# Patient Record
Sex: Female | Born: 1955 | Race: Black or African American | Hispanic: No | Marital: Single | Smoking: Former smoker
Health system: Southern US, Community
[De-identification: ages and names within clinical notes are randomized; demographics above are authoritative.]

## PROBLEM LIST (undated history)

## (undated) DIAGNOSIS — D649 Anemia, unspecified: Secondary | ICD-10-CM

## (undated) DIAGNOSIS — I739 Peripheral vascular disease, unspecified: Secondary | ICD-10-CM

## (undated) DIAGNOSIS — J45909 Unspecified asthma, uncomplicated: Secondary | ICD-10-CM

## (undated) DIAGNOSIS — G4730 Sleep apnea, unspecified: Secondary | ICD-10-CM

## (undated) DIAGNOSIS — K219 Gastro-esophageal reflux disease without esophagitis: Secondary | ICD-10-CM

## (undated) DIAGNOSIS — L309 Dermatitis, unspecified: Secondary | ICD-10-CM

## (undated) DIAGNOSIS — E1169 Type 2 diabetes mellitus with other specified complication: Secondary | ICD-10-CM

## (undated) DIAGNOSIS — E785 Hyperlipidemia, unspecified: Secondary | ICD-10-CM

## (undated) DIAGNOSIS — Z8719 Personal history of other diseases of the digestive system: Secondary | ICD-10-CM

## (undated) DIAGNOSIS — M1990 Unspecified osteoarthritis, unspecified site: Secondary | ICD-10-CM

## (undated) DIAGNOSIS — N183 Chronic kidney disease, stage 3 unspecified: Secondary | ICD-10-CM

## (undated) DIAGNOSIS — E1122 Type 2 diabetes mellitus with diabetic chronic kidney disease: Secondary | ICD-10-CM

## (undated) DIAGNOSIS — I10 Essential (primary) hypertension: Secondary | ICD-10-CM

## (undated) DIAGNOSIS — Z9641 Presence of insulin pump (external) (internal): Secondary | ICD-10-CM

## (undated) HISTORY — DX: Unspecified asthma, uncomplicated: J45.909

## (undated) HISTORY — DX: Type 2 diabetes mellitus with diabetic chronic kidney disease: E11.22

## (undated) HISTORY — DX: Chronic kidney disease, stage 3 unspecified: N18.30

## (undated) HISTORY — PX: CARDIAC CATHETERIZATION: SHX172

## (undated) HISTORY — DX: Peripheral vascular disease, unspecified: I73.9

## (undated) HISTORY — DX: Type 2 diabetes mellitus with other specified complication: E78.5

## (undated) HISTORY — PX: REPLACEMENT TOTAL KNEE: SUR1224

## (undated) HISTORY — DX: Type 2 diabetes mellitus with other specified complication: E11.69

## (undated) HISTORY — DX: Essential (primary) hypertension: I10

## (undated) HISTORY — PX: TONSILLECTOMY: SUR1361

## (undated) HISTORY — DX: Dermatitis, unspecified: L30.9

---
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
## 1987-02-25 HISTORY — PX: TOTAL ABDOMINAL HYSTERECTOMY: SHX209

## 1995-02-25 HISTORY — PX: CHOLECYSTECTOMY: SHX55

## 1998-03-23 ENCOUNTER — Emergency Department (HOSPITAL_COMMUNITY): Admission: EM | Admit: 1998-03-23 | Discharge: 1998-03-23 | Payer: Self-pay | Admitting: Emergency Medicine

## 1998-07-30 ENCOUNTER — Emergency Department (HOSPITAL_COMMUNITY): Admission: EM | Admit: 1998-07-30 | Discharge: 1998-07-30 | Payer: Self-pay | Admitting: Emergency Medicine

## 1998-09-28 ENCOUNTER — Encounter: Payer: Self-pay | Admitting: General Surgery

## 1998-09-28 ENCOUNTER — Observation Stay (HOSPITAL_COMMUNITY): Admission: RE | Admit: 1998-09-28 | Discharge: 1998-09-29 | Payer: Self-pay | Admitting: General Surgery

## 1999-04-24 ENCOUNTER — Encounter: Admission: RE | Admit: 1999-04-24 | Discharge: 1999-04-24 | Payer: Self-pay | Admitting: Family Medicine

## 1999-04-24 ENCOUNTER — Encounter: Payer: Self-pay | Admitting: Family Medicine

## 1999-04-29 ENCOUNTER — Other Ambulatory Visit: Admission: RE | Admit: 1999-04-29 | Discharge: 1999-04-29 | Payer: Self-pay | Admitting: Family Medicine

## 1999-10-17 ENCOUNTER — Encounter: Payer: Self-pay | Admitting: Family Medicine

## 1999-10-17 ENCOUNTER — Encounter: Admission: RE | Admit: 1999-10-17 | Discharge: 1999-10-17 | Payer: Self-pay | Admitting: Family Medicine

## 2004-10-25 ENCOUNTER — Emergency Department (HOSPITAL_COMMUNITY): Admission: EM | Admit: 2004-10-25 | Discharge: 2004-10-26 | Payer: Self-pay | Admitting: Emergency Medicine

## 2006-02-24 DIAGNOSIS — I519 Heart disease, unspecified: Secondary | ICD-10-CM

## 2006-02-24 DIAGNOSIS — I2510 Atherosclerotic heart disease of native coronary artery without angina pectoris: Secondary | ICD-10-CM

## 2006-02-24 DIAGNOSIS — I219 Acute myocardial infarction, unspecified: Secondary | ICD-10-CM

## 2006-02-24 HISTORY — DX: Heart disease, unspecified: I51.9

## 2006-02-24 HISTORY — PX: CORONARY STENT INTERVENTION: CATH118234

## 2006-02-24 HISTORY — DX: Atherosclerotic heart disease of native coronary artery without angina pectoris: I25.10

## 2006-02-24 HISTORY — DX: Acute myocardial infarction, unspecified: I21.9

## 2008-02-25 DIAGNOSIS — E119 Type 2 diabetes mellitus without complications: Secondary | ICD-10-CM

## 2008-02-25 HISTORY — DX: Type 2 diabetes mellitus without complications: E11.9

## 2008-02-25 HISTORY — PX: LUMBAR SPINE SURGERY: SHX701

## 2009-06-26 DIAGNOSIS — M519 Unspecified thoracic, thoracolumbar and lumbosacral intervertebral disc disorder: Secondary | ICD-10-CM | POA: Insufficient documentation

## 2011-01-20 DIAGNOSIS — I10 Essential (primary) hypertension: Secondary | ICD-10-CM | POA: Insufficient documentation

## 2011-01-20 HISTORY — DX: Essential (primary) hypertension: I10

## 2011-02-25 HISTORY — PX: CORONARY BALLOON ANGIOPLASTY: CATH118233

## 2011-03-31 DIAGNOSIS — F411 Generalized anxiety disorder: Secondary | ICD-10-CM | POA: Insufficient documentation

## 2011-09-10 DIAGNOSIS — E119 Type 2 diabetes mellitus without complications: Secondary | ICD-10-CM | POA: Insufficient documentation

## 2011-09-10 DIAGNOSIS — N1832 Chronic kidney disease, stage 3b: Secondary | ICD-10-CM | POA: Insufficient documentation

## 2012-09-09 DIAGNOSIS — E1149 Type 2 diabetes mellitus with other diabetic neurological complication: Secondary | ICD-10-CM | POA: Insufficient documentation

## 2013-02-24 DIAGNOSIS — I219 Acute myocardial infarction, unspecified: Secondary | ICD-10-CM

## 2013-02-24 HISTORY — DX: Acute myocardial infarction, unspecified: I21.9

## 2014-10-23 DIAGNOSIS — R059 Cough, unspecified: Secondary | ICD-10-CM | POA: Insufficient documentation

## 2015-02-01 DIAGNOSIS — I739 Peripheral vascular disease, unspecified: Secondary | ICD-10-CM | POA: Insufficient documentation

## 2015-02-25 HISTORY — PX: CARPAL TUNNEL RELEASE: SHX101

## 2015-03-12 DIAGNOSIS — G4733 Obstructive sleep apnea (adult) (pediatric): Secondary | ICD-10-CM | POA: Insufficient documentation

## 2015-07-06 DIAGNOSIS — R0609 Other forms of dyspnea: Secondary | ICD-10-CM | POA: Insufficient documentation

## 2015-07-06 DIAGNOSIS — R0602 Shortness of breath: Secondary | ICD-10-CM | POA: Insufficient documentation

## 2015-08-21 DIAGNOSIS — J4520 Mild intermittent asthma, uncomplicated: Secondary | ICD-10-CM | POA: Insufficient documentation

## 2015-10-12 DIAGNOSIS — K219 Gastro-esophageal reflux disease without esophagitis: Secondary | ICD-10-CM | POA: Insufficient documentation

## 2015-10-12 DIAGNOSIS — K449 Diaphragmatic hernia without obstruction or gangrene: Secondary | ICD-10-CM | POA: Insufficient documentation

## 2015-10-14 DIAGNOSIS — D3502 Benign neoplasm of left adrenal gland: Secondary | ICD-10-CM | POA: Insufficient documentation

## 2016-03-20 DIAGNOSIS — K30 Functional dyspepsia: Secondary | ICD-10-CM | POA: Insufficient documentation

## 2016-05-23 DIAGNOSIS — N183 Chronic kidney disease, stage 3 unspecified: Secondary | ICD-10-CM | POA: Insufficient documentation

## 2016-05-23 DIAGNOSIS — N179 Acute kidney failure, unspecified: Secondary | ICD-10-CM | POA: Insufficient documentation

## 2016-08-06 DIAGNOSIS — M5416 Radiculopathy, lumbar region: Secondary | ICD-10-CM | POA: Insufficient documentation

## 2016-10-08 DIAGNOSIS — R079 Chest pain, unspecified: Secondary | ICD-10-CM | POA: Insufficient documentation

## 2016-10-08 DIAGNOSIS — R072 Precordial pain: Secondary | ICD-10-CM | POA: Insufficient documentation

## 2016-12-29 DIAGNOSIS — M7542 Impingement syndrome of left shoulder: Secondary | ICD-10-CM | POA: Insufficient documentation

## 2017-01-01 DIAGNOSIS — I5032 Chronic diastolic (congestive) heart failure: Secondary | ICD-10-CM | POA: Insufficient documentation

## 2017-01-07 DIAGNOSIS — M159 Polyosteoarthritis, unspecified: Secondary | ICD-10-CM | POA: Insufficient documentation

## 2017-01-07 DIAGNOSIS — R5382 Chronic fatigue, unspecified: Secondary | ICD-10-CM | POA: Insufficient documentation

## 2017-02-24 DIAGNOSIS — I509 Heart failure, unspecified: Secondary | ICD-10-CM

## 2017-02-24 DIAGNOSIS — I5032 Chronic diastolic (congestive) heart failure: Secondary | ICD-10-CM

## 2017-02-24 HISTORY — DX: Chronic diastolic (congestive) heart failure: I50.32

## 2017-02-24 HISTORY — DX: Heart failure, unspecified: I50.9

## 2017-09-02 DIAGNOSIS — K909 Intestinal malabsorption, unspecified: Secondary | ICD-10-CM | POA: Insufficient documentation

## 2017-09-02 DIAGNOSIS — R197 Diarrhea, unspecified: Secondary | ICD-10-CM | POA: Insufficient documentation

## 2017-10-11 DIAGNOSIS — G5601 Carpal tunnel syndrome, right upper limb: Secondary | ICD-10-CM | POA: Insufficient documentation

## 2017-12-22 HISTORY — PX: LEFT HEART CATH AND CORONARY ANGIOGRAPHY: CATH118249

## 2017-12-22 HISTORY — PX: LOWER EXTREMITY ANGIOGRAM: SHX5955

## 2017-12-22 HISTORY — PX: LOWER EXTREMITY INTERVENTION: CATH118252

## 2018-04-25 DIAGNOSIS — Z96642 Presence of left artificial hip joint: Secondary | ICD-10-CM

## 2018-04-25 HISTORY — PX: TOTAL HIP ARTHROPLASTY: SHX124

## 2018-04-25 HISTORY — DX: Presence of left artificial hip joint: Z96.642

## 2018-05-05 DIAGNOSIS — M1612 Unilateral primary osteoarthritis, left hip: Secondary | ICD-10-CM | POA: Insufficient documentation

## 2018-05-06 DIAGNOSIS — Z96642 Presence of left artificial hip joint: Secondary | ICD-10-CM | POA: Insufficient documentation

## 2018-05-11 DIAGNOSIS — I25118 Atherosclerotic heart disease of native coronary artery with other forms of angina pectoris: Secondary | ICD-10-CM | POA: Insufficient documentation

## 2018-05-11 DIAGNOSIS — E785 Hyperlipidemia, unspecified: Secondary | ICD-10-CM | POA: Insufficient documentation

## 2018-05-11 DIAGNOSIS — I2510 Atherosclerotic heart disease of native coronary artery without angina pectoris: Secondary | ICD-10-CM | POA: Insufficient documentation

## 2018-05-11 DIAGNOSIS — N182 Chronic kidney disease, stage 2 (mild): Secondary | ICD-10-CM | POA: Insufficient documentation

## 2018-05-11 DIAGNOSIS — E1169 Type 2 diabetes mellitus with other specified complication: Secondary | ICD-10-CM | POA: Insufficient documentation

## 2018-05-11 DIAGNOSIS — J45909 Unspecified asthma, uncomplicated: Secondary | ICD-10-CM | POA: Insufficient documentation

## 2018-05-11 DIAGNOSIS — J189 Pneumonia, unspecified organism: Secondary | ICD-10-CM | POA: Insufficient documentation

## 2018-07-01 DIAGNOSIS — R110 Nausea: Secondary | ICD-10-CM | POA: Insufficient documentation

## 2018-12-29 DIAGNOSIS — D519 Vitamin B12 deficiency anemia, unspecified: Secondary | ICD-10-CM | POA: Insufficient documentation

## 2018-12-29 DIAGNOSIS — D509 Iron deficiency anemia, unspecified: Secondary | ICD-10-CM | POA: Insufficient documentation

## 2019-02-25 DIAGNOSIS — J449 Chronic obstructive pulmonary disease, unspecified: Secondary | ICD-10-CM

## 2019-02-25 HISTORY — DX: Chronic obstructive pulmonary disease, unspecified: J44.9

## 2019-03-28 DIAGNOSIS — R490 Dysphonia: Secondary | ICD-10-CM | POA: Insufficient documentation

## 2019-07-01 DIAGNOSIS — R911 Solitary pulmonary nodule: Secondary | ICD-10-CM | POA: Insufficient documentation

## 2019-07-04 HISTORY — PX: TRANSTHORACIC ECHOCARDIOGRAM: SHX275

## 2019-07-07 HISTORY — PX: NM MYOVIEW LTD: HXRAD82

## 2019-09-22 DIAGNOSIS — N183 Chronic kidney disease, stage 3 unspecified: Secondary | ICD-10-CM | POA: Insufficient documentation

## 2019-09-22 DIAGNOSIS — E1122 Type 2 diabetes mellitus with diabetic chronic kidney disease: Secondary | ICD-10-CM | POA: Insufficient documentation

## 2019-09-27 DIAGNOSIS — M19011 Primary osteoarthritis, right shoulder: Secondary | ICD-10-CM | POA: Insufficient documentation

## 2019-10-26 DIAGNOSIS — E278 Other specified disorders of adrenal gland: Secondary | ICD-10-CM | POA: Insufficient documentation

## 2019-10-26 DIAGNOSIS — E1136 Type 2 diabetes mellitus with diabetic cataract: Secondary | ICD-10-CM | POA: Insufficient documentation

## 2019-11-25 HISTORY — PX: TOTAL SHOULDER ARTHROPLASTY: SHX126

## 2019-12-23 DIAGNOSIS — Z96611 Presence of right artificial shoulder joint: Secondary | ICD-10-CM | POA: Insufficient documentation

## 2020-02-29 ENCOUNTER — Encounter: Payer: Self-pay | Admitting: Allergy and Immunology

## 2020-02-29 ENCOUNTER — Other Ambulatory Visit: Payer: Self-pay

## 2020-02-29 ENCOUNTER — Ambulatory Visit (INDEPENDENT_AMBULATORY_CARE_PROVIDER_SITE_OTHER): Payer: Medicare HMO | Admitting: Allergy and Immunology

## 2020-02-29 VITALS — BP 132/64 | HR 80 | Temp 96.5°F | Resp 16 | Ht 65.12 in | Wt 254.4 lb

## 2020-02-29 DIAGNOSIS — J3089 Other allergic rhinitis: Secondary | ICD-10-CM | POA: Insufficient documentation

## 2020-02-29 DIAGNOSIS — J449 Chronic obstructive pulmonary disease, unspecified: Secondary | ICD-10-CM | POA: Insufficient documentation

## 2020-02-29 DIAGNOSIS — J302 Other seasonal allergic rhinitis: Secondary | ICD-10-CM | POA: Insufficient documentation

## 2020-02-29 DIAGNOSIS — H1013 Acute atopic conjunctivitis, bilateral: Secondary | ICD-10-CM

## 2020-02-29 DIAGNOSIS — H1010 Acute atopic conjunctivitis, unspecified eye: Secondary | ICD-10-CM | POA: Insufficient documentation

## 2020-02-29 DIAGNOSIS — J4489 Other specified chronic obstructive pulmonary disease: Secondary | ICD-10-CM | POA: Insufficient documentation

## 2020-02-29 MED ORDER — AZELASTINE HCL 0.1 % NA SOLN: NASAL | 5 refills | Status: DC

## 2020-02-29 MED ORDER — LEVOCETIRIZINE DIHYDROCHLORIDE 5 MG PO TABS: 5.0000 mg | ORAL_TABLET | Freq: Every day | ORAL | 5 refills | Status: DC | PRN

## 2020-02-29 MED ORDER — EPINEPHRINE 0.3 MG/0.3ML IJ SOAJ: INTRAMUSCULAR | 3 refills | Status: DC

## 2020-02-29 MED ORDER — SPIRIVA RESPIMAT 1.25 MCG/ACT IN AERS: 2.0000 | INHALATION_SPRAY | Freq: Every day | RESPIRATORY_TRACT | 5 refills | Status: DC

## 2020-02-29 NOTE — Progress Notes (Signed)
New Patient Note  RE: Katrina Vega MRN: [id number] DOB: 11-Jan-1956 Date of Office Visit: 02/29/2020  Referring provider: No ref. provider found Primary care provider: Patient, No Pcp Per  Chief Complaint: Allergic Rhinitis    History of present illness: Katrina Vega is a 65 y.o. female presenting today for evaluation of rhinitis and asthma. She reports that she has experienced nasal congestion, rhinorrhea, sneezing, postnasal drainage, nasal pruritus, ocular pruritus, and occasional sinus pressure. The symptoms occur year-round but are more frequent and severe during the springtime and in the summer.  She attempts to control the symptoms with cetirizine 10 mg twice daily.  She has taken the cetirizine over the past 6 months.  In addition, she uses Nasacort AQ, however despite the cetirizine and Nasacort she continues to experience unacceptable symptoms.  She was started on aeroallergen immunotherapy injections this past summer in [place]. She is undergoing buildup injections and is interested in continuing this therapy. She brought her vials with her from [place]. Katrina Vega has a history of asthma/COPD.  She quit smoking approximately 23 years ago.  She currently takes Symbicort 160-4.5 micro grams, 2 inhalations twice daily.  She does not use a spacer device with HFA inhalers.  She reports that she has recently been exposed to secondhand cigarette smoke and as such has been requiring albuterol rescue once or twice daily.  She has been experiencing limitations in normal daily activities due to getting "winded really quick."  She denies nocturnal awakenings due to lower respiratory symptoms.  Assessment and plan: Other allergic rhinitis  The patient will resume aeroallergen immunotherapy under our care using the allergen vaccines and protocol provided by her previous allergist. She has agreed to have access to epinephrine autoinjector.  For now, continue appropriate allergen avoidance  measures.  A prescription has been provided for levocetirizine(Xyzal), 5 mg daily as needed.  To avoid diminishing benefit with daily use (tachyphylaxis) of second generation antihistamine, consider alternating every few months between fexofenadine (Allegra) and levocetirizine (Xyzal).  A prescription has been provided for azelastine nasal spray, 1-2 sprays per nostril 2 times daily as needed. Proper nasal spray technique has been discussed and demonstrated.   If needed, may add Nasacort AQ.  Nasal saline spray (i.e., Simply Saline) or nasal saline lavage (i.e., NeilMed) is recommended as needed and prior to medicated nasal sprays.  Allergic conjunctivitis  Treatment plan as outlined above for allergic rhinitis.  A prescription has been provided for generic Pataday, one drop per eye daily as needed.  If insurance does not cover this medication, medicated allergy eyedrops may be purchased over-the-counter as Restaurant manager, fast food.  I have also recommended eye lubricant drops (i.e., Natural Tears) as needed.  Asthma-COPD overlap syndrome ([place]) Currently with suboptimal control.  Continue Symbicort 160-4.5 micro grams, 2 inhalations twice daily.  To maximize pulmonary deposition, a spacer has been provided along with instructions for its proper administration with an HFA inhaler.  A prescription has been provided for Spiriva Respimat 1.25 g, 2 inhalations daily.  Subjective and objective measures of pulmonary function will be followed and the treatment plan will be adjusted accordingly.   Meds ordered this encounter  Medications  . EPINEPHrine (EPIPEN 2-PAK) 0.3 mg/0.3 mL IJ SOAJ injection    Sig: Use as directed for severe allergic reactions    Dispense:  2 each    Refill:  3  . levocetirizine (XYZAL) 5 MG tablet    Sig: Take 1 tablet (5 mg total) by  mouth daily as needed for allergies.    Dispense:  30 tablet    Refill:  5  . azelastine (ASTELIN) 0.1 % nasal  spray    Sig: 1-2 sprays per nostril 2 times daily as needed    Dispense:  30 mL    Refill:  5  . Tiotropium Bromide Monohydrate (SPIRIVA RESPIMAT) 1.25 MCG/ACT AERS    Sig: Inhale 2 puffs into the lungs daily.    Dispense:  1 each    Refill:  5    Diagnostics: Spirometry: FVC was 2.09 L and FEV1 was 1.77 L (87% predicted) with an FEV1 ratio of 109%.  This study was performed while the patient was asymptomatic.  Please see scanned spirometry results for details.   Physical examination: Blood pressure 132/64, pulse 80, temperature (!) 96.5 F (35.8 C), temperature source Tympanic, resp. rate 16, height 5' 5.12" (1.654 m), weight 254 lb 6.6 oz (115.4 kg), SpO2 97 %.  General: Alert, interactive, obese, in no acute distress. HEENT: TMs pearly gray, turbinates moderately edematous without discharge, post-pharynx moderately erythematous. Neck: Supple without lymphadenopathy. Lungs: Clear to auscultation without wheezing, rhonchi or rales. CV: Normal S1, S2 without murmurs. Abdomen: Nondistended, nontender. Skin: Warm and dry, without lesions or rashes. Extremities:  No clubbing, cyanosis or edema. Neuro:   Grossly intact.  Review of systems:  Review of systems negative except as noted in HPI / PMHx or noted below: Review of Systems  Constitutional: Negative.   HENT: Negative.   Eyes: Negative.   Respiratory: Negative.   Cardiovascular: Negative.   Gastrointestinal: Negative.   Genitourinary: Negative.   Musculoskeletal: Negative.   Skin: Negative.   Neurological: Negative.   Endo/Heme/Allergies: Negative.   Psychiatric/Behavioral: Negative.     Past medical history:  Past Medical History:  Diagnosis Date  . Asthma   . Congestive heart failure (CHF) ([place]) 2019  . COPD (chronic obstructive pulmonary disease) ([place]) 2021  . Diabetes ([place]) 2010  . Eczema   . Heart attack ([place]) 2015  . Heart disease 2008  . 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 APPENDECTOMY  1974  . CARPAL TUNNEL RELEASE  2017  . [place]  2010  . REPLACEMENT TOTAL KNEE  2017 and 2018  . TONSILLECTOMY    . TOTAL ABDOMINAL HYSTERECTOMY  1989  . TOTAL HIP ARTHROPLASTY  04/2018  . TOTAL SHOULDER ARTHROPLASTY  11/2019    Family history: Family History  Problem Relation Age of Onset  . Eczema Grandson   . Allergic rhinitis Neg Hx   . Angioedema Neg Hx   . Asthma Neg Hx   . Atopy Neg Hx   . Immunodeficiency Neg Hx   . Urticaria Neg Hx     Social history: Social History   Socioeconomic History  . Marital status: Single    Spouse name: Not on file  . Number of children: Not on file  . Years of education: Not on file  . Highest education level: Not on file  Occupational History  . Not on file  Tobacco Use  . Smoking status: Former Research scientist (life sciences)  . Smokeless tobacco: Never Used  Vaping Use  . Vaping Use: Never used  Substance and Sexual Activity  . Alcohol use: Not on file  . Drug use: Not on file  . Sexual activity: Not on file  Other Topics Concern  . Not on file  Social  History Narrative  . Not on file   Social Determinants of Health   Financial Resource Strain: Not on file  Food Insecurity: Not on file  Transportation Needs: Not on file  Physical Activity: Not on file  Stress: Not on file  Social Connections: Not on file  Intimate Partner Violence: Not on file    Environmental History: The patient lives in an apartment with carpeting in the bedroom and central air/heat.  There is no known mold/water damage in the home.  There are no pets in the home.  She is a former smoker having quit 23 years ago.   Current Outpatient Medications  Medication Sig Dispense Refill  . albuterol (VENTOLIN HFA) 108 (90 Base) MCG/ACT inhaler Inhale 2 puffs into the lungs every 6 (six) hours as needed.    Marland Kitchen aspirin 81 MG EC tablet Take 81 mg by mouth daily.    Marland Kitchen atorvastatin (LIPITOR)  80 MG tablet Take 80 mg by mouth daily.    Marland Kitchen azelastine (ASTELIN) 0.1 % nasal spray 1-2 sprays per nostril 2 times daily as needed 30 mL 5  . budesonide-formoterol (SYMBICORT) 160-4.5 MCG/ACT inhaler Inhale 2 puffs into the lungs in the morning and at bedtime.    . carisoprodol (SOMA) 350 MG tablet Take by mouth as needed.    . cetirizine (ZYRTEC) 10 MG tablet Take 10 mg by mouth in the morning and at bedtime.    . clobetasol (TEMOVATE) 0.05 % external solution Apply topically as needed.    . clopidogrel (PLAVIX) 75 MG tablet Take 75 mg by mouth.    . Continuous Blood Gluc Sensor (DEXCOM G6 SENSOR) MISC USE 1 SENSOR AS NEEDED CHANGE EVERY 10 DAYS    . cyclobenzaprine (FLEXERIL) 5 MG tablet Take by mouth as needed.    . diclofenac Sodium (VOLTAREN) 1 % GEL Apply topically daily.    Marland Kitchen EPINEPHrine (EPIPEN 2-PAK) 0.3 mg/0.3 mL IJ SOAJ injection Use as directed for severe allergic reactions 2 each 3  . ferrous sulfate 325 (65 FE) MG tablet Take 325 mg by mouth in the morning, at noon, and at bedtime.    . furosemide (LASIX) 40 MG tablet Take 40 mg by mouth daily.    Marland Kitchen gabapentin (NEURONTIN) 300 MG capsule Take 300 mg by mouth in the morning, at noon, and at bedtime.    Marland Kitchen glucagon 1 MG injection Inject into the muscle as needed.    Marland Kitchen glucose 4 GM chewable tablet Chew by mouth as needed.    Marland Kitchen HYDROcodone-acetaminophen (NORCO/VICODIN) 5-325 MG tablet as needed.    . insulin regular human CONCENTRATED (HUMULIN R) 500 UNIT/ML kwikpen Inject into the skin daily.    . isosorbide mononitrate (IMDUR) 60 MG 24 hr tablet Take 60 mg by mouth daily.    Marland Kitchen levocetirizine (XYZAL) 5 MG tablet Take 1 tablet (5 mg total) by mouth daily as needed for allergies. 30 tablet 5  . lisinopril (ZESTRIL) 20 MG tablet Take 20 mg by mouth daily.    . magnesium oxide (MAG-OX) 400 MG tablet Take 400 mg by mouth daily.    . metFORMIN (GLUCOPHAGE) 500 MG tablet Take 500 mg by mouth in the morning and at bedtime.    . metoprolol  tartrate (LOPRESSOR) 50 MG tablet Take 50 mg by mouth every 12 (twelve) hours.    . nitroGLYCERIN (NITROSTAT) 0.4 MG SL tablet Place under the tongue as needed.    . ondansetron (ZOFRAN) 4 MG tablet Take by mouth as needed.    Marland Kitchen  pantoprazole (PROTONIX) 40 MG tablet Take 40 mg by mouth in the morning and at bedtime.    . polyethylene glycol powder (GLYCOLAX/MIRALAX) 17 GM/SCOOP powder Take by mouth as needed.    . rosuvastatin (CRESTOR) 40 MG tablet Take 40 mg by mouth at bedtime.    . Semaglutide, 1 MG/DOSE, (OZEMPIC, 1 MG/DOSE,) 4 MG/3ML SOPN Inject into the skin once a week.    . senna-docusate (SENOKOT-S) 8.6-50 MG tablet Take 2 tablets by mouth as needed.    . Tiotropium Bromide Monohydrate (SPIRIVA RESPIMAT) 1.25 MCG/ACT AERS Inhale 2 puffs into the lungs daily. 1 each 5  . triamcinolone (NASACORT) 55 MCG/ACT AERO nasal inhaler as needed.     No current facility-administered medications for this visit.    Known medication allergies: Allergies  Allergen Reactions  . Contrast Media [Iodinated Diagnostic Agents] Other (See Comments) and Shortness Of Breath    sob, chest tight  . Morphine Itching    I appreciate the opportunity to take part in Kiffany's care. Please do not hesitate to contact me with questions.  Sincerely,   R. Jorene Guest, MD

## 2020-02-29 NOTE — Assessment & Plan Note (Signed)
Currently with suboptimal control.  Continue Symbicort 160-4.5 micro grams, 2 inhalations twice daily.  To maximize pulmonary deposition, a spacer has been provided along with instructions for its proper administration with an HFA inhaler.  A prescription has been provided for Spiriva Respimat 1.25 g, 2 inhalations daily.  Subjective and objective measures of pulmonary function will be followed and the treatment plan will be adjusted accordingly.

## 2020-02-29 NOTE — Assessment & Plan Note (Signed)
   Treatment plan as outlined above for allergic rhinitis.  A prescription has been provided for generic Pataday, one drop per eye daily as needed.  If insurance does not cover this medication, medicated allergy eyedrops may be purchased over-the-counter as Pataday Extra Strength or Zaditor.  I have also recommended eye lubricant drops (i.e., Natural Tears) as needed. 

## 2020-02-29 NOTE — Patient Instructions (Addendum)
Other allergic rhinitis  The patient will resume aeroallergen immunotherapy under our care using the allergen vaccines and protocol provided by her previous allergist. She has agreed to have access to epinephrine autoinjector.  For now, continue appropriate allergen avoidance measures.  A prescription has been provided for levocetirizine(Xyzal), 5 mg daily as needed.  To avoid diminishing benefit with daily use (tachyphylaxis) of second generation antihistamine, consider alternating every few months between fexofenadine (Allegra) and levocetirizine (Xyzal).  A prescription has been provided for azelastine nasal spray, 1-2 sprays per nostril 2 times daily as needed. Proper nasal spray technique has been discussed and demonstrated.   If needed, may add Nasacort AQ.  Nasal saline spray (i.e., Simply Saline) or nasal saline lavage (i.e., NeilMed) is recommended as needed and prior to medicated nasal sprays.  Allergic conjunctivitis  Treatment plan as outlined above for allergic rhinitis.  A prescription has been provided for generic Pataday, one drop per eye daily as needed.  If insurance does not cover this medication, medicated allergy eyedrops may be purchased over-the-counter as Art therapist.  I have also recommended eye lubricant drops (i.e., Natural Tears) as needed.  Asthma-COPD overlap syndrome ([facility]) Currently with suboptimal control.  Continue Symbicort 160-4.5 micro grams, 2 inhalations twice daily.  To maximize pulmonary deposition, a spacer has been provided along with instructions for its proper administration with an HFA inhaler.  A prescription has been provided for Spiriva Respimat 1.25 g, 2 inhalations daily.  Subjective and objective measures of pulmonary function will be followed and the treatment plan will be adjusted accordingly.   Return in about 2 months (around 04/28/2020), or if symptoms worsen or fail to improve.

## 2020-02-29 NOTE — Assessment & Plan Note (Signed)
   The patient will resume aeroallergen immunotherapy under our care using the allergen vaccines and protocol provided by her previous allergist. She has agreed to have access to epinephrine autoinjector.  For now, continue appropriate allergen avoidance measures.  A prescription has been provided for levocetirizine(Xyzal), 5 mg daily as needed.  To avoid diminishing benefit with daily use (tachyphylaxis) of second generation antihistamine, consider alternating every few months between fexofenadine (Allegra) and levocetirizine (Xyzal).  A prescription has been provided for azelastine nasal spray, 1-2 sprays per nostril 2 times daily as needed. Proper nasal spray technique has been discussed and demonstrated.   If needed, may add Nasacort AQ.  Nasal saline spray (i.e., Simply Saline) or nasal saline lavage (i.e., NeilMed) is recommended as needed and prior to medicated nasal sprays.

## 2020-03-05 ENCOUNTER — Ambulatory Visit (INDEPENDENT_AMBULATORY_CARE_PROVIDER_SITE_OTHER): Payer: Medicare HMO | Admitting: *Deleted

## 2020-03-05 ENCOUNTER — Other Ambulatory Visit: Payer: Self-pay

## 2020-03-05 DIAGNOSIS — J309 Allergic rhinitis, unspecified: Secondary | ICD-10-CM

## 2020-03-05 NOTE — Progress Notes (Signed)
Immunotherapy   Patient Details  Name: Katrina Vega MRN: [id number] Date of Birth: [birth year]  [birth year]  [name]  Patient brought her allergy vials from Dr. Mingo Amber in [place] to continue her injections in our office. She received .15mL of Roach, Mite, Mold in the [place] and 0.31mL of PL & Sorrel, Weed, Cat, and Dog in the [place] today. Patient was behind in her injections so I called and spoke with Kevan Rosebush at the physician's office and she informed to back the patient down to .41mL and she will build up on Schedule B every 2 weeks until she is back to .58mL then she will continue .40mL every 2 weeks until it is time to order new vials.  Consent signed and patient instructions given.   Katrina Vega 03/05/2020, 12:16 PM

## 2020-03-14 ENCOUNTER — Ambulatory Visit (INDEPENDENT_AMBULATORY_CARE_PROVIDER_SITE_OTHER): Payer: Medicare HMO

## 2020-03-14 DIAGNOSIS — J309 Allergic rhinitis, unspecified: Secondary | ICD-10-CM | POA: Diagnosis not present

## 2020-03-14 DIAGNOSIS — I5032 Chronic diastolic (congestive) heart failure: Secondary | ICD-10-CM | POA: Diagnosis not present

## 2020-03-14 DIAGNOSIS — K219 Gastro-esophageal reflux disease without esophagitis: Secondary | ICD-10-CM | POA: Diagnosis not present

## 2020-03-14 DIAGNOSIS — E782 Mixed hyperlipidemia: Secondary | ICD-10-CM | POA: Diagnosis not present

## 2020-03-14 DIAGNOSIS — E119 Type 2 diabetes mellitus without complications: Secondary | ICD-10-CM | POA: Diagnosis not present

## 2020-03-14 DIAGNOSIS — J410 Simple chronic bronchitis: Secondary | ICD-10-CM | POA: Diagnosis not present

## 2020-03-14 DIAGNOSIS — M19011 Primary osteoarthritis, right shoulder: Secondary | ICD-10-CM | POA: Diagnosis not present

## 2020-03-14 DIAGNOSIS — I10 Essential (primary) hypertension: Secondary | ICD-10-CM | POA: Diagnosis not present

## 2020-03-16 ENCOUNTER — Other Ambulatory Visit: Payer: Self-pay | Admitting: Orthopaedic Surgery

## 2020-03-16 DIAGNOSIS — M25511 Pain in right shoulder: Secondary | ICD-10-CM | POA: Diagnosis not present

## 2020-03-21 ENCOUNTER — Other Ambulatory Visit: Payer: Self-pay | Admitting: Physician Assistant

## 2020-03-21 DIAGNOSIS — Z1231 Encounter for screening mammogram for malignant neoplasm of breast: Secondary | ICD-10-CM

## 2020-03-23 ENCOUNTER — Other Ambulatory Visit: Payer: Self-pay

## 2020-03-23 ENCOUNTER — Encounter: Payer: Self-pay | Admitting: Cardiology

## 2020-03-23 ENCOUNTER — Ambulatory Visit: Payer: Medicare HMO | Admitting: Cardiology

## 2020-03-23 VITALS — BP 150/73 | HR 78 | Temp 97.9°F | Ht 65.0 in | Wt 260.0 lb

## 2020-03-23 DIAGNOSIS — I739 Peripheral vascular disease, unspecified: Secondary | ICD-10-CM

## 2020-03-23 DIAGNOSIS — I5043 Acute on chronic combined systolic (congestive) and diastolic (congestive) heart failure: Secondary | ICD-10-CM | POA: Diagnosis not present

## 2020-03-23 DIAGNOSIS — E7800 Pure hypercholesterolemia, unspecified: Secondary | ICD-10-CM | POA: Diagnosis not present

## 2020-03-23 DIAGNOSIS — Z794 Long term (current) use of insulin: Secondary | ICD-10-CM

## 2020-03-23 DIAGNOSIS — E1122 Type 2 diabetes mellitus with diabetic chronic kidney disease: Secondary | ICD-10-CM | POA: Diagnosis not present

## 2020-03-23 DIAGNOSIS — I10 Essential (primary) hypertension: Secondary | ICD-10-CM | POA: Diagnosis not present

## 2020-03-23 DIAGNOSIS — I2510 Atherosclerotic heart disease of native coronary artery without angina pectoris: Secondary | ICD-10-CM | POA: Diagnosis not present

## 2020-03-23 DIAGNOSIS — Z86718 Personal history of other venous thrombosis and embolism: Secondary | ICD-10-CM | POA: Insufficient documentation

## 2020-03-23 DIAGNOSIS — N1831 Chronic kidney disease, stage 3a: Secondary | ICD-10-CM | POA: Diagnosis not present

## 2020-03-23 NOTE — Progress Notes (Signed)
Primary Physician/Referring:  Patient, No Pcp Per  Patient ID: Katrina Vega, female    DOB: 1955-07-09, 65 y.o.   MRN: [id number]  Chief Complaint  Patient presents with  . Congestive Heart Failure  . Coronary Artery Disease  . Peripheral artery disease    Establish care   HPI:    Katrina Vega  is a 65 y.o. Female patient with coronary artery disease and history of PCI to the LAD and ramus in 2008 with implantation of Taxus stents and NSTEMI SP RCA PCI in 2035, chronic diastolic heart failure, hyperlipidemia, hypertension, diabetes mellitus with stage III chronic kidney disease, morbid obesity, OSA unable to tolerate CPAP, chronic renal insufficiency, peripheral arterial disease with right posterior tibial DES stent placed in 2008 and bilateral SFA stenting sometime in 2016 and atherectomy for ISR in 2019 is now referred to me to establish cardiac care.   Patient underwent right shoulder arthroplasty on 12/23/2019 in [place].  She moved to [facility] to be closer to her daughter.  She has chronic shortness of breath and dyspnea on exertion.  After recent move she has noticed slight worsening dyspnea but overall states that she is doing well.  She has not had any chest pain or palpitations, symptoms of claudication especially right calf is very mild and stable.  Past Medical History:  Diagnosis Date  . Asthma   . Congestive heart failure (CHF) ([place]) 2019  . COPD (chronic obstructive pulmonary disease) ([place]) 2021  . Diabetes ([place]) 2010  . Eczema   . Heart attack ([place]) 2015  . Heart disease 2008  . History of left hip replacement 04/2018   Past Surgical History:  Procedure Laterality Date  . APPENDECTOMY  1974  . CARPAL TUNNEL RELEASE  2017  . [place]  2010  . REPLACEMENT TOTAL KNEE  2017 and 2018  . TONSILLECTOMY    . TOTAL ABDOMINAL HYSTERECTOMY  1989  . TOTAL HIP ARTHROPLASTY  04/2018  . TOTAL SHOULDER ARTHROPLASTY  11/2019   Family History  Problem  Relation Age of Onset  . Eczema Grandson   . Allergic rhinitis Neg Hx   . Angioedema Neg Hx   . Asthma Neg Hx   . Atopy Neg Hx   . Immunodeficiency Neg Hx   . Urticaria Neg Hx     Social History   Tobacco Use  . Smoking status: Former Research scientist (life sciences)  . Smokeless tobacco: Never Used  Substance Use Topics  . Alcohol use: Not on file   Marital Status: Single  ROS  Review of Systems  Cardiovascular: Positive for claudication and dyspnea on exertion. Negative for chest pain and leg swelling.  Musculoskeletal: Positive for arthritis.  Gastrointestinal: Negative for melena.   Objective  Blood pressure (!) 150/73, pulse 78, temperature 97.9 F (36.6 C), height 5' 5"  (1.651 m), weight 260 lb (117.9 kg), SpO2 98 %.  Vitals with BMI 03/23/2020 02/29/2020  Height 5' 5"  5' 5.118"  Weight 260 lbs 254 lbs 7 oz  BMI 59.74 16.38  Systolic 453 646  Diastolic 73 64  Pulse 78 80     Physical Exam Constitutional:      Comments: Morbidly obese in no acute distress.  Cardiovascular:     Rate and Rhythm: Normal rate and regular rhythm.     Pulses:          Carotid pulses are 2+ on the right side and 2+ on the left side.      Radial pulses are  2+ on the right side and 2+ on the left side.       Dorsalis pedis pulses are 2+ on the right side and 1+ on the left side.       Posterior tibial pulses are 0 on the right side and 0 on the left side.     Heart sounds: Normal heart sounds. No murmur heard. No gallop.      Comments: Femoral and popliteal pulse difficult to feel due to patient's body habitus.  No leg edema. JVD difficult to see due to short neck. Pulmonary:     Effort: Pulmonary effort is normal.     Breath sounds: Normal breath sounds.  Abdominal:     General: Bowel sounds are normal.     Palpations: Abdomen is soft.     Comments: Obese. Pannus present    Laboratory examination:   External labs:   Labs 03/15/2020:  Total cholesterol 112, triglycerides 79, HDL 34, LDL 52.  Serum  glucose 98 mg, BUN 16, creatinine 1.10, EGFR 52/61 mL.  Sodium 142, potassium 3.9, CMP otherwise normal.  Hb 12.6/HCT 40.0, WBC 6.5, platelets 407, mildly elevated.  Indicis are normal.  A1c 6.4%.  TSH normal at 1.80.  K 3.7 11/29/2019  CL 106 11/29/2019  CO2 25 11/29/2019  BUN 13 11/29/2019  CREATININE 1.29 (H) 11/29/2019  CALCIUM 9.6 11/29/2019   Lab Results  Component Value Date  HCT 32.4 (L) 11/29/2019  MCV 78.8 (L) 11/29/2019  PLT 445 (H) 11/29/2019    Labs 10/20/2019:  TSH normal, free T4 mildly elevated at 1.80 (0.60-1.60).  A1c 7.1%.  BUN 14, creatinine 1.40, EGFR 45 mill, potassium 3.8, sodium 140.  Total cholesterol 89, triglycerides 136, HDL 27, LDL 35.  Medications and allergies   Allergies  Allergen Reactions  . Contrast Media [Iodinated Diagnostic Agents] Shortness Of Breath and Other (See Comments)    sob, chest tight  . Morphine Itching  . Statins Diarrhea and Other (See Comments)    nausea, diarrhea, myalgias     Outpatient Medications Prior to Visit  Medication Sig Dispense Refill  . albuterol (VENTOLIN HFA) 108 (90 Base) MCG/ACT inhaler Inhale 2 puffs into the lungs every 6 (six) hours as needed.    Marland Kitchen aspirin 81 MG EC tablet Take 81 mg by mouth daily.    Marland Kitchen azelastine (ASTELIN) 0.1 % nasal spray 1-2 sprays per nostril 2 times daily as needed 30 mL 5  . budesonide-formoterol (SYMBICORT) 160-4.5 MCG/ACT inhaler Inhale 2 puffs into the lungs in the morning and at bedtime.    . cetirizine (ZYRTEC) 10 MG tablet Take 10 mg by mouth in the morning and at bedtime.    . clobetasol (TEMOVATE) 0.05 % external solution Apply topically as needed.    . clopidogrel (PLAVIX) 75 MG tablet Take 75 mg by mouth.    . Continuous Blood Gluc Sensor (DEXCOM G6 SENSOR) MISC USE 1 SENSOR AS NEEDED CHANGE EVERY 10 DAYS    . EPINEPHrine (EPIPEN 2-PAK) 0.3 mg/0.3 mL IJ SOAJ injection Use as directed for severe allergic reactions 2 each 3  . ferrous sulfate 325 (65 FE) MG tablet  Take 325 mg by mouth in the morning, at noon, and at bedtime.    . gabapentin (NEURONTIN) 300 MG capsule Take 300 mg by mouth in the morning, at noon, and at bedtime.    Marland Kitchen glucagon 1 MG injection Inject into the muscle as needed.    Marland Kitchen glucose 4 GM chewable tablet Chew by mouth  as needed.    . insulin regular human CONCENTRATED (HUMULIN R) 500 UNIT/ML kwikpen Inject 80 Units into the skin daily.    . isosorbide mononitrate (IMDUR) 60 MG 24 hr tablet Take 60 mg by mouth daily.    Marland Kitchen ketotifen (ZADITOR) 0.025 % ophthalmic solution 1 drop 2 (two) times daily.    Marland Kitchen levocetirizine (XYZAL) 5 MG tablet Take 1 tablet (5 mg total) by mouth daily as needed for allergies. 30 tablet 5  . lisinopril (ZESTRIL) 20 MG tablet Take 20 mg by mouth daily.    . magnesium oxide (MAG-OX) 400 MG tablet Take 400 mg by mouth daily.    . meloxicam (MOBIC) 15 MG tablet Take 15 mg by mouth daily.    . metFORMIN (GLUCOPHAGE) 500 MG tablet Take 500 mg by mouth in the morning and at bedtime.    . metoprolol tartrate (LOPRESSOR) 50 MG tablet Take 50 mg by mouth every 12 (twelve) hours.    . nitroGLYCERIN (NITROSTAT) 0.4 MG SL tablet Place under the tongue as needed.    . pantoprazole (PROTONIX) 40 MG tablet Take 40 mg by mouth in the morning and at bedtime.    . polyethylene glycol powder (GLYCOLAX/MIRALAX) 17 GM/SCOOP powder Take by mouth as needed.    Marland Kitchen Respiratory Therapy Supplies (CARETOUCH 2 CPAP HOSE HANGER) MISC by Does not apply route.    . rosuvastatin (CRESTOR) 40 MG tablet Take 40 mg by mouth at bedtime.    . Semaglutide, 1 MG/DOSE, (OZEMPIC, 1 MG/DOSE,) 4 MG/3ML SOPN Inject into the skin once a week.    . senna-docusate (SENOKOT-S) 8.6-50 MG tablet Take 2 tablets by mouth as needed.    . Tiotropium Bromide Monohydrate (SPIRIVA RESPIMAT) 1.25 MCG/ACT AERS Inhale 2 puffs into the lungs daily. 1 each 5  . tiZANidine (ZANAFLEX) 4 MG tablet Take 4 mg by mouth every 6 (six) hours as needed for muscle spasms.    Marland Kitchen torsemide  (DEMADEX) 20 MG tablet Take 20 mg by mouth daily.    Marland Kitchen triamcinolone (NASACORT) 55 MCG/ACT AERO nasal inhaler as needed.    . vitamin B-12 (CYANOCOBALAMIN) 500 MCG tablet Take 500 mcg by mouth daily.    Marland Kitchen HYDROcodone-acetaminophen (NORCO/VICODIN) 5-325 MG tablet as needed.    . carisoprodol (SOMA) 350 MG tablet Take by mouth as needed. (Patient not taking: Reported on 03/23/2020)    . atorvastatin (LIPITOR) 80 MG tablet Take 80 mg by mouth daily.    . cyclobenzaprine (FLEXERIL) 5 MG tablet Take by mouth as needed. (Patient not taking: Reported on 03/23/2020)    . diclofenac Sodium (VOLTAREN) 1 % GEL Apply topically daily.    . furosemide (LASIX) 40 MG tablet Take 40 mg by mouth daily.    . ondansetron (ZOFRAN) 4 MG tablet Take by mouth as needed.     No facility-administered medications prior to visit.    Radiology:   No results found.  Cardiac Studies:   Coronary angioplasty: -RCA PCI (Dr. Tonye Becket) 10/2011 for NSTEMI; LAD/Ramus DES 2008 Taxus;  Coronary angiogram 12/22/2017: A- Left Main: Mild plaquing, no critical lesions seen.  B- LAD: The LAD contains mild plaquingproximally and gives rise to a large high lying 1st diagonal branch in which a 40% ostial stenosis is present. Mid LAD contains a well deployed stent which is patent, no evidence for significant restenoses. The distal LAD is very large and free of significant disease.  C- Left Circumflex: Limited views of the left circumflex were obtained due to dye  issues. There appears to be a proximal left circumflex and 1st obtuse marginal stented segments which are patent and show no it evidence for significant restenoses.  D- RCA: The RCA is small and contains a patent proximal stent with 50 to 60% mid stenosis and mild plaquing noted distally.  Peripheral arteriogram 12/22/2017:  There are bilateral SFA stents present with evidence for severe right and mild left in stent restenoses present. The bilateral popliteal arteries and proximal  trifurcation vessels are patent. However, there may be moderate to severe lesion involving the mid right anterior tibial artery in particular. In addition, there was poor flow noted in the left anterior tibial artery with 2 vessel runoff probably present bilaterally. Laser atherectomy of right SFA followed by DCB with 5.0 x 120 mm impact.  Echocardiogram 07/04/2019: Normal LV size, mild LVH, hyperdynamic LVEF at >65% with grade 1 diastolic dysfunction.  Otherwise no other significant abnormality.  Poor quality due to patient body habitus.  Lexiscan nuclear stress test 07/07/2019: Nondiagnostic EKG.  Dyspnea during stress test. No evidence of ischemia.  Soft tissue attenuation noted.  LVEF 72%.  Normal wall motion.  Low risk study.  No significant change from 12/20/2017.  Lower extremity venous duplex 07/02/2019: No evidence of DVT in bilateral lower extremity.  Lower extremity arterial duplex 12/16/2019: 1. 20-49% stenosis of the right distal common femoral artery. 2. 20-49% stenosis of the right distal femoral artery in stent; rest of  distal femoral artery stent is patent without stenosis. 3. High grade stenosis in the right mid anterior tibial artery. 4. The right peroneal artery is occluded. 5. Right ABI is .93 and the left, .86. 6. Bilateral ankle, metatarsal, and toe pressure waveforms demonstrate mild attenuation. Right Lower Arterial The right distal superficial femoral artery has a stent present.   EKG:     EKG 03/23/2020: Normal sinus rhythm at rate of 74 bpm, normal axis, poor R wave progression, cannot exclude anteroseptal infarct old.  No evidence of ischemia.     Assessment     ICD-10-CM   1. Coronary artery disease involving native coronary artery of native heart without angina pectoris  I25.10 EKG 12-Lead  2. PAD (peripheral artery disease) (HCC)  I73.9   3. Type 2 diabetes mellitus with stage 3a chronic kidney disease, with long-term current use of insulin (HCC)   E11.22    N18.31    Z79.4   4. Primary hypertension  I10   5. Hypercholesteremia  E78.00   6. Acute on chronic combined systolic and diastolic CHF (congestive heart failure) (HCC)  I50.43 EKG 12-Lead    Medications Discontinued During This Encounter  Medication Reason  . HYDROcodone-acetaminophen (NORCO/VICODIN) 5-325 MG tablet Error  . atorvastatin (LIPITOR) 80 MG tablet Change in therapy  . ondansetron (ZOFRAN) 4 MG tablet   . cyclobenzaprine (FLEXERIL) 5 MG tablet Error  . diclofenac Sodium (VOLTAREN) 1 % GEL Error  . furosemide (LASIX) 40 MG tablet Error    No orders of the defined types were placed in this encounter.  Orders Placed This Encounter  Procedures  . EKG 12-Lead   Recommendations:   KEMIA WENDEL is a 65 y.o. Female patient with coronary artery disease and history of PCI to the LAD and ramus in 2008 with implantation of Taxus stents and NSTEMI SP RCA PCI in 8099, chronic diastolic heart failure, hyperlipidemia, hypertension, diabetes mellitus with stage III chronic kidney disease, morbid obesity, OSA unable to tolerate CPAP, chronic renal insufficiency, peripheral arterial disease with right  posterior tibial DES stent placed in 2008 and bilateral SFA stenting sometime in 2016 and atherectomy for ISR in 2019 is now referred to me to establish cardiac care.   Patient underwent right shoulder arthroplasty on 12/23/2019 in [place].  Fortunately she did not have any periprocedural complications.  She is presently doing well and there is no clinical evidence of acute decompensated heart failure.  Her blood pressure is elevated today but I did not make any changes to her medications as previously with elevated blood pressure and increasing lisinopril to 40 mg she had noticed hypotensive episodes.  Also she has stage II-III chronic kidney disease related to diabetes mellitus.  With weight loss I truly suspect her blood pressure will improve.  I would like to see her back in  2 months to know her better as she has multiple cardiovascular risks and underlying coronary and peripheral arterial disease.  She is presently on dual antiplatelet therapy with aspirin and Plavix, will discontinue aspirin and continue Plavix alone.   Lipids are well controlled, continue statin therapy.  Weight loss again discussed with the patient.  This was a 90-minute office visit with evaluation of previous medical records and updating them, evaluation of external labs and discussions regarding multiple complex medical issues.   Katrina Prows, MD, [facility] 03/23/2020, 11:28 AM Office: [phone]

## 2020-03-29 ENCOUNTER — Ambulatory Visit: Payer: Medicare HMO

## 2020-03-29 ENCOUNTER — Telehealth: Payer: Self-pay

## 2020-03-29 ENCOUNTER — Ambulatory Visit (INDEPENDENT_AMBULATORY_CARE_PROVIDER_SITE_OTHER): Payer: Medicare HMO

## 2020-03-29 DIAGNOSIS — J309 Allergic rhinitis, unspecified: Secondary | ICD-10-CM

## 2020-03-29 NOTE — Telephone Encounter (Signed)
I have tagged the crew. Not sure who will follow this lady moving forward.  Saw [facility] initial visit a month ago.   I looked at the note.  I do not see any information regarding her current allergen immunotherapy.   Thus will need to get the records from her previous allergists including the [facility].    Pending on what we receive she may just need to do our own testing.     If she is due for injections in 2 weeks and will be out of her vials then she either will need to get new vials made from her previous allergist to continue until we can make vials here or she just complete her current vials and we start over with our vials once made after testing.    Any other ideas?

## 2020-03-29 NOTE — Telephone Encounter (Signed)
I spoke with [facility] and she is going to reach out for me to get the recipe for her vials.

## 2020-03-29 NOTE — Telephone Encounter (Signed)
Patient is currently on allergy injections made from [place]. She would like our practice to take over making them. She is aware that we would have to start the build process again. She will be due again in 2 weeks, but has about one more dose in her current vial set.

## 2020-03-30 ENCOUNTER — Other Ambulatory Visit: Payer: Self-pay

## 2020-03-30 ENCOUNTER — Ambulatory Visit
Admission: RE | Admit: 2020-03-30 | Discharge: 2020-03-30 | Disposition: A | Discharge status: Home / Self Care | Payer: Medicare HMO | Source: Ambulatory Visit | Attending: Orthopaedic Surgery | Admitting: Orthopaedic Surgery

## 2020-03-30 DIAGNOSIS — Z96611 Presence of right artificial shoulder joint: Secondary | ICD-10-CM | POA: Diagnosis not present

## 2020-03-30 DIAGNOSIS — Z471 Aftercare following joint replacement surgery: Secondary | ICD-10-CM | POA: Diagnosis not present

## 2020-03-30 DIAGNOSIS — M25511 Pain in right shoulder: Secondary | ICD-10-CM

## 2020-04-04 NOTE — Telephone Encounter (Signed)
Lonn [place], I was going to follow up on the records from her previous allergist in [place].  Was it Dee at our practice or her previous allergist that you spoke with?  Thanks.

## 2020-04-04 NOTE — Telephone Encounter (Signed)
Yes it was Needles at [employer]. I was actually going to reach out to [place] today, but she was not available.

## 2020-04-05 NOTE — Telephone Encounter (Signed)
I left a message requesting a call back about need for medical record release. I also let her know that we would need her to come in to the office to sign the form for [place] to get the information we need from the old allergist.

## 2020-04-06 NOTE — Telephone Encounter (Signed)
Medical release was signed and faxed to Dr. Bonne Dolores office at [phone].

## 2020-04-10 DIAGNOSIS — M25511 Pain in right shoulder: Secondary | ICD-10-CM | POA: Diagnosis not present

## 2020-04-13 DIAGNOSIS — I5032 Chronic diastolic (congestive) heart failure: Secondary | ICD-10-CM | POA: Diagnosis not present

## 2020-04-13 DIAGNOSIS — K219 Gastro-esophageal reflux disease without esophagitis: Secondary | ICD-10-CM | POA: Diagnosis not present

## 2020-04-13 DIAGNOSIS — N289 Disorder of kidney and ureter, unspecified: Secondary | ICD-10-CM | POA: Diagnosis not present

## 2020-04-13 DIAGNOSIS — I10 Essential (primary) hypertension: Secondary | ICD-10-CM | POA: Diagnosis not present

## 2020-04-13 DIAGNOSIS — E782 Mixed hyperlipidemia: Secondary | ICD-10-CM | POA: Diagnosis not present

## 2020-04-13 DIAGNOSIS — J410 Simple chronic bronchitis: Secondary | ICD-10-CM | POA: Diagnosis not present

## 2020-04-13 DIAGNOSIS — E119 Type 2 diabetes mellitus without complications: Secondary | ICD-10-CM | POA: Diagnosis not present

## 2020-04-13 DIAGNOSIS — M19011 Primary osteoarthritis, right shoulder: Secondary | ICD-10-CM | POA: Diagnosis not present

## 2020-04-17 DIAGNOSIS — M19011 Primary osteoarthritis, right shoulder: Secondary | ICD-10-CM | POA: Diagnosis not present

## 2020-04-17 DIAGNOSIS — M25511 Pain in right shoulder: Secondary | ICD-10-CM | POA: Diagnosis not present

## 2020-04-17 DIAGNOSIS — M25611 Stiffness of right shoulder, not elsewhere classified: Secondary | ICD-10-CM | POA: Diagnosis not present

## 2020-04-17 DIAGNOSIS — M6281 Muscle weakness (generalized): Secondary | ICD-10-CM | POA: Diagnosis not present

## 2020-04-19 ENCOUNTER — Telehealth: Payer: Self-pay | Admitting: Allergy & Immunology

## 2020-04-19 ENCOUNTER — Ambulatory Visit (INDEPENDENT_AMBULATORY_CARE_PROVIDER_SITE_OTHER): Payer: Medicare HMO

## 2020-04-19 DIAGNOSIS — M6281 Muscle weakness (generalized): Secondary | ICD-10-CM | POA: Diagnosis not present

## 2020-04-19 DIAGNOSIS — M19011 Primary osteoarthritis, right shoulder: Secondary | ICD-10-CM | POA: Diagnosis not present

## 2020-04-19 DIAGNOSIS — J302 Other seasonal allergic rhinitis: Secondary | ICD-10-CM

## 2020-04-19 DIAGNOSIS — J309 Allergic rhinitis, unspecified: Secondary | ICD-10-CM | POA: Diagnosis not present

## 2020-04-19 DIAGNOSIS — M25511 Pain in right shoulder: Secondary | ICD-10-CM | POA: Diagnosis not present

## 2020-04-19 DIAGNOSIS — J3081 Allergic rhinitis due to animal (cat) (dog) hair and dander: Secondary | ICD-10-CM | POA: Diagnosis not present

## 2020-04-19 DIAGNOSIS — M25611 Stiffness of right shoulder, not elsewhere classified: Secondary | ICD-10-CM | POA: Diagnosis not present

## 2020-04-19 NOTE — Progress Notes (Signed)
VIALS EXP 04-19-21

## 2020-04-19 NOTE — Telephone Encounter (Signed)
We received outside records from her allergist regarding her immunotherapy script.  Unfortunately, the use [place] extracts as well.    Vial 1 contains 1.25 mL English plantain, 1.25 mL sheep sorrell, 1.25 mL of weed mix, 1.25 mL of cat, and 1.25 mL of dog.  We can mix this vial with decrease in allergen volumes (to allow some diluent AND because 1.25 mL x 5 actually is well over the 5 mL of our vials, unsure of the disconnect between the prescription). We will restart at the [facility] on schedule A and then switch to schedule see once she reaches 0.5 mL of the Red Vial.   Vial 2 contains 1.66 mL Roche, 1.66 mL dust mites, and 1.66 mL of 15 mold mix.  We do not have the 15 mold mix, however we did figure out which ones are included and not and we have Alternaria, Aspergillus mix, Aureobasidium, Bipolaris, Botrytis, Candida, Cladosporium,Epicoccum, Mucor, Phoma, and Rhizopus.  There are 3 animals that we do not have.  We will order a remix of vial #2, but start at Green Vial 0.05 mL and advance on schedule A to 0.5 mL of the Red Carlisle Cater, MD [facility] [place] [facility] [place]

## 2020-04-19 NOTE — Progress Notes (Signed)
Aeroallergen Immunotherapy    Patient Details  Name: Katrina Vega  MRN: [id number]  Date of Birth: November 30, 1955   Order 2 of 2   Vial Label: Molds/DM/CR   0.2 ml (Volume) 1:20 Concentration -- Alternaria alternata  0.2 ml (Volume) 1:20 Concentration -- Cladosporium herbarum  0.2 ml (Volume) 1:10 Concentration -- Aspergillus mix  0.2 ml (Volume) 1:20 Concentration -- Bipolaris sorokiniana  0.2 ml (Volume) 1:10 Concentration -- Mucor plumbeus  0.2 ml (Volume) 1:40 Concentration -- Aureobasidium pullulans  0.2 ml (Volume) 1:10 Concentration -- Rhizopus oryzae  0.2 ml (Volume) 1:40 Concentration -- Botrytis cinerea  0.2 ml (Volume) 1:40 Concentration -- Epicoccum nigrum  0.2 ml (Volume) 1:40 Concentration -- Phoma betae  0.5 ml (Volume) 1:10 Concentration -- Candida Albicans  0.3 ml (Volume) 1:20 Concentration -- Cockroach, German  0.7 ml (Volume)  AU Concentration -- Mite Mix (DF 5,000 & DP 5,000)    3.5 ml Extract Subtotal  1.5 ml Diluent  5.0 ml Maintenance Total    Final Concentration above is stated in weight/volume (wt/vol). Allergen units (AU/ml) biological units (BAU/ml). The total volume is 5 ml.    Schedule: A   Special Instructions: Start at [employer] 0.05 mL and increase on Schedule A since we had to use some different mold mixes. Once she reaches Red Vial 0.5 mL, we can change back to Schedule C.

## 2020-04-19 NOTE — Progress Notes (Signed)
Aeroallergen Immunotherapy    Patient Details  Name: Katrina Vega  MRN: [id number]  Date of Birth: 1955-09-29   Order 1 of 2   Vial Label: W/C/D   0.5 ml (Volume) 1:10 Concentration -- Plantain English  0.5 ml (Volume) 1:10 Concentration -- Sheep Sorrell*  0.5 ml (Volume) 1:20 Concentration -- Weed Mix*  0.7 ml (Volume) 1:10 Concentration -- Cat Hair  0.7 ml (Volume) 1:10 Concentration -- Dog Epithelia    2.9 ml Extract Subtotal  2.1 ml Diluent  5.0 ml Maintenance Total    Final Concentration above is stated in weight/volume (wt/vol). Allergen units (AU/ml) biological units (BAU/ml). The total volume is 5 ml.    Schedule: C   Special Instructions: Start at Red Vial 0.1 mL and advance on Schedule C.

## 2020-04-20 DIAGNOSIS — J3089 Other allergic rhinitis: Secondary | ICD-10-CM | POA: Diagnosis not present

## 2020-04-24 DIAGNOSIS — M25511 Pain in right shoulder: Secondary | ICD-10-CM | POA: Diagnosis not present

## 2020-04-24 DIAGNOSIS — M6281 Muscle weakness (generalized): Secondary | ICD-10-CM | POA: Diagnosis not present

## 2020-04-24 DIAGNOSIS — M19011 Primary osteoarthritis, right shoulder: Secondary | ICD-10-CM | POA: Diagnosis not present

## 2020-04-24 DIAGNOSIS — M25611 Stiffness of right shoulder, not elsewhere classified: Secondary | ICD-10-CM | POA: Diagnosis not present

## 2020-04-26 DIAGNOSIS — M25611 Stiffness of right shoulder, not elsewhere classified: Secondary | ICD-10-CM | POA: Diagnosis not present

## 2020-04-26 DIAGNOSIS — M25511 Pain in right shoulder: Secondary | ICD-10-CM | POA: Diagnosis not present

## 2020-04-26 DIAGNOSIS — M19011 Primary osteoarthritis, right shoulder: Secondary | ICD-10-CM | POA: Diagnosis not present

## 2020-04-26 DIAGNOSIS — M6281 Muscle weakness (generalized): Secondary | ICD-10-CM | POA: Diagnosis not present

## 2020-04-27 ENCOUNTER — Other Ambulatory Visit: Payer: Self-pay | Admitting: Orthopaedic Surgery

## 2020-04-27 DIAGNOSIS — M542 Cervicalgia: Secondary | ICD-10-CM | POA: Diagnosis not present

## 2020-04-27 DIAGNOSIS — M25511 Pain in right shoulder: Secondary | ICD-10-CM | POA: Diagnosis not present

## 2020-05-01 DIAGNOSIS — M19011 Primary osteoarthritis, right shoulder: Secondary | ICD-10-CM | POA: Diagnosis not present

## 2020-05-01 DIAGNOSIS — M25611 Stiffness of right shoulder, not elsewhere classified: Secondary | ICD-10-CM | POA: Diagnosis not present

## 2020-05-01 DIAGNOSIS — M6281 Muscle weakness (generalized): Secondary | ICD-10-CM | POA: Diagnosis not present

## 2020-05-01 DIAGNOSIS — M25511 Pain in right shoulder: Secondary | ICD-10-CM | POA: Diagnosis not present

## 2020-05-03 ENCOUNTER — Ambulatory Visit: Payer: Medicare HMO

## 2020-05-04 ENCOUNTER — Ambulatory Visit (INDEPENDENT_AMBULATORY_CARE_PROVIDER_SITE_OTHER): Payer: Medicare HMO | Admitting: *Deleted

## 2020-05-04 DIAGNOSIS — M25611 Stiffness of right shoulder, not elsewhere classified: Secondary | ICD-10-CM | POA: Diagnosis not present

## 2020-05-04 DIAGNOSIS — M6281 Muscle weakness (generalized): Secondary | ICD-10-CM | POA: Diagnosis not present

## 2020-05-04 DIAGNOSIS — M25511 Pain in right shoulder: Secondary | ICD-10-CM | POA: Diagnosis not present

## 2020-05-04 DIAGNOSIS — J309 Allergic rhinitis, unspecified: Secondary | ICD-10-CM

## 2020-05-04 DIAGNOSIS — M19011 Primary osteoarthritis, right shoulder: Secondary | ICD-10-CM | POA: Diagnosis not present

## 2020-05-07 ENCOUNTER — Ambulatory Visit: Payer: Medicare HMO | Admitting: Allergy

## 2020-05-08 DIAGNOSIS — E119 Type 2 diabetes mellitus without complications: Secondary | ICD-10-CM | POA: Diagnosis not present

## 2020-05-09 ENCOUNTER — Encounter: Payer: Self-pay | Admitting: Allergy

## 2020-05-09 ENCOUNTER — Ambulatory Visit: Payer: Medicare HMO | Admitting: Allergy

## 2020-05-09 ENCOUNTER — Other Ambulatory Visit: Payer: Self-pay

## 2020-05-09 VITALS — BP 126/76 | HR 79 | Temp 98.1°F | Resp 18 | Ht 65.0 in | Wt 256.6 lb

## 2020-05-09 DIAGNOSIS — J309 Allergic rhinitis, unspecified: Secondary | ICD-10-CM | POA: Diagnosis not present

## 2020-05-09 DIAGNOSIS — J3089 Other allergic rhinitis: Secondary | ICD-10-CM | POA: Diagnosis not present

## 2020-05-09 DIAGNOSIS — J449 Chronic obstructive pulmonary disease, unspecified: Secondary | ICD-10-CM | POA: Diagnosis not present

## 2020-05-09 DIAGNOSIS — H1010 Acute atopic conjunctivitis, unspecified eye: Secondary | ICD-10-CM

## 2020-05-09 DIAGNOSIS — H1013 Acute atopic conjunctivitis, bilateral: Secondary | ICD-10-CM | POA: Diagnosis not present

## 2020-05-09 DIAGNOSIS — J302 Other seasonal allergic rhinitis: Secondary | ICD-10-CM

## 2020-05-09 MED ORDER — SPIRIVA RESPIMAT 1.25 MCG/ACT IN AERS: 2.0000 | INHALATION_SPRAY | Freq: Every day | RESPIRATORY_TRACT | 2 refills | Status: DC

## 2020-05-09 MED ORDER — AZELASTINE HCL 0.1 % NA SOLN: NASAL | 2 refills | Status: DC

## 2020-05-09 MED ORDER — LEVOCETIRIZINE DIHYDROCHLORIDE 5 MG PO TABS: 5.0000 mg | ORAL_TABLET | Freq: Every evening | ORAL | 2 refills | Status: DC

## 2020-05-09 MED ORDER — BUDESONIDE-FORMOTEROL FUMARATE 160-4.5 MCG/ACT IN AERO: 2.0000 | INHALATION_SPRAY | Freq: Two times a day (BID) | RESPIRATORY_TRACT | 2 refills | Status: DC

## 2020-05-09 NOTE — Assessment & Plan Note (Signed)
Patient was started on allergy injections in [place] about 5-6 months ago with good benefit (ROACH, MITE, MOLD; PL,SORREL, WEED, CAT, DOG). No reactions. Symptoms stable.  Continue environmental control measures.  Continue allergy injections - given today.   May use over the counter antihistamines such as Zyrtec (cetirizine), Claritin (loratadine), Allegra (fexofenadine), or Xyzal (levocetirizine) daily as needed.  May use azelastine nasal spray 1-2 sprays per nostril twice a day as needed for runny nose/drainage.  May use Nasacort nasal spray 1 spray per nostril twice a day as needed for nasal congestion.   Nasal saline spray (i.e., Simply Saline) or nasal saline lavage (i.e., NeilMed) is recommended as needed and prior to medicated nasal sprays.  May use Zaditor eye drops 1 drop in each eye twice a day as needed for itchy/watery eyes.

## 2020-05-09 NOTE — Assessment & Plan Note (Signed)
Patient was using the spacer with the Spiriva and not Symbicort. Using albuterol about twice per week with good benefit.  Today's spirometry showed some restriction.  . Daily controller medication(s): continue Symbicort 123mcg 2 puffs twice a day with spacer and rinse mouth afterwards. . Continue Spiriva 1.30mcg 2 puffs once a day.  . May use albuterol rescue inhaler 2 puffs every 4 to 6 hours as needed for shortness of breath, chest tightness, coughing, and wheezing. May use albuterol rescue inhaler 2 puffs 5 to 15 minutes prior to strenuous physical activities. Monitor frequency of use.  . Repeat spirometry at next visit.

## 2020-05-09 NOTE — Patient Instructions (Addendum)
Allergic rhino conjunctivitis  Continue environmental control measures.  Continue allergy injections - given today.   May use over the counter antihistamines such as Zyrtec (cetirizine), Claritin (loratadine), Allegra (fexofenadine), or Xyzal (levocetirizine) daily as needed.  May use azelastine nasal spray 1-2 sprays per nostril twice a day as needed for runny nose/drainage.  May use Nasacort nasal spray 1 spray per nostril twice a day as needed for nasal congestion.   Nasal saline spray (i.e., Simply Saline) or nasal saline lavage (i.e., NeilMed) is recommended as needed and prior to medicated nasal sprays.  May use Zaditor eye drops 1 drop in each eye twice a day as needed for itchy/watery eyes.  Asthma/COPD . Daily controller medication(s): continue Symbicort 146mcg 2 puffs twice a day with spacer and rinse mouth afterwards. . Continue Spiriva 1.93mcg 2 puffs once a day.  . May use albuterol rescue inhaler 2 puffs every 4 to 6 hours as needed for shortness of breath, chest tightness, coughing, and wheezing. May use albuterol rescue inhaler 2 puffs 5 to 15 minutes prior to strenuous physical activities. Monitor frequency of use.  . Asthma control goals:  o Full participation in all desired activities (may need albuterol before activity) o Albuterol use two times or less a week on average (not counting use with activity) o Cough interfering with sleep two times or less a month o Oral steroids no more than once a year o No hospitalizations  Follow up in 3 months or sooner if needed.  Sent 3 months supply to your mail order pharmacy - xyzal, azelastine nasal spray, symbicort and spiriva.   Cockroach Allergen Avoidance Cockroaches are often found in the homes of densely populated urban areas, schools or commercial buildings, but these creatures can lurk almost anywhere. This does not mean that you have a dirty house or living area. . Block all areas where roaches can enter the home.  This includes crevices, wall cracks and windows.  . Cockroaches need water to survive, so fix and seal all leaky faucets and pipes. Have an exterminator go through the house when your family and pets are gone to eliminate any remaining roaches. Marland Kitchen Keep food in lidded containers and put pet food dishes away after your pets are done eating. Vacuum and sweep the floor after meals, and take out garbage and recyclables. Use lidded garbage containers in the kitchen. Wash dishes immediately after use and clean under stoves, refrigerators or toasters where crumbs can accumulate. Wipe off the stove and other kitchen surfaces and cupboards regularly. Control of House Dust Mite Allergen . Dust mite allergens are a common trigger of allergy and asthma symptoms. While they can be found throughout the house, these microscopic creatures thrive in warm, humid environments such as bedding, upholstered furniture and carpeting. . Because so much time is spent in the bedroom, it is essential to reduce mite levels there.  . Encase pillows, mattresses, and box springs in special allergen-proof fabric covers or airtight, zippered plastic covers.  . Bedding should be washed weekly in hot water (130 F) and dried in a hot dryer. Allergen-proof covers are available for comforters and pillows that can't be regularly washed.  Wendee Copp the allergy-proof covers every few months. Minimize clutter in the bedroom. Keep pets out of the bedroom.  Marland Kitchen Keep humidity less than 50% by using a dehumidifier or air conditioning. You can buy a humidity measuring device called a hygrometer to monitor this.  . If possible, replace carpets with hardwood, linoleum, or  washable area rugs. If that's not possible, vacuum frequently with a vacuum that has a HEPA filter. . Remove all upholstered furniture and non-washable window drapes from the bedroom. . Remove all non-washable stuffed toys from the bedroom.  Wash stuffed toys weekly. Mold Control . Mold  and fungi can grow on a variety of surfaces provided certain temperature and moisture conditions exist.  . Outdoor molds grow on plants, decaying vegetation and soil. The major outdoor mold, Alternaria and Cladosporium, are found in very high numbers during hot and dry conditions. Generally, a late summer - fall peak is seen for common outdoor fungal spores. Rain will temporarily lower outdoor mold spore count, but counts rise rapidly when the rainy period ends. . The most important indoor molds are Aspergillus and Penicillium. Dark, humid and poorly ventilated basements are ideal sites for mold growth. The next most common sites of mold growth are the bathroom and the kitchen. Outdoor (Seasonal) Mold Control . Use air conditioning and keep windows closed. . Avoid exposure to decaying vegetation. Marland Kitchen Avoid leaf raking. . Avoid grain handling. . Consider wearing a face mask if working in moldy areas.  Indoor (Perennial) Mold Control  . Maintain humidity below 50%. . Get rid of mold growth on hard surfaces with water, detergent and, if necessary, 5% bleach (do not mix with other cleaners). Then dry the area completely. If mold covers an area more than 10 square feet, consider hiring an indoor environmental professional. . For clothing, washing with soap and water is best. If moldy items cannot be cleaned and dried, throw them away. . Remove sources e.g. contaminated carpets. . Repair and seal leaking roofs or pipes. Using dehumidifiers in damp basements may be helpful, but empty the water and clean units regularly to prevent mildew from forming. All rooms, especially basements, bathrooms and kitchens, require ventilation and cleaning to deter mold and mildew growth. Avoid carpeting on concrete or damp floors, and storing items in damp areas. Pet Allergen Avoidance: . Contrary to popular opinion, there are no "hypoallergenic" breeds of dogs or cats. That is because people are not allergic to an animal's  hair, but to an allergen found in the animal's saliva, dander (dead skin flakes) or urine. Pet allergy symptoms typically occur within minutes. For some people, symptoms can build up and become most severe 8 to 12 hours after contact with the animal. People with severe allergies can experience reactions in public places if dander has been transported on the pet owners' clothing. Marland Kitchen Keeping an animal outdoors is only a partial solution, since homes with pets in the yard still have higher concentrations of animal allergens. . Before getting a pet, ask your allergist to determine if you are allergic to animals. If your pet is already considered part of your family, try to minimize contact and keep the pet out of the bedroom and other rooms where you spend a great deal of time. . As with dust mites, vacuum carpets often or replace carpet with a hardwood floor, tile or linoleum. . High-efficiency particulate air (HEPA) cleaners can reduce allergen levels over time. . While dander and saliva are the source of cat and dog allergens, urine is the source of allergens from rabbits, hamsters, mice and [place] pigs; so ask a non-allergic family member to clean the animal's cage. . If you have a pet allergy, talk to your allergist about the potential for allergy immunotherapy (allergy shots). This strategy can often provide long-term relief. Reducing Pollen Exposure . Pollen  seasons: trees (spring), grass (summer) and ragweed/weeds (fall). Marland Kitchen Keep windows closed in your home and car to lower pollen exposure.  Susa Simmonds air conditioning in the bedroom and throughout the house if possible.  . Avoid going out in dry windy days - especially early morning. . Pollen counts are highest between 5 - 10 AM and on dry, hot and windy days.  . Save outside activities for late afternoon or after a heavy rain, when pollen levels are lower.  . Avoid mowing of grass if you have grass pollen allergy. Marland Kitchen Be aware that pollen can also be  transported indoors on people and pets.  . Dry your clothes in an automatic dryer rather than hanging them outside where they might collect pollen.  . Rinse hair and eyes before bedtime.

## 2020-05-09 NOTE — Progress Notes (Signed)
Follow Up Note  RE: Katrina Vega MRN: [id number] DOB: [birth year] Date of Office Visit: 05/09/2020  Referring provider: No ref. provider found Primary care provider: Trey Sailors, [place]  Chief Complaint: Allergic Rhinitis   History of Present Illness: I had the pleasure of seeing Katrina Vega for a follow up visit at the [facility] on 05/09/2020. She is a 65 y.o. female, who is being followed for allergic rhinoconjunctivitis on AIT, asthma/COPD overlap syndrome. Her previous allergy office visit was on 02/29/2020 with Dr. Verlin Fester. Today is a regular follow up visit.  Allergic rhino conjunctivitis Patient has been on allergy injections for about 5-6 months with no issues starting in [place].  She did not start our new mix yet.  Currently taking Xyzal 5mg  daily, azelastine 1 spray per nostril at night, Nasacort 1 spray daily. No nosebleeds. Takes Zaditor as needed with good benefit.   Asthma-COPD overlap syndrome (HCC) Currently on Symbicort 131mcg 2 puffs twice a day, Spiriva 2 puffs daily and using albuterol about twice per week with good benefit. Denies any ER/urgent care visits or prednisone use since the last visit.  Assessment and Plan: [name] is a 65 y.o. female with: Asthma-COPD overlap syndrome ([place]) Patient was using the spacer with the Spiriva and not Symbicort. Using albuterol about twice per week with good benefit.  Today's spirometry showed some restriction.  . Daily controller medication(s): continue Symbicort 198mcg 2 puffs twice a day with spacer and rinse mouth afterwards. . Continue Spiriva 1.28mcg 2 puffs once a day.  . May use albuterol rescue inhaler 2 puffs every 4 to 6 hours as needed for shortness of breath, chest tightness, coughing, and wheezing. May use albuterol rescue inhaler 2 puffs 5 to 15 minutes prior to strenuous physical activities. Monitor frequency of use.  . Repeat spirometry at next visit.  Seasonal and perennial allergic  rhinoconjunctivitis Patient was started on allergy injections in [place] about 5-6 months ago with good benefit (ROACH, MITE, MOLD; PL,SORREL, WEED, CAT, DOG). No reactions. Symptoms stable.  Continue environmental control measures.  Continue allergy injections - given today.   May use over the counter antihistamines such as Zyrtec (cetirizine), Claritin (loratadine), Allegra (fexofenadine), or Xyzal (levocetirizine) daily as needed.  May use azelastine nasal spray 1-2 sprays per nostril twice a day as needed for runny nose/drainage.  May use Nasacort nasal spray 1 spray per nostril twice a day as needed for nasal congestion.   Nasal saline spray (i.e., Simply Saline) or nasal saline lavage (i.e., NeilMed) is recommended as needed and prior to medicated nasal sprays.  May use Zaditor eye drops 1 drop in each eye twice a day as needed for itchy/watery eyes.  Return in about 3 months (around 08/09/2020).  Meds ordered this encounter  Medications  . levocetirizine (XYZAL) 5 MG tablet    Sig: Take 1 tablet (5 mg total) by mouth every evening.    Dispense:  90 tablet    Refill:  2  . azelastine (ASTELIN) 0.1 % nasal spray    Sig: 1-2 sprays per nostril 2 times daily as needed for runny nose.    Dispense:  90 mL    Refill:  2  . budesonide-formoterol (SYMBICORT) 160-4.5 MCG/ACT inhaler    Sig: Inhale 2 puffs into the lungs in the morning and at bedtime. with spacer and rinse mouth afterwards.    Dispense:  3 each    Refill:  2  . Tiotropium Bromide Monohydrate (SPIRIVA RESPIMAT)  1.25 MCG/ACT AERS    Sig: Inhale 2 puffs into the lungs daily.    Dispense:  3 each    Refill:  2   Lab Orders  No laboratory test(s) ordered today    Diagnostics: Spirometry:  Tracings reviewed. Her effort: It was hard to get consistent efforts and there is a question as to whether this reflects a maximal maneuver. FVC: 2.00L FEV1: 1.74L, 81% predicted FEV1/FVC ratio: 87% Interpretation: Spirometry  consistent with possible restrictive disease.  Please see scanned spirometry results for details.  Medication List:  Current Outpatient Medications  Medication Sig Dispense Refill  . albuterol (VENTOLIN HFA) 108 (90 Base) MCG/ACT inhaler Inhale 2 puffs into the lungs every 6 (six) hours as needed.    Marland Kitchen aspirin 81 MG EC tablet Take 81 mg by mouth daily.    . carisoprodol (SOMA) 350 MG tablet Take by mouth as needed.    . clobetasol (TEMOVATE) 0.05 % external solution Apply topically as needed.    . clopidogrel (PLAVIX) 75 MG tablet Take 75 mg by mouth.    . Continuous Blood Gluc Sensor (DEXCOM G6 SENSOR) MISC USE 1 SENSOR AS NEEDED CHANGE EVERY 10 DAYS    . EPINEPHrine (EPIPEN 2-PAK) 0.3 mg/0.3 mL IJ SOAJ injection Use as directed for severe allergic reactions 2 each 3  . ferrous sulfate 325 (65 FE) MG tablet Take 325 mg by mouth in the morning, at noon, and at bedtime.    . gabapentin (NEURONTIN) 300 MG capsule Take 300 mg by mouth in the morning, at noon, and at bedtime.    Marland Kitchen glucagon 1 MG injection Inject into the muscle as needed.    Marland Kitchen glucose 4 GM chewable tablet Chew by mouth as needed.    . insulin regular human CONCENTRATED (HUMULIN R) 500 UNIT/ML kwikpen Inject 80 Units into the skin daily.    . isosorbide mononitrate (IMDUR) 60 MG 24 hr tablet Take 60 mg by mouth daily.    Marland Kitchen ketotifen (ZADITOR) 0.025 % ophthalmic solution 1 drop 2 (two) times daily.    Marland Kitchen levocetirizine (XYZAL) 5 MG tablet Take 1 tablet (5 mg total) by mouth every evening. 90 tablet 2  . lisinopril (ZESTRIL) 20 MG tablet Take 20 mg by mouth daily.    . magnesium oxide (MAG-OX) 400 MG tablet Take 400 mg by mouth daily.    . meloxicam (MOBIC) 15 MG tablet Take 15 mg by mouth daily.    . metFORMIN (GLUCOPHAGE) 500 MG tablet Take 500 mg by mouth in the morning and at bedtime.    . metoprolol tartrate (LOPRESSOR) 50 MG tablet Take 50 mg by mouth every 12 (twelve) hours.    . nitroGLYCERIN (NITROSTAT) 0.4 MG SL tablet  Place under the tongue as needed.    . pantoprazole (PROTONIX) 40 MG tablet Take 40 mg by mouth in the morning and at bedtime.    . polyethylene glycol powder (GLYCOLAX/MIRALAX) 17 GM/SCOOP powder Take by mouth as needed.    Marland Kitchen Respiratory Therapy Supplies (CARETOUCH 2 CPAP HOSE HANGER) MISC by Does not apply route.    . rosuvastatin (CRESTOR) 40 MG tablet Take 40 mg by mouth at bedtime.    . Semaglutide, 1 MG/DOSE, (OZEMPIC, 1 MG/DOSE,) 4 MG/3ML SOPN Inject into the skin once a week.    . senna-docusate (SENOKOT-S) 8.6-50 MG tablet Take 2 tablets by mouth as needed.    Marland Kitchen tiZANidine (ZANAFLEX) 4 MG tablet Take 4 mg by mouth every 6 (six) hours as  needed for muscle spasms.    Marland Kitchen torsemide (DEMADEX) 20 MG tablet Take 20 mg by mouth daily.    Marland Kitchen triamcinolone (NASACORT) 55 MCG/ACT AERO nasal inhaler as needed.    . vitamin B-12 (CYANOCOBALAMIN) 500 MCG tablet Take 500 mcg by mouth daily.    Marland Kitchen azelastine (ASTELIN) 0.1 % nasal spray 1-2 sprays per nostril 2 times daily as needed for runny nose. 90 mL 2  . budesonide-formoterol (SYMBICORT) 160-4.5 MCG/ACT inhaler Inhale 2 puffs into the lungs in the morning and at bedtime. with spacer and rinse mouth afterwards. 3 each 2  . Tiotropium Bromide Monohydrate (SPIRIVA RESPIMAT) 1.25 MCG/ACT AERS Inhale 2 puffs into the lungs daily. 3 each 2   No current facility-administered medications for this visit.   Allergies: Allergies  Allergen Reactions  . Contrast Media [Iodinated Diagnostic Agents] Shortness Of Breath and Other (See Comments)    sob, chest tight  . Morphine Itching  . Statins Diarrhea and Other (See Comments)    nausea, diarrhea, myalgias   I reviewed her past medical history, social history, family history, and environmental history and no significant changes have been reported from her previous visit.  Review of Systems  Constitutional: Negative for appetite change, chills, fever and unexpected weight change.  HENT: Negative for  congestion and rhinorrhea.   Eyes: Negative for itching.  Respiratory: Negative for cough, chest tightness, shortness of breath and wheezing.   Gastrointestinal: Negative for abdominal pain.  Skin: Negative for rash.  Allergic/Immunologic: Positive for environmental allergies.  Neurological: Negative for headaches.   Objective: BP 126/76   Pulse 79   Temp 98.1 F (36.7 C)   Resp 18   Ht 5\' 5"  (1.651 m)   Wt 256 lb 9.6 oz (116.4 kg)   SpO2 96%   BMI 42.70 kg/m  Body mass index is 42.7 kg/m. Physical Exam Vitals and nursing note reviewed.  Constitutional:      Appearance: Normal appearance. She is well-developed.  HENT:     Head: Normocephalic and atraumatic.     Right Ear: External ear normal.     Left Ear: External ear normal.     Nose: Nose normal.     Mouth/Throat:     Mouth: Mucous membranes are moist.     Pharynx: Oropharynx is clear.  Eyes:     Conjunctiva/sclera: Conjunctivae normal.  Cardiovascular:     Rate and Rhythm: Normal rate and regular rhythm.     Heart sounds: Normal heart sounds. No murmur heard.   Pulmonary:     Effort: Pulmonary effort is normal.     Breath sounds: Normal breath sounds. No wheezing, rhonchi or rales.  Musculoskeletal:     Cervical back: Neck supple.  Skin:    General: Skin is warm.     Findings: No rash.  Neurological:     Mental Status: She is alert and oriented to person, place, and time.  Psychiatric:        Behavior: Behavior normal.    Previous notes and tests were reviewed. The plan was reviewed with the patient/family, and all questions/concerned were addressed.  It was my pleasure to see Marveline today and participate in her care. Please feel free to contact me with any questions or concerns.  Sincerely,  Rexene Alberts, DO Allergy & Immunology  [facility] office: [place] office: [phone]

## 2020-05-10 ENCOUNTER — Ambulatory Visit
Admission: RE | Admit: 2020-05-10 | Discharge: 2020-05-10 | Disposition: A | Discharge status: Home / Self Care | Payer: Medicare HMO | Source: Ambulatory Visit | Attending: Orthopaedic Surgery | Admitting: Orthopaedic Surgery

## 2020-05-10 DIAGNOSIS — M542 Cervicalgia: Secondary | ICD-10-CM

## 2020-05-10 DIAGNOSIS — M4802 Spinal stenosis, cervical region: Secondary | ICD-10-CM | POA: Diagnosis not present

## 2020-05-11 DIAGNOSIS — G4733 Obstructive sleep apnea (adult) (pediatric): Secondary | ICD-10-CM | POA: Diagnosis not present

## 2020-05-17 ENCOUNTER — Ambulatory Visit (INDEPENDENT_AMBULATORY_CARE_PROVIDER_SITE_OTHER): Payer: Medicare HMO | Admitting: *Deleted

## 2020-05-17 DIAGNOSIS — J309 Allergic rhinitis, unspecified: Secondary | ICD-10-CM | POA: Diagnosis not present

## 2020-05-18 ENCOUNTER — Telehealth: Payer: Self-pay | Admitting: *Deleted

## 2020-05-18 MED ORDER — OLOPATADINE HCL 0.2 % OP SOLN: 1.0000 [drp] | Freq: Every day | OPHTHALMIC | 5 refills | Status: DC | PRN

## 2020-05-18 NOTE — Telephone Encounter (Signed)
May use olopatadine eye drops 0.2% once a day as needed for itchy/watery eyes.  Sent in Rx.

## 2020-05-18 NOTE — Telephone Encounter (Signed)
Pt is currently using Zaditor OTC and it is not helping with her watery/burning eyes. She would like advice on an alternative. Please advise.

## 2020-05-19 ENCOUNTER — Other Ambulatory Visit: Payer: Medicare HMO

## 2020-05-21 ENCOUNTER — Ambulatory Visit: Payer: Medicare HMO | Admitting: Cardiology

## 2020-05-22 DIAGNOSIS — M19011 Primary osteoarthritis, right shoulder: Secondary | ICD-10-CM | POA: Diagnosis not present

## 2020-05-23 DIAGNOSIS — H25012 Cortical age-related cataract, left eye: Secondary | ICD-10-CM | POA: Diagnosis not present

## 2020-05-23 DIAGNOSIS — H524 Presbyopia: Secondary | ICD-10-CM | POA: Diagnosis not present

## 2020-05-23 DIAGNOSIS — H2512 Age-related nuclear cataract, left eye: Secondary | ICD-10-CM | POA: Diagnosis not present

## 2020-05-23 DIAGNOSIS — E113291 Type 2 diabetes mellitus with mild nonproliferative diabetic retinopathy without macular edema, right eye: Secondary | ICD-10-CM | POA: Diagnosis not present

## 2020-05-24 ENCOUNTER — Ambulatory Visit (INDEPENDENT_AMBULATORY_CARE_PROVIDER_SITE_OTHER): Payer: Medicare HMO | Admitting: *Deleted

## 2020-05-24 DIAGNOSIS — J309 Allergic rhinitis, unspecified: Secondary | ICD-10-CM | POA: Diagnosis not present

## 2020-05-28 DIAGNOSIS — M5090 Cervical disc disorder, unspecified, unspecified cervical region: Secondary | ICD-10-CM | POA: Diagnosis not present

## 2020-05-28 DIAGNOSIS — I10 Essential (primary) hypertension: Secondary | ICD-10-CM | POA: Diagnosis not present

## 2020-05-28 DIAGNOSIS — M542 Cervicalgia: Secondary | ICD-10-CM | POA: Diagnosis not present

## 2020-05-31 ENCOUNTER — Ambulatory Visit (INDEPENDENT_AMBULATORY_CARE_PROVIDER_SITE_OTHER): Payer: Medicare Other | Admitting: *Deleted

## 2020-05-31 DIAGNOSIS — J309 Allergic rhinitis, unspecified: Secondary | ICD-10-CM | POA: Diagnosis not present

## 2020-06-01 DIAGNOSIS — I5032 Chronic diastolic (congestive) heart failure: Secondary | ICD-10-CM | POA: Diagnosis not present

## 2020-06-01 DIAGNOSIS — M19011 Primary osteoarthritis, right shoulder: Secondary | ICD-10-CM | POA: Diagnosis not present

## 2020-06-01 DIAGNOSIS — N289 Disorder of kidney and ureter, unspecified: Secondary | ICD-10-CM | POA: Diagnosis not present

## 2020-06-01 DIAGNOSIS — E782 Mixed hyperlipidemia: Secondary | ICD-10-CM | POA: Diagnosis not present

## 2020-06-01 DIAGNOSIS — I10 Essential (primary) hypertension: Secondary | ICD-10-CM | POA: Diagnosis not present

## 2020-06-01 DIAGNOSIS — J410 Simple chronic bronchitis: Secondary | ICD-10-CM | POA: Diagnosis not present

## 2020-06-01 DIAGNOSIS — K219 Gastro-esophageal reflux disease without esophagitis: Secondary | ICD-10-CM | POA: Diagnosis not present

## 2020-06-01 DIAGNOSIS — E119 Type 2 diabetes mellitus without complications: Secondary | ICD-10-CM | POA: Diagnosis not present

## 2020-06-01 DIAGNOSIS — Z0001 Encounter for general adult medical examination with abnormal findings: Secondary | ICD-10-CM | POA: Diagnosis not present

## 2020-06-07 DIAGNOSIS — E119 Type 2 diabetes mellitus without complications: Secondary | ICD-10-CM | POA: Diagnosis not present

## 2020-06-12 DIAGNOSIS — M5412 Radiculopathy, cervical region: Secondary | ICD-10-CM | POA: Diagnosis not present

## 2020-06-12 DIAGNOSIS — M5022 Other cervical disc displacement, mid-cervical region, unspecified level: Secondary | ICD-10-CM | POA: Diagnosis not present

## 2020-06-14 ENCOUNTER — Ambulatory Visit (INDEPENDENT_AMBULATORY_CARE_PROVIDER_SITE_OTHER): Payer: Medicare Other | Admitting: *Deleted

## 2020-06-14 DIAGNOSIS — J309 Allergic rhinitis, unspecified: Secondary | ICD-10-CM | POA: Diagnosis not present

## 2020-06-19 ENCOUNTER — Encounter: Payer: Self-pay | Admitting: Cardiology

## 2020-06-22 ENCOUNTER — Ambulatory Visit: Payer: Medicare HMO

## 2020-06-22 ENCOUNTER — Ambulatory Visit (INDEPENDENT_AMBULATORY_CARE_PROVIDER_SITE_OTHER): Payer: Medicare Other

## 2020-06-22 DIAGNOSIS — J309 Allergic rhinitis, unspecified: Secondary | ICD-10-CM | POA: Diagnosis not present

## 2020-06-28 ENCOUNTER — Ambulatory Visit (INDEPENDENT_AMBULATORY_CARE_PROVIDER_SITE_OTHER): Payer: Medicare Other | Admitting: *Deleted

## 2020-06-28 DIAGNOSIS — J309 Allergic rhinitis, unspecified: Secondary | ICD-10-CM | POA: Diagnosis not present

## 2020-07-03 DIAGNOSIS — K219 Gastro-esophageal reflux disease without esophagitis: Secondary | ICD-10-CM | POA: Diagnosis not present

## 2020-07-03 DIAGNOSIS — M25551 Pain in right hip: Secondary | ICD-10-CM | POA: Diagnosis not present

## 2020-07-03 DIAGNOSIS — Z136 Encounter for screening for cardiovascular disorders: Secondary | ICD-10-CM | POA: Diagnosis not present

## 2020-07-03 DIAGNOSIS — I10 Essential (primary) hypertension: Secondary | ICD-10-CM | POA: Diagnosis not present

## 2020-07-03 DIAGNOSIS — Z794 Long term (current) use of insulin: Secondary | ICD-10-CM | POA: Diagnosis not present

## 2020-07-03 DIAGNOSIS — J410 Simple chronic bronchitis: Secondary | ICD-10-CM | POA: Diagnosis not present

## 2020-07-03 DIAGNOSIS — Z131 Encounter for screening for diabetes mellitus: Secondary | ICD-10-CM | POA: Diagnosis not present

## 2020-07-03 DIAGNOSIS — I5032 Chronic diastolic (congestive) heart failure: Secondary | ICD-10-CM | POA: Diagnosis not present

## 2020-07-03 DIAGNOSIS — Z0001 Encounter for general adult medical examination with abnormal findings: Secondary | ICD-10-CM | POA: Diagnosis not present

## 2020-07-03 DIAGNOSIS — E1165 Type 2 diabetes mellitus with hyperglycemia: Secondary | ICD-10-CM | POA: Diagnosis not present

## 2020-07-03 DIAGNOSIS — Z1329 Encounter for screening for other suspected endocrine disorder: Secondary | ICD-10-CM | POA: Diagnosis not present

## 2020-07-03 DIAGNOSIS — E782 Mixed hyperlipidemia: Secondary | ICD-10-CM | POA: Diagnosis not present

## 2020-07-04 DIAGNOSIS — M5412 Radiculopathy, cervical region: Secondary | ICD-10-CM | POA: Diagnosis not present

## 2020-07-04 DIAGNOSIS — M5022 Other cervical disc displacement, mid-cervical region, unspecified level: Secondary | ICD-10-CM | POA: Diagnosis not present

## 2020-07-05 ENCOUNTER — Ambulatory Visit: Payer: Medicare HMO | Admitting: Cardiology

## 2020-07-12 ENCOUNTER — Ambulatory Visit (INDEPENDENT_AMBULATORY_CARE_PROVIDER_SITE_OTHER): Payer: Medicare Other

## 2020-07-12 DIAGNOSIS — J309 Allergic rhinitis, unspecified: Secondary | ICD-10-CM | POA: Diagnosis not present

## 2020-07-13 DIAGNOSIS — E1165 Type 2 diabetes mellitus with hyperglycemia: Secondary | ICD-10-CM | POA: Diagnosis not present

## 2020-07-13 DIAGNOSIS — I10 Essential (primary) hypertension: Secondary | ICD-10-CM | POA: Diagnosis not present

## 2020-07-13 DIAGNOSIS — E782 Mixed hyperlipidemia: Secondary | ICD-10-CM | POA: Diagnosis not present

## 2020-07-13 DIAGNOSIS — M722 Plantar fascial fibromatosis: Secondary | ICD-10-CM | POA: Diagnosis not present

## 2020-07-13 DIAGNOSIS — Z794 Long term (current) use of insulin: Secondary | ICD-10-CM | POA: Diagnosis not present

## 2020-07-13 DIAGNOSIS — I5032 Chronic diastolic (congestive) heart failure: Secondary | ICD-10-CM | POA: Diagnosis not present

## 2020-07-13 DIAGNOSIS — K219 Gastro-esophageal reflux disease without esophagitis: Secondary | ICD-10-CM | POA: Diagnosis not present

## 2020-07-13 DIAGNOSIS — J410 Simple chronic bronchitis: Secondary | ICD-10-CM | POA: Diagnosis not present

## 2020-07-18 DIAGNOSIS — Z794 Long term (current) use of insulin: Secondary | ICD-10-CM | POA: Diagnosis not present

## 2020-07-18 DIAGNOSIS — E1122 Type 2 diabetes mellitus with diabetic chronic kidney disease: Secondary | ICD-10-CM | POA: Diagnosis not present

## 2020-07-18 DIAGNOSIS — N182 Chronic kidney disease, stage 2 (mild): Secondary | ICD-10-CM | POA: Diagnosis not present

## 2020-07-18 DIAGNOSIS — E1159 Type 2 diabetes mellitus with other circulatory complications: Secondary | ICD-10-CM | POA: Diagnosis not present

## 2020-07-18 DIAGNOSIS — E1140 Type 2 diabetes mellitus with diabetic neuropathy, unspecified: Secondary | ICD-10-CM | POA: Diagnosis not present

## 2020-07-19 ENCOUNTER — Ambulatory Visit (INDEPENDENT_AMBULATORY_CARE_PROVIDER_SITE_OTHER): Payer: Medicare Other | Admitting: *Deleted

## 2020-07-19 DIAGNOSIS — J309 Allergic rhinitis, unspecified: Secondary | ICD-10-CM

## 2020-07-19 DIAGNOSIS — M545 Low back pain, unspecified: Secondary | ICD-10-CM | POA: Diagnosis not present

## 2020-07-19 DIAGNOSIS — M25551 Pain in right hip: Secondary | ICD-10-CM | POA: Diagnosis not present

## 2020-07-26 ENCOUNTER — Ambulatory Visit (INDEPENDENT_AMBULATORY_CARE_PROVIDER_SITE_OTHER): Payer: Medicare Other | Admitting: *Deleted

## 2020-07-26 DIAGNOSIS — J309 Allergic rhinitis, unspecified: Secondary | ICD-10-CM | POA: Diagnosis not present

## 2020-07-30 ENCOUNTER — Ambulatory Visit (INDEPENDENT_AMBULATORY_CARE_PROVIDER_SITE_OTHER): Payer: Medicare Other | Admitting: Podiatry

## 2020-07-30 ENCOUNTER — Ambulatory Visit (INDEPENDENT_AMBULATORY_CARE_PROVIDER_SITE_OTHER): Payer: Medicare Other

## 2020-07-30 ENCOUNTER — Encounter: Payer: Self-pay | Admitting: Podiatry

## 2020-07-30 ENCOUNTER — Other Ambulatory Visit: Payer: Self-pay

## 2020-07-30 DIAGNOSIS — M722 Plantar fascial fibromatosis: Secondary | ICD-10-CM

## 2020-07-30 DIAGNOSIS — L84 Corns and callosities: Secondary | ICD-10-CM

## 2020-07-30 DIAGNOSIS — M79671 Pain in right foot: Secondary | ICD-10-CM

## 2020-07-30 DIAGNOSIS — D689 Coagulation defect, unspecified: Secondary | ICD-10-CM

## 2020-07-30 MED ORDER — TRIAMCINOLONE ACETONIDE 10 MG/ML IJ SUSP
10.0000 mg | Freq: Once | INTRAMUSCULAR | Status: AC
  Administered 2020-07-30: 10 mg

## 2020-07-30 NOTE — Progress Notes (Signed)
Subjective:   Patient ID: Katrina Vega, female   DOB: 65 y.o.   MRN: [id number]   HPI Patient states she developed a lot of pain in the bottom of her right heel she has nails that are sometimes hard for her to cut and lesion right that is been very painful.  She is tried prednisone which helped temporarily soaks elevation and patient does not smoke likes to be active moderately obese   Review of Systems  All other systems reviewed and are negative.       Objective:  Physical Exam Vitals and nursing note reviewed.  Constitutional:      Appearance: She is well-developed.  Pulmonary:     Effort: Pulmonary effort is normal.  Musculoskeletal:        General: Normal range of motion.  Skin:    General: Skin is warm.  Neurological:     Mental Status: She is alert.     Neurovascular status intact muscle strength adequate range of motion appear to be within normal limits with exquisite discomfort plantar aspect right heel insertional point tendon calcaneus inflammation fluid around the medial band with mild pain in the posterior heel and also severe keratotic lesion subthird metatarsal right painful when pressed with thickness     Assessment:  Acute Planter fasciitis right plantarflexed metatarsal with lesion formation right and generalized diabetes with obesity     Plan:  H&P diabetic education rendered to the patient I went ahead did sterile prep injected the plantar fascial right 3 mg Kenalog 5 mg Xylocaine and sterile debridement of lesion right accomplished no iatrogenic bleeding reappoint to recheck  X-rays indicate spur formation no indication stress fracture arthritis

## 2020-07-31 ENCOUNTER — Other Ambulatory Visit: Payer: Self-pay | Admitting: Podiatry

## 2020-07-31 DIAGNOSIS — M722 Plantar fascial fibromatosis: Secondary | ICD-10-CM

## 2020-08-02 DIAGNOSIS — R809 Proteinuria, unspecified: Secondary | ICD-10-CM | POA: Insufficient documentation

## 2020-08-02 DIAGNOSIS — R3129 Other microscopic hematuria: Secondary | ICD-10-CM | POA: Insufficient documentation

## 2020-08-09 ENCOUNTER — Ambulatory Visit (INDEPENDENT_AMBULATORY_CARE_PROVIDER_SITE_OTHER): Payer: Medicare Other | Admitting: *Deleted

## 2020-08-09 DIAGNOSIS — J309 Allergic rhinitis, unspecified: Secondary | ICD-10-CM | POA: Diagnosis not present

## 2020-08-13 ENCOUNTER — Ambulatory Visit: Payer: Medicare Other

## 2020-08-15 ENCOUNTER — Ambulatory Visit: Payer: Medicare HMO | Admitting: Allergy

## 2020-08-17 ENCOUNTER — Ambulatory Visit: Payer: Medicare Other | Admitting: Cardiology

## 2020-08-23 ENCOUNTER — Other Ambulatory Visit: Payer: Self-pay

## 2020-08-23 ENCOUNTER — Ambulatory Visit
Admission: RE | Admit: 2020-08-23 | Discharge: 2020-08-23 | Disposition: A | Discharge status: Home / Self Care | Payer: Medicare Other | Source: Ambulatory Visit | Attending: Physician Assistant | Admitting: Physician Assistant

## 2020-08-23 DIAGNOSIS — Z1231 Encounter for screening mammogram for malignant neoplasm of breast: Secondary | ICD-10-CM

## 2020-08-28 ENCOUNTER — Ambulatory Visit (INDEPENDENT_AMBULATORY_CARE_PROVIDER_SITE_OTHER): Payer: Medicare Other | Admitting: *Deleted

## 2020-08-28 DIAGNOSIS — E1159 Type 2 diabetes mellitus with other circulatory complications: Secondary | ICD-10-CM | POA: Diagnosis not present

## 2020-08-28 DIAGNOSIS — J309 Allergic rhinitis, unspecified: Secondary | ICD-10-CM

## 2020-08-28 DIAGNOSIS — I129 Hypertensive chronic kidney disease with stage 1 through stage 4 chronic kidney disease, or unspecified chronic kidney disease: Secondary | ICD-10-CM | POA: Diagnosis not present

## 2020-08-28 DIAGNOSIS — M545 Low back pain, unspecified: Secondary | ICD-10-CM | POA: Diagnosis not present

## 2020-08-28 DIAGNOSIS — N182 Chronic kidney disease, stage 2 (mild): Secondary | ICD-10-CM | POA: Diagnosis not present

## 2020-08-28 DIAGNOSIS — Z794 Long term (current) use of insulin: Secondary | ICD-10-CM | POA: Diagnosis not present

## 2020-08-28 DIAGNOSIS — E1122 Type 2 diabetes mellitus with diabetic chronic kidney disease: Secondary | ICD-10-CM | POA: Diagnosis not present

## 2020-08-28 DIAGNOSIS — N179 Acute kidney failure, unspecified: Secondary | ICD-10-CM | POA: Diagnosis not present

## 2020-08-28 DIAGNOSIS — M5416 Radiculopathy, lumbar region: Secondary | ICD-10-CM | POA: Diagnosis not present

## 2020-08-29 ENCOUNTER — Other Ambulatory Visit: Payer: Self-pay | Admitting: Physical Medicine and Rehabilitation

## 2020-08-29 DIAGNOSIS — M545 Low back pain, unspecified: Secondary | ICD-10-CM

## 2020-08-29 DIAGNOSIS — R29898 Other symptoms and signs involving the musculoskeletal system: Secondary | ICD-10-CM

## 2020-09-04 ENCOUNTER — Ambulatory Visit: Payer: Medicare Other | Admitting: Cardiology

## 2020-09-06 ENCOUNTER — Ambulatory Visit (INDEPENDENT_AMBULATORY_CARE_PROVIDER_SITE_OTHER): Payer: Medicare Other | Admitting: *Deleted

## 2020-09-06 DIAGNOSIS — J309 Allergic rhinitis, unspecified: Secondary | ICD-10-CM

## 2020-09-10 ENCOUNTER — Ambulatory Visit
Admission: RE | Admit: 2020-09-10 | Discharge: 2020-09-10 | Disposition: A | Discharge status: Home / Self Care | Payer: Medicare Other | Source: Ambulatory Visit | Attending: Physical Medicine and Rehabilitation | Admitting: Physical Medicine and Rehabilitation

## 2020-09-10 ENCOUNTER — Other Ambulatory Visit: Payer: Self-pay

## 2020-09-10 DIAGNOSIS — M545 Low back pain, unspecified: Secondary | ICD-10-CM

## 2020-09-10 DIAGNOSIS — R29898 Other symptoms and signs involving the musculoskeletal system: Secondary | ICD-10-CM

## 2020-09-12 ENCOUNTER — Ambulatory Visit (INDEPENDENT_AMBULATORY_CARE_PROVIDER_SITE_OTHER): Payer: Medicare Other

## 2020-09-12 ENCOUNTER — Other Ambulatory Visit: Payer: Self-pay

## 2020-09-12 ENCOUNTER — Encounter: Payer: Self-pay | Admitting: Cardiology

## 2020-09-12 ENCOUNTER — Ambulatory Visit: Payer: Medicare Other | Admitting: Cardiology

## 2020-09-12 VITALS — BP 150/76 | HR 87 | Temp 97.7°F | Resp 17 | Ht 65.0 in | Wt 245.8 lb

## 2020-09-12 DIAGNOSIS — J309 Allergic rhinitis, unspecified: Secondary | ICD-10-CM

## 2020-09-12 DIAGNOSIS — E7800 Pure hypercholesterolemia, unspecified: Secondary | ICD-10-CM

## 2020-09-12 DIAGNOSIS — I739 Peripheral vascular disease, unspecified: Secondary | ICD-10-CM

## 2020-09-12 DIAGNOSIS — N1832 Chronic kidney disease, stage 3b: Secondary | ICD-10-CM | POA: Diagnosis not present

## 2020-09-12 DIAGNOSIS — Z794 Long term (current) use of insulin: Secondary | ICD-10-CM | POA: Diagnosis not present

## 2020-09-12 DIAGNOSIS — E1122 Type 2 diabetes mellitus with diabetic chronic kidney disease: Secondary | ICD-10-CM | POA: Diagnosis not present

## 2020-09-12 DIAGNOSIS — R0609 Other forms of dyspnea: Secondary | ICD-10-CM | POA: Diagnosis not present

## 2020-09-12 DIAGNOSIS — I5031 Acute diastolic (congestive) heart failure: Secondary | ICD-10-CM | POA: Diagnosis not present

## 2020-09-12 DIAGNOSIS — I2510 Atherosclerotic heart disease of native coronary artery without angina pectoris: Secondary | ICD-10-CM | POA: Diagnosis not present

## 2020-09-12 DIAGNOSIS — I10 Essential (primary) hypertension: Secondary | ICD-10-CM

## 2020-09-12 DIAGNOSIS — R0600 Dyspnea, unspecified: Secondary | ICD-10-CM

## 2020-09-12 MED ORDER — TORSEMIDE 20 MG PO TABS: 20.0000 mg | ORAL_TABLET | Freq: Every day | ORAL | Status: DC

## 2020-09-12 NOTE — Progress Notes (Signed)
Primary Physician/Referring:  Trey Sailors, PA  Patient ID: Katrina Vega, female    DOB: 05-20-1955, 65 y.o.   MRN: [id number]  No chief complaint on file.  HPI:    Katrina Vega  is a 65 y.o. Female patient with coronary artery disease and history of PCI to the LAD and ramus in 2008 with implantation of Taxus stents and NSTEMI SP RCA PCI in 4098, chronic diastolic heart failure, hyperlipidemia, hypertension, diabetes mellitus with stage III chronic kidney disease, morbid obesity, OSA unable to tolerate CPAP, chronic renal insufficiency, peripheral arterial disease with right posterior tibial DES stent placed in 2008 and bilateral SFA stenting sometime in 2016 and atherectomy for ISR in 2019 while she lived in [place], [place].   I last seen her about 6 months ago.  Recently she has had acute renal failure with worsening renal function and was evaluated by endocrinology and also nephrology, since then has discontinued Jardiance, metformin and also torsemide.  She is developing worsening shortness of breath and feels her abdomen is now bloated.  Since last office visit, she is also made lifestyle changes and has been losing weight.  She was supposed to have seen me 2 months after the initial visit.  She is also complaining of severe pain in her right calf and right thigh and hip, states that it reminds her of her PAD.  It is worsening, has been evaluated by orthopedics and no etiology has been found.  She has chronic shortness of breath and dyspnea on exertion.  After recent move she has noticed slight worsening dyspnea but overall states that she is doing well.  She has not had any chest pain or palpitations, symptoms of claudication especially right calf is very mild and stable.  Past Medical History:  Diagnosis Date   Asthma    Congestive heart failure (CHF) ([place]) 2019   COPD (chronic obstructive pulmonary disease) ([place]) 2021   Diabetes ([place]) 2010   Eczema    Heart attack ([place])  2015   Heart disease 2008   History of left hip replacement 04/2018   Past Surgical History:  Procedure Laterality Date   APPENDECTOMY  1974   CARPAL TUNNEL RELEASE  2017   LUMBAR SPINE SURGERY  2010   REPLACEMENT TOTAL KNEE  2017 and 2018   TONSILLECTOMY     TOTAL ABDOMINAL HYSTERECTOMY  1989   TOTAL HIP ARTHROPLASTY  04/2018   TOTAL SHOULDER ARTHROPLASTY  11/2019   Family History  Problem Relation Age of Onset   Heart disease Mother    Breast cancer Mother    Heart attack Father    Lung cancer Father    Eczema Grandson    Allergic rhinitis Neg Hx    Angioedema Neg Hx    Asthma Neg Hx    Atopy Neg Hx    Immunodeficiency Neg Hx    Urticaria Neg Hx     Social History   Tobacco Use   Smoking status: Former    Types: Cigarettes   Smokeless tobacco: Never  Substance Use Topics   Alcohol use: Not Currently   Marital Status: Single  ROS  Review of Systems  Cardiovascular:  Positive for claudication and dyspnea on exertion. Negative for chest pain and leg swelling.  Musculoskeletal:  Positive for arthritis.  Gastrointestinal:  Negative for melena.  Objective  Blood pressure (!) 150/76, pulse 87, temperature 97.7 F (36.5 C), temperature source Temporal, resp. rate 17, height 5' 5"  (1.651 m), weight 245  lb 12.8 oz (111.5 kg), SpO2 95 %.  Vitals with BMI 09/12/2020 05/09/2020 03/23/2020  Height 5' 5"  5' 5"  5' 5"   Weight 245 lbs 13 oz 256 lbs 10 oz 260 lbs  BMI 40.9 29.5 28.41  Systolic 324 401 027  Diastolic 76 76 73  Pulse 87 79 78     Physical Exam Constitutional:      Comments: Morbidly obese in no acute distress.  Cardiovascular:     Rate and Rhythm: Normal rate and regular rhythm.     Pulses:          Carotid pulses are 2+ on the right side and 2+ on the left side.      Radial pulses are 2+ on the right side and 2+ on the left side.       Dorsalis pedis pulses are 2+ on the right side and 1+ on the left side.       Posterior tibial pulses are 0 on the right  side and 0 on the left side.     Heart sounds: Normal heart sounds. No murmur heard.   No gallop.     Comments: Femoral and popliteal pulse difficult to feel due to patient's body habitus.  No leg edema. JVD difficult to see due to short neck. Pulmonary:     Effort: Pulmonary effort is normal.     Breath sounds: Normal breath sounds.  Abdominal:     General: Bowel sounds are normal.     Palpations: Abdomen is soft.     Comments: Obese. Pannus present   Laboratory examination:   External labs:   Labs 08/28/2020:   Sodium 141, potassium 4.0, BUN 13, creatinine 1.93, EGFR 28 mL.  A1c 7.41%.  Urine analysis reveals markedly elevated urinary protein/creatinine ratio.  Labs 03/15/2020:  Total cholesterol 112, triglycerides 79, HDL 34, LDL 52.  Serum glucose 98 mg, BUN 16, creatinine 1.10, EGFR 52/61 mL.  Sodium 142, potassium 3.9, CMP otherwise normal.  Hb 12.6/HCT 40.0, WBC 6.5, platelets 407, mildly elevated.  Indicis are normal.  A1c 6.4%.  TSH normal at 1.80.  K 3.7 11/29/2019  CL 106 11/29/2019  CO2 25 11/29/2019  BUN 13 11/29/2019  CREATININE 1.29 (H) 11/29/2019  CALCIUM 9.6 11/29/2019   Lab Results  Component Value Date  HCT 32.4 (L) 11/29/2019  MCV 78.8 (L) 11/29/2019  PLT 445 (H) 11/29/2019    Labs 10/20/2019:  TSH normal, free T4 mildly elevated at 1.80 (0.60-1.60).  A1c 7.1%.  BUN 14, creatinine 1.40, EGFR 45 mill, potassium 3.8, sodium 140.  Total cholesterol 89, triglycerides 136, HDL 27, LDL 35.  Medications and allergies   Allergies  Allergen Reactions   Contrast Media [Iodinated Diagnostic Agents] Shortness Of Breath and Other (See Comments)    sob, chest tight   Other Shortness Of Breath    Other reaction(s): Other (See Comments) sob, chest tight   Morphine Itching   Statins Diarrhea and Other (See Comments)    nausea, diarrhea, myalgias     Outpatient Medications Prior to Visit  Medication Sig Dispense Refill   albuterol (VENTOLIN HFA)  108 (90 Base) MCG/ACT inhaler Inhale 2 puffs into the lungs every 6 (six) hours as needed.     azelastine (ASTELIN) 0.1 % nasal spray 1-2 sprays per nostril 2 times daily as needed for runny nose. 90 mL 2   budesonide-formoterol (SYMBICORT) 160-4.5 MCG/ACT inhaler Inhale 2 puffs into the lungs in the morning and at bedtime. with spacer and rinse mouth  afterwards. 3 each 2   celecoxib (CELEBREX) 200 MG capsule      cetirizine (ZYRTEC) 10 MG tablet Take by mouth.     clopidogrel (PLAVIX) 75 MG tablet Take 75 mg by mouth.     Continuous Blood Gluc Sensor (DEXCOM G6 SENSOR) MISC USE 1 SENSOR AS NEEDED CHANGE EVERY 10 DAYS     EPINEPHrine (EPIPEN 2-PAK) 0.3 mg/0.3 mL IJ SOAJ injection Use as directed for severe allergic reactions 2 each 3   ferrous sulfate 325 (65 FE) MG tablet Take 325 mg by mouth in the morning, at noon, and at bedtime.     gabapentin (NEURONTIN) 300 MG capsule Take 300 mg by mouth in the morning, at noon, and at bedtime.     glucagon 1 MG injection Inject into the muscle as needed.     glucose 4 GM chewable tablet Chew by mouth as needed.     HUMULIN N 100 UNIT/ML injection Inject into the skin.     insulin regular human CONCENTRATED (HUMULIN R) 500 UNIT/ML kwikpen Inject 80 Units into the skin daily.     isosorbide mononitrate (IMDUR) 60 MG 24 hr tablet Take 60 mg by mouth daily.     ketotifen (ZADITOR) 0.025 % ophthalmic solution 1 drop 2 (two) times daily.     levocetirizine (XYZAL) 5 MG tablet Take 1 tablet (5 mg total) by mouth every evening. 90 tablet 2   lisinopril (ZESTRIL) 20 MG tablet Take 20 mg by mouth daily.     loratadine (CLARITIN) 10 MG tablet Take 1 tablet by mouth daily.     metoprolol tartrate (LOPRESSOR) 50 MG tablet Take 50 mg by mouth every 12 (twelve) hours.     nitroGLYCERIN (NITROSTAT) 0.4 MG SL tablet Place under the tongue as needed.     Olopatadine HCl 0.2 % SOLN Apply 1 drop to eye daily as needed (itchy/watery eyes). 2.5 mL 5   pantoprazole  (PROTONIX) 40 MG tablet Take 40 mg by mouth in the morning and at bedtime.     psyllium (METAMUCIL) 58.6 % packet Take by mouth.     Respiratory Therapy Supplies (CARETOUCH 2 CPAP HOSE HANGER) MISC by Does not apply route.     rosuvastatin (CRESTOR) 40 MG tablet Take 40 mg by mouth at bedtime.     Semaglutide, 1 MG/DOSE, (OZEMPIC, 1 MG/DOSE,) 4 MG/3ML SOPN Inject into the skin once a week.     Tiotropium Bromide Monohydrate (SPIRIVA RESPIMAT) 1.25 MCG/ACT AERS Inhale 2 puffs into the lungs daily. 3 each 2   triamcinolone (NASACORT) 55 MCG/ACT AERO nasal inhaler as needed.     vitamin B-12 (CYANOCOBALAMIN) 500 MCG tablet Take 500 mcg by mouth daily.     empagliflozin (JARDIANCE) 10 MG TABS tablet Take 1 tablet by mouth daily. (Patient not taking: Reported on 09/12/2020)     metFORMIN (GLUCOPHAGE) 500 MG tablet Take 500 mg by mouth in the morning and at bedtime. Currently on HOLD (Patient not taking: Reported on 09/12/2020)     VALIUM 5 MG tablet SMARTSIG:1 Tablet(s) By Mouth     Vitamin D, Ergocalciferol, (DRISDOL) 1.25 MG (50000 UNIT) CAPS capsule Take 50,000 Units by mouth once a week.     aspirin 81 MG EC tablet Take 81 mg by mouth daily.     carisoprodol (SOMA) 350 MG tablet Take by mouth as needed.     clobetasol (TEMOVATE) 0.05 % external solution Apply topically as needed.     clotrimazole (LOTRIMIN) 1 % cream  diclofenac Sodium (VOLTAREN) 1 % GEL Apply topically.     fluconazole (DIFLUCAN) 150 MG tablet      furosemide (LASIX) 40 MG tablet Take 1 tablet by mouth daily.     magnesium oxide (MAG-OX) 400 MG tablet Take 400 mg by mouth daily.     MEDROL 4 MG TBPK tablet take 6 tablets 1st day, 5 tablets 2nd day, 4 tablets 3rd day, 3 tablets 4th day, 2 tablets 5th day, 1 tablet the 6th day     meloxicam (MOBIC) 15 MG tablet Take 15 mg by mouth daily.     polyethylene glycol powder (GLYCOLAX/MIRALAX) 17 GM/SCOOP powder Take by mouth as needed.     predniSONE (DELTASONE) 10 MG tablet TAKE 1  TABLET BY MOUTH EVERY DAY FOR 3 DAYS     senna-docusate (SENOKOT-S) 8.6-50 MG tablet Take 2 tablets by mouth as needed.     tiZANidine (ZANAFLEX) 4 MG tablet Take 4 mg by mouth every 6 (six) hours as needed for muscle spasms.     torsemide (DEMADEX) 20 MG tablet Take 20 mg by mouth daily. (Patient not taking: Reported on 09/12/2020)     No facility-administered medications prior to visit.    Radiology:   MR LUMBAR SPINE WO CONTRAST  Result Date: 09/11/2020 CLINICAL DATA:  Low back pain.  No known injury. EXAM: MRI LUMBAR SPINE WITHOUT CONTRAST TECHNIQUE: Multiplanar, multisequence MR imaging of the lumbar spine was performed. No intravenous contrast was administered. COMPARISON:  None. FINDINGS: Segmentation:  Standard. Alignment: Trace anterolisthesis L4 on L5 is due to facet degenerative disease. Alignment is otherwise normal. Vertebrae:  No fracture, evidence of discitis, or bone lesion. Conus medullaris and cauda equina: Conus extends to the L1-2 level. Conus and cauda equina appear normal. Paraspinal and other soft tissues: Negative. Disc levels: T10-11, T11-12 and T12-L1 are imaged in the sagittal plane only. The central canal and foramina appear open at each level. Shallow disc bulge T12-L1 noted. L1-2: Mild facet degenerative change.  Otherwise negative. L2-3: Mild-to-moderate facet degenerative disease. There is a shallow disc bulge. The central canal is open. Mild bilateral foraminal narrowing noted. L3-4: Moderate bilateral facet arthropathy. There is a shallow disc bulge and some ligamentum flavum thickening. Mild central canal and bilateral foraminal narrowing. L4-5: Moderate to moderately severe bilateral facet degenerative change. Slight disc uncovering and a minimal bulge are seen. The central canal and foramina remain open. L5-S1: Mild-to-moderate bilateral facet degenerative disease and a shallow disc bulge. No stenosis. IMPRESSION: Mild central canal and bilateral foraminal narrowing at  L3-4 where there is moderate bilateral facet arthropathy. Moderate to moderately severe facet degenerative disease at L4-5 results in trace anterolisthesis. No stenosis at L4-5. Electronically Signed   By: Inge Rise M.D.   On: 09/11/2020 14:27    Cardiac Studies:   Coronary angioplasty: -RCA PCI (Dr. Tonye Becket) 10/2011 for NSTEMI; LAD/[place]amus DES 2008 Taxus;  Coronary angiogram 12/22/2017: A- Left Main: Mild plaquing, no critical lesions seen.  B- LAD: The LAD contains mild plaquing proximally and gives rise to a large high lying 1st diagonal branch in which a 40% ostial stenosis is present. Mid LAD contains a well deployed stent which is patent, no evidence for significant restenoses. The distal LAD is very large and free of significant disease.  C- Left Circumflex: Limited views of the left circumflex were obtained due to dye issues. There appears to be a proximal left circumflex and 1st obtuse marginal stented segments which are patent and show no it evidence  for significant restenoses.  D- RCA: The RCA is small and contains a patent proximal stent with 50 to 60% mid stenosis and mild plaquing noted distally.  Peripheral arteriogram 12/22/2017:  There are bilateral SFA stents present with evidence for severe right and mild left in stent restenoses present. The bilateral popliteal arteries and proximal trifurcation vessels are patent. However, there may be moderate to severe lesion involving the mid right anterior tibial artery in particular. In addition, there was poor flow noted in the left anterior tibial artery with 2 vessel runoff probably present bilaterally. Laser atherectomy of right SFA followed by DCB with 5.0 x 120 mm impact.  Echocardiogram 07/04/2019: Normal LV size, mild LVH, hyperdynamic LVEF at >65% with grade 1 diastolic dysfunction.  Otherwise no other significant abnormality.  Poor quality due to patient body habitus.  Lexiscan nuclear stress test 07/07/2019: Nondiagnostic  EKG.  Dyspnea during stress test. No evidence of ischemia.  Soft tissue attenuation noted.  LVEF 72%.  Normal wall motion.  Low risk study.  No significant change from 12/20/2017.  Lower extremity venous duplex 07/02/2019: No evidence of DVT in bilateral lower extremity.  Lower extremity arterial duplex 12/16/2019: 1.  20-49% stenosis of the right distal common femoral artery. 2.  20-49% stenosis of the right distal femoral artery in stent; rest of  distal femoral artery stent is patent without stenosis. 3.  High grade stenosis in the right mid anterior tibial artery. 4.  The right peroneal artery is occluded. 5.  Right ABI is .93 and the left, .86. 6.  Bilateral ankle, metatarsal, and toe pressure waveforms demonstrate mild attenuation. Right Lower Arterial The right distal superficial femoral artery has a stent present.   EKG:     EKG 03/23/2020: Normal sinus rhythm at rate of 74 bpm, normal axis, poor R wave progression, cannot exclude anteroseptal infarct old.  No evidence of ischemia.     Assessment     ICD-10-CM   1. Coronary artery disease involving native coronary artery of native heart without angina pectoris  I25.10     2. Acute diastolic heart failure (HCC)  I50.31 torsemide (DEMADEX) 20 MG tablet    3. Dyspnea on exertion  R06.00 Brain natriuretic peptide    4. Primary hypertension  O17 Basic metabolic panel    torsemide (DEMADEX) 20 MG tablet    5. Type 2 diabetes mellitus with stage 3b chronic kidney disease, with long-term current use of insulin (HCC)  E11.22    N18.32    Z79.4     6. Hypercholesteremia  E78.00     7. PAD (peripheral artery disease) (HCC)  I73.9 PCV AORTA DUPLEX    PCV LOWER ARTERIAL (BILATERAL)      Meds ordered this encounter  Medications   torsemide (DEMADEX) 20 MG tablet    Sig: Take 1 tablet (20 mg total) by mouth daily.    Orders Placed This Encounter  Procedures   Basic metabolic panel   Brain natriuretic peptide    Recommendations:   LUCIE FRIEDLANDER is a 65 y.o. Female patient with coronary artery disease and history of PCI to the LAD and ramus in 2008 with implantation of Taxus stents and NSTEMI SP RCA PCI in 5102, chronic diastolic heart failure, hyperlipidemia, hypertension, diabetes mellitus with stage III chronic kidney disease, morbid obesity, OSA unable to tolerate CPAP, chronic renal insufficiency, peripheral arterial disease with right posterior tibial DES stent placed in 2008 and bilateral SFA stenting sometime in 2016 and atherectomy for ISR  in 2019  I last seen her about 6 months ago.  Recently she has had acute renal failure with worsening renal function and was evaluated by endocrinology and also nephrology, since then has discontinued Jardiance, metformin and also torsemide.  She is developing worsening shortness of breath and feels her abdomen is now bloated.  She is in acute decompensated heart failure, diastolic we will obtain a BNP.  Advised her to restart torsemide daily.  Do not know the reasons for her acute renal failure.  The latest medication that was started was probably Jardiance and diuresis may have led to acute renal failure.  I will obtain a BMP also today.  Hopefully with torsemide, blood pressure will also improve.  She is now having severe symptoms of leg cramps and has been evaluated by orthopedics and not found to have any significant etiology. The right hip claudication appears to be related to PAD, will obtain abdominal aortic duplex and also lower extremity arterial Dopplers in view of prior history of angioplasty.  I would like to see him back in 6 to 8 weeks for follow-up.  I reviewed her external labs and updated them.  This was a 40-minute office visit encounter.    Adrian Prows, MD, [facility] 09/12/2020, 5:25 PM Office: [phone]

## 2020-09-13 DIAGNOSIS — H2512 Age-related nuclear cataract, left eye: Secondary | ICD-10-CM | POA: Diagnosis not present

## 2020-09-13 DIAGNOSIS — H25812 Combined forms of age-related cataract, left eye: Secondary | ICD-10-CM | POA: Diagnosis not present

## 2020-09-13 DIAGNOSIS — H25012 Cortical age-related cataract, left eye: Secondary | ICD-10-CM | POA: Diagnosis not present

## 2020-09-14 DIAGNOSIS — E119 Type 2 diabetes mellitus without complications: Secondary | ICD-10-CM | POA: Diagnosis not present

## 2020-09-20 ENCOUNTER — Ambulatory Visit (INDEPENDENT_AMBULATORY_CARE_PROVIDER_SITE_OTHER): Payer: Medicare Other | Admitting: *Deleted

## 2020-09-20 DIAGNOSIS — J309 Allergic rhinitis, unspecified: Secondary | ICD-10-CM

## 2020-09-22 NOTE — Patient Instructions (Addendum)
Asthma/COPD overlap Daily controller medication(s):  START Breztri 2 puffs twice a day with spacer and rinse mouth afterwards. Sample given and coupon given. If not covered then go back to using  Symbicort 144mg 2 puffs twice a day with spacer and rinse mouth afterwards AND  Spiriva 1.240m 2 puffs once a day.  May use albuterol rescue inhaler 2 puffs every 4 to 6 hours as needed for shortness of breath, chest tightness, coughing, and wheezing. May use albuterol rescue inhaler 2 puffs 5 to 15 minutes prior to strenuous physical activities. Monitor frequency of use.  Asthma control goals:  Full participation in all desired activities (may need albuterol before activity) Albuterol use two times or less a week on average (not counting use with activity) Cough interfering with sleep two times or less a month Oral steroids no more than once a year No hospitalizations  Seasonal and perennial allergic rhinoconjunctivitis Continue environmental control measures directed towards dust mite, cockroach, weeds, cat, and dog. Continue allergy injections - given today. Use over the counter antihistamines such as Xyzal (levocetirizine) daily as needed. May take twice a day during allergy flares. May switch antihistamines every few months. Use azelastine nasal spray 1-2 sprays per nostril twice a day as needed for runny nose/drainage. Use Nasacort (triamcinolone) nasal spray 1 spray per nostril twice a day as needed for nasal congestion.  Nasal saline spray (i.e., Simply Saline) or nasal saline lavage (i.e., NeilMed) is recommended as needed and prior to medicated nasal sprays. Use cromolyn 4% 1 drop in each eye up to four times a day as needed for itchy/watery eyes.  May use refresh eye drops as needed.  Follow up in 4 months or sooner if needed.   Control of House Dust Mite Allergen Dust mite allergens are a common trigger of allergy and asthma symptoms. While they can be found throughout the house,  these microscopic creatures thrive in warm, humid environments such as bedding, upholstered furniture and carpeting. Because so much time is spent in the bedroom, it is essential to reduce mite levels there.  Encase pillows, mattresses, and box springs in special allergen-proof fabric covers or airtight, zippered plastic covers.  Bedding should be washed weekly in hot water (130 F) and dried in a hot dryer. Allergen-proof covers are available for comforters and pillows that can't be regularly washed.  Wash the allergy-proof covers every few months. Minimize clutter in the bedroom. Keep pets out of the bedroom.  Keep humidity less than 50% by using a dehumidifier or air conditioning. You can buy a humidity measuring device called a hygrometer to monitor this.  If possible, replace carpets with hardwood, linoleum, or washable area rugs. If that's not possible, vacuum frequently with a vacuum that has a HEPA filter. Remove all upholstered furniture and non-washable window drapes from the bedroom. Remove all non-washable stuffed toys from the bedroom.  Wash stuffed toys weekly. Reducing Pollen Exposure Pollen seasons: trees (spring), grass (summer) and ragweed/weeds (fall). Keep windows closed in your home and car to lower pollen exposure.  Install air conditioning in the bedroom and throughout the house if possible.  Avoid going out in dry windy days - especially early morning. Pollen counts are highest between 5 - 10 AM and on dry, hot and windy days.  Save outside activities for late afternoon or after a heavy rain, when pollen levels are lower.  Avoid mowing of grass if you have grass pollen allergy. Be aware that pollen can also be transported indoors on  people and pets.  Dry your clothes in an automatic dryer rather than hanging them outside where they might collect pollen.  Rinse hair and eyes before bedtime. Pet Allergen Avoidance: Contrary to popular opinion, there are no "hypoallergenic"  breeds of dogs or cats. That is because people are not allergic to an animal's hair, but to an allergen found in the animal's saliva, dander (dead skin flakes) or urine. Pet allergy symptoms typically occur within minutes. For some people, symptoms can build up and become most severe 8 to 12 hours after contact with the animal. People with severe allergies can experience reactions in public places if dander has been transported on the pet owners' clothing. Keeping an animal outdoors is only a partial solution, since homes with pets in the yard still have higher concentrations of animal allergens. Before getting a pet, ask your allergist to determine if you are allergic to animals. If your pet is already considered part of your family, try to minimize contact and keep the pet out of the bedroom and other rooms where you spend a great deal of time. As with dust mites, vacuum carpets often or replace carpet with a hardwood floor, tile or linoleum. High-efficiency particulate air (HEPA) cleaners can reduce allergen levels over time. While dander and saliva are the source of cat and dog allergens, urine is the source of allergens from rabbits, hamsters, mice and [place] pigs; so ask a non-allergic family member to clean the animal's cage. If you have a pet allergy, talk to your allergist about the potential for allergy immunotherapy (allergy shots). This strategy can often provide long-term relief. Mold Control Mold and fungi can grow on a variety of surfaces provided certain temperature and moisture conditions exist.  Outdoor molds grow on plants, decaying vegetation and soil. The major outdoor mold, Alternaria and Cladosporium, are found in very high numbers during hot and dry conditions. Generally, a late summer - fall peak is seen for common outdoor fungal spores. Rain will temporarily lower outdoor mold spore count, but counts rise rapidly when the rainy period ends. The most important indoor molds are  Aspergillus and Penicillium. Dark, humid and poorly ventilated basements are ideal sites for mold growth. The next most common sites of mold growth are the bathroom and the kitchen. Outdoor (Seasonal) Mold Control Use air conditioning and keep windows closed. Avoid exposure to decaying vegetation. Avoid leaf raking. Avoid grain handling. Consider wearing a face mask if working in moldy areas.  Indoor (Perennial) Mold Control  Maintain humidity below 50%. Get rid of mold growth on hard surfaces with water, detergent and, if necessary, 5% bleach (do not mix with other cleaners). Then dry the area completely. If mold covers an area more than 10 square feet, consider hiring an indoor environmental professional. For clothing, washing with soap and water is best. If moldy items cannot be cleaned and dried, throw them away. Remove sources e.g. contaminated carpets. Repair and seal leaking roofs or pipes. Using dehumidifiers in damp basements may be helpful, but empty the water and clean units regularly to prevent mildew from forming. All rooms, especially basements, bathrooms and kitchens, require ventilation and cleaning to deter mold and mildew growth. Avoid carpeting on concrete or damp floors, and storing items in damp areas.

## 2020-09-24 ENCOUNTER — Encounter: Payer: Self-pay | Admitting: Allergy

## 2020-09-24 ENCOUNTER — Other Ambulatory Visit: Payer: Self-pay

## 2020-09-24 ENCOUNTER — Ambulatory Visit (INDEPENDENT_AMBULATORY_CARE_PROVIDER_SITE_OTHER): Payer: Medicare Other | Admitting: Allergy

## 2020-09-24 VITALS — BP 128/78 | HR 81 | Temp 97.5°F | Resp 16 | Ht 65.0 in | Wt 242.4 lb

## 2020-09-24 DIAGNOSIS — J309 Allergic rhinitis, unspecified: Secondary | ICD-10-CM | POA: Diagnosis not present

## 2020-09-24 DIAGNOSIS — J449 Chronic obstructive pulmonary disease, unspecified: Secondary | ICD-10-CM

## 2020-09-24 DIAGNOSIS — J3089 Other allergic rhinitis: Secondary | ICD-10-CM

## 2020-09-24 DIAGNOSIS — J302 Other seasonal allergic rhinitis: Secondary | ICD-10-CM | POA: Diagnosis not present

## 2020-09-24 DIAGNOSIS — H1013 Acute atopic conjunctivitis, bilateral: Secondary | ICD-10-CM

## 2020-09-24 DIAGNOSIS — R12 Heartburn: Secondary | ICD-10-CM | POA: Diagnosis not present

## 2020-09-24 DIAGNOSIS — H1010 Acute atopic conjunctivitis, unspecified eye: Secondary | ICD-10-CM

## 2020-09-24 MED ORDER — CROMOLYN SODIUM 4 % OP SOLN: 1.0000 [drp] | Freq: Four times a day (QID) | OPHTHALMIC | 3 refills | Status: DC | PRN

## 2020-09-24 MED ORDER — BREZTRI AEROSPHERE 160-9-4.8 MCG/ACT IN AERO: 2.0000 | INHALATION_SPRAY | Freq: Two times a day (BID) | RESPIRATORY_TRACT | 5 refills | Status: DC

## 2020-09-24 NOTE — Assessment & Plan Note (Signed)
   Continue lifestyle and dietary modifications.  Continue Protonix '40mg'$  twice a day as prescribed.

## 2020-09-24 NOTE — Progress Notes (Signed)
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 She is a 65 y.o. female, who is being followed for allergic rhinoconjunctivitis on AIT, asthma/COPD overlap syndrome. Her previous allergy office visit was on 05/09/2020 with Dr. Maudie Mercury. Today is a regular follow up visit.  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  Patient had Covid-19 in June and her breathing is back to baseline but she did notice some voice hoarseness at the end of the day.she is not sure why that is happening.   Currently on 175mg 2 puffs twice a day and Spiriva 1.250m 2 puffs once a day with good benefit. Sometimes has to use albuterol at work when clients come in with strong cigarette smell or fragrances with good benefit.  Denies any ER/urgent care visits or prednisone use since the last visit.   Seasonal and perennial allergic rhinoconjunctivitis Currently taking Xyzal at night, azelastine 2 sprays once a day, Zaditor eye drops a few times per day per week. Patient just had cataract surgery 2-3 weeks ago.   Patient does complain of increased PND.  Stopped Nasacort as she didn't realized it could be used with azelastine nasal spray.   Doing well with injections and no issues. Noticed some improvement in symptoms.   Takes medications for reflux.   Assessment and Plan: [name] a [age].o. female with: Asthma-COPD overlap syndrome ([place]. Using albuterol when exposed to smoke or strong scents with good  benefit.  Today's spirometry showed some restriction.  ACT score 21.  Daily controller medication(s):  START Breztri 2 puffs twice a day with spacer and rinse mouth afterwards. Sample given and coupon given. This should simplify her inhaler regimen.  If not covered then go back to using: Symbicort 16022m2 puffs twice a day with spacer and rinse mouth afterwards AND  Spiriva 1.38m15m puffs once a day.  May use albuterol rescue inhaler 2 puffs every 4 to 6 hours as needed for shortness of breath, chest tightness, coughing, and wheezing. May use albuterol rescue inhaler 2 puffs 5 to 15 minutes prior to strenuous physical activities. Monitor frequency of use.  Get spirometry at next visit.  Seasonal and perennial allergic rhinoconjunctivitis Past history - [facility] [place] [facility] [place] [facility] Interim history - increased PND, had cataract surgery. Continue environmental control measures directed towards dust mite, cockroach, weeds, cat, and dog. Continue allergy injections - given today. Use over the counter antihistamines such as Xyzal (levocetirizine) daily as needed. May take twice a day during allergy flares. May switch antihistamines every few months. Use azelastine nasal spray 1-2 sprays per nostril twice a day as needed for runny nose/drainage. Use Nasacort (triamcinolone) nasal spray 1 spray per nostril twice a day as needed for nasal congestion.  Nasal saline spray (i.e., Simply Saline) or nasal saline lavage (i.e., NeilMed) is recommended as needed and prior to medicated nasal sprays. Use cromolyn 4% 1 drop in each  eye up to four times a day as needed for itchy/watery eyes.  May use refresh eye drops as needed.  Heartburn Continue lifestyle and dietary modifications. Continue Protonix '40mg'$  twice a day as prescribed.   Return in about 4 months (around 01/24/2021).  Meds ordered this encounter  Medications   Budeson-Glycopyrrol-Formoterol (BREZTRI  AEROSPHERE) 160-9-4.8 MCG/ACT AERO    Sig: Inhale 2 puffs into the lungs in the morning and at bedtime.    Dispense:  10.7 g    Refill:  5   cromolyn (OPTICROM) 4 % ophthalmic solution    Sig: Place 1 drop into both eyes 4 (four) times daily as needed (itchy/watery eyes).    Dispense:  10 mL    Refill:  3    Lab Orders  No laboratory test(s) ordered today    Diagnostics: Spirometry:  Tracings reviewed. Her effort: Good reproducible efforts. FVC: 2.07L FEV1: 1.83L, 86% predicted FEV1/FVC ratio: 88% Interpretation: Spirometry consistent with mild restriction. Please see scanned spirometry results for details.  Medication List:  Current Outpatient Medications  Medication Sig Dispense Refill   albuterol (VENTOLIN HFA) 108 (90 Base) MCG/ACT inhaler Inhale 2 puffs into the lungs every 6 (six) hours as needed.     azelastine (ASTELIN) 0.1 % nasal spray 1-2 sprays per nostril 2 times daily as needed for runny nose. 90 mL 2   Budeson-Glycopyrrol-Formoterol (BREZTRI AEROSPHERE) 160-9-4.8 MCG/ACT AERO Inhale 2 puffs into the lungs in the morning and at bedtime. 10.7 g 5   budesonide-formoterol (SYMBICORT) 160-4.5 MCG/ACT inhaler Inhale 2 puffs into the lungs in the morning and at bedtime. with spacer and rinse mouth afterwards. 3 each 2   celecoxib (CELEBREX) 200 MG capsule      clopidogrel (PLAVIX) 75 MG tablet Take 75 mg by mouth.     Continuous Blood Gluc Sensor (DEXCOM G6 SENSOR) MISC USE 1 SENSOR AS NEEDED CHANGE EVERY 10 DAYS     cromolyn (OPTICROM) 4 % ophthalmic solution Place 1 drop into both eyes 4 (four) times daily as needed (itchy/watery eyes). 10 mL 3   empagliflozin (JARDIANCE) 10 MG TABS tablet Take 1 tablet by mouth daily.     EPINEPHrine (EPIPEN 2-PAK) 0.3 mg/0.3 mL IJ SOAJ injection Use as directed for severe allergic reactions 2 each 3   ferrous sulfate 325 (65 FE) MG tablet Take 325 mg by mouth in the morning, at noon, and at bedtime.     gabapentin (NEURONTIN) 300 MG  capsule Take 300 mg by mouth in the morning, at noon, and at bedtime.     glucagon 1 MG injection Inject into the muscle as needed.     glucose 4 GM chewable tablet Chew by mouth as needed.     HUMULIN N 100 UNIT/ML injection Inject into the skin.     insulin regular human CONCENTRATED (HUMULIN R) 500 UNIT/ML kwikpen Inject 80 Units into the skin daily.     isosorbide mononitrate (IMDUR) 60 MG 24 hr tablet Take 60 mg by mouth daily.     ketotifen (ZADITOR) 0.025 % ophthalmic solution 1 drop 2 (two) times daily.     levocetirizine (XYZAL) 5 MG tablet Take 1 tablet (5 mg total) by mouth every evening. 90 tablet 2   lisinopril (ZESTRIL) 20 MG tablet Take 20 mg by mouth daily.     metFORMIN (GLUCOPHAGE) 500 MG tablet Take 500 mg by mouth in the morning and at bedtime. Currently on HOLD     metoprolol tartrate (LOPRESSOR) 50 MG tablet Take  50 mg by mouth every 12 (twelve) hours.     nitroGLYCERIN (NITROSTAT) 0.4 MG SL tablet Place under the tongue as needed.     pantoprazole (PROTONIX) 40 MG tablet Take 40 mg by mouth in the morning and at bedtime.     psyllium (METAMUCIL) 58.6 % packet Take by mouth.     Respiratory Therapy Supplies (CARETOUCH 2 CPAP HOSE HANGER) MISC by Does not apply route.     rosuvastatin (CRESTOR) 40 MG tablet Take 40 mg by mouth at bedtime.     Semaglutide, 1 MG/DOSE, (OZEMPIC, 1 MG/DOSE,) 4 MG/3ML SOPN Inject into the skin once a week.     Tiotropium Bromide Monohydrate (SPIRIVA RESPIMAT) 1.25 MCG/ACT AERS Inhale 2 puffs into the lungs daily. 3 each 2   torsemide (DEMADEX) 20 MG tablet Take 1 tablet (20 mg total) by mouth daily.     vitamin B-12 (CYANOCOBALAMIN) 500 MCG tablet Take 500 mcg by mouth daily.     Vitamin D, Ergocalciferol, (DRISDOL) 1.25 MG (50000 UNIT) CAPS capsule Take 50,000 Units by mouth once a week.     Olopatadine HCl 0.2 % SOLN Apply 1 drop to eye daily as needed (itchy/watery eyes). (Patient not taking: Reported on 09/24/2020) 2.5 mL 5   triamcinolone  (NASACORT) 55 MCG/ACT AERO nasal inhaler as needed. (Patient not taking: Reported on 09/24/2020)     VALIUM 5 MG tablet SMARTSIG:1 Tablet(s) By Mouth (Patient not taking: Reported on 09/24/2020)     No current facility-administered medications for this visit.   Allergies: Allergies  Allergen Reactions   Contrast Media [Iodinated Diagnostic Agents] Shortness Of Breath and Other (See Comments)    sob, chest tight   Other Shortness Of Breath    Other reaction(s): Other (See Comments) sob, chest tight   Morphine Itching   Statins Diarrhea and Other (See Comments)    nausea, diarrhea, myalgias   I reviewed her past medical history, social history, family history, and environmental history and no significant changes have been reported from her previous visit.  Review of Systems  Constitutional:  Negative for appetite change, chills, fever and unexpected weight change.  HENT:  Positive for postnasal drip. Negative for congestion and rhinorrhea.   Eyes:  Negative for itching.  Respiratory:  Negative for cough, chest tightness, shortness of breath and wheezing.   Gastrointestinal:  Negative for abdominal pain.  Skin:  Negative for rash.  Allergic/Immunologic: Positive for environmental allergies.  Neurological:  Negative for headaches.   Objective: BP 128/78   Pulse 81   Temp (!) 97.5 F (36.4 C)   Resp 16   Ht '5\' 5"'$  (1.651 m)   Wt 242 lb 6.4 oz (110 kg)   SpO2 95%   BMI 40.34 kg/m  Body mass index is 40.34 kg/m. Physical Exam Vitals and nursing note reviewed.  Constitutional:      Appearance: Normal appearance. She is well-developed.  HENT:     Head: Normocephalic and atraumatic.     Right Ear: External ear normal.     Left Ear: External ear normal.     Nose: Nose normal.     Mouth/Throat:     Mouth: Mucous membranes are moist.     Pharynx: Oropharynx is clear.  Eyes:     Conjunctiva/sclera: Conjunctivae normal.  Cardiovascular:     Rate and Rhythm: Normal rate and  regular rhythm.     Heart sounds: Normal heart sounds. No murmur heard. Pulmonary:     Effort: Pulmonary effort is normal.  Breath sounds: Normal breath sounds. No wheezing, rhonchi or rales.  Musculoskeletal:     Cervical back: Neck supple.  Skin:    General: Skin is warm.     Findings: No rash.  Neurological:     Mental Status: She is alert and oriented to person, place, and time.  Psychiatric:        Behavior: Behavior normal.   Previous notes and tests were reviewed. The plan was reviewed with the patient/family, and all questions/concerned were addressed.  It was my pleasure to see Katrina Vega today and participate in her care. Please feel free to contact me with any questions or concerns.  Sincerely,  Rexene Alberts, DO Allergy & Immunology  [facility] office: [place] office: [phone]

## 2020-09-24 NOTE — Assessment & Plan Note (Addendum)
Had Covid-19 in June. Using albuterol when exposed to smoke or strong scents with good benefit.   Today's spirometry showed some restriction.   ACT score 21.  . Daily controller medication(s):  . START Breztri 2 puffs twice a day with spacer and rinse mouth afterwards. Sample given and coupon given. This should simplify her inhaler regimen.  o If not covered then go back to using: o Symbicort 124mg 2 puffs twice a day with spacer and rinse mouth afterwards AND  o Spiriva 1.217m 2 puffs once a day.  . May use albuterol rescue inhaler 2 puffs every 4 to 6 hours as needed for shortness of breath, chest tightness, coughing, and wheezing. May use albuterol rescue inhaler 2 puffs 5 to 15 minutes prior to strenuous physical activities. Monitor frequency of use.  . Get spirometry at next visit.

## 2020-09-24 NOTE — Assessment & Plan Note (Addendum)
Past history - started [place] in [place] in 2021 [facility]). Interim history - increased PND, had cataract surgery. . Continue environmental control measures directed towards dust mite, cockroach, weeds, cat, and dog. . Continue allergy injections - given today. . Use over the counter antihistamines such as Xyzal (levocetirizine) daily as needed. May take twice a day during allergy flares. May switch antihistamines every few months. . Use azelastine nasal spray 1-2 sprays per nostril twice a day as needed for runny nose/drainage. . Use Nasacort (triamcinolone) nasal spray 1 spray per nostril twice a day as needed for nasal congestion.  . Nasal saline spray (i.e., Simply Saline) or nasal saline lavage (i.e., NeilMed) is recommended as needed and prior to medicated nasal sprays. . Use cromolyn 4% 1 drop in each eye up to four times a day as needed for itchy/watery eyes.  . May use refresh eye drops as needed.

## 2020-09-26 ENCOUNTER — Other Ambulatory Visit: Payer: Medicare Other

## 2020-09-27 ENCOUNTER — Other Ambulatory Visit: Payer: Medicare Other

## 2020-09-27 DIAGNOSIS — M47816 Spondylosis without myelopathy or radiculopathy, lumbar region: Secondary | ICD-10-CM | POA: Diagnosis not present

## 2020-10-04 ENCOUNTER — Ambulatory Visit (INDEPENDENT_AMBULATORY_CARE_PROVIDER_SITE_OTHER): Payer: Medicare Other | Admitting: *Deleted

## 2020-10-04 DIAGNOSIS — J309 Allergic rhinitis, unspecified: Secondary | ICD-10-CM

## 2020-10-08 DIAGNOSIS — M47816 Spondylosis without myelopathy or radiculopathy, lumbar region: Secondary | ICD-10-CM | POA: Diagnosis not present

## 2020-10-09 ENCOUNTER — Ambulatory Visit: Payer: Medicare Other

## 2020-10-09 ENCOUNTER — Other Ambulatory Visit: Payer: Medicare Other

## 2020-10-09 ENCOUNTER — Other Ambulatory Visit: Payer: Self-pay

## 2020-10-09 DIAGNOSIS — I739 Peripheral vascular disease, unspecified: Secondary | ICD-10-CM

## 2020-10-09 DIAGNOSIS — R0989 Other specified symptoms and signs involving the circulatory and respiratory systems: Secondary | ICD-10-CM | POA: Diagnosis not present

## 2020-10-09 HISTORY — PX: OTHER SURGICAL HISTORY: SHX169

## 2020-10-10 DIAGNOSIS — N179 Acute kidney failure, unspecified: Secondary | ICD-10-CM | POA: Diagnosis not present

## 2020-10-10 DIAGNOSIS — N1832 Chronic kidney disease, stage 3b: Secondary | ICD-10-CM | POA: Diagnosis not present

## 2020-10-10 DIAGNOSIS — N2581 Secondary hyperparathyroidism of renal origin: Secondary | ICD-10-CM | POA: Insufficient documentation

## 2020-10-10 DIAGNOSIS — E1122 Type 2 diabetes mellitus with diabetic chronic kidney disease: Secondary | ICD-10-CM | POA: Diagnosis not present

## 2020-10-10 DIAGNOSIS — I5032 Chronic diastolic (congestive) heart failure: Secondary | ICD-10-CM | POA: Diagnosis not present

## 2020-10-10 DIAGNOSIS — E559 Vitamin D deficiency, unspecified: Secondary | ICD-10-CM | POA: Insufficient documentation

## 2020-10-10 DIAGNOSIS — I129 Hypertensive chronic kidney disease with stage 1 through stage 4 chronic kidney disease, or unspecified chronic kidney disease: Secondary | ICD-10-CM | POA: Diagnosis not present

## 2020-10-10 DIAGNOSIS — R809 Proteinuria, unspecified: Secondary | ICD-10-CM | POA: Diagnosis not present

## 2020-10-10 DIAGNOSIS — Z8616 Personal history of COVID-19: Secondary | ICD-10-CM | POA: Insufficient documentation

## 2020-10-10 DIAGNOSIS — R3129 Other microscopic hematuria: Secondary | ICD-10-CM | POA: Diagnosis not present

## 2020-10-10 DIAGNOSIS — N183 Chronic kidney disease, stage 3 unspecified: Secondary | ICD-10-CM | POA: Diagnosis not present

## 2020-10-12 ENCOUNTER — Ambulatory Visit: Payer: Medicare Other | Admitting: Cardiology

## 2020-10-12 ENCOUNTER — Other Ambulatory Visit: Payer: Self-pay

## 2020-10-12 ENCOUNTER — Encounter: Payer: Self-pay | Admitting: Cardiology

## 2020-10-12 ENCOUNTER — Ambulatory Visit (INDEPENDENT_AMBULATORY_CARE_PROVIDER_SITE_OTHER): Payer: Medicare Other

## 2020-10-12 VITALS — BP 132/75 | HR 90 | Temp 98.7°F | Resp 17 | Ht 65.0 in | Wt 248.4 lb

## 2020-10-12 DIAGNOSIS — E1122 Type 2 diabetes mellitus with diabetic chronic kidney disease: Secondary | ICD-10-CM | POA: Diagnosis not present

## 2020-10-12 DIAGNOSIS — I10 Essential (primary) hypertension: Secondary | ICD-10-CM | POA: Diagnosis not present

## 2020-10-12 DIAGNOSIS — N1831 Chronic kidney disease, stage 3a: Secondary | ICD-10-CM

## 2020-10-12 DIAGNOSIS — I739 Peripheral vascular disease, unspecified: Secondary | ICD-10-CM

## 2020-10-12 DIAGNOSIS — I5032 Chronic diastolic (congestive) heart failure: Secondary | ICD-10-CM | POA: Diagnosis not present

## 2020-10-12 DIAGNOSIS — J309 Allergic rhinitis, unspecified: Secondary | ICD-10-CM

## 2020-10-12 DIAGNOSIS — Z794 Long term (current) use of insulin: Secondary | ICD-10-CM

## 2020-10-12 MED ORDER — TORSEMIDE 20 MG PO TABS: 20.0000 mg | ORAL_TABLET | Freq: Every day | ORAL | Status: DC | PRN

## 2020-10-12 NOTE — Progress Notes (Signed)
Primary Physician/Referring:  Marrian Salvage, FNP  Patient ID: Katrina Vega, female    DOB: 30-Nov-1955, 65 y.o.   MRN: [id number]  Chief Complaint  Patient presents with   Follow-up    6 WEEKS    HPI:    Katrina Vega  is a 65 y.o. Female patient with coronary artery disease and history of PCI to the LAD and ramus in 2008 with implantation of Taxus stents and NSTEMI SP RCA PCI in 4132, chronic diastolic heart failure, hyperlipidemia, hypertension, diabetes mellitus with stage III chronic kidney disease, morbid obesity, OSA unable to tolerate CPAP, chronic renal insufficiency, peripheral arterial disease with right posterior tibial DES stent placed in 2008 and bilateral SFA stenting sometime in 2016 and atherectomy for ISR in 2019.  She presents for a 6-week office visit, states that she is now feeling better Recently she has had acute renal failure with worsening renal function and was evaluated by endocrinology and also nephrology, she was evaluated by nephrology and endocrinology, 5 L felt that the combination of continued diuretic use along with Jardiance led to dehydration and worsening renal function.  Presently she is taking Jardiance and torsemide every other day.  Symptoms of dyspnea has improved, leg edema has resolved.  She now presents for follow-up of claudication and heart failure.     Past Medical History:  Diagnosis Date   Asthma    Congestive heart failure (CHF) ([place]) 2019   COPD (chronic obstructive pulmonary disease) ([place]) 2021   Diabetes ([place]) 2010   Eczema    Heart attack ([place]) 2015   Heart disease 2008   History of left hip replacement 04/2018   Past Surgical History:  Procedure Laterality Date   APPENDECTOMY  1974   CARPAL TUNNEL RELEASE  2017   LUMBAR SPINE SURGERY  2010   REPLACEMENT TOTAL KNEE  2017 and 2018   TONSILLECTOMY     TOTAL ABDOMINAL HYSTERECTOMY  1989   TOTAL HIP ARTHROPLASTY  04/2018   TOTAL SHOULDER ARTHROPLASTY  11/2019    Family History  Problem Relation Age of Onset   Heart disease Mother    Breast cancer Mother    Heart attack Father    Lung cancer Father    Eczema Grandson    Allergic rhinitis Neg Hx    Angioedema Neg Hx    Asthma Neg Hx    Atopy Neg Hx    Immunodeficiency Neg Hx    Urticaria Neg Hx     Social History   Tobacco Use   Smoking status: Former    Packs/day: 0.50    Years: 15.00    Pack years: 7.50    Types: Cigarettes    Quit date: 10/14/1995    Years since quitting: 25.0   Smokeless tobacco: Never  Substance Use Topics   Alcohol use: Not Currently   Marital Status: Single  ROS  Review of Systems  Cardiovascular:  Positive for claudication and dyspnea on exertion. Negative for chest pain and leg swelling.  Musculoskeletal:  Positive for arthritis and back pain.  Gastrointestinal:  Negative for melena.  Objective  Blood pressure 132/75, pulse 90, temperature 98.7 F (37.1 C), temperature source Temporal, resp. rate 17, height 5' 5"  (1.651 m), weight 248 lb 6.4 oz (112.7 kg), SpO2 96 %.  Vitals with BMI 10/12/2020 09/24/2020 09/12/2020  Height 5' 5"  5' 5"  5' 5"   Weight 248 lbs 6 oz 242 lbs 6 oz 245 lbs 13 oz  BMI 41.34 40.34 40.9  Systolic 425 956 387  Diastolic 75 78 76  Pulse 90 81 87     Physical Exam Constitutional:      Comments: Morbidly obese in no acute distress.  Neck:     Vascular: No carotid bruit or JVD.  Cardiovascular:     Rate and Rhythm: Normal rate and regular rhythm.     Pulses:          Carotid pulses are 2+ on the right side and 2+ on the left side.      Radial pulses are 2+ on the right side and 2+ on the left side.       Femoral pulses are 1+ on the right side and 1+ on the left side.      Dorsalis pedis pulses are 1+ on the right side and 1+ on the left side.       Posterior tibial pulses are 0 on the right side and 0 on the left side.     Heart sounds: Normal heart sounds. No murmur heard.   No gallop.  Pulmonary:     Effort: Pulmonary  effort is normal.     Breath sounds: Normal breath sounds.  Abdominal:     General: Bowel sounds are normal.     Palpations: Abdomen is soft.     Comments: Obese. Pannus present  Musculoskeletal:     Right lower leg: No edema.     Left lower leg: No edema.   Laboratory examination:   Lipid Panel  No results found for: CHOL, TRIG, HDL, CHOLHDL, VLDL, LDLCALC, LDLDIRECT HEMOGLOBIN A1C No results found for: HGBA1C, MPG TSH No results for input(s): TSH in the last 8760 hours.   BNP No results found for: BNP  ProBNP No results found for: PROBNP  External labs:   Labs 10/10/2020:  Sodium 141, potassium 4.4, BUN 25, creatinine 1.33, EGFR 44 mL.   Labs 08/28/2020:   Sodium 141, potassium 4.0, BUN 13, creatinine 1.93, EGFR 28 mL.  A1c 7.41%.  Urine analysis reveals markedly elevated urinary protein/creatinine ratio.  Labs 03/15/2020:  Total cholesterol 112, triglycerides 79, HDL 34, LDL 52.  Serum glucose 98 mg, BUN 16, creatinine 1.10, EGFR 52/61 mL.  Sodium 142, potassium 3.9, CMP otherwise normal.  Hb 12.6/HCT 40.0, WBC 6.5, platelets 407, mildly elevated.  Indicis are normal.  A1c 6.4%.  TSH normal at 1.80.  K 3.7 11/29/2019  CL 106 11/29/2019  CO2 25 11/29/2019  BUN 13 11/29/2019  CREATININE 1.29 (H) 11/29/2019  CALCIUM 9.6 11/29/2019   Lab Results  Component Value Date  HCT 32.4 (L) 11/29/2019  MCV 78.8 (L) 11/29/2019  PLT 445 (H) 11/29/2019    Labs 10/20/2019:  TSH normal, free T4 mildly elevated at 1.80 (0.60-1.60).  A1c 7.1%.  BUN 14, creatinine 1.40, EGFR 45 mill, potassium 3.8, sodium 140.  Total cholesterol 89, triglycerides 136, HDL 27, LDL 35.  Medications and allergies   Allergies  Allergen Reactions   Contrast Media [Iodinated Diagnostic Agents] Shortness Of Breath and Other (See Comments)    sob, chest tight   Other Shortness Of Breath    Other reaction(s): Other (See Comments) sob, chest tight   Morphine Itching   Statins Diarrhea  and Other (See Comments)    nausea, diarrhea, myalgias     Outpatient Medications Prior to Visit  Medication Sig Dispense Refill   albuterol (VENTOLIN HFA) 108 (90 Base) MCG/ACT inhaler Inhale 2 puffs into the lungs every 6 (six) hours as needed.  azelastine (ASTELIN) 0.1 % nasal spray 1-2 sprays per nostril 2 times daily as needed for runny nose. 90 mL 2   Budeson-Glycopyrrol-Formoterol (BREZTRI AEROSPHERE) 160-9-4.8 MCG/ACT AERO Inhale 2 puffs into the lungs in the morning and at bedtime. 10.7 g 5   clopidogrel (PLAVIX) 75 MG tablet Take 75 mg by mouth.     Continuous Blood Gluc Sensor (DEXCOM G6 SENSOR) MISC USE 1 SENSOR AS NEEDED CHANGE EVERY 10 DAYS     cromolyn (OPTICROM) 4 % ophthalmic solution Place 1 drop into both eyes 4 (four) times daily as needed (itchy/watery eyes). 10 mL 3   empagliflozin (JARDIANCE) 10 MG TABS tablet Take 1 tablet by mouth daily.     EPINEPHrine (EPIPEN 2-PAK) 0.3 mg/0.3 mL IJ SOAJ injection Use as directed for severe allergic reactions 2 each 3   ferrous sulfate 325 (65 FE) MG tablet Take 325 mg by mouth in the morning, at noon, and at bedtime.     gabapentin (NEURONTIN) 300 MG capsule Take 300 mg by mouth in the morning, at noon, and at bedtime.     glucagon 1 MG injection Inject into the muscle as needed.     glucose 4 GM chewable tablet Chew by mouth as needed.     HUMULIN N 100 UNIT/ML injection Inject into the skin.     insulin regular human CONCENTRATED (HUMULIN R) 500 UNIT/ML kwikpen Inject 80 Units into the skin daily.     isosorbide mononitrate (IMDUR) 60 MG 24 hr tablet Take 60 mg by mouth daily.     ketotifen (ZADITOR) 0.025 % ophthalmic solution 1 drop 2 (two) times daily.     levocetirizine (XYZAL) 5 MG tablet Take 1 tablet (5 mg total) by mouth every evening. 90 tablet 2   lisinopril (ZESTRIL) 20 MG tablet Take 20 mg by mouth daily.     metoprolol tartrate (LOPRESSOR) 50 MG tablet Take 50 mg by mouth every 12 (twelve) hours.      nitroGLYCERIN (NITROSTAT) 0.4 MG SL tablet Place under the tongue as needed.     Olopatadine HCl 0.2 % SOLN Apply 1 drop to eye daily as needed (itchy/watery eyes). 2.5 mL 5   pantoprazole (PROTONIX) 40 MG tablet Take 40 mg by mouth in the morning and at bedtime.     psyllium (METAMUCIL) 58.6 % packet Take by mouth.     Respiratory Therapy Supplies (CARETOUCH 2 CPAP HOSE HANGER) MISC by Does not apply route.     rosuvastatin (CRESTOR) 40 MG tablet Take 40 mg by mouth at bedtime.     Semaglutide, 1 MG/DOSE, (OZEMPIC, 1 MG/DOSE,) 4 MG/3ML SOPN Inject into the skin once a week.     Tiotropium Bromide Monohydrate (SPIRIVA RESPIMAT) 1.25 MCG/ACT AERS Inhale 2 puffs into the lungs daily. 3 each 2   triamcinolone (NASACORT) 55 MCG/ACT AERO nasal inhaler as needed.     VALIUM 5 MG tablet Take 5 mg by mouth as needed.     vitamin B-12 (CYANOCOBALAMIN) 500 MCG tablet Take 500 mcg by mouth daily.     Vitamin D, Ergocalciferol, (DRISDOL) 1.25 MG (50000 UNIT) CAPS capsule Take 50,000 Units by mouth once a week.     clopidogrel (PLAVIX) 75 MG tablet Take 1 tablet by mouth daily.     torsemide (DEMADEX) 20 MG tablet Take 1 tablet (20 mg total) by mouth daily.     budesonide-formoterol (SYMBICORT) 160-4.5 MCG/ACT inhaler Inhale 2 puffs into the lungs in the morning and at bedtime. with spacer and  rinse mouth afterwards. 3 each 2   celecoxib (CELEBREX) 200 MG capsule      metFORMIN (GLUCOPHAGE) 500 MG tablet Take 500 mg by mouth in the morning and at bedtime. Currently on HOLD     tiZANidine (ZANAFLEX) 4 MG tablet Take 1 tablet by mouth daily.     No facility-administered medications prior to visit.    Radiology:   No results found.  Cardiac Studies:   Coronary angioplasty: -RCA PCI (Dr. Tonye Becket) 10/2011 for NSTEMI; LAD/Ramus DES 2008 Taxus;  Coronary angiogram 12/22/2017: A- Left Main: Mild plaquing, no critical lesions seen.  B- LAD: The LAD contains mild plaquing proximally and gives rise to a large  high lying 1st diagonal branch in which a 40% ostial stenosis is present. Mid LAD contains a well deployed stent which is patent, no evidence for significant restenoses. The distal LAD is very large and free of significant disease.  C- Left Circumflex: Limited views of the left circumflex were obtained due to dye issues. There appears to be a proximal left circumflex and 1st obtuse marginal stented segments which are patent and show no it evidence for significant restenoses.  D- RCA: The RCA is small and contains a patent proximal stent with 50 to 60% mid stenosis and mild plaquing noted distally.  Peripheral arteriogram 12/22/2017:  There are bilateral SFA stents present with evidence for severe right and mild left in stent restenoses present. The bilateral popliteal arteries and proximal trifurcation vessels are patent. However, there may be moderate to severe lesion involving the mid right anterior tibial artery in particular. In addition, there was poor flow noted in the left anterior tibial artery with 2 vessel runoff probably present bilaterally. Laser atherectomy of right SFA followed by DCB with 5.0 x 120 mm impact.  Echocardiogram 07/04/2019: Normal LV size, mild LVH, hyperdynamic LVEF at >65% with grade 1 diastolic dysfunction.  Otherwise no other significant abnormality.  Poor quality due to patient body habitus.  Lexiscan nuclear stress test 07/07/2019: Nondiagnostic EKG.  Dyspnea during stress test. No evidence of ischemia.  Soft tissue attenuation noted.  LVEF 72%.  Normal wall motion.  Low risk study.  No significant change from 12/20/2017.  Lower extremity venous duplex 07/02/2019: No evidence of DVT in bilateral lower extremity.  Lower Extremity Arterial Duplex 10/09/2020: Right mid and distal SFA occluded, dampened flow in the popliteal artery via collaterals. Moderate velocity increase at the right profunda femoral artery.  No hemodynamically significant stenoses are identified in  the left lower extremity arterial system. This exam reveals normal perfusion of the right lower extremity (ABI 0.97) with monophasic waveform in the PT This exam reveals mildly decreased perfusion of the left lower extremity, noted at the post tibial artery level (ABI 0.90) with monophasic waveform in the left AT.  Compared to 12/16/2019, right SFA stenosis is new.  No significant change in ABI.  Abdominal Aortic Duplex 10/09/2020: The maximum aorta (sac) diameter is 2.51 cm (prox) with mild ectasia in the proximal aorta. Diffuse plaque observed in the proximal, mid and distal aorta.  Bilateral CIA are not well visualized but appear to be severely stenosed with diffuse plaque, however, the flow velocity appears normal.  Clinical correlation recommended. Consider CTA or catheter directed angiography if clinically indicated.   EKG:   EKG 10/12/2020: Normal sinus rhythm at rate of 74 bpm, normal axis, poor R wave progression, probably normal variant.  No significant change from 03/23/2020.  Assessment     ICD-10-CM   1.  PAD (peripheral artery disease) (HCC)  I73.9     2. Chronic diastolic congestive heart failure (HCC)  I50.32 torsemide (DEMADEX) 20 MG tablet    3. Primary hypertension  I10 EKG 12-Lead    4. Type 2 diabetes mellitus with stage 3a chronic kidney disease, with long-term current use of insulin (HCC)  E11.22    N18.31    Z79.4       Meds ordered this encounter  Medications   torsemide (DEMADEX) 20 MG tablet    Sig: Take 1 tablet (20 mg total) by mouth daily as needed (Fluid build up).    Orders Placed This Encounter  Procedures   EKG 12-Lead   Recommendations:   Katrina Vega is a 65 y.o. Female patient with coronary artery disease and history of PCI to the LAD and ramus in 2008 with implantation of Taxus stents and NSTEMI SP RCA PCI in 3612, chronic diastolic heart failure, hyperlipidemia, hypertension, diabetes mellitus with stage III chronic kidney disease, morbid  obesity, OSA unable to tolerate CPAP, chronic renal insufficiency, peripheral arterial disease with right posterior tibial DES stent placed in 2008 and bilateral SFA stenting sometime in 2016 and atherectomy for ISR in 2019.  She presents for a 6-week office visit, states that she is now feeling better, dyspnea is improved, I reviewed her external labs, renal function is back to baseline with stage IIIa chronic kidney disease.  Upon review, patient taking Jardiance and also torsemide on a daily basis caused acute renal insufficiency.  I have advised her to go back to taking Jardiance once a day and to avoid taking torsemide and only take torsemide for worsening dyspnea or increased weight gain by 2 pounds for heart failure and at the day to hold Jardiance.  Blood pressure is well controlled.  With regard to peripheral arterial disease, pain in her hip when she lays down on the right side is probably musculoskeletal.  She does have mild cramping in the right calf, however her activity is limited by back pain and right-sided hip pain which appears to be musculoskeletal however, in view of obesity and poor visualization, I cannot exclude bilateral CIA stenosis. For now will continue medical therapy, in view of renal insufficiency, morbid obesity as a risk for procedural complications.  Weight loss was discussed with the patient.  Acute diastolic heart failure has resolved, she now has chronic diastolic heart failure which is stable, I will see her back in 6 months for follow-up.  CC: Biagio Quint, MD   Adrian Prows, MD, [facility] 10/12/2020, 9:49 AM Office: [phone]

## 2020-10-15 DIAGNOSIS — E119 Type 2 diabetes mellitus without complications: Secondary | ICD-10-CM | POA: Diagnosis not present

## 2020-10-18 ENCOUNTER — Ambulatory Visit (INDEPENDENT_AMBULATORY_CARE_PROVIDER_SITE_OTHER): Payer: Medicare Other | Admitting: *Deleted

## 2020-10-18 DIAGNOSIS — J309 Allergic rhinitis, unspecified: Secondary | ICD-10-CM

## 2020-10-24 ENCOUNTER — Other Ambulatory Visit: Payer: Medicare Other

## 2020-10-26 ENCOUNTER — Ambulatory Visit (INDEPENDENT_AMBULATORY_CARE_PROVIDER_SITE_OTHER): Payer: Medicare Other

## 2020-10-26 DIAGNOSIS — J309 Allergic rhinitis, unspecified: Secondary | ICD-10-CM | POA: Diagnosis not present

## 2020-11-01 ENCOUNTER — Ambulatory Visit (INDEPENDENT_AMBULATORY_CARE_PROVIDER_SITE_OTHER): Payer: Medicare Other

## 2020-11-01 ENCOUNTER — Telehealth: Payer: Self-pay

## 2020-11-01 DIAGNOSIS — J309 Allergic rhinitis, unspecified: Secondary | ICD-10-CM | POA: Diagnosis not present

## 2020-11-01 NOTE — Telephone Encounter (Signed)
Patient stopped by stating she was unable to get the [facility] inhaler from the pharmacy but she didn't know why. I called the patients pharmacy and they are currently out of the inhaler until Monday. Patient is almost out. I have placed a sample up front for the patient to pick up.   I called and left a voicemail for the patient to call back and go over this information.

## 2020-11-01 NOTE — Telephone Encounter (Signed)
Noted! Thank you

## 2020-11-05 DIAGNOSIS — M48062 Spinal stenosis, lumbar region with neurogenic claudication: Secondary | ICD-10-CM | POA: Diagnosis not present

## 2020-11-05 DIAGNOSIS — M47816 Spondylosis without myelopathy or radiculopathy, lumbar region: Secondary | ICD-10-CM | POA: Diagnosis not present

## 2020-11-06 ENCOUNTER — Ambulatory Visit: Payer: Medicare Other | Admitting: Family

## 2020-11-09 ENCOUNTER — Ambulatory Visit (INDEPENDENT_AMBULATORY_CARE_PROVIDER_SITE_OTHER): Payer: Medicare Other

## 2020-11-09 DIAGNOSIS — J309 Allergic rhinitis, unspecified: Secondary | ICD-10-CM | POA: Diagnosis not present

## 2020-11-13 DIAGNOSIS — J3081 Allergic rhinitis due to animal (cat) (dog) hair and dander: Secondary | ICD-10-CM | POA: Diagnosis not present

## 2020-11-13 NOTE — Progress Notes (Signed)
VIALS MADE. EXP 11-13-21

## 2020-11-14 ENCOUNTER — Telehealth: Payer: Self-pay

## 2020-11-14 DIAGNOSIS — J3089 Other allergic rhinitis: Secondary | ICD-10-CM | POA: Diagnosis not present

## 2020-11-14 NOTE — Telephone Encounter (Signed)
Patient called stating both arms were still red and swollen from getting her allergy injections on 11/09/20, I informed her to ice them and call back and let us know if they don't go down.  [phone] Katrina Vega

## 2020-11-15 DIAGNOSIS — E119 Type 2 diabetes mellitus without complications: Secondary | ICD-10-CM | POA: Diagnosis not present

## 2020-11-21 ENCOUNTER — Other Ambulatory Visit: Payer: Self-pay

## 2020-11-21 ENCOUNTER — Encounter: Payer: Self-pay | Admitting: Podiatry

## 2020-11-21 ENCOUNTER — Ambulatory Visit (INDEPENDENT_AMBULATORY_CARE_PROVIDER_SITE_OTHER): Payer: Medicare Other | Admitting: Podiatry

## 2020-11-21 ENCOUNTER — Ambulatory Visit (INDEPENDENT_AMBULATORY_CARE_PROVIDER_SITE_OTHER): Payer: Medicare Other | Admitting: *Deleted

## 2020-11-21 DIAGNOSIS — M216X1 Other acquired deformities of right foot: Secondary | ICD-10-CM

## 2020-11-21 DIAGNOSIS — I739 Peripheral vascular disease, unspecified: Secondary | ICD-10-CM

## 2020-11-21 DIAGNOSIS — M216X2 Other acquired deformities of left foot: Secondary | ICD-10-CM

## 2020-11-21 DIAGNOSIS — N183 Chronic kidney disease, stage 3 unspecified: Secondary | ICD-10-CM

## 2020-11-21 DIAGNOSIS — E1142 Type 2 diabetes mellitus with diabetic polyneuropathy: Secondary | ICD-10-CM | POA: Diagnosis not present

## 2020-11-21 DIAGNOSIS — L84 Corns and callosities: Secondary | ICD-10-CM

## 2020-11-21 DIAGNOSIS — J309 Allergic rhinitis, unspecified: Secondary | ICD-10-CM | POA: Diagnosis not present

## 2020-11-21 DIAGNOSIS — N179 Acute kidney failure, unspecified: Secondary | ICD-10-CM | POA: Diagnosis not present

## 2020-11-21 NOTE — Progress Notes (Signed)
This patient returns to my office for at risk foot care.  This patient requires this care by a professional since this patient will be at risk due to having PAD, DM,  and  CKD.  This patient is unable to cut callus  herself since the patient cannot reach her feet..These calluses are painful walking and wearing shoes.  This patient presents for at risk foot care today.  General Appearance  Alert, conversant and in no acute stress.  Vascular  Dorsalis pedis and posterior tibial  pulses are palpable  bilaterally.  Capillary return is within normal limits  bilaterally. Temperature is within normal limits  bilaterally.  Neurologic  Senn-Weinstein monofilament wire test within normal limits  bilaterally. Muscle power within normal limits bilaterally.  Nails Normal length nails from hallux to fifth toes bilaterally. No evidence of bacterial infection or drainage bilaterally.  Orthopedic  No limitations of motion  feet .  No crepitus or effusions noted.  No bony pathology or digital deformities noted.  Skin  normotropic skin with no porokeratosis noted bilaterally.  No signs of infections or ulcers noted.    Porokeratosis sub 3  B/L.  Consent was obtained for treatment procedures.  Debridement of callus sub 3  B/L. performed with a # 15 blade.  Filed with dremel without incident.    Return office visit   10 weeks                   Told patient to return for periodic foot care and evaluation due to potential at risk complications.   Gardiner Barefoot DPM

## 2020-11-26 DIAGNOSIS — E1142 Type 2 diabetes mellitus with diabetic polyneuropathy: Secondary | ICD-10-CM | POA: Diagnosis not present

## 2020-11-26 DIAGNOSIS — Z794 Long term (current) use of insulin: Secondary | ICD-10-CM | POA: Diagnosis not present

## 2020-11-26 DIAGNOSIS — E782 Mixed hyperlipidemia: Secondary | ICD-10-CM | POA: Diagnosis not present

## 2020-11-26 DIAGNOSIS — K219 Gastro-esophageal reflux disease without esophagitis: Secondary | ICD-10-CM | POA: Diagnosis not present

## 2020-11-26 DIAGNOSIS — Z23 Encounter for immunization: Secondary | ICD-10-CM | POA: Diagnosis not present

## 2020-11-26 DIAGNOSIS — J410 Simple chronic bronchitis: Secondary | ICD-10-CM | POA: Diagnosis not present

## 2020-11-26 DIAGNOSIS — E611 Iron deficiency: Secondary | ICD-10-CM | POA: Diagnosis not present

## 2020-11-26 DIAGNOSIS — I10 Essential (primary) hypertension: Secondary | ICD-10-CM | POA: Diagnosis not present

## 2020-11-26 DIAGNOSIS — I5032 Chronic diastolic (congestive) heart failure: Secondary | ICD-10-CM | POA: Diagnosis not present

## 2020-11-26 DIAGNOSIS — E1165 Type 2 diabetes mellitus with hyperglycemia: Secondary | ICD-10-CM | POA: Diagnosis not present

## 2020-12-03 ENCOUNTER — Ambulatory Visit (INDEPENDENT_AMBULATORY_CARE_PROVIDER_SITE_OTHER): Payer: Medicare Other

## 2020-12-03 DIAGNOSIS — J309 Allergic rhinitis, unspecified: Secondary | ICD-10-CM

## 2020-12-04 DIAGNOSIS — E1140 Type 2 diabetes mellitus with diabetic neuropathy, unspecified: Secondary | ICD-10-CM | POA: Diagnosis not present

## 2020-12-04 DIAGNOSIS — Z794 Long term (current) use of insulin: Secondary | ICD-10-CM | POA: Diagnosis not present

## 2020-12-04 DIAGNOSIS — E1122 Type 2 diabetes mellitus with diabetic chronic kidney disease: Secondary | ICD-10-CM | POA: Diagnosis not present

## 2020-12-04 DIAGNOSIS — Z7985 Long-term (current) use of injectable non-insulin antidiabetic drugs: Secondary | ICD-10-CM | POA: Diagnosis not present

## 2020-12-04 DIAGNOSIS — Z7984 Long term (current) use of oral hypoglycemic drugs: Secondary | ICD-10-CM | POA: Diagnosis not present

## 2020-12-04 DIAGNOSIS — N182 Chronic kidney disease, stage 2 (mild): Secondary | ICD-10-CM | POA: Diagnosis not present

## 2020-12-04 LAB — HEMOGLOBIN A1C: Hemoglobin A1C: 7.2

## 2020-12-05 DIAGNOSIS — Z794 Long term (current) use of insulin: Secondary | ICD-10-CM | POA: Diagnosis not present

## 2020-12-05 DIAGNOSIS — K219 Gastro-esophageal reflux disease without esophagitis: Secondary | ICD-10-CM | POA: Diagnosis not present

## 2020-12-05 DIAGNOSIS — I5020 Unspecified systolic (congestive) heart failure: Secondary | ICD-10-CM | POA: Diagnosis not present

## 2020-12-05 DIAGNOSIS — R0602 Shortness of breath: Secondary | ICD-10-CM | POA: Diagnosis not present

## 2020-12-05 DIAGNOSIS — E782 Mixed hyperlipidemia: Secondary | ICD-10-CM | POA: Diagnosis not present

## 2020-12-05 DIAGNOSIS — E1165 Type 2 diabetes mellitus with hyperglycemia: Secondary | ICD-10-CM | POA: Diagnosis not present

## 2020-12-05 DIAGNOSIS — Z86010 Personal history of colonic polyps: Secondary | ICD-10-CM | POA: Diagnosis not present

## 2020-12-05 DIAGNOSIS — J410 Simple chronic bronchitis: Secondary | ICD-10-CM | POA: Diagnosis not present

## 2020-12-05 DIAGNOSIS — E1142 Type 2 diabetes mellitus with diabetic polyneuropathy: Secondary | ICD-10-CM | POA: Diagnosis not present

## 2020-12-05 DIAGNOSIS — E611 Iron deficiency: Secondary | ICD-10-CM | POA: Diagnosis not present

## 2020-12-05 DIAGNOSIS — I10 Essential (primary) hypertension: Secondary | ICD-10-CM | POA: Diagnosis not present

## 2020-12-05 DIAGNOSIS — I5032 Chronic diastolic (congestive) heart failure: Secondary | ICD-10-CM | POA: Diagnosis not present

## 2020-12-05 DIAGNOSIS — R1031 Right lower quadrant pain: Secondary | ICD-10-CM | POA: Diagnosis not present

## 2020-12-11 ENCOUNTER — Encounter: Payer: Self-pay | Admitting: Cardiology

## 2020-12-13 DIAGNOSIS — G4733 Obstructive sleep apnea (adult) (pediatric): Secondary | ICD-10-CM | POA: Diagnosis not present

## 2020-12-14 ENCOUNTER — Other Ambulatory Visit: Payer: Self-pay

## 2020-12-14 ENCOUNTER — Telehealth: Payer: Self-pay | Admitting: Cardiology

## 2020-12-14 DIAGNOSIS — I5032 Chronic diastolic (congestive) heart failure: Secondary | ICD-10-CM

## 2020-12-14 MED ORDER — ISOSORBIDE MONONITRATE ER 60 MG PO TB24: 60.0000 mg | ORAL_TABLET | Freq: Every day | ORAL | 0 refills | Status: DC

## 2020-12-14 MED ORDER — TORSEMIDE 20 MG PO TABS: 20.0000 mg | ORAL_TABLET | Freq: Every day | ORAL | Status: DC | PRN

## 2020-12-14 MED ORDER — CLOPIDOGREL BISULFATE 75 MG PO TABS: 75.0000 mg | ORAL_TABLET | Freq: Every day | ORAL | 0 refills | Status: DC

## 2020-12-14 MED ORDER — METOPROLOL TARTRATE 50 MG PO TABS: 50.0000 mg | ORAL_TABLET | Freq: Two times a day (BID) | ORAL | 0 refills | Status: DC

## 2020-12-14 NOTE — Telephone Encounter (Signed)
From patient.

## 2020-12-14 NOTE — Telephone Encounter (Signed)
Refills sent

## 2020-12-14 NOTE — Telephone Encounter (Signed)
Patient was waiting on approval for 4 medications: metoprolol, torsemide, isosorbide,and plavix. Wanting to know the status of this.

## 2020-12-15 DIAGNOSIS — E119 Type 2 diabetes mellitus without complications: Secondary | ICD-10-CM | POA: Diagnosis not present

## 2020-12-16 ENCOUNTER — Encounter: Payer: Self-pay | Admitting: Cardiology

## 2020-12-19 DIAGNOSIS — M5441 Lumbago with sciatica, right side: Secondary | ICD-10-CM | POA: Diagnosis not present

## 2020-12-19 DIAGNOSIS — M545 Low back pain, unspecified: Secondary | ICD-10-CM | POA: Diagnosis not present

## 2020-12-19 DIAGNOSIS — M7061 Trochanteric bursitis, right hip: Secondary | ICD-10-CM | POA: Diagnosis not present

## 2020-12-19 DIAGNOSIS — M1611 Unilateral primary osteoarthritis, right hip: Secondary | ICD-10-CM | POA: Diagnosis not present

## 2020-12-21 ENCOUNTER — Ambulatory Visit (INDEPENDENT_AMBULATORY_CARE_PROVIDER_SITE_OTHER): Payer: Medicare Other

## 2020-12-21 DIAGNOSIS — J309 Allergic rhinitis, unspecified: Secondary | ICD-10-CM | POA: Diagnosis not present

## 2020-12-26 ENCOUNTER — Other Ambulatory Visit: Payer: Self-pay | Admitting: Cardiology

## 2020-12-26 ENCOUNTER — Other Ambulatory Visit: Payer: Self-pay | Admitting: Gastroenterology

## 2020-12-26 DIAGNOSIS — E611 Iron deficiency: Secondary | ICD-10-CM | POA: Diagnosis not present

## 2020-12-26 DIAGNOSIS — E1142 Type 2 diabetes mellitus with diabetic polyneuropathy: Secondary | ICD-10-CM | POA: Diagnosis not present

## 2020-12-26 DIAGNOSIS — E1165 Type 2 diabetes mellitus with hyperglycemia: Secondary | ICD-10-CM | POA: Diagnosis not present

## 2020-12-26 DIAGNOSIS — Z794 Long term (current) use of insulin: Secondary | ICD-10-CM | POA: Diagnosis not present

## 2020-12-26 DIAGNOSIS — I5032 Chronic diastolic (congestive) heart failure: Secondary | ICD-10-CM | POA: Diagnosis not present

## 2020-12-26 DIAGNOSIS — J410 Simple chronic bronchitis: Secondary | ICD-10-CM | POA: Diagnosis not present

## 2020-12-26 DIAGNOSIS — K219 Gastro-esophageal reflux disease without esophagitis: Secondary | ICD-10-CM | POA: Diagnosis not present

## 2020-12-26 DIAGNOSIS — E782 Mixed hyperlipidemia: Secondary | ICD-10-CM | POA: Diagnosis not present

## 2020-12-26 DIAGNOSIS — I10 Essential (primary) hypertension: Secondary | ICD-10-CM | POA: Diagnosis not present

## 2021-01-07 ENCOUNTER — Other Ambulatory Visit: Payer: Self-pay | Admitting: Cardiology

## 2021-01-07 DIAGNOSIS — I10 Essential (primary) hypertension: Secondary | ICD-10-CM | POA: Diagnosis not present

## 2021-01-07 DIAGNOSIS — E1142 Type 2 diabetes mellitus with diabetic polyneuropathy: Secondary | ICD-10-CM | POA: Diagnosis not present

## 2021-01-07 DIAGNOSIS — L989 Disorder of the skin and subcutaneous tissue, unspecified: Secondary | ICD-10-CM | POA: Diagnosis not present

## 2021-01-07 DIAGNOSIS — J410 Simple chronic bronchitis: Secondary | ICD-10-CM | POA: Diagnosis not present

## 2021-01-07 DIAGNOSIS — K219 Gastro-esophageal reflux disease without esophagitis: Secondary | ICD-10-CM | POA: Diagnosis not present

## 2021-01-07 DIAGNOSIS — E1165 Type 2 diabetes mellitus with hyperglycemia: Secondary | ICD-10-CM | POA: Diagnosis not present

## 2021-01-07 DIAGNOSIS — E782 Mixed hyperlipidemia: Secondary | ICD-10-CM | POA: Diagnosis not present

## 2021-01-07 DIAGNOSIS — I5032 Chronic diastolic (congestive) heart failure: Secondary | ICD-10-CM | POA: Diagnosis not present

## 2021-01-07 DIAGNOSIS — Z794 Long term (current) use of insulin: Secondary | ICD-10-CM | POA: Diagnosis not present

## 2021-01-07 DIAGNOSIS — E611 Iron deficiency: Secondary | ICD-10-CM | POA: Diagnosis not present

## 2021-01-14 DIAGNOSIS — E611 Iron deficiency: Secondary | ICD-10-CM | POA: Diagnosis not present

## 2021-01-14 DIAGNOSIS — J410 Simple chronic bronchitis: Secondary | ICD-10-CM | POA: Diagnosis not present

## 2021-01-14 DIAGNOSIS — K219 Gastro-esophageal reflux disease without esophagitis: Secondary | ICD-10-CM | POA: Diagnosis not present

## 2021-01-14 DIAGNOSIS — I10 Essential (primary) hypertension: Secondary | ICD-10-CM | POA: Diagnosis not present

## 2021-01-14 DIAGNOSIS — E1142 Type 2 diabetes mellitus with diabetic polyneuropathy: Secondary | ICD-10-CM | POA: Diagnosis not present

## 2021-01-14 DIAGNOSIS — I5032 Chronic diastolic (congestive) heart failure: Secondary | ICD-10-CM | POA: Diagnosis not present

## 2021-01-14 DIAGNOSIS — E782 Mixed hyperlipidemia: Secondary | ICD-10-CM | POA: Diagnosis not present

## 2021-01-14 DIAGNOSIS — E1165 Type 2 diabetes mellitus with hyperglycemia: Secondary | ICD-10-CM | POA: Diagnosis not present

## 2021-01-14 DIAGNOSIS — Z794 Long term (current) use of insulin: Secondary | ICD-10-CM | POA: Diagnosis not present

## 2021-01-15 DIAGNOSIS — E119 Type 2 diabetes mellitus without complications: Secondary | ICD-10-CM | POA: Diagnosis not present

## 2021-01-24 ENCOUNTER — Other Ambulatory Visit: Payer: Self-pay | Admitting: *Deleted

## 2021-01-24 MED ORDER — CROMOLYN SODIUM 4 % OP SOLN: 1.0000 [drp] | Freq: Four times a day (QID) | OPHTHALMIC | 0 refills | Status: DC | PRN

## 2021-01-28 ENCOUNTER — Ambulatory Visit: Payer: Medicare Other | Admitting: Allergy

## 2021-01-28 ENCOUNTER — Ambulatory Visit (INDEPENDENT_AMBULATORY_CARE_PROVIDER_SITE_OTHER): Payer: Medicare Other

## 2021-01-28 DIAGNOSIS — J309 Allergic rhinitis, unspecified: Secondary | ICD-10-CM | POA: Diagnosis not present

## 2021-01-28 NOTE — Progress Notes (Deleted)
Follow Up Note  RE: Katrina Vega MRN: [id number] DOB: 13-Apr-1955 Date of Office Visit: 01/28/2021  Referring provider: Trey Sailors, PA Primary care provider: Trey Sailors, PA  Chief Complaint: No chief complaint on file.  History of Present Illness: I had the pleasure of seeing Katrina Vega for a follow up visit at the [facility] on 01/28/2021. She is a 65 y.o. female, who is being followed for asthma/COPD, allergic rhinoconjunctivitis on AIT and heartburn. Her previous allergy office visit was on 09/24/2020 with Dr. Maudie Mercury. Today is a regular follow up visit.  Had Covid-19 in June. Using albuterol when exposed to smoke or strong scents with good benefit.  Today's spirometry showed some restriction.  ACT score 21.  Daily controller medication(s):  START Breztri 2 puffs twice a day with spacer and rinse mouth afterwards. Sample given and coupon given. This should simplify her inhaler regimen.  If not covered then go back to using: Symbicort 152mg 2 puffs twice a day with spacer and rinse mouth afterwards AND  Spiriva 1.235m 2 puffs once a day.  May use albuterol rescue inhaler 2 puffs every 4 to 6 hours as needed for shortness of breath, chest tightness, coughing, and wheezing. May use albuterol rescue inhaler 2 puffs 5 to 15 minutes prior to strenuous physical activities. Monitor frequency of use.  Get spirometry at next visit.   Seasonal and perennial allergic rhinoconjunctivitis Past history - [facility] [place] [facility] Interim history - increased PND, had cataract surgery. Continue environmental control measures directed towards dust mite, cockroach, weeds, cat, and dog. Continue allergy injections - given today. Use over the counter antihistamines such as Xyzal (levocetirizine) daily as needed. May take twice a day during allergy flares. May switch antihistamines every few months. Use azelastine nasal spray 1-2  sprays per nostril twice a day as needed for runny nose/drainage. Use Nasacort (triamcinolone) nasal spray 1 spray per nostril twice a day as needed for nasal congestion.  Nasal saline spray (i.e., Simply Saline) or nasal saline lavage (i.e., NeilMed) is recommended as needed and prior to medicated nasal sprays. Use cromolyn 4% 1 drop in each eye up to four times a day as needed for itchy/watery eyes.  May use refresh eye drops as needed.   Heartburn Continue lifestyle and dietary modifications. Continue Protonix 4079mwice a day as prescribed.    Return in about 4 months (around 01/24/2021).    Assessment and Plan: Katrina Vega a 65 3o. female with: No problem-specific Assessment & Plan notes found for this encounter.  No follow-ups on file.  No orders of the defined types were placed in this encounter.  Lab Orders  No laboratory test(s) ordered today    Diagnostics: Spirometry:  Tracings reviewed. Her effort: {Blank single:19197::"Good reproducible efforts.","It was hard to get consistent efforts and there is a question as to whether this reflects a maximal maneuver.","Poor effort, data can not be interpreted."} FVC: ***L FEV1: ***L, ***% predicted FEV1/FVC ratio: ***% Interpretation: {Blank single:19197::"Spirometry consistent with mild obstructive disease","Spirometry consistent with moderate obstructive disease","Spirometry consistent with severe obstructive disease","Spirometry consistent with possible restrictive disease","Spirometry consistent with mixed obstructive and restrictive disease","Spirometry uninterpretable due to technique","Spirometry consistent with normal pattern","No overt abnormalities noted given today's efforts"}.  Please see scanned spirometry results for details.  Skin Testing: {Blank single:19197::"Select foods","Environmental allergy panel","Environmental allergy panel and select foods","Food allergy panel","None","Deferred due to recent antihistamines  use"}. *** Results discussed with patient/family.  Medication List:  Current Outpatient Medications  Medication Sig Dispense Refill   albuterol (VENTOLIN HFA) 108 (90 Base) MCG/ACT inhaler Inhale 2 puffs into the lungs every 6 (six) hours as needed.     azelastine (ASTELIN) 0.1 % nasal spray 1-2 sprays per nostril 2 times daily as needed for runny nose. 90 mL 2   BD AUTOSHIELD DUO 30G X 5 MM MISC 1 KIT BY MISC ROUTE 3 TIMES DAILY BEFORE MEALS     Budeson-Glycopyrrol-Formoterol (BREZTRI AEROSPHERE) 160-9-4.8 MCG/ACT AERO Inhale 2 puffs into the lungs in the morning and at bedtime. 10.7 g 5   clopidogrel (PLAVIX) 75 MG tablet TAKE 1 TABLET BY MOUTH  DAILY 90 tablet 0   Continuous Blood Gluc Sensor (DEXCOM G6 SENSOR) MISC USE 1 SENSOR AS NEEDED CHANGE EVERY 10 DAYS     cromolyn (OPTICROM) 4 % ophthalmic solution Place 1 drop into both eyes 4 (four) times daily as needed (itchy/watery eyes). 30 mL 0   empagliflozin (JARDIANCE) 10 MG TABS tablet Take 1 tablet by mouth daily.     EPINEPHrine (EPIPEN 2-PAK) 0.3 mg/0.3 mL IJ SOAJ injection Use as directed for severe allergic reactions 2 each 3   ferrous sulfate 325 (65 FE) MG tablet Take 325 mg by mouth in the morning, at noon, and at bedtime.     gabapentin (NEURONTIN) 300 MG capsule Take 300 mg by mouth in the morning, at noon, and at bedtime.     glucagon 1 MG injection Inject into the muscle as needed.     glucose 4 GM chewable tablet Chew by mouth as needed.     HUMULIN N 100 UNIT/ML injection Inject into the skin.     insulin regular human CONCENTRATED (HUMULIN R) 500 UNIT/ML kwikpen Inject 80 Units into the skin daily.     isosorbide mononitrate (IMDUR) 60 MG 24 hr tablet Take 1 tablet (60 mg total) by mouth daily. 90 tablet 0   ketotifen (ZADITOR) 0.025 % ophthalmic solution 1 drop 2 (two) times daily.     levocetirizine (XYZAL) 5 MG tablet Take 1 tablet (5 mg total) by mouth every evening. 90 tablet 2   lisinopril (ZESTRIL) 20 MG tablet  Take 20 mg by mouth daily.     metoprolol tartrate (LOPRESSOR) 50 MG tablet TAKE 1 TABLET BY MOUTH  EVERY 12 HOURS 90 tablet 7   nitroGLYCERIN (NITROSTAT) 0.4 MG SL tablet Place under the tongue as needed.     Olopatadine HCl 0.2 % SOLN Apply 1 drop to eye daily as needed (itchy/watery eyes). 2.5 mL 5   pantoprazole (PROTONIX) 40 MG tablet Take 40 mg by mouth in the morning and at bedtime.     psyllium (METAMUCIL) 58.6 % packet Take by mouth.     Respiratory Therapy Supplies (CARETOUCH 2 CPAP HOSE HANGER) MISC by Does not apply route.     rosuvastatin (CRESTOR) 40 MG tablet Take 40 mg by mouth at bedtime.     Semaglutide, 1 MG/DOSE, (OZEMPIC, 1 MG/DOSE,) 4 MG/3ML SOPN Inject into the skin once a week.     Tiotropium Bromide Monohydrate (SPIRIVA RESPIMAT) 1.25 MCG/ACT AERS Inhale 2 puffs into the lungs daily. 3 each 2   torsemide (DEMADEX) 20 MG tablet Take 1 tablet (20 mg total) by mouth daily as needed (Fluid build up).     triamcinolone (NASACORT) 55 MCG/ACT AERO nasal inhaler as needed.     VALIUM 5 MG tablet Take 5 mg by mouth as needed.     vitamin  B-12 (CYANOCOBALAMIN) 500 MCG tablet Take 500 mcg by mouth daily.     Vitamin D, Ergocalciferol, (DRISDOL) 1.25 MG (50000 UNIT) CAPS capsule Take 50,000 Units by mouth once a week.     No current facility-administered medications for this visit.   Allergies: Allergies  Allergen Reactions   Contrast Media [Iodinated Diagnostic Agents] Shortness Of Breath and Other (See Comments)    sob, chest tight   Other Shortness Of Breath    Other reaction(s): Other (See Comments) sob, chest tight   Morphine Itching   Statins Diarrhea and Other (See Comments)    nausea, diarrhea, myalgias   I reviewed her past medical history, social history, family history, and environmental history and no significant changes have been reported from her previous visit.  Review of Systems  Constitutional:  Negative for appetite change, chills, fever and unexpected  weight change.  HENT:  Positive for postnasal drip. Negative for congestion and rhinorrhea.   Eyes:  Negative for itching.  Respiratory:  Negative for cough, chest tightness, shortness of breath and wheezing.   Gastrointestinal:  Negative for abdominal pain.  Skin:  Negative for rash.  Allergic/Immunologic: Positive for environmental allergies.  Neurological:  Negative for headaches.   Objective: There were no vitals taken for this visit. There is no height or weight on file to calculate BMI. Physical Exam Vitals and nursing note reviewed.  Constitutional:      Appearance: Normal appearance. She is well-developed.  HENT:     Head: Normocephalic and atraumatic.     Right Ear: External ear normal.     Left Ear: External ear normal.     Nose: Nose normal.     Mouth/Throat:     Mouth: Mucous membranes are moist.     Pharynx: Oropharynx is clear.  Eyes:     Conjunctiva/sclera: Conjunctivae normal.  Cardiovascular:     Rate and Rhythm: Normal rate and regular rhythm.     Heart sounds: Normal heart sounds. No murmur heard. Pulmonary:     Effort: Pulmonary effort is normal.     Breath sounds: Normal breath sounds. No wheezing, rhonchi or rales.  Musculoskeletal:     Cervical back: Neck supple.  Skin:    General: Skin is warm.     Findings: No rash.  Neurological:     Mental Status: She is alert and oriented to person, place, and time.  Psychiatric:        Behavior: Behavior normal.   Previous notes and tests were reviewed. The plan was reviewed with the patient/family, and all questions/concerned were addressed.  It was my pleasure to see Katrina Vega today and participate in her care. Please feel free to contact me with any questions or concerns.  Sincerely,  Rexene Alberts, DO Allergy & Immunology  [facility] office: [place] office: [phone]

## 2021-01-29 ENCOUNTER — Other Ambulatory Visit: Payer: Self-pay

## 2021-01-29 DIAGNOSIS — I5032 Chronic diastolic (congestive) heart failure: Secondary | ICD-10-CM

## 2021-01-29 MED ORDER — TORSEMIDE 20 MG PO TABS: 20.0000 mg | ORAL_TABLET | Freq: Every day | ORAL | 0 refills | Status: DC | PRN

## 2021-01-30 DIAGNOSIS — M7061 Trochanteric bursitis, right hip: Secondary | ICD-10-CM | POA: Diagnosis not present

## 2021-01-30 DIAGNOSIS — M25561 Pain in right knee: Secondary | ICD-10-CM | POA: Diagnosis not present

## 2021-01-31 ENCOUNTER — Ambulatory Visit (INDEPENDENT_AMBULATORY_CARE_PROVIDER_SITE_OTHER): Payer: Medicare Other | Admitting: Family

## 2021-01-31 VITALS — BP 110/70 | HR 86 | Temp 98.5°F | Resp 18 | Ht 65.0 in | Wt 246.2 lb

## 2021-01-31 DIAGNOSIS — M9901 Segmental and somatic dysfunction of cervical region: Secondary | ICD-10-CM | POA: Diagnosis not present

## 2021-01-31 DIAGNOSIS — J3089 Other allergic rhinitis: Secondary | ICD-10-CM | POA: Diagnosis not present

## 2021-01-31 DIAGNOSIS — M1612 Unilateral primary osteoarthritis, left hip: Secondary | ICD-10-CM | POA: Diagnosis not present

## 2021-01-31 DIAGNOSIS — I739 Peripheral vascular disease, unspecified: Secondary | ICD-10-CM

## 2021-01-31 DIAGNOSIS — M1611 Unilateral primary osteoarthritis, right hip: Secondary | ICD-10-CM | POA: Diagnosis not present

## 2021-01-31 DIAGNOSIS — I10 Essential (primary) hypertension: Secondary | ICD-10-CM

## 2021-01-31 DIAGNOSIS — E538 Deficiency of other specified B group vitamins: Secondary | ICD-10-CM

## 2021-01-31 DIAGNOSIS — N183 Chronic kidney disease, stage 3 unspecified: Secondary | ICD-10-CM

## 2021-01-31 DIAGNOSIS — M546 Pain in thoracic spine: Secondary | ICD-10-CM | POA: Diagnosis not present

## 2021-01-31 DIAGNOSIS — M62830 Muscle spasm of back: Secondary | ICD-10-CM | POA: Diagnosis not present

## 2021-01-31 DIAGNOSIS — Z794 Long term (current) use of insulin: Secondary | ICD-10-CM

## 2021-01-31 DIAGNOSIS — J449 Chronic obstructive pulmonary disease, unspecified: Secondary | ICD-10-CM | POA: Diagnosis not present

## 2021-01-31 DIAGNOSIS — N1832 Chronic kidney disease, stage 3b: Secondary | ICD-10-CM

## 2021-01-31 DIAGNOSIS — M791 Myalgia, unspecified site: Secondary | ICD-10-CM

## 2021-01-31 DIAGNOSIS — E1122 Type 2 diabetes mellitus with diabetic chronic kidney disease: Secondary | ICD-10-CM | POA: Diagnosis not present

## 2021-01-31 DIAGNOSIS — G629 Polyneuropathy, unspecified: Secondary | ICD-10-CM

## 2021-01-31 DIAGNOSIS — E785 Hyperlipidemia, unspecified: Secondary | ICD-10-CM | POA: Diagnosis not present

## 2021-01-31 DIAGNOSIS — M62838 Other muscle spasm: Secondary | ICD-10-CM | POA: Diagnosis not present

## 2021-01-31 DIAGNOSIS — I5032 Chronic diastolic (congestive) heart failure: Secondary | ICD-10-CM

## 2021-01-31 DIAGNOSIS — M25651 Stiffness of right hip, not elsewhere classified: Secondary | ICD-10-CM | POA: Diagnosis not present

## 2021-01-31 DIAGNOSIS — M9902 Segmental and somatic dysfunction of thoracic region: Secondary | ICD-10-CM | POA: Diagnosis not present

## 2021-01-31 DIAGNOSIS — D509 Iron deficiency anemia, unspecified: Secondary | ICD-10-CM

## 2021-01-31 DIAGNOSIS — M545 Low back pain, unspecified: Secondary | ICD-10-CM | POA: Diagnosis not present

## 2021-01-31 DIAGNOSIS — M5416 Radiculopathy, lumbar region: Secondary | ICD-10-CM | POA: Diagnosis not present

## 2021-01-31 DIAGNOSIS — Z1159 Encounter for screening for other viral diseases: Secondary | ICD-10-CM

## 2021-01-31 DIAGNOSIS — M6281 Muscle weakness (generalized): Secondary | ICD-10-CM | POA: Diagnosis not present

## 2021-01-31 DIAGNOSIS — M9903 Segmental and somatic dysfunction of lumbar region: Secondary | ICD-10-CM | POA: Diagnosis not present

## 2021-01-31 DIAGNOSIS — M542 Cervicalgia: Secondary | ICD-10-CM | POA: Diagnosis not present

## 2021-01-31 LAB — CBC WITH DIFFERENTIAL/PLATELET
Basophils Absolute: 0 10*3/uL (ref 0.0–0.1)
Basophils Relative: 0.9 % (ref 0.0–3.0)
Eosinophils Absolute: 0.2 10*3/uL (ref 0.0–0.7)
Eosinophils Relative: 3.1 % (ref 0.0–5.0)
HCT: 40.1 % (ref 36.0–46.0)
Hemoglobin: 12.7 g/dL (ref 12.0–15.0)
Lymphocytes Relative: 38.2 % (ref 12.0–46.0)
Lymphs Abs: 2 10*3/uL (ref 0.7–4.0)
MCHC: 31.7 g/dL (ref 30.0–36.0)
MCV: 82.8 fl (ref 78.0–100.0)
Monocytes Absolute: 0.4 10*3/uL (ref 0.1–1.0)
Monocytes Relative: 7.8 % (ref 3.0–12.0)
Neutro Abs: 2.6 10*3/uL (ref 1.4–7.7)
Neutrophils Relative %: 50 % (ref 43.0–77.0)
Platelets: 306 10*3/uL (ref 150.0–400.0)
RBC: 4.84 Mil/uL (ref 3.87–5.11)
RDW: 17.6 % — ABNORMAL HIGH (ref 11.5–15.5)
WBC: 5.2 10*3/uL (ref 4.0–10.5)

## 2021-01-31 LAB — VITAMIN B12: Vitamin B-12: 556 pg/mL (ref 211–911)

## 2021-01-31 LAB — LIPID PANEL
Cholesterol: 110 mg/dL (ref 0–200)
HDL: 36.7 mg/dL — ABNORMAL LOW (ref >39.00)
LDL Cholesterol: 60 mg/dL (ref 0–99)
NonHDL: 72.87
Total CHOL/HDL Ratio: 3
Triglycerides: 66 mg/dL (ref 0.0–149.0)
VLDL: 13.2 mg/dL (ref 0.0–40.0)

## 2021-01-31 LAB — CK: Total CK: 111 U/L (ref 7–177)

## 2021-01-31 LAB — SEDIMENTATION RATE: Sed Rate: 30 mm/hr (ref 0–30)

## 2021-01-31 MED ORDER — GABAPENTIN 300 MG PO CAPS: 300.0000 mg | ORAL_CAPSULE | Freq: Three times a day (TID) | ORAL | 1 refills | Status: DC

## 2021-01-31 MED ORDER — LISINOPRIL 20 MG PO TABS: 20.0000 mg | ORAL_TABLET | Freq: Every day | ORAL | 3 refills | Status: DC

## 2021-01-31 MED ORDER — ROSUVASTATIN CALCIUM 40 MG PO TABS: 40.0000 mg | ORAL_TABLET | Freq: Every day | ORAL | 3 refills | Status: DC

## 2021-01-31 MED ORDER — PANTOPRAZOLE SODIUM 40 MG PO TBEC: 40.0000 mg | DELAYED_RELEASE_TABLET | Freq: Two times a day (BID) | ORAL | 0 refills | Status: DC

## 2021-01-31 MED ORDER — PANTOPRAZOLE SODIUM 40 MG PO TBEC: 40.0000 mg | DELAYED_RELEASE_TABLET | Freq: Two times a day (BID) | ORAL | 3 refills | Status: DC

## 2021-01-31 NOTE — Progress Notes (Signed)
Katrina Vega is a 65 y.o. female with the following history as recorded in EpicCare:  Patient Active Problem List   Diagnosis Date Noted   Plantar flexed metatarsal bone of left foot 11/21/2020   Plantar flexed metatarsal bone of right foot 11/21/2020   AKI (acute kidney injury) ([place]) 10/10/2020   History of COVID-19 10/10/2020   Secondary hyperparathyroidism of renal origin ([place]) 10/10/2020   Vitamin D deficiency 10/10/2020   Heartburn 09/24/2020   Microscopic hematuria 08/02/2020   Proteinuria 08/02/2020   H/O deep venous thrombosis 03/23/2020   Other allergic rhinitis 02/29/2020   Seasonal and perennial allergic rhinoconjunctivitis 02/29/2020   Asthma-COPD overlap syndrome ([place]) 02/29/2020   Status post total shoulder arthroplasty, right 12/23/2019   Adrenal incidentaloma ([place]) 10/26/2019   DM cataract ([place]) 10/26/2019   Osteoarthritis of right glenohumeral joint 09/27/2019   CKD stage 3 due to type 2 diabetes mellitus ([place]) 09/22/2019   Lung nodule 07/01/2019   Hoarseness of voice 03/28/2019   Anemia due to vitamin B12 deficiency 12/29/2018   Nausea 07/01/2018   Atherosclerotic heart disease of native coronary artery without angina pectoris 05/11/2018   Hyperlipidemia 05/11/2018   Pneumonia 05/11/2018   Asthma 05/11/2018   S/P hip replacement, left 05/06/2018   Degenerative joint disease of left hip 05/05/2018   Carpal tunnel syndrome of right wrist 10/11/2017   Diarrhea due to malabsorption 09/02/2017   Chronic fatigue 01/07/2017   Osteoarthritis of multiple joints 01/07/2017   Chronic diastolic congestive heart failure ([place]) 01/01/2017   Impingement syndrome of left shoulder 12/29/2016   Precordial pain 10/08/2016   Morbid obesity ([place]) 10/08/2016   Lumbar radiculopathy 08/06/2016   Acute renal failure superimposed on stage 3 chronic kidney disease ([place]) 05/23/2016   Delayed gastric emptying 03/20/2016   Adrenal adenoma, left 10/14/2015   Gastroesophageal reflux  disease 10/12/2015   Mild intermittent asthma 08/21/2015   Shortness of breath 07/06/2015   Obstructive sleep apnea (adult) (pediatric) 03/12/2015   PAD (peripheral artery disease) ([place]) 02/01/2015   Cough 10/23/2014   Diabetes mellitus with neurological manifestations ([place]) 09/09/2012   Type 2 diabetes mellitus, with long-term current use of insulin ([place]) 09/10/2011   Anxiety state 03/31/2011   Essential hypertension 01/20/2011   Disorder of intervertebral disc 06/26/2009    Current Outpatient Medications  Medication Sig Dispense Refill   albuterol (VENTOLIN HFA) 108 (90 Base) MCG/ACT inhaler Inhale 2 puffs into the lungs every 6 (six) hours as needed.     azelastine (ASTELIN) 0.1 % nasal spray 1-2 sprays per nostril 2 times daily as needed for runny nose. 90 mL 2   BD AUTOSHIELD DUO 30G X 5 MM MISC 1 KIT BY MISC ROUTE 3 TIMES DAILY BEFORE MEALS     Budeson-Glycopyrrol-Formoterol (BREZTRI AEROSPHERE) 160-9-4.8 MCG/ACT AERO Inhale 2 puffs into the lungs in the morning and at bedtime. 10.7 g 5   clopidogrel (PLAVIX) 75 MG tablet TAKE 1 TABLET BY MOUTH  DAILY 90 tablet 0   Continuous Blood Gluc Sensor (DEXCOM G6 SENSOR) MISC USE 1 SENSOR AS NEEDED CHANGE EVERY 10 DAYS     cromolyn (OPTICROM) 4 % ophthalmic solution Place 1 drop into both eyes 4 (four) times daily as needed (itchy/watery eyes). 30 mL 0   empagliflozin (JARDIANCE) 10 MG TABS tablet Take 1 tablet by mouth daily.     EPINEPHrine (EPIPEN 2-PAK) 0.3 mg/0.3 mL IJ SOAJ injection Use as directed for severe allergic reactions 2 each 3   ferrous sulfate 325 (65 FE) MG  tablet Take 325 mg by mouth in the morning, at noon, and at bedtime.     glucagon 1 MG injection Inject into the muscle as needed.     glucose 4 GM chewable tablet Chew by mouth as needed.     HUMULIN N 100 UNIT/ML injection Inject into the skin.     isosorbide mononitrate (IMDUR) 60 MG 24 hr tablet Take 1 tablet (60 mg total) by mouth daily. 90 tablet 0   ketotifen  (ZADITOR) 0.025 % ophthalmic solution 1 drop 2 (two) times daily.     levocetirizine (XYZAL) 5 MG tablet Take 1 tablet (5 mg total) by mouth every evening. 90 tablet 2   metoprolol tartrate (LOPRESSOR) 50 MG tablet TAKE 1 TABLET BY MOUTH  EVERY 12 HOURS 90 tablet 7   nitroGLYCERIN (NITROSTAT) 0.4 MG SL tablet Place under the tongue as needed.     Olopatadine HCl 0.2 % SOLN Apply 1 drop to eye daily as needed (itchy/watery eyes). 2.5 mL 5   psyllium (METAMUCIL) 58.6 % packet Take by mouth.     Respiratory Therapy Supplies (CARETOUCH 2 CPAP HOSE HANGER) MISC by Does not apply route.     Semaglutide, 1 MG/DOSE, (OZEMPIC, 1 MG/DOSE,) 4 MG/3ML SOPN Inject into the skin once a week.     Tiotropium Bromide Monohydrate (SPIRIVA RESPIMAT) 1.25 MCG/ACT AERS Inhale 2 puffs into the lungs daily. 3 each 2   torsemide (DEMADEX) 20 MG tablet Take 1 tablet (20 mg total) by mouth daily as needed (Fluid build up). 90 tablet 0   triamcinolone (NASACORT) 55 MCG/ACT AERO nasal inhaler as needed.     VALIUM 5 MG tablet Take 5 mg by mouth as needed.     vitamin B-12 (CYANOCOBALAMIN) 500 MCG tablet Take 500 mcg by mouth daily.     Vitamin D, Ergocalciferol, (DRISDOL) 1.25 MG (50000 UNIT) CAPS capsule Take 50,000 Units by mouth once a week.     gabapentin (NEURONTIN) 300 MG capsule Take 1 capsule (300 mg total) by mouth 3 (three) times daily. 270 capsule 1   insulin regular human CONCENTRATED (HUMULIN R) 500 UNIT/ML kwikpen Inject 80 Units into the skin daily.     lisinopril (ZESTRIL) 20 MG tablet Take 1 tablet (20 mg total) by mouth daily. 90 tablet 3   pantoprazole (PROTONIX) 40 MG tablet Take 1 tablet (40 mg total) by mouth in the morning and at bedtime. 60 tablet 0   rosuvastatin (CRESTOR) 40 MG tablet Take 1 tablet (40 mg total) by mouth at bedtime. 30 tablet 3   No current facility-administered medications for this visit.    Allergies: Contrast media [iodinated diagnostic agents], Other, Morphine, and Statins   Past Medical History:  Diagnosis Date   Asthma    Congestive heart failure (CHF) ([place]) 2019   COPD (chronic obstructive pulmonary disease) ([place]) 2021   Diabetes ([place]) 2010   Eczema    Heart attack ([place]) 2015   Heart disease 2008   History of left hip replacement 04/2018    Past Surgical History:  Procedure Laterality Date   APPENDECTOMY  1974   CARPAL TUNNEL RELEASE  2017   LUMBAR SPINE SURGERY  2010   REPLACEMENT TOTAL KNEE  2017 and 2018   TONSILLECTOMY     TOTAL ABDOMINAL HYSTERECTOMY  1989   TOTAL HIP ARTHROPLASTY  04/2018   TOTAL SHOULDER ARTHROPLASTY  11/2019    Family History  Problem Relation Age of Onset   Heart disease Mother    Breast  cancer Mother    Heart attack Father    Lung cancer Father    Eczema Grandson    Allergic rhinitis Neg Hx    Angioedema Neg Hx    Asthma Neg Hx    Atopy Neg Hx    Immunodeficiency Neg Hx    Urticaria Neg Hx     Social History   Tobacco Use   Smoking status: Former    Packs/day: 0.50    Years: 15.00    Pack years: 7.50    Types: Cigarettes    Quit date: 10/14/1995    Years since quitting: 25.3   Smokeless tobacco: Never  Substance Use Topics   Alcohol use: Not Currently    Subjective:  Patient presents today as a new patient;  Is already under care of nephrology, cardiology, allergist and endocrinology; Dr. Mable Fill- [facility];   Patient is concerned that she has RA- notes she has been struggling with arthritis changes for "years." Would like to see rheumatology; saw orthopedist previously with concerns about chronic right hip/ groin pain;  Needs updated refills on her Protonix, Crestor, Gabapentin and Zestril;  Scheduled for colonoscopy at the end of this month;      Objective:  Vitals:   01/31/21 1017  BP: 110/70  Pulse: 86  Resp: 18  Temp: 98.5 F (36.9 C)  TempSrc: Oral  SpO2: 96%  Weight: 246 lb 3.2 oz (111.7 kg)  Height: 5' 5"  (1.651 m)    General: Well developed, well nourished, in no acute  distress  Skin : Warm and dry.  Head: Normocephalic and atraumatic  Lungs: Respirations unlabored; clear to auscultation bilaterally without wheeze, rales, rhonchi  CVS exam: normal rate and regular rhythm.  Musculoskeletal: No deformities; no active joint inflammation  Extremities: No edema, cyanosis, clubbing  Vessels: Symmetric bilaterally  Neurologic: Alert and oriented; speech intact; face symmetrical; moves all extremities well; CNII-XII intact without focal deficit   Assessment:  1. Myalgia   2. Iron deficiency anemia, unspecified iron deficiency anemia type   3. Low serum vitamin B12   4. Need for hepatitis C screening test   5. Hyperlipidemia, unspecified hyperlipidemia type   6. Asthma-COPD overlap syndrome ([place])   7. Osteoarthritis of left hip, unspecified osteoarthritis type   8. Type 2 diabetes mellitus with stage 3b chronic kidney disease, with long-term current use of insulin ([place])   9. CKD stage 3 due to type 2 diabetes mellitus ([place])   10. Other allergic rhinitis   11. PAD (peripheral artery disease) ([place])   12. Chronic diastolic congestive heart failure ([place])   13. Essential hypertension   14. Neuropathy     Plan:  Patient has not noticed any improvement in symptoms off her Crestor; will update labs and plan to refer to rheumatology per patient request; Has done iron infusions in the past with good response; currently taking OTC Fe tid and would be open to discussing other treatment options; Check B12; Check Hep C; Check lipid panel; refill updated on Crestor; Continue with Dr. Maudie Mercury, her asthma specialist; Continue with orthopedist; Last Hgba1c excellent at 7.2; continue with endocrinology; Stable; continue with nephrology; On immunotherapy; continue with Dr. Maudie Mercury as scheduled; Stable; under care of cardiology; Stable; under care of cardiology;  Stable; refill updated; Stable; refill updated;   This visit occurred during the SARS-CoV-2 public health  emergency.  Safety protocols were in place, including screening questions prior to the visit, additional usage of staff PPE, and extensive cleaning of exam room  while observing appropriate contact time as indicated for disinfecting solutions.    No follow-ups on file.  Orders Placed This Encounter  Procedures   Antinuclear Antib (ANA)   Sedimentation rate   Rheumatoid Factor   CK (Creatine Kinase)   CBC with Differential/Platelet   Iron, TIBC and Ferritin Panel   B12   Hepatitis C Antibody   Lipid panel   Ambulatory referral to Hematology / Oncology    Referral Priority:   Routine    Referral Type:   Consultation    Referral Reason:   Specialty Services Required    Requested Specialty:   Oncology    Number of Visits Requested:   1    Requested Prescriptions   Signed Prescriptions Disp Refills   gabapentin (NEURONTIN) 300 MG capsule 270 capsule 1    Sig: Take 1 capsule (300 mg total) by mouth 3 (three) times daily.   lisinopril (ZESTRIL) 20 MG tablet 90 tablet 3    Sig: Take 1 tablet (20 mg total) by mouth daily.   pantoprazole (PROTONIX) 40 MG tablet 60 tablet 0    Sig: Take 1 tablet (40 mg total) by mouth in the morning and at bedtime.   rosuvastatin (CRESTOR) 40 MG tablet 30 tablet 3    Sig: Take 1 tablet (40 mg total) by mouth at bedtime.

## 2021-01-31 NOTE — Patient Instructions (Addendum)
Schedule a follow up with your ophthalmologist;  We will refer you to rheumatology;  Please get your COVID booster as we discussed; please get your vaccine records as we discussed;

## 2021-02-01 ENCOUNTER — Encounter: Payer: Self-pay | Admitting: Family

## 2021-02-01 ENCOUNTER — Encounter (INDEPENDENT_AMBULATORY_CARE_PROVIDER_SITE_OTHER): Payer: Medicare Other | Admitting: Podiatry

## 2021-02-01 DIAGNOSIS — H43812 Vitreous degeneration, left eye: Secondary | ICD-10-CM | POA: Diagnosis not present

## 2021-02-01 NOTE — Progress Notes (Signed)
This encounter was created in error - please disregard.

## 2021-02-02 ENCOUNTER — Other Ambulatory Visit: Payer: Self-pay | Admitting: Family

## 2021-02-04 ENCOUNTER — Other Ambulatory Visit: Payer: Self-pay | Admitting: Family

## 2021-02-04 DIAGNOSIS — G8929 Other chronic pain: Secondary | ICD-10-CM

## 2021-02-04 DIAGNOSIS — M791 Myalgia, unspecified site: Secondary | ICD-10-CM

## 2021-02-04 DIAGNOSIS — M25551 Pain in right hip: Secondary | ICD-10-CM

## 2021-02-04 LAB — RHEUMATOID FACTOR: Rheumatoid fact SerPl-aCnc: <14 IU/mL (ref <14)

## 2021-02-04 LAB — HEPATITIS C ANTIBODY
Hepatitis C Ab: NONREACTIVE
SIGNAL TO CUT-OFF: 0.06 (ref <1.00)

## 2021-02-04 LAB — IRON,TIBC AND FERRITIN PANEL
%SAT: 20 % (calc) (ref 16–45)
Ferritin: 20 ng/mL (ref 16–288)
Iron: 74 ug/dL (ref 45–160)
TIBC: 368 mcg/dL (calc) (ref 250–450)

## 2021-02-04 LAB — ANA: Anti Nuclear Antibody (ANA): NEGATIVE

## 2021-02-05 NOTE — Telephone Encounter (Signed)
Called and left a message for patient to inquire if she was still in need of the Breztri samples as she has yet to pick them up. If patient doesn't respond by February 14, 2021 I will place samples back in sample cabinet as other patients are in need as well.

## 2021-02-07 ENCOUNTER — Other Ambulatory Visit: Payer: Self-pay | Admitting: Cardiology

## 2021-02-11 ENCOUNTER — Other Ambulatory Visit: Payer: Self-pay | Admitting: Family

## 2021-02-11 ENCOUNTER — Encounter (HOSPITAL_COMMUNITY): Payer: Self-pay | Admitting: Gastroenterology

## 2021-02-11 DIAGNOSIS — M9903 Segmental and somatic dysfunction of lumbar region: Secondary | ICD-10-CM | POA: Diagnosis not present

## 2021-02-11 DIAGNOSIS — D649 Anemia, unspecified: Secondary | ICD-10-CM

## 2021-02-11 DIAGNOSIS — M545 Low back pain, unspecified: Secondary | ICD-10-CM | POA: Diagnosis not present

## 2021-02-11 DIAGNOSIS — M546 Pain in thoracic spine: Secondary | ICD-10-CM | POA: Diagnosis not present

## 2021-02-11 DIAGNOSIS — M62830 Muscle spasm of back: Secondary | ICD-10-CM | POA: Diagnosis not present

## 2021-02-11 DIAGNOSIS — M9901 Segmental and somatic dysfunction of cervical region: Secondary | ICD-10-CM | POA: Diagnosis not present

## 2021-02-11 DIAGNOSIS — M9902 Segmental and somatic dysfunction of thoracic region: Secondary | ICD-10-CM | POA: Diagnosis not present

## 2021-02-11 DIAGNOSIS — M542 Cervicalgia: Secondary | ICD-10-CM | POA: Diagnosis not present

## 2021-02-11 DIAGNOSIS — M62838 Other muscle spasm: Secondary | ICD-10-CM | POA: Diagnosis not present

## 2021-02-12 ENCOUNTER — Inpatient Hospital Stay: Payer: Medicare Other | Attending: Hematology & Oncology

## 2021-02-12 ENCOUNTER — Other Ambulatory Visit: Payer: Self-pay

## 2021-02-12 ENCOUNTER — Ambulatory Visit (INDEPENDENT_AMBULATORY_CARE_PROVIDER_SITE_OTHER): Payer: Medicare Other | Admitting: *Deleted

## 2021-02-12 ENCOUNTER — Encounter: Payer: Self-pay | Admitting: Family

## 2021-02-12 ENCOUNTER — Inpatient Hospital Stay (HOSPITAL_BASED_OUTPATIENT_CLINIC_OR_DEPARTMENT_OTHER): Payer: Medicare Other | Admitting: Family

## 2021-02-12 VITALS — BP 153/75 | HR 77 | Temp 98.8°F | Resp 18 | Wt 247.1 lb

## 2021-02-12 DIAGNOSIS — D509 Iron deficiency anemia, unspecified: Secondary | ICD-10-CM

## 2021-02-12 DIAGNOSIS — Z803 Family history of malignant neoplasm of breast: Secondary | ICD-10-CM | POA: Diagnosis not present

## 2021-02-12 DIAGNOSIS — I129 Hypertensive chronic kidney disease with stage 1 through stage 4 chronic kidney disease, or unspecified chronic kidney disease: Secondary | ICD-10-CM | POA: Diagnosis not present

## 2021-02-12 DIAGNOSIS — Z801 Family history of malignant neoplasm of trachea, bronchus and lung: Secondary | ICD-10-CM | POA: Diagnosis not present

## 2021-02-12 DIAGNOSIS — I5032 Chronic diastolic (congestive) heart failure: Secondary | ICD-10-CM | POA: Diagnosis not present

## 2021-02-12 DIAGNOSIS — J309 Allergic rhinitis, unspecified: Secondary | ICD-10-CM | POA: Diagnosis not present

## 2021-02-12 DIAGNOSIS — R809 Proteinuria, unspecified: Secondary | ICD-10-CM | POA: Diagnosis not present

## 2021-02-12 DIAGNOSIS — Z87891 Personal history of nicotine dependence: Secondary | ICD-10-CM | POA: Diagnosis not present

## 2021-02-12 DIAGNOSIS — Z806 Family history of leukemia: Secondary | ICD-10-CM | POA: Diagnosis not present

## 2021-02-12 DIAGNOSIS — Z794 Long term (current) use of insulin: Secondary | ICD-10-CM

## 2021-02-12 DIAGNOSIS — N182 Chronic kidney disease, stage 2 (mild): Secondary | ICD-10-CM | POA: Diagnosis not present

## 2021-02-12 DIAGNOSIS — E1140 Type 2 diabetes mellitus with diabetic neuropathy, unspecified: Secondary | ICD-10-CM | POA: Diagnosis not present

## 2021-02-12 DIAGNOSIS — E1122 Type 2 diabetes mellitus with diabetic chronic kidney disease: Secondary | ICD-10-CM | POA: Diagnosis not present

## 2021-02-12 DIAGNOSIS — Z7984 Long term (current) use of oral hypoglycemic drugs: Secondary | ICD-10-CM | POA: Diagnosis not present

## 2021-02-12 DIAGNOSIS — N2581 Secondary hyperparathyroidism of renal origin: Secondary | ICD-10-CM | POA: Diagnosis not present

## 2021-02-12 DIAGNOSIS — D649 Anemia, unspecified: Secondary | ICD-10-CM

## 2021-02-12 DIAGNOSIS — Z79899 Other long term (current) drug therapy: Secondary | ICD-10-CM | POA: Diagnosis not present

## 2021-02-12 DIAGNOSIS — E559 Vitamin D deficiency, unspecified: Secondary | ICD-10-CM | POA: Diagnosis not present

## 2021-02-12 LAB — CBC WITH DIFFERENTIAL (CANCER CENTER ONLY)
Abs Immature Granulocytes: 0.02 10*3/uL (ref 0.00–0.07)
Basophils Absolute: 0 10*3/uL (ref 0.0–0.1)
Basophils Relative: 1 %
Eosinophils Absolute: 0.2 10*3/uL (ref 0.0–0.5)
Eosinophils Relative: 3 %
HCT: 41.5 % (ref 36.0–46.0)
Hemoglobin: 13 g/dL (ref 12.0–15.0)
Immature Granulocytes: 0 %
Lymphocytes Relative: 37 %
Lymphs Abs: 2 10*3/uL (ref 0.7–4.0)
MCH: 26.5 pg (ref 26.0–34.0)
MCHC: 31.3 g/dL (ref 30.0–36.0)
MCV: 84.5 fL (ref 80.0–100.0)
Monocytes Absolute: 0.5 10*3/uL (ref 0.1–1.0)
Monocytes Relative: 9 %
Neutro Abs: 2.6 10*3/uL (ref 1.7–7.7)
Neutrophils Relative %: 50 %
Platelet Count: 253 10*3/uL (ref 150–400)
RBC: 4.91 MIL/uL (ref 3.87–5.11)
RDW: 16.8 % — ABNORMAL HIGH (ref 11.5–15.5)
WBC Count: 5.2 10*3/uL (ref 4.0–10.5)
nRBC: 0 % (ref 0.0–0.2)

## 2021-02-12 LAB — IRON AND IRON BINDING CAPACITY (CC-WL,HP ONLY)
Iron: 91 ug/dL (ref 28–170)
Saturation Ratios: 21 % (ref 10.4–31.8)
TIBC: 438 ug/dL (ref 250–450)
UIBC: 347 ug/dL (ref 148–442)

## 2021-02-12 LAB — CMP (CANCER CENTER ONLY)
ALT: 13 U/L (ref 0–44)
AST: 13 U/L — ABNORMAL LOW (ref 15–41)
Albumin: 4.2 g/dL (ref 3.5–5.0)
Alkaline Phosphatase: 57 U/L (ref 38–126)
Anion gap: 10 (ref 5–15)
BUN: 15 mg/dL (ref 8–23)
CO2: 24 mmol/L (ref 22–32)
Calcium: 9.4 mg/dL (ref 8.9–10.3)
Chloride: 106 mmol/L (ref 98–111)
Creatinine: 0.99 mg/dL (ref 0.44–1.00)
GFR, Estimated: >60 mL/min (ref >60)
Glucose, Bld: 86 mg/dL (ref 70–99)
Potassium: 3.9 mmol/L (ref 3.5–5.1)
Sodium: 140 mmol/L (ref 135–145)
Total Bilirubin: 0.5 mg/dL (ref 0.3–1.2)
Total Protein: 7.6 g/dL (ref 6.5–8.1)

## 2021-02-12 LAB — LACTATE DEHYDROGENASE: LDH: 155 U/L (ref 98–192)

## 2021-02-12 LAB — RETICULOCYTES
Immature Retic Fract: 16.6 % — ABNORMAL HIGH (ref 2.3–15.9)
RBC.: 4.99 MIL/uL (ref 3.87–5.11)
Retic Count, Absolute: 103.8 10*3/uL (ref 19.0–186.0)
Retic Ct Pct: 2.1 % (ref 0.4–3.1)

## 2021-02-12 LAB — FERRITIN: Ferritin: 25 ng/mL (ref 11–307)

## 2021-02-12 LAB — SAVE SMEAR(SSMR), FOR PROVIDER SLIDE REVIEW

## 2021-02-12 NOTE — Progress Notes (Signed)
Hematology/Oncology Consultation   Name: Katrina Vega      MRN: [id number]    Location: Room/bed info not found  Date: 02/12/2021 Time:9:19 AM   REFERRING PHYSICIAN: Sherlene Shams, FNP  REASON FOR CONSULT: Iron deficiency anemia   DIAGNOSIS: Iron deficiency anemia   HISTORY OF PRESENT ILLNESS: Katrina Vega is a very pleasant 65 yo African American female with long history of iron deficiency anemia.  She is unable to tolerate oral iron due to GI upset and severe constipation.  She has received IV iron and blood transfusions in the past for her anemia.  Her last dose of IV iron was in 2021 while she was still living in [place].  She is symptomatic with fatigue, craving ice and SOB with exertion.  She is scheduled for EGD and colonoscopy on 02/22/2021 for further eval.  She has not noted any obvious blood loss. No abnormal bruising, no petechiae.  She states that she had issues with hemorrhaging with her cycle and had a hysterectomy over 30 years ago. 2 of her daughters have also had to have a hysterectomy for the same issue and her third daughter had a uterine ablation to stop her cycle.  She has had multiple surgeries in the past and states that she only required a blood transfusion after her knee replacement.  She has 5 children, no history of miscarriage.  No known sickle cell disease or trait.  No personal history of cancer. Her maternal cousin had leukemia and passed away at 70 yo. Her mother and maternal cousin had breast cancer and her father had lung cancer (smoker).  She had her mammogram in June and result was negative.  She states that she has history of CAD several years ago with PCI to the LAD and ramus in 2008 with implantation of Taxus stents. She had an MI in 2013 with stent to the RCA. She in on Plavix PO daily.  She has history of PAD with right posterior tibial DES stent placed and bilateral SFA stenting in 2016. She had an atherectomy for in stent restenosis in 2019.  She takes  nitro occasionally for any chest discomfort or tightness.  She has history of GERD.  She is diabetic and states that this is fairly well controlled on her current medication regimen.  No history of thyroid disease.  She has neuropathy in her feet and hands. She is on Neurontin.  She has generalized aches and pains due to arthritis.  No falls or syncope to report.  No swelling in her extremities at this time.  No fever, chills, n/v, cough, rash, chest pain, palpitations, abdominal pain or changes in bowel or bladder habits.  She quit smoking 1 ppd over 30 years ago. She has history of COPD.  No ETOH or recreational drug use.  She has a good appetite and is staying well hydrated throughout the day.  Her weight is described as stable at 247 lbs.  She enjoys stretching for exercise.  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   ROS: All other 10 point review of systems is negative.   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     azelastine (ASTELIN) 0.1 % nasal spray 1-2 sprays per nostril 2 times daily as needed for  runny nose. 90 mL 2   BD AUTOSHIELD DUO 30G X 5 MM MISC 1 KIT BY MISC ROUTE 3 TIMES DAILY BEFORE MEALS     Budeson-Glycopyrrol-Formoterol (BREZTRI AEROSPHERE) 160-9-4.8 MCG/ACT AERO Inhale 2 puffs into the lungs in the morning and at bedtime. 10.7 g 5   clopidogrel (PLAVIX) 75 MG tablet TAKE 1 TABLET BY MOUTH  DAILY 90 tablet 0   Continuous Blood Gluc Sensor (DEXCOM G6 SENSOR) MISC USE 1 SENSOR AS NEEDED CHANGE EVERY 10 DAYS     cromolyn (OPTICROM) 4 % ophthalmic solution Place 1 drop into both eyes 4 (four) times daily as needed (itchy/watery eyes). 30 mL 0   empagliflozin (JARDIANCE) 10 MG TABS tablet Take 1 tablet by mouth daily.     EPINEPHrine (EPIPEN 2-PAK) 0.3 mg/0.3 mL IJ SOAJ injection Use as directed for severe allergic reactions 2 each 3   ferrous sulfate 325 (65 FE) MG tablet Take 325 mg by mouth in the morning, at noon, and at bedtime.     gabapentin (NEURONTIN) 300 MG capsule Take 1 capsule (300 mg total) by mouth 3 (three) times daily. 270 capsule 1   glucagon 1 MG injection Inject into the muscle as needed.     glucose 4 GM chewable tablet Chew by mouth as needed.     HUMULIN N 100 UNIT/ML injection Inject into the skin.     isosorbide mononitrate (IMDUR) 60 MG 24 hr tablet TAKE 1 TABLET BY MOUTH  DAILY 90 tablet 3   ketotifen (ZADITOR) 0.025 % ophthalmic solution 1 drop 2 (two) times daily.     levocetirizine (XYZAL) 5 MG tablet Take 1 tablet (5 mg total) by mouth every evening. 90 tablet 2   lisinopril (ZESTRIL) 20 MG tablet Take 1 tablet (20 mg total) by mouth daily. 90 tablet 3   metoprolol tartrate (LOPRESSOR) 50 MG tablet TAKE 1 TABLET BY MOUTH  EVERY 12 HOURS 90 tablet 7   nitroGLYCERIN (NITROSTAT) 0.4 MG SL tablet Place under the tongue as needed.     Olopatadine HCl 0.2 % SOLN Apply 1 drop to eye daily as needed (itchy/watery eyes). 2.5 mL 5   pantoprazole (PROTONIX) 40 MG tablet TAKE 1 TABLET(40 MG) BY MOUTH IN THE MORNING AND AT BEDTIME 180 tablet 0   polyethylene  glycol (MIRALAX / GLYCOLAX) 17 g packet Take 17 g by mouth daily as needed.     psyllium (METAMUCIL) 58.6 % packet Take by mouth.     Respiratory Therapy Supplies (CARETOUCH 2 CPAP HOSE HANGER) MISC by Does not apply route.     rosuvastatin (CRESTOR) 40 MG tablet Take 1 tablet (40 mg total) by mouth at bedtime. 30 tablet 3   Semaglutide, 1 MG/DOSE, (OZEMPIC, 1 MG/DOSE,) 4  MG/3ML SOPN Inject into the skin once a week.     Tiotropium Bromide Monohydrate (SPIRIVA RESPIMAT) 1.25 MCG/ACT AERS Inhale 2 puffs into the lungs daily. 3 each 2   torsemide (DEMADEX) 20 MG tablet Take 1 tablet (20 mg total) by mouth daily as needed (Fluid build up). 90 tablet 0   triamcinolone (NASACORT) 55 MCG/ACT AERO nasal inhaler as needed.     VALIUM 5 MG tablet Take 5 mg by mouth as needed.     vitamin B-12 (CYANOCOBALAMIN) 500 MCG tablet Take 500 mcg by mouth daily.     Vitamin D, Ergocalciferol, (DRISDOL) 1.25 MG (50000 UNIT) CAPS capsule Take 50,000 Units by mouth once a week.     insulin regular human CONCENTRATED (HUMULIN R) 500 UNIT/ML kwikpen Inject 80 Units into the skin daily.     No current facility-administered medications on file prior to visit.     PAST SURGICAL HISTORY Past Surgical History:  Procedure Laterality Date   APPENDECTOMY  1974   CARPAL TUNNEL RELEASE  2017   LUMBAR SPINE SURGERY  2010   REPLACEMENT TOTAL KNEE  2017 and 2018   TONSILLECTOMY     TOTAL ABDOMINAL HYSTERECTOMY  1989   TOTAL HIP ARTHROPLASTY  04/2018   TOTAL SHOULDER ARTHROPLASTY  11/2019    FAMILY HISTORY: Family History  Problem Relation Age of Onset   Heart disease Mother    Breast cancer Mother    Heart attack Father    Lung cancer Father    Eczema Grandson    Allergic rhinitis Neg Hx    Angioedema Neg Hx    Asthma Neg Hx    Atopy Neg Hx    Immunodeficiency Neg Hx    Urticaria Neg Hx     SOCIAL HISTORY:  reports that she quit smoking about 25 years ago. Her smoking use included cigarettes. She has a 7.50  pack-year smoking history. She has never used smokeless tobacco. She reports that she does not currently use alcohol. She reports that she does not use drugs.  PERFORMANCE STATUS: The patient's performance status is 1 - Symptomatic but completely ambulatory  PHYSICAL EXAM: Most Recent Vital Signs: Blood pressure (!) 153/75, pulse 77, temperature 98.8 F (37.1 C), temperature source Oral, resp. rate 18, weight 247 lb 1.9 oz (112.1 kg), SpO2 99 %. BP (!) 153/75 (BP Location: Right Arm, Patient Position: Sitting)    Pulse 77    Temp 98.8 F (37.1 C) (Oral)    Resp 18    Wt 247 lb 1.9 oz (112.1 kg)    SpO2 99%    BMI 41.12 kg/m   General Appearance:    Alert, cooperative, no distress, appears stated age  Head:    Normocephalic, without obvious abnormality, atraumatic  Eyes:    PERRL, conjunctiva/corneas clear, EOM's intact, fundi    benign, both eyes        Throat:   Lips, mucosa, and tongue normal; teeth and gums normal  Neck:   Supple, symmetrical, trachea midline, no adenopathy;    thyroid:  no enlargement/tenderness/nodules; no carotid   bruit or JVD  Back:     Symmetric, no curvature, ROM normal, no CVA tenderness  Lungs:     Clear to auscultation bilaterally, respirations unlabored  Chest Wall:    No tenderness or deformity   Heart:    Regular rate and rhythm, S1 and S2 normal, no murmur, rub   or gallop     Abdomen:     Soft, non-tender,  bowel sounds active all four quadrants,    no masses, no organomegaly        Extremities:   Extremities normal, atraumatic, no cyanosis or edema  Pulses:   2+ and symmetric all extremities  Skin:   Skin color, texture, turgor normal, no rashes or lesions  Lymph nodes:   Cervical, supraclavicular, and axillary nodes normal  Neurologic:   CNII-XII intact, normal strength, sensation and reflexes    throughout    LABORATORY DATA:  Results for orders placed or performed in visit on 02/12/21 (from the past 48 hour(s))  CBC with Differential  (Cancer Center Only)     Status: Abnormal   Collection Time: 02/12/21  8:49 AM  Result Value Ref Range   WBC Count 5.2 4.0 - 10.5 K/uL   RBC 4.91 3.87 - 5.11 MIL/uL   Hemoglobin 13.0 12.0 - 15.0 g/dL   HCT 41.5 36.0 - 46.0 %   MCV 84.5 80.0 - 100.0 fL   MCH 26.5 26.0 - 34.0 pg   MCHC 31.3 30.0 - 36.0 g/dL   RDW 16.8 (H) 11.5 - 15.5 %   Platelet Count 253 150 - 400 K/uL   nRBC 0.0 0.0 - 0.2 %   Neutrophils Relative % 50 %   Neutro Abs 2.6 1.7 - 7.7 K/uL   Lymphocytes Relative 37 %   Lymphs Abs 2.0 0.7 - 4.0 K/uL   Monocytes Relative 9 %   Monocytes Absolute 0.5 0.1 - 1.0 K/uL   Eosinophils Relative 3 %   Eosinophils Absolute 0.2 0.0 - 0.5 K/uL   Basophils Relative 1 %   Basophils Absolute 0.0 0.0 - 0.1 K/uL   Immature Granulocytes 0 %   Abs Immature Granulocytes 0.02 0.00 - 0.07 K/uL    Comment: Performed at [facility] at [facility], [address], [place], [place] [address]  Save Smear [facility])     Status: None   Collection Time: 02/12/21  8:49 AM  Result Value Ref Range   Smear Review SMEAR STAINED AND AVAILABLE FOR REVIEW     Comment: Performed at [facility] at [facility], [address] [place], [place] [address]  Reticulocytes     Status: Abnormal   Collection Time: 02/12/21  8:50 AM  Result Value Ref Range   Retic Ct Pct 2.1 0.4 - 3.1 %   RBC. 4.99 3.87 - 5.11 MIL/uL   Retic Count, Absolute 103.8 19.0 - 186.0 K/uL   Immature Retic Fract 16.6 (H) 2.3 - 15.9 %    Comment: Performed at [facility] at [facility], [address] [place], [place] [address]      RADIOGRAPHY: No results found.     PATHOLOGY: None  ASSESSMENT/PLAN: Ms. Llewellyn is a very pleasant 65 yo African American female with long history of iron deficiency anemia.  Anemia work up is pending. We will get her set up for IV iron if needed.  She will stop the oral iron as it is causing her GI upset and severe  constipation.  Follow-up in 3 months.   All questions were answered. The patient knows to call the clinic with any problems, questions or concerns. We can certainly see the patient much sooner if necessary.   Lottie Dawson

## 2021-02-13 ENCOUNTER — Telehealth: Payer: Self-pay | Admitting: *Deleted

## 2021-02-13 LAB — ERYTHROPOIETIN: Erythropoietin: 27.8 m[IU]/mL — ABNORMAL HIGH (ref 2.6–18.5)

## 2021-02-13 NOTE — Telephone Encounter (Signed)
Per 02/12/21 los called and lvm of upcoming appointments - requested call back to confirm

## 2021-02-14 DIAGNOSIS — E119 Type 2 diabetes mellitus without complications: Secondary | ICD-10-CM | POA: Diagnosis not present

## 2021-02-15 ENCOUNTER — Other Ambulatory Visit: Payer: Self-pay | Admitting: Family

## 2021-02-15 DIAGNOSIS — H43812 Vitreous degeneration, left eye: Secondary | ICD-10-CM | POA: Diagnosis not present

## 2021-02-15 DIAGNOSIS — S0502XA Injury of conjunctiva and corneal abrasion without foreign body, left eye, initial encounter: Secondary | ICD-10-CM | POA: Diagnosis not present

## 2021-02-15 DIAGNOSIS — H04123 Dry eye syndrome of bilateral lacrimal glands: Secondary | ICD-10-CM | POA: Diagnosis not present

## 2021-02-15 DIAGNOSIS — D509 Iron deficiency anemia, unspecified: Secondary | ICD-10-CM

## 2021-02-22 ENCOUNTER — Ambulatory Visit (HOSPITAL_COMMUNITY)
Admission: RE | Admit: 2021-02-22 | Discharge: 2021-02-22 | Disposition: A | Discharge status: Home / Self Care | Payer: Medicare Other | Attending: Gastroenterology | Admitting: Gastroenterology

## 2021-02-22 ENCOUNTER — Other Ambulatory Visit: Payer: Self-pay

## 2021-02-22 ENCOUNTER — Ambulatory Visit (HOSPITAL_COMMUNITY): Payer: Medicare Other | Admitting: Certified Registered Nurse Anesthetist

## 2021-02-22 ENCOUNTER — Encounter (HOSPITAL_COMMUNITY)
Admission: RE | Disposition: A | Discharge status: Home / Self Care | Payer: Self-pay | Source: Home / Self Care | Attending: Gastroenterology

## 2021-02-22 DIAGNOSIS — J449 Chronic obstructive pulmonary disease, unspecified: Secondary | ICD-10-CM | POA: Diagnosis not present

## 2021-02-22 DIAGNOSIS — I252 Old myocardial infarction: Secondary | ICD-10-CM | POA: Insufficient documentation

## 2021-02-22 DIAGNOSIS — K222 Esophageal obstruction: Secondary | ICD-10-CM | POA: Diagnosis not present

## 2021-02-22 DIAGNOSIS — Z7984 Long term (current) use of oral hypoglycemic drugs: Secondary | ICD-10-CM | POA: Diagnosis not present

## 2021-02-22 DIAGNOSIS — E119 Type 2 diabetes mellitus without complications: Secondary | ICD-10-CM | POA: Insufficient documentation

## 2021-02-22 DIAGNOSIS — I509 Heart failure, unspecified: Secondary | ICD-10-CM | POA: Insufficient documentation

## 2021-02-22 DIAGNOSIS — Z86010 Personal history of colonic polyps: Secondary | ICD-10-CM | POA: Insufficient documentation

## 2021-02-22 DIAGNOSIS — K3189 Other diseases of stomach and duodenum: Secondary | ICD-10-CM | POA: Diagnosis not present

## 2021-02-22 DIAGNOSIS — D123 Benign neoplasm of transverse colon: Secondary | ICD-10-CM | POA: Diagnosis not present

## 2021-02-22 DIAGNOSIS — I110 Hypertensive heart disease with heart failure: Secondary | ICD-10-CM | POA: Insufficient documentation

## 2021-02-22 DIAGNOSIS — K635 Polyp of colon: Secondary | ICD-10-CM | POA: Diagnosis not present

## 2021-02-22 DIAGNOSIS — Z1211 Encounter for screening for malignant neoplasm of colon: Secondary | ICD-10-CM | POA: Insufficient documentation

## 2021-02-22 DIAGNOSIS — Z6841 Body Mass Index (BMI) 40.0 and over, adult: Secondary | ICD-10-CM | POA: Diagnosis not present

## 2021-02-22 DIAGNOSIS — I2510 Atherosclerotic heart disease of native coronary artery without angina pectoris: Secondary | ICD-10-CM | POA: Diagnosis not present

## 2021-02-22 DIAGNOSIS — Z87891 Personal history of nicotine dependence: Secondary | ICD-10-CM | POA: Diagnosis not present

## 2021-02-22 DIAGNOSIS — K219 Gastro-esophageal reflux disease without esophagitis: Secondary | ICD-10-CM | POA: Insufficient documentation

## 2021-02-22 DIAGNOSIS — K5730 Diverticulosis of large intestine without perforation or abscess without bleeding: Secondary | ICD-10-CM | POA: Diagnosis not present

## 2021-02-22 DIAGNOSIS — D124 Benign neoplasm of descending colon: Secondary | ICD-10-CM | POA: Insufficient documentation

## 2021-02-22 DIAGNOSIS — M1990 Unspecified osteoarthritis, unspecified site: Secondary | ICD-10-CM | POA: Insufficient documentation

## 2021-02-22 DIAGNOSIS — R12 Heartburn: Secondary | ICD-10-CM | POA: Diagnosis not present

## 2021-02-22 DIAGNOSIS — Z139 Encounter for screening, unspecified: Secondary | ICD-10-CM | POA: Diagnosis not present

## 2021-02-22 DIAGNOSIS — Z794 Long term (current) use of insulin: Secondary | ICD-10-CM | POA: Insufficient documentation

## 2021-02-22 HISTORY — PX: COLONOSCOPY WITH PROPOFOL: SHX5780

## 2021-02-22 HISTORY — PX: ESOPHAGOGASTRODUODENOSCOPY (EGD) WITH PROPOFOL: SHX5813

## 2021-02-22 HISTORY — PX: POLYPECTOMY: SHX5525

## 2021-02-22 HISTORY — PX: BIOPSY: SHX5522

## 2021-02-22 LAB — GLUCOSE, CAPILLARY: Glucose-Capillary: 138 mg/dL — ABNORMAL HIGH (ref 70–99)

## 2021-02-22 SURGERY — ESOPHAGOGASTRODUODENOSCOPY (EGD) WITH PROPOFOL
Anesthesia: Monitor Anesthesia Care

## 2021-02-22 MED ORDER — LIDOCAINE 2% (20 MG/ML) 5 ML SYRINGE
INTRAMUSCULAR | Status: DC | PRN
  Administered 2021-02-22: 40 mg via INTRAVENOUS

## 2021-02-22 MED ORDER — PROPOFOL 1000 MG/100ML IV EMUL
INTRAVENOUS | Status: AC
  Filled 2021-02-22: qty 100

## 2021-02-22 MED ORDER — PROPOFOL 500 MG/50ML IV EMUL
INTRAVENOUS | Status: DC | PRN
  Administered 2021-02-22: 125 ug/kg/min via INTRAVENOUS

## 2021-02-22 MED ORDER — ONDANSETRON HCL 4 MG/2ML IJ SOLN
INTRAMUSCULAR | Status: DC | PRN
  Administered 2021-02-22: 4 mg via INTRAVENOUS

## 2021-02-22 MED ORDER — PROPOFOL 10 MG/ML IV BOLUS
INTRAVENOUS | Status: DC | PRN
  Administered 2021-02-22: 20 mg via INTRAVENOUS
  Administered 2021-02-22: 30 mg via INTRAVENOUS

## 2021-02-22 MED ORDER — LACTATED RINGERS IV SOLN
INTRAVENOUS | Status: DC | PRN
Start: 2021-02-22 — End: 2021-02-22

## 2021-02-22 MED ORDER — PHENYLEPHRINE 40 MCG/ML (10ML) SYRINGE FOR IV PUSH (FOR BLOOD PRESSURE SUPPORT)
PREFILLED_SYRINGE | INTRAVENOUS | Status: DC | PRN
  Administered 2021-02-22 (×2): 80 ug via INTRAVENOUS

## 2021-02-22 SURGICAL SUPPLY — 25 items

## 2021-02-22 NOTE — Transfer of Care (Signed)
Immediate Anesthesia Transfer of Care Note  Patient: Katrina Vega  Procedure(s) Performed: ESOPHAGOGASTRODUODENOSCOPY (EGD) WITH PROPOFOL COLONOSCOPY WITH PROPOFOL BIOPSY POLYPECTOMY  Patient Location: PACU  Anesthesia Type:MAC  Level of Consciousness: awake and patient cooperative  Airway & Oxygen Therapy: Patient Spontanous Breathing and Patient connected to face mask  Post-op Assessment: Report given to RN and Post -op Vital signs reviewed and stable  Post vital signs: Reviewed and stable  Last Vitals:  Vitals Value Taken Time  BP 90/49 02/22/21 0913  Temp    Pulse 84 02/22/21 0914  Resp 21 02/22/21 0914  SpO2 97 % 02/22/21 0914  Vitals shown include unvalidated device data.  Last Pain:  Vitals:   02/22/21 0750  TempSrc: Oral  PainSc: 0-No pain         Complications: No notable events documented.

## 2021-02-22 NOTE — Anesthesia Postprocedure Evaluation (Signed)
Anesthesia Post Note  Patient: [name]  Procedure(s) Performed: ESOPHAGOGASTRODUODENOSCOPY (EGD) WITH PROPOFOL COLONOSCOPY WITH PROPOFOL BIOPSY POLYPECTOMY     Patient location during evaluation: Endoscopy Anesthesia Type: MAC Level of consciousness: awake and alert, patient cooperative and oriented Pain management: pain level controlled Vital Signs Assessment: post-procedure vital signs reviewed and stable Respiratory status: spontaneous breathing, nonlabored ventilation and respiratory function stable Cardiovascular status: blood pressure returned to baseline and stable Postop Assessment: no apparent nausea or vomiting Anesthetic complications: no   No notable events documented.  Last Vitals:  Vitals:   02/22/21 0921 02/22/21 0930  BP:  113/65  Pulse: 80 68  Resp: 16 16  Temp:    SpO2: 95% 98%    Last Pain:  Vitals:   02/22/21 0914  TempSrc: Temporal  PainSc:                  Zamaya Rapaport,E. Tou Hayner

## 2021-02-22 NOTE — Anesthesia Procedure Notes (Signed)
Procedure Name: MAC Date/Time: 02/22/2021 8:30 AM Performed by: Claudia Desanctis, CRNA Pre-anesthesia Checklist: Patient identified, Emergency Drugs available, Suction available and Patient being monitored Patient Re-evaluated:Patient Re-evaluated prior to induction Oxygen Delivery Method: Simple face mask

## 2021-02-22 NOTE — Op Note (Signed)
[facility] Patient Name: Katrina Vega Procedure Date: 02/22/2021 MRN: [id number] Attending MD: Carol Ada , MD Date of Birth: [birth year] CSN: [id number] Age: 65 Admit Type: Outpatient Procedure:                Colonoscopy Indications:              High risk colon cancer surveillance: Personal                            history of colonic polyps Providers:                Carol Ada, MD, Jaci Carrel, RN, Luan Moore, Technician, Dellie Catholic Referring MD:              Medicines:                 Complications:            No immediate complications. Estimated Blood Loss:     Estimated blood loss: none. Procedure:                Pre-Anesthesia Assessment:                           - Prior to the procedure, a History and Physical                            was performed, and patient medications and                            allergies were reviewed. The patient's tolerance of                            previous anesthesia was also reviewed. The risks                            and benefits of the procedure and the sedation                            options and risks were discussed with the patient.                            All questions were answered, and informed consent                            was obtained. Prior Anticoagulants: The patient has                            taken no previous anticoagulant or antiplatelet                            agents. ASA Grade Assessment: III - A patient with                            severe systemic  disease. After reviewing the risks                            and benefits, the patient was deemed in                            satisfactory condition to undergo the procedure.                           - Sedation was administered by an anesthesia                            professional. Deep sedation was attained.                           After obtaining informed consent, the colonoscope                             was passed under direct vision. Throughout the                            procedure, the patient's blood pressure, pulse, and                            oxygen saturations were monitored continuously. The                            CF-HQ190L ([id number]) Olympus colonoscope was                            introduced through the anus and advanced to the the                            cecum, identified by appendiceal orifice and                            ileocecal valve. The colonoscopy was performed                            without difficulty. The patient tolerated the                            procedure well. The quality of the bowel                            preparation was evaluated using the BBPS [facility]                            Bowel Preparation Scale) with scores of: Right                            Colon = 3, Transverse Colon = 3 and Left Colon = 3                            (  entire mucosa seen well with no residual staining,                            small fragments of stool or opaque liquid). The                            total BBPS score equals 9. The ileocecal valve,                            appendiceal orifice, and rectum were photographed. Scope In: 8:40:26 AM Scope Out: 9:05:05 AM Scope Withdrawal Time: 0 hours 15 minutes 21 seconds  Total Procedure Duration: 0 hours 24 minutes 39 seconds  Findings:      Ten sessile polyps were found in the descending colon and transverse       colon. The polyps were 2 to 4 mm in size. These polyps were removed with       a cold snare. Resection and retrieval were complete.      Scattered small and large-mouthed diverticula were found in the sigmoid       colon. Impression:               - Ten 2 to 4 mm polyps in the descending colon and                            in the transverse colon, removed with a cold snare.                            Resected and retrieved.                           - Diverticulosis in  the sigmoid colon. Moderate Sedation:      Not Applicable - Patient had care per Anesthesia. Recommendation:           - Patient has a contact number available for                            emergencies. The signs and symptoms of potential                            delayed complications were discussed with the                            patient. Return to normal activities tomorrow.                            Written discharge instructions were provided to the                            patient.                           - Resume previous diet.                           - Continue present medications.                           -  Await pathology results.                           - Repeat colonoscopy in 3 years for surveillance. Procedure Code(s):        --- Professional ---                           [phone], Colonoscopy, flexible; with removal of                            tumor(s), polyp(s), or other lesion(s) by snare                            technique Diagnosis Code(s):        --- Professional ---                           K63.5, Polyp of colon                           Z86.010, Personal history of colonic polyps                           K57.30, Diverticulosis of large intestine without                            perforation or abscess without bleeding CPT copyright 2019 [employer]. All rights reserved. The codes documented in this report are preliminary and upon coder review may  be revised to meet current compliance requirements. Carol Ada, MD Carol Ada, MD 02/22/2021 9:10:53 AM This report has been signed electronically. Number of Addenda: 0

## 2021-02-22 NOTE — Op Note (Signed)
[facility] Patient Name: Katrina Vega Procedure Date: 02/22/2021 MRN: [id number] Attending MD: Carol Ada , MD Date of Birth: [birth year] CSN: [id number] Age: 65 Admit Type: Inpatient Procedure:                Upper GI endoscopy Indications:              Heartburn Providers:                Carol Ada, MD, Jaci Carrel, RN, Luan Moore, Technician, Dellie Catholic Referring MD:              Medicines:                Propofol per Anesthesia Complications:            No immediate complications. Estimated Blood Loss:     Estimated blood loss: none. Procedure:                Pre-Anesthesia Assessment:                           - Prior to the procedure, a History and Physical                            was performed, and patient medications and                            allergies were reviewed. The patient's tolerance of                            previous anesthesia was also reviewed. The risks                            and benefits of the procedure and the sedation                            options and risks were discussed with the patient.                            All questions were answered, and informed consent                            was obtained. Prior Anticoagulants: The patient has                            taken Plavix (clopidogrel), last dose was 5 days                            prior to procedure. ASA Grade Assessment: III - A                            patient with severe systemic disease. After  reviewing the risks and benefits, the patient was                            deemed in satisfactory condition to undergo the                            procedure.                           - Sedation was administered by an anesthesia                            professional. Deep sedation was attained.                           After obtaining informed consent, the endoscope was                             passed under direct vision. Throughout the                            procedure, the patient's blood pressure, pulse, and                            oxygen saturations were monitored continuously. The                            [id number]-H190 ([id number]) Olympus endoscope was introduced                            through the mouth, and advanced to the second part                            of duodenum. The upper GI endoscopy was                            accomplished without difficulty. The patient                            tolerated the procedure well. Scope In: Scope Out: Findings:      One benign-appearing, intrinsic mild stenosis was found in the lower       third of the esophagus. This stenosis measured 1.5 cm (inner diameter) x       less than one cm (in length). The stenosis was traversed.      Segmental mild mucosal variance characterized by altered texture was       found in the gastric body. Biopsies were taken with a cold forceps for       histology.      The examined duodenum was normal.      In the distal esophagus there was a nonobstructive eccentric stricture.       In the body of the gastric lumen, the mucosa was noted to be irregular.       The significance was uncertain and cold biopsies were obtained. Impression:               -  Benign-appearing esophageal stenosis.                           - Gastric mucosal variant. Biopsied.                           - Normal examined duodenum. Moderate Sedation:      Not Applicable - Patient had care per Anesthesia. Recommendation:           - Patient has a contact number available for                            emergencies. The signs and symptoms of potential                            delayed complications were discussed with the                            patient. Return to normal activities tomorrow.                            Written discharge instructions were provided to the                            patient.                            - Resume previous diet.                           - Continue present medications.                           - Await pathology results.                           - Continue with pantoprazole.                           - Resume Plavix.                           - Follow up in one month. Procedure Code(s):        --- Professional ---                           [phone], Esophagogastroduodenoscopy, flexible,                            transoral; with biopsy, single or multiple Diagnosis Code(s):        --- Professional ---                           K31.89, Other diseases of stomach and duodenum                           R12, Heartburn CPT copyright 2019 American Medical Association. All rights reserved. The codes  documented in this report are preliminary and upon coder review may  be revised to meet current compliance requirements. Carol Ada, MD Carol Ada, MD 02/22/2021 9:15:35 AM This report has been signed electronically. Number of Addenda: 0

## 2021-02-22 NOTE — Anesthesia Preprocedure Evaluation (Addendum)
Anesthesia Evaluation  Patient identified by MRN, date of birth, ID band Patient awake    Reviewed: Allergy & Precautions, NPO status , Patient's Chart, lab work & pertinent test results, reviewed documented beta blocker date and time   History of Anesthesia Complications Negative for: history of anesthetic complications  Airway Mallampati: I  TM Distance: >3 FB Neck ROM: Full    Dental  (+) Edentulous Upper, Missing, Dental Advisory Given   Pulmonary asthma , sleep apnea and Continuous Positive Airway Pressure Ventilation , COPD,  COPD inhaler, former smoker,    breath sounds clear to auscultation       Cardiovascular hypertension, Pt. on medications and Pt. on home beta blockers (-) angina+ CAD, + Past MI and + Cardiac Stents   Rhythm:Regular Rate:Normal  '21 ECHO: Normal cavity size. Mild concentric hypertrophy observed. Hyperdynamic left ventricular function, ejection fraction >65%. No regional wall motion abnormalities of the left ventricle. Grade 1 DD, no significant valvular abnormalities   Neuro/Psych Anxiety negative neurological ROS     GI/Hepatic Neg liver ROS, GERD  Medicated and Controlled,  Endo/Other  diabetes (glu 138), Oral Hypoglycemic Agents, Insulin DependentMorbid obesity  Renal/GU Renal InsufficiencyRenal disease     Musculoskeletal  (+) Arthritis , Osteoarthritis,    Abdominal (+) + obese,   Peds  Hematology negative hematology ROS (+) plavix   Anesthesia Other Findings   Reproductive/Obstetrics                            Anesthesia Physical Anesthesia Plan  ASA: 3  Anesthesia Plan: MAC   Post-op Pain Management: Minimal or no pain anticipated   Induction:   PONV Risk Score and Plan: 2 and Ondansetron and Treatment may vary due to age or medical condition  Airway Management Planned: Natural Airway and Nasal Cannula  Additional Equipment: None  Intra-op Plan:    Post-operative Plan:   Informed Consent: I have reviewed the patients History and Physical, chart, labs and discussed the procedure including the risks, benefits and alternatives for the proposed anesthesia with the patient or authorized representative who has indicated his/her understanding and acceptance.     Dental advisory given  Plan Discussed with: CRNA and Surgeon  Anesthesia Plan Comments:        Anesthesia Quick Evaluation

## 2021-02-22 NOTE — H&P (Signed)
[name] HPI: At this time the patient denies any problems with nausea, vomiting, fevers, chills, abdominal pain, diarrhea, constipation, hematochezia, melena, or dysphagia. The patient denies any known family history of colon cancers. No complaints of chest pain or sleep apnea.  She has a history of an MI in 2015 and she has CHF.  Per her report her CHF was not severe.  Dr. Einar Gip is her Cardiologist.  Her SOB is stable and she has a history of COPD and asthma.  She reports having a colonoscopy in [place] 4 years ago, she thinks.  There is a history of polyps.  She cannot recall the physician.  Her GERD is a chronic issue and for three weeks she was off of the medication.  In the past when she resumed her pantoprazole her GERD control resumed, but it is different this time.   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    Past Surgical History:  Procedure Laterality Date   APPENDECTOMY  1974   CARPAL TUNNEL RELEASE  2017   LUMBAR SPINE SURGERY  2010   REPLACEMENT TOTAL KNEE  2017 and 2018   TONSILLECTOMY     TOTAL ABDOMINAL HYSTERECTOMY  1989   TOTAL HIP ARTHROPLASTY  04/2018   TOTAL SHOULDER ARTHROPLASTY  11/2019    Family History  Problem Relation Age of Onset   Heart disease Mother    Breast cancer Mother    Heart attack Father    Lung cancer Father    Eczema Grandson    Allergic rhinitis Neg Hx    Angioedema Neg Hx    Asthma Neg Hx    Atopy Neg Hx    Immunodeficiency Neg Hx    Urticaria Neg Hx     Social History:  reports that she quit smoking about 25 years ago. Her smoking use included cigarettes. She has a 7.50 pack-year smoking history. She has never used smokeless tobacco. She reports that she does not currently use alcohol. She  reports that she does not use drugs.  Allergies:  Allergies  Allergen Reactions   Contrast Media [Iodinated Contrast Media] Shortness Of Breath and Other (See Comments)    sob, chest tight   Morphine Itching   Statins Diarrhea and Other (See Comments)    nausea, diarrhea, myalgias    Medications: Scheduled: Continuous:  No results found for this or any previous visit (from the past 24 hour(s)).   No results found.  ROS:  As stated above in the HPI otherwise negative.  There were no vitals taken for this visit.    PE: Gen: NAD, Alert and Oriented HEENT:  [place]/AT, EOMI Neck: Supple, no LAD Lungs: CTA Bilaterally CV: RRR without M/G/R ABD: Soft, NTND, +BS Ext: No C/C/E  Assessment/Plan: 1) Personal history of polyps - colonoscopy. 2) GERD - EGD  Cecia Egge D 02/22/2021, 7:22 AM

## 2021-02-26 ENCOUNTER — Ambulatory Visit: Payer: Self-pay | Admitting: *Deleted

## 2021-02-26 ENCOUNTER — Encounter (HOSPITAL_COMMUNITY): Payer: Self-pay | Admitting: Gastroenterology

## 2021-02-26 ENCOUNTER — Ambulatory Visit (INDEPENDENT_AMBULATORY_CARE_PROVIDER_SITE_OTHER): Payer: Medicare Other | Admitting: *Deleted

## 2021-02-26 DIAGNOSIS — J309 Allergic rhinitis, unspecified: Secondary | ICD-10-CM | POA: Diagnosis not present

## 2021-02-26 LAB — SURGICAL PATHOLOGY

## 2021-03-06 ENCOUNTER — Ambulatory Visit (INDEPENDENT_AMBULATORY_CARE_PROVIDER_SITE_OTHER): Payer: Medicare Other

## 2021-03-06 DIAGNOSIS — Z7984 Long term (current) use of oral hypoglycemic drugs: Secondary | ICD-10-CM | POA: Diagnosis not present

## 2021-03-06 DIAGNOSIS — Z7985 Long-term (current) use of injectable non-insulin antidiabetic drugs: Secondary | ICD-10-CM | POA: Diagnosis not present

## 2021-03-06 DIAGNOSIS — J309 Allergic rhinitis, unspecified: Secondary | ICD-10-CM | POA: Diagnosis not present

## 2021-03-06 DIAGNOSIS — E1140 Type 2 diabetes mellitus with diabetic neuropathy, unspecified: Secondary | ICD-10-CM | POA: Diagnosis not present

## 2021-03-06 DIAGNOSIS — N182 Chronic kidney disease, stage 2 (mild): Secondary | ICD-10-CM | POA: Diagnosis not present

## 2021-03-06 DIAGNOSIS — Z794 Long term (current) use of insulin: Secondary | ICD-10-CM | POA: Diagnosis not present

## 2021-03-06 DIAGNOSIS — E1122 Type 2 diabetes mellitus with diabetic chronic kidney disease: Secondary | ICD-10-CM | POA: Diagnosis not present

## 2021-03-14 ENCOUNTER — Ambulatory Visit (INDEPENDENT_AMBULATORY_CARE_PROVIDER_SITE_OTHER): Payer: Medicare Other

## 2021-03-14 DIAGNOSIS — J309 Allergic rhinitis, unspecified: Secondary | ICD-10-CM | POA: Diagnosis not present

## 2021-03-15 ENCOUNTER — Encounter: Payer: Self-pay | Admitting: Allergy

## 2021-03-15 ENCOUNTER — Ambulatory Visit (INDEPENDENT_AMBULATORY_CARE_PROVIDER_SITE_OTHER): Payer: Medicare Other | Admitting: Family Medicine

## 2021-03-15 ENCOUNTER — Other Ambulatory Visit: Payer: Self-pay

## 2021-03-15 VITALS — BP 134/88 | HR 88 | Temp 97.9°F | Resp 16 | Ht 65.0 in | Wt 250.8 lb

## 2021-03-15 DIAGNOSIS — J3089 Other allergic rhinitis: Secondary | ICD-10-CM

## 2021-03-15 DIAGNOSIS — J4540 Moderate persistent asthma, uncomplicated: Secondary | ICD-10-CM

## 2021-03-15 DIAGNOSIS — K219 Gastro-esophageal reflux disease without esophagitis: Secondary | ICD-10-CM

## 2021-03-15 DIAGNOSIS — J449 Chronic obstructive pulmonary disease, unspecified: Secondary | ICD-10-CM

## 2021-03-15 DIAGNOSIS — H1013 Acute atopic conjunctivitis, bilateral: Secondary | ICD-10-CM | POA: Insufficient documentation

## 2021-03-15 DIAGNOSIS — J302 Other seasonal allergic rhinitis: Secondary | ICD-10-CM

## 2021-03-15 MED ORDER — ALBUTEROL SULFATE (2.5 MG/3ML) 0.083% IN NEBU: 2.5000 mg | INHALATION_SOLUTION | Freq: Four times a day (QID) | RESPIRATORY_TRACT | 2 refills | Status: DC | PRN

## 2021-03-15 MED ORDER — BREZTRI AEROSPHERE 160-9-4.8 MCG/ACT IN AERO: 2.0000 | INHALATION_SPRAY | Freq: Two times a day (BID) | RESPIRATORY_TRACT | 5 refills | Status: DC

## 2021-03-15 MED ORDER — ALBUTEROL SULFATE HFA 108 (90 BASE) MCG/ACT IN AERS: 2.0000 | INHALATION_SPRAY | Freq: Four times a day (QID) | RESPIRATORY_TRACT | 1 refills | Status: DC | PRN

## 2021-03-15 MED ORDER — MONTELUKAST SODIUM 10 MG PO TABS: 10.0000 mg | ORAL_TABLET | Freq: Every day | ORAL | 1 refills | Status: DC

## 2021-03-15 MED ORDER — CROMOLYN SODIUM 4 % OP SOLN: OPHTHALMIC | 5 refills | Status: DC

## 2021-03-15 NOTE — Patient Instructions (Addendum)
Asthma Begin montelukast 10 mg once a day to prevent cough or wheeze Continue Breztri 2 puffs twice a day with a spacer to prevent cough or wheeze Continue albuterol 2 puffs every 4 hours as needed for cough or wheeze OR Instead use albuterol 0.083% solution via nebulizer one unit vial every 4 hours as needed for cough or wheeze Continue to avoid cigarette smoke Information provided about Fasenra injections.  Allergic rhinitis Continue allergen avoidance measures directed toward dust mite, cockroach, weed pollen, cat, and dog Continue allergen immunotherapy and have access to an epinephrine autoinjector set Continue Nasacort 1 to 2 sprays in each nostril once a day as needed for nasal congestion. In the right nostril, point the applicator out toward the right ear. In the left nostril, point the applicator out toward the left ear Continue azelastine 2 sprays in each nostril up to twice a day as needed for runny nose Consider saline nasal rinses as needed for nasal symptoms. Use this before any medicated nasal sprays for best result Continue an antihistamine once a day as needed for runny nose or itch. Remember to rotate to a different antihistamine about every 3 months. Some examples of over the counter antihistamines include Zyrtec (cetirizine), Xyzal (levocetirizine), Allegra (fexofenadine), and Claritin (loratidine).   Allergic conjunctivitis Continue cromolyn 4% eyedrops.  Use 1 drop in each eye up to 4 times a day as needed for red, itchy, or watery eyes  Reflux Continue dietary and lifestyle modifications as listed below Continue pantoprazole as previously prescribed  Call the clinic if this treatment plan is not working well for you.  Follow up in 2 months or sooner if needed.   Lifestyle Changes for Controlling GERD When you have GERD, stomach acid feels as if its backing up toward your mouth. Whether or not you take medication to control your GERD, your symptoms can often  be improved with lifestyle changes.   Raise Your Head Reflux is more likely to strike when youre lying down flat, because stomach fluid can flow backward more easily. Raising the head of your bed 4-6 inches can help. To do this: Slide blocks or books under the legs at the head of your bed. Or, place a wedge under the mattress. Many foam stores can make a suitable wedge for you. The wedge should run from your waist to the top of your head. Dont just prop your head on several pillows. This increases pressure on your stomach. It can make GERD worse.  Watch Your Eating Habits Certain foods may increase the acid in your stomach or relax the lower esophageal sphincter, making GERD more likely. Its best to avoid the following: Coffee, tea, and carbonated drinks (with and without caffeine) Fatty, fried, or spicy food Mint, chocolate, onions, and tomatoes Any other foods that seem to irritate your stomach or cause you pain  Relieve the Pressure Eat smaller meals, even if you have to eat more often. Dont lie down right after you eat. Wait a few hours for your stomach to empty. Avoid tight belts and tight-fitting clothes. Lose excess weight.  Tobacco and Alcohol Avoid smoking tobacco and drinking alcohol. They can make GERD symptoms worse.  Reducing Pollen Exposure The American Academy of Allergy, Asthma and Immunology suggests the following steps to reduce your exposure to pollen during allergy seasons. Do not hang sheets or clothing out to dry; pollen may collect on these items. Do not mow lawns or spend time around freshly cut grass; mowing stirs up pollen. Keep  windows closed at night.  Keep car windows closed while driving. Minimize morning activities outdoors, a time when pollen counts are usually at their highest. Stay indoors as much as possible when pollen counts or humidity is high and on windy days when pollen tends to remain in the air longer. Use air conditioning when  possible.  Many air conditioners have filters that trap the pollen spores. Use a HEPA room air filter to remove pollen form the indoor air you breathe.   Control of Dust Mite Allergen Dust mites play a major role in allergic asthma and rhinitis. They occur in environments with high humidity wherever human skin is found. Dust mites absorb humidity from the atmosphere (ie, they do not drink) and feed on organic matter (including shed human and animal skin). Dust mites are a microscopic type of insect that you cannot see with the naked eye. High levels of dust mites have been detected from mattresses, pillows, carpets, upholstered furniture, bed covers, clothes, soft toys and any woven material. The principal allergen of the dust mite is found in its feces. A gram of dust may contain 1,000 mites and 250,000 fecal particles. Mite antigen is easily measured in the air during house cleaning activities. Dust mites do not bite and do not cause harm to humans, other than by triggering allergies/asthma.  Ways to decrease your exposure to dust mites in your home:  1. Encase mattresses, box springs and pillows with a mite-impermeable barrier or cover  2. Wash sheets, blankets and drapes weekly in hot water (130 F) with detergent and dry them in a dryer on the hot setting.  3. Have the room cleaned frequently with a vacuum cleaner and a damp dust-mop. For carpeting or rugs, vacuuming with a vacuum cleaner equipped with a high-efficiency particulate air (HEPA) filter. The dust mite allergic individual should not be in a room which is being cleaned and should wait 1 hour after cleaning before going into the room.  4. Do not sleep on upholstered furniture (eg, couches).  5. If possible removing carpeting, upholstered furniture and drapery from the home is ideal. Horizontal blinds should be eliminated in the rooms where the person spends the most time (bedroom, study, television room). Washable vinyl, roller-type  shades are optimal.  6. Remove all non-washable stuffed toys from the bedroom. Wash stuffed toys weekly like sheets and blankets above.  7. Reduce indoor humidity to less than 50%. Inexpensive humidity monitors can be purchased at most hardware stores. Do not use a humidifier as can make the problem worse and are not recommended.  Control of Dog or Cat Allergen Avoidance is the best way to manage a dog or cat allergy. If you have a dog or cat and are allergic to dog or cats, consider removing the dog or cat from the home. If you have a dog or cat but dont want to find it a new home, or if your family wants a pet even though someone in the household is allergic, here are some strategies that may help keep symptoms at bay:  Keep the pet out of your bedroom and restrict it to only a few rooms. Be advised that keeping the dog or cat in only one room will not limit the allergens to that room. Dont pet, hug or kiss the dog or cat; if you do, wash your hands with soap and water. High-efficiency particulate air (HEPA) cleaners run continuously in a bedroom or living room can reduce allergen levels over time.  Regular use of a high-efficiency vacuum cleaner or a central vacuum can reduce allergen levels. Giving your dog or cat a bath at least once a week can reduce airborne allergen.  Control of Cockroach Allergen Cockroach allergen has been identified as an important cause of acute attacks of asthma, especially in urban settings.  There are fifty-five species of cockroach that exist in the [place], however only three, the [place], [place] species produce allergen that can affect patients with Asthma.  Allergens can be obtained from fecal particles, egg casings and secretions from cockroaches.    Remove food sources. Reduce access to water. Seal access and entry points. Spray runways with 0.5-1% Diazinon or Chlorpyrifos Blow boric acid power under stoves and refrigerator. Place  bait stations (hydramethylnon) at feeding sites.

## 2021-03-15 NOTE — Progress Notes (Signed)
Katrina Vega Dept: [phone]  FOLLOW UP NOTE  Patient ID: Katrina Vega, female    DOB: [birth year]  Age: 66 y.o. MRN: [id number] Date of Office Visit: 03/15/2021  Assessment  Chief Complaint: Allergic Rhinitis , Other (Smelling cigarette smoke through the vents and it has been affecting her breathing.  She spoke to her neighbor and he said he would cut back. She needs a note to give to her apartment. ), and Eczema (Scalp itching - no rash or bumps )  HPI Katrina Vega is a 66 year old female who presents to the clinic for follow-up visit.  She was last seen in this clinic ong 09/24/2020 by Dr. Maudie Mercury for evaluation of asthma/COPD overlap syndrome, allergic rhinitis, allergic conjunctivitis, and reflux.  At today's visit, she reports her asthma has been poorly controlled over the last year, however, symptoms have increased over the last 2 weeks.  She reports shortness of breath with rest, talking, and activity and cough which is occasionally dry and occasionally producing clear mucus.  She reports the symptoms occur at night when her upstairs neighbor begins to smoke in the smoke dressed through the ventilation system to her bedroom.  She continues Breztri 2 puffs twice a day with a spacer and is using albuterol about 4-5 times a week with moderate relief of symptoms.  Allergic rhinitis is reported as moderately well controlled with symptoms including clear rhinorrhea, sneezing, and frequent postnasal drainage.  She continues an over-the-counter antihistamine daily, Nasacort, and is not currently using a nasal saline rinse.  Allergic conjunctivitis is reported as moderately well controlled with eye swelling occurring 1 time last week.  She continues cromolyn eyedrops as needed.  Reflux is reported as well controlled with lifestyle modifications as well as Protonix 40 mg twice a day as previously prescribed.  Her current medications are listed in the chart. Of note, chart  review from 01/31/2021 indicates absolute eosinophils 0.2.   Drug Allergies:  Allergies  Allergen Reactions   Contrast Media [Iodinated Contrast Media] Shortness Of Breath and Other (See Comments)    sob, chest tight   Morphine Itching   Statins Diarrhea and Other (See Comments)    nausea, diarrhea, myalgias    Physical Exam: BP 134/88    Pulse 88    Temp 97.9 F (36.6 C)    Resp 16    Ht 5\' 5"  (1.651 m)    Wt 250 lb 12.8 oz (113.8 kg)    SpO2 96%    BMI 41.74 kg/m    Physical Exam Vitals reviewed.  Constitutional:      Appearance: Normal appearance.  HENT:     Head: Normocephalic and atraumatic.     Right Ear: Tympanic membrane normal.     Left Ear: Tympanic membrane normal.     Nose:     Comments: Bilateral nares slightly erythematous with clear nasal drainage.  Pharynx normal.  Ears normal.  Eyes normal.    Mouth/Throat:     Pharynx: Oropharynx is clear.  Eyes:     Conjunctiva/sclera: Conjunctivae normal.  Cardiovascular:     Rate and Rhythm: Normal rate and regular rhythm.     Heart sounds: Normal heart sounds. No murmur heard. Pulmonary:     Effort: Pulmonary effort is normal.     Breath sounds: Normal breath sounds.     Comments: Lungs clear to auscultation Musculoskeletal:        General: Normal range of motion.  Cervical back: Normal range of motion and neck supple.  Skin:    General: Skin is warm and dry.  Neurological:     Mental Status: She is alert and oriented to person, place, and time.  Psychiatric:        Mood and Affect: Mood normal.        Behavior: Behavior normal.        Thought Content: Thought content normal.        Judgment: Judgment normal.    Diagnostics: FVC 2.03, FEV1 1.80.  Predicted FVC 2.71, predicted FEV1 2.11.  Spirometry indicates mild restriction.  Postbronchodilator FVC 2.01, FEV1 1.75.  Postbronchodilator spirometry indicates mild restriction with no significant bronchodilator response.  This is consistent with previous  spirometry readings.  Assessment and Plan: 1. Not well controlled moderate persistent asthma   2. Asthma-COPD overlap syndrome ([place])   3. Seasonal and perennial allergic rhinitis   4. Allergic conjunctivitis of both eyes   5. Gastroesophageal reflux disease, unspecified whether esophagitis present     Meds ordered this encounter  Medications   albuterol (VENTOLIN HFA) 108 (90 Base) MCG/ACT inhaler    Sig: Inhale 2 puffs into the lungs every 6 (six) hours as needed for wheezing.    Dispense:  18 g    Refill:  1   Budeson-Glycopyrrol-Formoterol (BREZTRI AEROSPHERE) 160-9-4.8 MCG/ACT AERO    Sig: Inhale 2 puffs into the lungs in the morning and at bedtime.    Dispense:  10.7 g    Refill:  5   cromolyn (OPTICROM) 4 % ophthalmic solution    Sig: Use 1 drop in each eye up to 4 times a day as needed for red, itchy, or watery eyes    Dispense:  30 mL    Refill:  5   montelukast (SINGULAIR) 10 MG tablet    Sig: Take 1 tablet (10 mg total) by mouth at bedtime.    Dispense:  90 tablet    Refill:  1   albuterol (PROVENTIL) (2.5 MG/3ML) 0.083% nebulizer solution    Sig: Take 3 mLs (2.5 mg total) by nebulization every 6 (six) hours as needed for wheezing or shortness of breath.    Dispense:  75 mL    Refill:  2    Patient Instructions  Asthma Begin montelukast 10 mg once a day to prevent cough or wheeze Continue Breztri 2 puffs twice a day with a spacer to prevent cough or wheeze Continue albuterol 2 puffs every 4 hours as needed for cough or wheeze OR Instead use albuterol 0.083% solution via nebulizer one unit vial every 4 hours as needed for cough or wheeze Continue to avoid cigarette smoke Information provided about Fasenra injections.  Allergic rhinitis Continue allergen avoidance measures directed toward dust mite, cockroach, weed pollen, cat, and dog Continue allergen immunotherapy and have access to an epinephrine autoinjector set Continue Nasacort 1 to 2 sprays in each nostril  once a day as needed for nasal congestion. In the right nostril, point the applicator out toward the right ear. In the left nostril, point the applicator out toward the left ear Continue azelastine 2 sprays in each nostril up to twice a day as needed for runny nose Consider saline nasal rinses as needed for nasal symptoms. Use this before any medicated nasal sprays for best result Continue an antihistamine once a day as needed for runny nose or itch. Remember to rotate to a different antihistamine about every 3 months. Some examples of over the counter  antihistamines include Zyrtec (cetirizine), Xyzal (levocetirizine), Allegra (fexofenadine), and Claritin (loratidine).   Allergic conjunctivitis Continue cromolyn 4% eyedrops.  Use 1 drop in each eye up to 4 times a day as needed for red, itchy, or watery eyes  Reflux Continue dietary and lifestyle modifications as listed below Continue pantoprazole as previously prescribed  Call the clinic if this treatment plan is not working well for you.  Follow up in 2 months or sooner if needed.   Return in about 2 months (around 05/13/2021), or if symptoms worsen or fail to improve.    Thank you for the opportunity to care for this patient.  Please do not hesitate to contact me with questions.  Gareth Morgan, FNP [facility] [place] [facility] [place]

## 2021-03-17 DIAGNOSIS — E119 Type 2 diabetes mellitus without complications: Secondary | ICD-10-CM | POA: Diagnosis not present

## 2021-03-25 ENCOUNTER — Other Ambulatory Visit: Payer: Self-pay | Admitting: Cardiology

## 2021-03-25 DIAGNOSIS — I5032 Chronic diastolic (congestive) heart failure: Secondary | ICD-10-CM

## 2021-03-26 ENCOUNTER — Other Ambulatory Visit: Payer: Self-pay

## 2021-03-26 ENCOUNTER — Inpatient Hospital Stay: Payer: Medicare Other | Attending: Hematology & Oncology

## 2021-03-26 ENCOUNTER — Ambulatory Visit (INDEPENDENT_AMBULATORY_CARE_PROVIDER_SITE_OTHER): Payer: Medicare Other | Admitting: *Deleted

## 2021-03-26 DIAGNOSIS — D509 Iron deficiency anemia, unspecified: Secondary | ICD-10-CM | POA: Diagnosis not present

## 2021-03-26 DIAGNOSIS — J309 Allergic rhinitis, unspecified: Secondary | ICD-10-CM

## 2021-03-26 LAB — RETICULOCYTES
Immature Retic Fract: 11.2 % (ref 2.3–15.9)
RBC.: 4.72 MIL/uL (ref 3.87–5.11)
Retic Count, Absolute: 67.5 10*3/uL (ref 19.0–186.0)
Retic Ct Pct: 1.4 % (ref 0.4–3.1)

## 2021-03-26 LAB — CBC WITH DIFFERENTIAL (CANCER CENTER ONLY)
Abs Immature Granulocytes: 0.05 10*3/uL (ref 0.00–0.07)
Basophils Absolute: 0 10*3/uL (ref 0.0–0.1)
Basophils Relative: 1 %
Eosinophils Absolute: 0.2 10*3/uL (ref 0.0–0.5)
Eosinophils Relative: 3 %
HCT: 39.4 % (ref 36.0–46.0)
Hemoglobin: 12.3 g/dL (ref 12.0–15.0)
Immature Granulocytes: 1 %
Lymphocytes Relative: 37 %
Lymphs Abs: 2.1 10*3/uL (ref 0.7–4.0)
MCH: 26.2 pg (ref 26.0–34.0)
MCHC: 31.2 g/dL (ref 30.0–36.0)
MCV: 84 fL (ref 80.0–100.0)
Monocytes Absolute: 0.5 10*3/uL (ref 0.1–1.0)
Monocytes Relative: 9 %
Neutro Abs: 2.9 10*3/uL (ref 1.7–7.7)
Neutrophils Relative %: 49 %
Platelet Count: 272 10*3/uL (ref 150–400)
RBC: 4.69 MIL/uL (ref 3.87–5.11)
RDW: 16.3 % — ABNORMAL HIGH (ref 11.5–15.5)
WBC Count: 5.7 10*3/uL (ref 4.0–10.5)
nRBC: 0 % (ref 0.0–0.2)

## 2021-03-26 LAB — IRON AND IRON BINDING CAPACITY (CC-WL,HP ONLY)
Iron: 56 ug/dL (ref 28–170)
Saturation Ratios: 13 % (ref 10.4–31.8)
TIBC: 442 ug/dL (ref 250–450)
UIBC: 386 ug/dL (ref 148–442)

## 2021-03-26 LAB — FERRITIN: Ferritin: 24 ng/mL (ref 11–307)

## 2021-03-29 DIAGNOSIS — Z794 Long term (current) use of insulin: Secondary | ICD-10-CM | POA: Diagnosis not present

## 2021-03-29 DIAGNOSIS — Z7984 Long term (current) use of oral hypoglycemic drugs: Secondary | ICD-10-CM | POA: Diagnosis not present

## 2021-03-29 DIAGNOSIS — E119 Type 2 diabetes mellitus without complications: Secondary | ICD-10-CM | POA: Diagnosis not present

## 2021-03-29 DIAGNOSIS — Z7985 Long-term (current) use of injectable non-insulin antidiabetic drugs: Secondary | ICD-10-CM | POA: Diagnosis not present

## 2021-04-03 ENCOUNTER — Other Ambulatory Visit: Payer: Self-pay

## 2021-04-03 MED ORDER — MONTELUKAST SODIUM 10 MG PO TABS: 10.0000 mg | ORAL_TABLET | Freq: Every day | ORAL | 0 refills | Status: DC

## 2021-04-03 MED ORDER — ALBUTEROL SULFATE HFA 108 (90 BASE) MCG/ACT IN AERS: 2.0000 | INHALATION_SPRAY | Freq: Four times a day (QID) | RESPIRATORY_TRACT | 1 refills | Status: DC | PRN

## 2021-04-05 ENCOUNTER — Other Ambulatory Visit: Payer: Self-pay

## 2021-04-08 ENCOUNTER — Other Ambulatory Visit: Payer: Self-pay

## 2021-04-09 ENCOUNTER — Telehealth: Payer: Self-pay | Admitting: Family Medicine

## 2021-04-09 ENCOUNTER — Ambulatory Visit (INDEPENDENT_AMBULATORY_CARE_PROVIDER_SITE_OTHER): Payer: Medicare Other | Admitting: *Deleted

## 2021-04-09 DIAGNOSIS — J309 Allergic rhinitis, unspecified: Secondary | ICD-10-CM

## 2021-04-09 NOTE — Telephone Encounter (Signed)
Letter has been generated and placed up front for pickup in the [employer] office for pickup. Called and left a voicemail asking for patient to return call to inform.

## 2021-04-09 NOTE — Telephone Encounter (Signed)
Can you please write this up on our letterhead and print for the patient. Please let them know when it is ready and give her a choice to pick the letter up or receive be mail. Thank you!!  To whom it ma concern: Katrina Vega is a patient at the [facility] where she is treated for moderate persistent asthma. Her symptoms of asthma have increased while being exposed to cigarette smoke in her apartment. It would be of benefit to her health, specifically asthma, to eliminate her cigarette smoke exposure. Please call the clinic with any questions. Sincerely,   Gareth Morgan, FNP

## 2021-04-09 NOTE — Telephone Encounter (Signed)
Katrina Vega came by the office today and states that she was supposed to receive a letter form you for her landlord.  Katrina Vega states it was supposed to mention her moving because of cigarette smoke coming through the vents and her having COPD.  She states the letter was supposed to be emailed to her but she never received anything.  Please advise.

## 2021-04-10 NOTE — Telephone Encounter (Signed)
Called and informed patient that letter is available for pick up in the [place] office. Patient verbalized understanding.

## 2021-04-17 ENCOUNTER — Other Ambulatory Visit: Payer: Self-pay

## 2021-04-17 DIAGNOSIS — E119 Type 2 diabetes mellitus without complications: Secondary | ICD-10-CM | POA: Diagnosis not present

## 2021-04-19 ENCOUNTER — Encounter: Payer: Self-pay | Admitting: Cardiology

## 2021-04-19 ENCOUNTER — Other Ambulatory Visit: Payer: Self-pay

## 2021-04-19 ENCOUNTER — Ambulatory Visit: Payer: Medicare Other | Admitting: Cardiology

## 2021-04-19 VITALS — BP 111/64 | HR 81 | Temp 98.0°F | Resp 16 | Ht 65.0 in | Wt 256.0 lb

## 2021-04-19 DIAGNOSIS — I739 Peripheral vascular disease, unspecified: Secondary | ICD-10-CM

## 2021-04-19 DIAGNOSIS — I129 Hypertensive chronic kidney disease with stage 1 through stage 4 chronic kidney disease, or unspecified chronic kidney disease: Secondary | ICD-10-CM | POA: Diagnosis not present

## 2021-04-19 DIAGNOSIS — Z6841 Body Mass Index (BMI) 40.0 and over, adult: Secondary | ICD-10-CM

## 2021-04-19 DIAGNOSIS — I25118 Atherosclerotic heart disease of native coronary artery with other forms of angina pectoris: Secondary | ICD-10-CM | POA: Diagnosis not present

## 2021-04-19 DIAGNOSIS — Z794 Long term (current) use of insulin: Secondary | ICD-10-CM

## 2021-04-19 DIAGNOSIS — E1122 Type 2 diabetes mellitus with diabetic chronic kidney disease: Secondary | ICD-10-CM | POA: Diagnosis not present

## 2021-04-19 DIAGNOSIS — N1831 Chronic kidney disease, stage 3a: Secondary | ICD-10-CM | POA: Diagnosis not present

## 2021-04-19 DIAGNOSIS — I10 Essential (primary) hypertension: Secondary | ICD-10-CM

## 2021-04-19 MED ORDER — NITROGLYCERIN 0.4 MG SL SUBL: 0.4000 mg | SUBLINGUAL_TABLET | SUBLINGUAL | 3 refills | Status: DC | PRN

## 2021-04-19 NOTE — Progress Notes (Signed)
Primary Physician/Referring:  Marrian Salvage, FNP  Patient ID: Katrina Vega, female    DOB: 1955/05/06, 66 y.o.   MRN: [id number]  Chief Complaint  Patient presents with   PAD   Hypertension   Coronary Artery Disease   Follow-up    6 months    HPI:    Katrina Vega  is a 66 y.o. Female patient with coronary artery disease and history of PCI to the LAD and  ramus in 2008 with implantation of Taxus stents, NSTEMI IN 2013 SP BALLOON ANGIOPLASTY TO SMALL RCA , chronic diastolic heart failure, hyperlipidemia, hypertension, diabetes mellitus with stage III chronic kidney disease, morbid obesity, OSA unable to tolerate CPAP,  peripheral arterial disease with right posterior tibial DES stent placed in 2008 and bilateral SFA stenting sometime in 2016 and atherectomy for ISR in 2019.  This is a 51-monthoffice visit, on her last office visit she had mild symptoms of claudication.  This visit she complains of occasional episodes of chest tightness, states that in the last 2 months or so she has had about 3 episodes.  Chest discomfort occasionally at rest and also sometimes with exertion but described as very mild.  She has not used any sublingual nitroglycerin and thinks that they may be expired.  No PND or orthopnea, no leg edema.     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 Negative for claudication and leg swelling.  Musculoskeletal:  Positive for arthritis and back pain.  Gastrointestinal:  Negative for melena.  Objective  Blood pressure 111/64, pulse 81, temperature 98 F (36.7 C), temperature source Temporal, resp. rate 16, height _0  (1.651 m), weight 256 lb (116.1 kg), SpO2 92 %.  Vitals with BMI 04/19/2021 03/15/2021 02/22/2021  Height _1  _2  -  Weight 256 lbs 250 lbs 13 oz -  BMI 445.0438.88-  Systolic [id number] Diastolic 64 88 65  Pulse 81 88 68     Physical Exam Constitutional:      Appearance: She is morbidly obese.     Comments: Morbidly obese in no acute distress.  Neck:     Vascular: No carotid bruit or JVD.  Cardiovascular:     Rate and Rhythm: Normal rate and regular rhythm.     Pulses:          Carotid pulses are 2+ on the right side and 2+ on the left side.      Radial pulses are 2+ on the right side and 2+ on the left side.       Femoral pulses are 1+ on the right side and 1+ on the left side.      Dorsalis pedis pulses are  0 on the right side and 0 on the left side.       Posterior tibial pulses are 0 on the right side and 0 on the left side.     Heart sounds: Normal heart sounds. No murmur heard.   No gallop.  Pulmonary:     Effort: Pulmonary effort is normal.     Breath sounds: Normal breath sounds.  Abdominal:     General: Bowel sounds are normal.     Palpations: Abdomen is soft.     Comments: Obese. Pannus present  Musculoskeletal:     Right lower leg: No edema.     Left lower leg: No edema.   Laboratory examination:   Lipid Panel     Component Value Date/Time   CHOL 110 01/31/2021 1118   TRIG 66.0 01/31/2021 1118   HDL 36.70 (L) 01/31/2021 1118   CHOLHDL 3 01/31/2021 1118   VLDL 13.2 01/31/2021 1118   LDLCALC 60 01/31/2021 1118   HEMOGLOBIN A1C Lab Results  Component Value Date   HGBA1C 7.2 12/04/2020    External labs:   Labs 03/06/2021:  A1c 7.7%.  Urinary protein 100 mg/dL.  Labs  10/10/2020:  Sodium 141, potassium 4.4, BUN 25, creatinine 1.33, EGFR 44 mL.  Vitamin D 08/02/2020: Markedly reduced at 15.  Medications and allergies   Allergies  Allergen Reactions   Contrast Media [Iodinated Contrast Media] Shortness Of Breath and Other (See Comments)    sob, chest tight   Morphine Itching   Statins Diarrhea and Other (See Comments)    nausea, diarrhea, myalgias     Current Outpatient Medications:    albuterol (PROVENTIL) (2.5 MG/3ML) 0.083% nebulizer solution, Take 3 mLs (2.5 mg total) by nebulization every 6 (six) hours as needed for wheezing or shortness of breath., Disp: 75 mL, Rfl: 2   albuterol (VENTOLIN HFA) 108 (90 Base) MCG/ACT inhaler, Inhale 2 puffs into the lungs every 6 (six) hours as needed for wheezing., Disp: 18 g, Rfl: 1   azelastine (ASTELIN) 0.1 % nasal spray, 1-2 sprays per nostril 2 times daily as needed for runny nose., Disp: 90 mL, Rfl: 2   BD AUTOSHIELD DUO 30G X 5 MM MISC, 1 KIT BY MISC ROUTE 3 TIMES DAILY BEFORE MEALS, Disp: , Rfl:    Budeson-Glycopyrrol-Formoterol (BREZTRI AEROSPHERE) 160-9-4.8 MCG/ACT AERO, Inhale 2 puffs into the lungs in the morning and at bedtime., Disp: 10.7 g, Rfl: 5   cholecalciferol (VITAMIN D3) 25 MCG (1000 UNIT) tablet, Take 1,000 Units by mouth daily., Disp: , Rfl:    clopidogrel (PLAVIX) 75 MG tablet, TAKE 1 TABLET BY MOUTH  DAILY, Disp: 90 tablet, Rfl: 0   Continuous Blood Gluc Sensor (DEXCOM G6 SENSOR) MISC, USE 1 SENSOR AS NEEDED CHANGE EVERY 10 DAYS, Disp: , Rfl:    cromolyn (OPTICROM) 4 % ophthalmic solution, Use 1 drop in each eye up to 4 times a day as needed for red, itchy, or watery eyes, Disp: 30 mL, Rfl: 5   empagliflozin (JARDIANCE) 10 MG TABS tablet, Take 10 mg by mouth daily., Disp: , Rfl:    EPINEPHrine (EPIPEN 2-PAK) 0.3 mg/0.3 mL IJ SOAJ injection, Use as directed for severe allergic reactions, Disp: 2 each, Rfl: 3   gabapentin (NEURONTIN) 300 MG capsule, Take 1 capsule (300 mg total) by mouth 3  (three) times daily., Disp: 270 capsule, Rfl: 1   glucagon 1 MG injection, Inject 1 mg into the muscle once as needed (low blood sugar)., Disp: , Rfl:  glucose 4 GM chewable tablet, Chew 1 tablet by mouth once as needed for low blood sugar., Disp: , Rfl:    HUMULIN N 100 UNIT/ML injection, Inject 60 Units into the skin 3 (three) times daily before meals., Disp: , Rfl:    isosorbide mononitrate (IMDUR) 60 MG 24 hr tablet, TAKE 1 TABLET BY MOUTH  DAILY, Disp: 90 tablet, Rfl: 3   levocetirizine (XYZAL) 5 MG tablet, Take 1 tablet (5 mg total) by mouth every evening., Disp: 90 tablet, Rfl: 2   lisinopril (ZESTRIL) 20 MG tablet, Take 1 tablet (20 mg total) by mouth daily., Disp: 90 tablet, Rfl: 3   metoprolol tartrate (LOPRESSOR) 50 MG tablet, TAKE 1 TABLET BY MOUTH  EVERY 12 HOURS, Disp: 90 tablet, Rfl: 7   pantoprazole (PROTONIX) 40 MG tablet, TAKE 1 TABLET(40 MG) BY MOUTH IN THE MORNING AND AT BEDTIME, Disp: 180 tablet, Rfl: 0   psyllium (METAMUCIL) 58.6 % packet, Take 1 packet by mouth daily as needed (regularity)., Disp: , Rfl:    Respiratory Therapy Supplies (CARETOUCH 2 CPAP HOSE HANGER) MISC, by Does not apply route., Disp: , Rfl:    rosuvastatin (CRESTOR) 40 MG tablet, Take 1 tablet (40 mg total) by mouth at bedtime., Disp: 30 tablet, Rfl: 3   Semaglutide, 1 MG/DOSE, (OZEMPIC, 1 MG/DOSE,) 4 MG/3ML SOPN, Inject 2 mg into the skin every Sunday., Disp: , Rfl:    torsemide (DEMADEX) 20 MG tablet, TAKE 1 TABLET (20 MG TOTAL) BY  MOUTH DAILY AS NEEDED (FLUID  BUILD UP)., Disp: 90 tablet, Rfl: 3   nitroGLYCERIN (NITROSTAT) 0.4 MG SL tablet, Place 1 tablet (0.4 mg total) under the tongue every 5 (five) minutes as needed for chest pain., Disp: 25 tablet, Rfl: 3    Radiology:   No results found.  Cardiac Studies:   Coronary angioplasty: -RCA PCI (Dr. Tonye Becket) 10/2011 for NSTEMI; LAD/[place]amus DES 2008 Taxus;  Coronary angiogram 12/22/2017: A- Left Main: Mild plaquing, no critical lesions seen.  B-  LAD: The LAD contains mild plaquing proximally and gives rise to a large high lying 1st diagonal branch in which a 40% ostial stenosis is present. Mid LAD contains a well deployed stent which is patent (2008 Taxus), no evidence for significant restenoses. The distal LAD is very large and free of significant disease.  C- Left Circumflex: Limited views of the left circumflex were obtained due to dye issues. There appears to be a proximal left circumflex and 1st obtuse marginal stented segments which are patent (Taxus 2008) and show no it evidence for significant restenoses.  D- RCA: The RCA is small and contains a patent proximal stent with 50 to 60% mid stenosis and mild plaquing noted distally.  PTCA site from 2013 is patent.  Peripheral arteriogram 12/22/2017:  There are bilateral SFA stents present with evidence for severe right and mild left in stent restenoses present. The bilateral popliteal arteries and proximal trifurcation vessels are patent. However, there may be moderate to severe lesion involving the mid right anterior tibial artery in particular. In addition, there was poor flow noted in the left anterior tibial artery with 2 vessel runoff probably present bilaterally. Laser atherectomy of right SFA followed by DCB with 5.0 x 120 mm impact.  Echocardiogram 07/04/2019: Normal LV size, mild LVH, hyperdynamic LVEF at >65% with grade 1 diastolic dysfunction.  Otherwise no other significant abnormality.  Poor quality due to patient body habitus.  Lexiscan nuclear stress test 07/07/2019: Nondiagnostic EKG.  Dyspnea during stress test. No evidence  of ischemia.  Soft tissue attenuation noted.  LVEF 72%.  Normal wall motion.  Low risk study.  No significant change from 12/20/2017.  Lower extremity venous duplex 07/02/2019: No evidence of DVT in bilateral lower extremity.  Lower Extremity Arterial Duplex 10/09/2020: Right mid and distal SFA occluded, dampened flow in the popliteal artery via  collaterals. Moderate velocity increase at the right profunda femoral artery.  No hemodynamically significant stenoses are identified in the left lower extremity arterial system. This exam reveals normal perfusion of the right lower extremity (ABI 0.97) with monophasic waveform in the PT This exam reveals mildly decreased perfusion of the left lower extremity, noted at the post tibial artery level (ABI 0.90) with monophasic waveform in the left AT.  Compared to 12/16/2019, right SFA stenosis is new.  No significant change in ABI.  Abdominal Aortic Duplex 10/09/2020: The maximum aorta (sac) diameter is 2.51 cm (prox) with mild ectasia in the proximal aorta. Diffuse plaque observed in the proximal, mid and distal aorta.  Bilateral CIA are not well visualized but appear to be severely stenosed with diffuse plaque, however, the flow velocity appears normal.  Clinical correlation recommended. Consider CTA or catheter directed angiography if clinically indicated.   EKG:   EKG 04/19/2021: Normal sinus rhythm with rate of 76 bpm, normal axis, no evidence of ischemia.  No significant change from 10/12/2020.  Assessment     ICD-10-CM   1. Coronary artery disease of native artery of native heart with stable angina pectoris (HCC)  I25.118 EKG 12-Lead    nitroGLYCERIN (NITROSTAT) 0.4 MG SL tablet    2. PAD (peripheral artery disease) (HCC)  I73.9     3. Primary hypertension  I10     4. Type 2 diabetes mellitus with stage 3a chronic kidney disease, with long-term current use of insulin (HCC)  E11.22 Ambulatory referral to Nutrition and Diabetic Education   N18.31    Z79.4     5. Class 3 severe obesity due to excess calories with serious comorbidity and body mass index (BMI) of 40.0 to 44.9 in adult [facility])  E66.01 Ambulatory referral to Nutrition and Diabetic Education   Z68.41       Meds ordered this encounter  Medications   nitroGLYCERIN (NITROSTAT) 0.4 MG SL tablet    Sig: Place 1 tablet (0.4  mg total) under the tongue every 5 (five) minutes as needed for chest pain.    Dispense:  25 tablet    Refill:  3    Orders Placed This Encounter  Procedures   Ambulatory referral to Nutrition and Diabetic Education    Referral Priority:   Routine    Referral Type:   Consultation    Referral Reason:   Specialty Services Required    Number of Visits Requested:   1   EKG 12-Lead   Recommendations:   Katrina Vega is a 66 y.o. Female patient with coronary artery disease and history of PCI to the LAD and  ramus in 2008 with implantation of Taxus stents, NSTEMI IN 2013 SP BALLOON ANGIOPLASTY TO SMALL RCA , chronic diastolic heart failure, hyperlipidemia, hypertension, diabetes mellitus with stage III chronic kidney disease, morbid obesity, OSA unable to tolerate CPAP,  peripheral arterial disease with right posterior tibial DES stent placed in 2008 and bilateral SFA stenting sometime in 2016 and atherectomy for ISR in 2019.  She presents for a 28-monthoffice visit.  She has had occasional episodes of chest tightness.  Advised her to use abdominal nitroglycerin,  advised that if she has increased frequency of chest discomfort with exertion activity to contact us immediately.  Blood pressures well controlled, external labs reviewed, lipids also under excellent control.  Her main issue continues to be mildly uncontrolled diabetes mellitus with stage IIIa chronic kidney disease along with morbid obesity.  I will refer her to nutrition and diabetes management/wellness clinic.  With regard to peripheral arterial disease, she has clinically severe peripheral arterial disease with absent pedal pulses however remains asymptomatic.  Capillary refill <2 seconds.  Evidence continue medical management for now, presently on Plavix.  CBC has remained stable although she receives iron tablets.  She was also found to have severe vitamin D deficiency and is currently on supplements as well.  I will see her back in 6  months for close monitoring of PAD and also CAD, will continue to monitor her weight as well.  Today there is no clinical evidence of acute decompensated heart failure.  40-minute office visit encounter.    Adrian Prows, MD, [facility] 04/19/2021, 9:56 AM Office: [phone]

## 2021-04-22 DIAGNOSIS — N182 Chronic kidney disease, stage 2 (mild): Secondary | ICD-10-CM | POA: Diagnosis not present

## 2021-04-22 DIAGNOSIS — Z794 Long term (current) use of insulin: Secondary | ICD-10-CM | POA: Diagnosis not present

## 2021-04-22 DIAGNOSIS — E1122 Type 2 diabetes mellitus with diabetic chronic kidney disease: Secondary | ICD-10-CM | POA: Diagnosis not present

## 2021-04-25 LAB — HEMOGLOBIN A1C: Hemoglobin A1C: 7.2

## 2021-05-03 ENCOUNTER — Other Ambulatory Visit: Payer: Self-pay | Admitting: Cardiology

## 2021-05-13 ENCOUNTER — Other Ambulatory Visit: Payer: Medicare Other

## 2021-05-13 ENCOUNTER — Ambulatory Visit: Payer: Medicare Other | Admitting: Family

## 2021-05-15 ENCOUNTER — Ambulatory Visit (INDEPENDENT_AMBULATORY_CARE_PROVIDER_SITE_OTHER): Payer: Medicare Other

## 2021-05-15 DIAGNOSIS — E1165 Type 2 diabetes mellitus with hyperglycemia: Secondary | ICD-10-CM | POA: Diagnosis not present

## 2021-05-15 DIAGNOSIS — E119 Type 2 diabetes mellitus without complications: Secondary | ICD-10-CM | POA: Diagnosis not present

## 2021-05-15 DIAGNOSIS — Z9641 Presence of insulin pump (external) (internal): Secondary | ICD-10-CM | POA: Diagnosis not present

## 2021-05-15 DIAGNOSIS — Z4681 Encounter for fitting and adjustment of insulin pump: Secondary | ICD-10-CM | POA: Diagnosis not present

## 2021-05-15 DIAGNOSIS — J309 Allergic rhinitis, unspecified: Secondary | ICD-10-CM

## 2021-05-15 DIAGNOSIS — Z794 Long term (current) use of insulin: Secondary | ICD-10-CM | POA: Diagnosis not present

## 2021-05-16 ENCOUNTER — Ambulatory Visit: Payer: Medicare Other | Admitting: Allergy

## 2021-05-17 ENCOUNTER — Encounter: Payer: Self-pay | Admitting: Family

## 2021-05-17 ENCOUNTER — Ambulatory Visit: Payer: Medicare Other | Admitting: Allergy

## 2021-05-22 ENCOUNTER — Inpatient Hospital Stay (HOSPITAL_BASED_OUTPATIENT_CLINIC_OR_DEPARTMENT_OTHER): Payer: Medicare Other | Admitting: Family

## 2021-05-22 ENCOUNTER — Encounter: Payer: Self-pay | Admitting: Family

## 2021-05-22 ENCOUNTER — Inpatient Hospital Stay: Payer: Medicare Other | Attending: Hematology & Oncology

## 2021-05-22 ENCOUNTER — Telehealth: Payer: Self-pay | Admitting: *Deleted

## 2021-05-22 VITALS — BP 104/72 | HR 78 | Temp 98.6°F | Resp 18 | Wt 249.8 lb

## 2021-05-22 DIAGNOSIS — D509 Iron deficiency anemia, unspecified: Secondary | ICD-10-CM | POA: Insufficient documentation

## 2021-05-22 LAB — CBC WITH DIFFERENTIAL (CANCER CENTER ONLY)
Abs Immature Granulocytes: 0.03 10*3/uL (ref 0.00–0.07)
Basophils Absolute: 0.1 10*3/uL (ref 0.0–0.1)
Basophils Relative: 1 %
Eosinophils Absolute: 0.1 10*3/uL (ref 0.0–0.5)
Eosinophils Relative: 2 %
HCT: 39.7 % (ref 36.0–46.0)
Hemoglobin: 12.7 g/dL (ref 12.0–15.0)
Immature Granulocytes: 1 %
Lymphocytes Relative: 38 %
Lymphs Abs: 2.2 10*3/uL (ref 0.7–4.0)
MCH: 26.3 pg (ref 26.0–34.0)
MCHC: 32 g/dL (ref 30.0–36.0)
MCV: 82.2 fL (ref 80.0–100.0)
Monocytes Absolute: 0.4 10*3/uL (ref 0.1–1.0)
Monocytes Relative: 7 %
Neutro Abs: 3.1 10*3/uL (ref 1.7–7.7)
Neutrophils Relative %: 51 %
Platelet Count: 274 10*3/uL (ref 150–400)
RBC: 4.83 MIL/uL (ref 3.87–5.11)
RDW: 15.2 % (ref 11.5–15.5)
WBC Count: 5.9 10*3/uL (ref 4.0–10.5)
nRBC: 0 % (ref 0.0–0.2)

## 2021-05-22 LAB — RETICULOCYTES
Immature Retic Fract: 15.6 % (ref 2.3–15.9)
RBC.: 4.84 MIL/uL (ref 3.87–5.11)
Retic Count, Absolute: 73.1 10*3/uL (ref 19.0–186.0)
Retic Ct Pct: 1.5 % (ref 0.4–3.1)

## 2021-05-22 NOTE — Telephone Encounter (Signed)
Per 05/22/21 los -gave upcoming appointments - confirmed ?

## 2021-05-22 NOTE — Progress Notes (Signed)
?Hematology and Oncology Follow Up Visit ? ?LEAR CARSTENS ?[id number] ?[birth year] 66 y.o. ?05/22/2021 ? ? ?Principle Diagnosis:  ?Iron deficiency anemia  ? ?Current Therapy:   ?IV iron as indicated ?  ?Interim History:  Ms. Rockers is here today for follow-up. She is doing well and has no complaints at this time.  ?No blood loss noted. No bruising or petechiae.  ?No fever, chills, n/v, cough, rash, dizziness, SOB, chest pain, palpitations, abdominal pain or changes in bowel or bladder habits.  ?No swelling, tenderness, numbness or tingling in her extremities.  ?No falls or syncope.  ?She has been eating well and staying properly hydrated throughout the day. Her weight is stable at 249 lbs.  ? ?ECOG Performance Status: 1 - Symptomatic but completely ambulatory ? ?Medications:  ?Allergies as of 05/22/2021   ? ?   Reactions  ? Contrast Media [iodinated Contrast Media] Shortness Of Breath, Other (See Comments)  ? sob, chest tight  ? Morphine Itching  ? Statins Diarrhea, Other (See Comments)  ? nausea, diarrhea, myalgias  ? ?  ? ?  ?Medication List  ?  ? ?  ? Accurate as of May 22, 2021  2:43 PM. If you have any questions, ask your nurse or doctor.  ?  ?  ? ?  ? ?albuterol (2.5 MG/3ML) 0.083% nebulizer solution ?Commonly known as: PROVENTIL ?Take 3 mLs (2.5 mg total) by nebulization every 6 (six) hours as needed for wheezing or shortness of breath. ?  ?albuterol 108 (90 Base) MCG/ACT inhaler ?Commonly known as: VENTOLIN HFA ?Inhale 2 puffs into the lungs every 6 (six) hours as needed for wheezing. ?  ?azelastine 0.1 % nasal spray ?Commonly known as: ASTELIN ?1-2 sprays per nostril 2 times daily as needed for runny nose. ?  ?BD AutoShield Duo 30G X 5 MM Misc ?Generic drug: Insulin Pen Needle ?1 KIT BY MISC ROUTE 3 TIMES DAILY BEFORE MEALS ?  ?Breztri Aerosphere 160-9-4.8 MCG/ACT Aero ?Generic drug: Budeson-Glycopyrrol-Formoterol ?Inhale 2 puffs into the lungs in the morning and at bedtime. ?  ?CareTouch 2 CPAP Hose Hanger  Misc ?by Does not apply route. ?  ?cholecalciferol 25 MCG (1000 UNIT) tablet ?Commonly known as: VITAMIN D3 ?Take 1,000 Units by mouth daily. ?  ?clopidogrel 75 MG tablet ?Commonly known as: PLAVIX ?TAKE 1 TABLET BY MOUTH DAILY ?  ?cromolyn 4 % ophthalmic solution ?Commonly known as: OPTICROM ?Use 1 drop in each eye up to 4 times a day as needed for red, itchy, or watery eyes ?  ?Dexcom G6 Sensor Misc ?USE 1 SENSOR AS NEEDED CHANGE EVERY 10 DAYS ?  ?empagliflozin 10 MG Tabs tablet ?Commonly known as: JARDIANCE ?Take 10 mg by mouth daily. ?  ?EPINEPHrine 0.3 mg/0.3 mL Soaj injection ?Commonly known as: EpiPen 2-Pak ?Use as directed for severe allergic reactions ?  ?gabapentin 300 MG capsule ?Commonly known as: NEURONTIN ?Take 1 capsule (300 mg total) by mouth 3 (three) times daily. ?  ?glucagon 1 MG injection ?Inject 1 mg into the muscle once as needed (low blood sugar). ?  ?glucose 4 GM chewable tablet ?Chew 1 tablet by mouth once as needed for low blood sugar. ?  ?HumuLIN N 100 UNIT/ML injection ?Generic drug: insulin NPH Human ?Inject 60 Units into the skin 3 (three) times daily before meals. ?  ?isosorbide mononitrate 60 MG 24 hr tablet ?Commonly known as: IMDUR ?TAKE 1 TABLET BY MOUTH  DAILY ?  ?levocetirizine 5 MG tablet ?Commonly known as: Xyzal ?Take 1 tablet (5  mg total) by mouth every evening. ?  ?lisinopril 20 MG tablet ?Commonly known as: ZESTRIL ?Take 1 tablet (20 mg total) by mouth daily. ?  ?metoprolol tartrate 50 MG tablet ?Commonly known as: LOPRESSOR ?TAKE 1 TABLET BY MOUTH  EVERY 12 HOURS ?  ?nitroGLYCERIN 0.4 MG SL tablet ?Commonly known as: NITROSTAT ?Place 1 tablet (0.4 mg total) under the tongue every 5 (five) minutes as needed for chest pain. ?  ?Ozempic (1 MG/DOSE) 4 MG/3ML Sopn ?Generic drug: Semaglutide (1 MG/DOSE) ?Inject 2 mg into the skin every Sunday. ?  ?pantoprazole 40 MG tablet ?Commonly known as: PROTONIX ?TAKE 1 TABLET(40 MG) BY MOUTH IN THE MORNING AND AT BEDTIME ?  ?psyllium 58.6  % packet ?Commonly known as: METAMUCIL ?Take 1 packet by mouth daily as needed (regularity). ?  ?rosuvastatin 40 MG tablet ?Commonly known as: CRESTOR ?Take 1 tablet (40 mg total) by mouth at bedtime. ?  ?torsemide 20 MG tablet ?Commonly known as: DEMADEX ?TAKE 1 TABLET (20 MG TOTAL) BY  MOUTH DAILY AS NEEDED (FLUID  BUILD UP). ?  ? ?  ? ? ?Allergies:  ?Allergies  ?Allergen Reactions  ? Contrast Media [Iodinated Contrast Media] Shortness Of Breath and Other (See Comments)  ?  sob, chest tight  ? Morphine Itching  ? Statins Diarrhea and Other (See Comments)  ?  nausea, diarrhea, myalgias  ? ? ?Past Medical History, Surgical history, Social history, and Family History were reviewed and updated. ? ?Review of Systems: ?All other 10 point review of systems is negative.  ? ?Physical Exam: ? weight is 249 lb 12.8 oz (113.3 kg). Her oral temperature is 98.6 ?F (37 ?C). Her blood pressure is 104/72 and her pulse is 78. Her respiration is 18 and oxygen saturation is 97%.  ? ?Wt Readings from Last 3 Encounters:  ?05/22/21 249 lb 12.8 oz (113.3 kg)  ?04/19/21 256 lb (116.1 kg)  ?03/15/21 250 lb 12.8 oz (113.8 kg)  ? ? ?Ocular: Sclerae unicteric, pupils equal, round and reactive to light ?Ear-nose-throat: Oropharynx clear, dentition fair ?Lymphatic: No cervical or supraclavicular adenopathy ?Lungs no rales or rhonchi, good excursion bilaterally ?Heart regular rate and rhythm, no murmur appreciated ?Abd soft, nontender, positive bowel sounds ?MSK no focal spinal tenderness, no joint edema ?Neuro: non-focal, well-oriented, appropriate affect ?Breasts: Deferred  ? ?Lab Results  ?Component Value Date  ? WBC 5.9 05/22/2021  ? HGB 12.7 05/22/2021  ? HCT 39.7 05/22/2021  ? MCV 82.2 05/22/2021  ? PLT 274 05/22/2021  ? ?Lab Results  ?Component Value Date  ? FERRITIN 24 03/26/2021  ? IRON 56 03/26/2021  ? TIBC 442 03/26/2021  ? UIBC 386 03/26/2021  ? IRONPCTSAT 13 03/26/2021  ? ?Lab Results  ?Component Value Date  ? RETICCTPCT 1.5  05/22/2021  ? RBC 4.84 05/22/2021  ? ?No results found for: KPAFRELGTCHN, LAMBDASER, KAPLAMBRATIO ?No results found for: IGGSERUM, IGA, IGMSERUM ?No results found for: TOTALPROTELP, ALBUMINELP, A1GS, A2GS, BETS, BETA2SER, GAMS, MSPIKE, SPEI ?  Chemistry   ?   ?Component Value Date/Time  ? NA 140 02/12/2021 0849  ? K 3.9 02/12/2021 0849  ? CL 106 02/12/2021 0849  ? CO2 24 02/12/2021 0849  ? BUN 15 02/12/2021 0849  ? CREATININE 0.99 02/12/2021 0849  ?    ?Component Value Date/Time  ? CALCIUM 9.4 02/12/2021 0849  ? ALKPHOS 57 02/12/2021 0849  ? AST 13 (L) 02/12/2021 0849  ? ALT 13 02/12/2021 0849  ? BILITOT 0.5 02/12/2021 0849  ?  ? ? ? ?  Impression and Plan: Ms. Ohmann is a very pleasant 66 yo African American female with long history of iron deficiency anemia.  ?Iron studies are pending. ?Follow-up in 4 months.  ? ?Lottie Dawson, NP ?3/29/20232:43 PM ? ?

## 2021-05-23 LAB — IRON AND IRON BINDING CAPACITY (CC-WL,HP ONLY)
Iron: 64 ug/dL (ref 28–170)
Saturation Ratios: 15 % (ref 10.4–31.8)
TIBC: 438 ug/dL (ref 250–450)
UIBC: 374 ug/dL (ref 148–442)

## 2021-05-23 LAB — FERRITIN: Ferritin: 25 ng/mL (ref 11–307)

## 2021-06-04 DIAGNOSIS — Z7985 Long-term (current) use of injectable non-insulin antidiabetic drugs: Secondary | ICD-10-CM | POA: Diagnosis not present

## 2021-06-04 DIAGNOSIS — Z7984 Long term (current) use of oral hypoglycemic drugs: Secondary | ICD-10-CM | POA: Diagnosis not present

## 2021-06-04 DIAGNOSIS — E1140 Type 2 diabetes mellitus with diabetic neuropathy, unspecified: Secondary | ICD-10-CM | POA: Diagnosis not present

## 2021-06-04 DIAGNOSIS — N182 Chronic kidney disease, stage 2 (mild): Secondary | ICD-10-CM | POA: Diagnosis not present

## 2021-06-04 DIAGNOSIS — E1165 Type 2 diabetes mellitus with hyperglycemia: Secondary | ICD-10-CM | POA: Diagnosis not present

## 2021-06-04 DIAGNOSIS — E1122 Type 2 diabetes mellitus with diabetic chronic kidney disease: Secondary | ICD-10-CM | POA: Diagnosis not present

## 2021-06-04 DIAGNOSIS — Z794 Long term (current) use of insulin: Secondary | ICD-10-CM | POA: Diagnosis not present

## 2021-06-04 DIAGNOSIS — Z9641 Presence of insulin pump (external) (internal): Secondary | ICD-10-CM | POA: Diagnosis not present

## 2021-06-04 NOTE — Patient Instructions (Addendum)
Asthma ?Continue montelukast 10 mg once a day to prevent cough or wheeze ?Continue Breztri 2 puffs twice a day with a spacer to prevent cough or wheeze ?Continue albuterol 2 puffs every 4 hours as needed for cough or wheeze OR Instead use albuterol 0.083% solution via nebulizer one unit vial every 4 hours as needed for cough or wheeze ?Continue to avoid cigarette smoke ?Continue to consider Fasenra injections in the future if neded ? ?Allergic rhinitis ?Continue allergen avoidance measures directed toward dust mite, cockroach, weed pollen, cat, and dog ?Continue allergen immunotherapy and have access to an epinephrine autoinjector set ?Continue Nasacort 1 to 2 sprays in each nostril once a day as needed for nasal congestion. ?Increase  azelastine 2 sprays in each nostril up to twice a day as needed for runny nose/drainage down throat ?Consider saline nasal rinses as needed for nasal symptoms. Use this before any medicated nasal sprays for best result. Increase saline rinses to twice a day for now ?Continue an antihistamine once a day as needed for runny nose or itch. Remember to rotate to a different antihistamine about every 3 months. Some examples of over the counter antihistamines include Zyrtec (cetirizine), Xyzal (levocetirizine), Allegra (fexofenadine), and Claritin (loratidine).  ? ?Allergic conjunctivitis ?Continue cromolyn 4% eyedrops.  Use 1 drop in each eye up to 4 times a day as needed for red, itchy, or watery eyes ? ?Reflux ?Continue dietary and lifestyle modifications as listed below ?Continue pantoprazole as previously prescribed ? ? ?Follow up in 2-3 months or sooner if needed. ? ? ?Lifestyle Changes for Controlling GERD ?When you have GERD, stomach acid feels as if it?s backing up toward your mouth. ?Whether or not you take medication to control your GERD, your symptoms can often be ?improved with lifestyle changes.  ? ?Raise Your Head ?Reflux is more likely to strike when you?re lying down flat,  because stomach fluid can ?flow backward more easily. Raising the head of your bed 4-6 inches can help. To do this: ?Slide blocks or books under the legs at the head of your bed. Or, place a wedge under ?the mattress. Many foam stores can make a suitable wedge for you. The wedge ?should run from your waist to the top of your head. ?Don?t just prop your head on several pillows. This increases pressure on your ?stomach. It can make GERD worse. ? ?Watch Your Eating Habits ?Certain foods may increase the acid in your stomach or relax the lower esophageal ?sphincter, making GERD more likely. It?s best to avoid the following: ?Coffee, tea, and carbonated drinks (with and without caffeine) ?Fatty, fried, or spicy food ?Mint, chocolate, onions, and tomatoes ?Any other foods that seem to irritate your stomach or cause you pain ? ?Relieve the Pressure ?Eat smaller meals, even if you have to eat more often. ?Don?t lie down right after you eat. Wait a few hours for your stomach to empty. ?Avoid tight belts and tight-fitting clothes. ?Lose excess weight. ? ?Tobacco and Alcohol ?Avoid smoking tobacco and drinking alcohol. They can make GERD symptoms worse. ? ?Reducing Pollen Exposure ?The American Academy of Allergy, Asthma and Immunology suggests the following steps to reduce your exposure to pollen during allergy seasons. ?Do not hang sheets or clothing out to dry; pollen may collect on these items. ?Do not mow lawns or spend time around freshly cut grass; mowing stirs up pollen. ?Keep windows closed at night.  Keep car windows closed while driving. ?Minimize morning activities outdoors, a time when pollen counts  are usually at their highest. ?Stay indoors as much as possible when pollen counts or humidity is high and on windy days when pollen tends to remain in the air longer. ?Use air conditioning when possible.  Many air conditioners have filters that trap the pollen spores. ?Use a HEPA room air filter to remove pollen form  the indoor air you breathe. ? ? ?Control of Dust Mite Allergen ?Dust mites play a major role in allergic asthma and rhinitis. They occur in environments with high humidity wherever human skin is found. Dust mites absorb humidity from the atmosphere (ie, they do not drink) and feed on organic matter (including shed human and animal skin). Dust mites are a microscopic type of insect that you cannot see with the naked eye. High levels of dust mites have been detected from mattresses, pillows, carpets, upholstered furniture, bed covers, clothes, soft toys and any woven material. The principal allergen of the dust mite is found in its feces. A gram of dust may contain 1,000 mites and 250,000 fecal particles. Mite antigen is easily measured in the air during house cleaning activities. Dust mites do not bite and do not cause harm to humans, other than by triggering allergies/asthma. ? ?Ways to decrease your exposure to dust mites in your home: ? ?1. Encase mattresses, box springs and pillows with a mite-impermeable barrier or cover ? ?2. Wash sheets, blankets and drapes weekly in hot water (130? F) with detergent and dry them in a dryer on the hot setting. ? ?3. Have the room cleaned frequently with a vacuum cleaner and a damp dust-mop. For carpeting or rugs, vacuuming with a vacuum cleaner equipped with a high-efficiency particulate air (HEPA) filter. The dust mite allergic individual should not be in a room which is being cleaned and should wait 1 hour after cleaning before going into the room. ? ?4. Do not sleep on upholstered furniture (eg, couches). ? ?5. If possible removing carpeting, upholstered furniture and drapery from the home is ideal. Horizontal blinds should be eliminated in the rooms where the person spends the most time (bedroom, study, television room). Washable vinyl, roller-type shades are optimal. ? ?6. Remove all non-washable stuffed toys from the bedroom. Wash stuffed toys weekly like sheets and  blankets above. ? ?7. Reduce indoor humidity to less than 50%. Inexpensive humidity monitors can be purchased at most hardware stores. Do not use a humidifier as can make the problem worse and are not recommended. ? ?Control of Dog or Cat Allergen ?Avoidance is the best way to manage a dog or cat allergy. If you have a dog or cat and are allergic to dog or cats, consider removing the dog or cat from the home. ?If you have a dog or cat but don?t want to find it a new home, or if your family wants a pet even though someone in the household is allergic, here are some strategies that may help keep symptoms at bay: ? ?Keep the pet out of your bedroom and restrict it to only a few rooms. Be advised that keeping the dog or cat in only one room will not limit the allergens to that room. ?Don?t pet, hug or kiss the dog or cat; if you do, wash your hands with soap and water. ?High-efficiency particulate air (HEPA) cleaners run continuously in a bedroom or living room can reduce allergen levels over time. ?Regular use of a high-efficiency vacuum cleaner or a central vacuum can reduce allergen levels. ?Giving your dog or cat  a bath at least once a week can reduce airborne allergen. ? ?Control of Cockroach Allergen ?Cockroach allergen has been identified as an important cause of acute attacks of asthma, especially in urban settings.  There are fifty-five species of cockroach that exist in the [place], however only three, the [place], [place] species produce allergen that can affect patients with Asthma.  Allergens can be obtained from fecal particles, egg casings and secretions from cockroaches. ?   ?Remove food sources. ?Reduce access to water. ?Seal access and entry points. ?Spray runways with 0.5-1% Diazinon or Chlorpyrifos ?Blow boric acid power under stoves and refrigerator. ?Place bait stations (hydramethylnon) at feeding sites. ? ? ?

## 2021-06-05 ENCOUNTER — Ambulatory Visit (INDEPENDENT_AMBULATORY_CARE_PROVIDER_SITE_OTHER): Payer: Medicare Other | Admitting: Family

## 2021-06-05 ENCOUNTER — Encounter: Payer: Self-pay | Admitting: Family

## 2021-06-05 VITALS — BP 112/70 | HR 71 | Temp 98.2°F | Resp 16 | Ht 65.0 in | Wt 247.5 lb

## 2021-06-05 DIAGNOSIS — H1013 Acute atopic conjunctivitis, bilateral: Secondary | ICD-10-CM | POA: Diagnosis not present

## 2021-06-05 DIAGNOSIS — K219 Gastro-esophageal reflux disease without esophagitis: Secondary | ICD-10-CM | POA: Diagnosis not present

## 2021-06-05 DIAGNOSIS — J309 Allergic rhinitis, unspecified: Secondary | ICD-10-CM | POA: Diagnosis not present

## 2021-06-05 DIAGNOSIS — H1010 Acute atopic conjunctivitis, unspecified eye: Secondary | ICD-10-CM

## 2021-06-05 DIAGNOSIS — J449 Chronic obstructive pulmonary disease, unspecified: Secondary | ICD-10-CM

## 2021-06-05 NOTE — Progress Notes (Signed)
? ?[place] [id number] ?Dept: [phone] ? ?FOLLOW UP NOTE ? ?Patient ID: Katrina Vega, female    DOB: 1955-06-27  Age: 66 y.o. MRN: [id number] ?Date of Office Visit: 06/05/2021 ? ?Assessment  ?Chief Complaint: Follow-up ? 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  She was last seen on March 15, 2021 by Gareth Morgan, FNP.  She denies any new diagnosis or surgery since her last office visit.  She reports that she just recently saw her heart doctor in March and everything was fine. ? ?Asthma is reported as moderately controlled with montelukast 10 mg once a day, Breztri 2 puffs twice a day with spacer, and albuterol as needed.  She does feel like her breathing has been better since the tenant that lives above her and smokes has not been there.  She reports a little bit of coughing that she feels is due to postnasal drip because she feels a tickle/scratchy sensation in her throat, a little bit of tightness in her chest, and a little bit of shortness of breath that she feels is due to her being "fat".  She also reports that last night that saliva went down the wrong way and made her gasp.  She denies fever, chills, and wheezing.  Since her last office visit she has not required any systemic steroids or made any trips to the emergency room or urgent care due to breathing problems.  She has not had to use her albuterol inhaler in the past couple weeks.  She does mention that she feels short of breath when walking fast. ? ?Allergic rhinitis is reported as moderately controlled with allergy injections per protocol, Nasacort 2 sprays each nostril once a day, azelastine 2 sprays each nostril once a day, saline rinses once a day, and Zyrtec 10 mg once a day.  She reports that her  allergy injections do help and she can tell when she misses an injection.  She denies large local reactions at the injection site.  She reports more postnasal drip and rhinorrhea at times if it is too hot outside.  She also had some nasal congestion on Monday.  She also notices some hoarseness winded sensation at times when she is talking on the phone. ? ?Allergic conjunctivitis is reported as moderately controlled with cromolyn 4% eyedrops.  She reports watery eyes at times. ? ?Reflux is reported as moderately controlled with pantoprazole as prescribed.  She reports reflux symptoms depending on her diet. ? ? ? ? ?Drug Allergies:  ?Allergies  ?Allergen Reactions  ? Contrast Media [Iodinated Contrast Media] Shortness Of Breath and Other (See Comments)  ?  sob, chest tight  ? Morphine Itching  ? Statins Diarrhea and Other (See Comments)  ?  nausea, diarrhea, myalgias  ? ? ?Review of Systems: ?Review of Systems  ?Constitutional:  Negative for chills and fever.  ?HENT:    ?     Reports nasal congestion on Monday, rhinorrhea at times if it is too hot and postnasal drip.  ?Eyes:   ?     Reports watery eyes  ?Respiratory:  Positive for cough and shortness of breath. Negative for wheezing.   ?     Reports a cough that she feels is due to the tickle/scratchy sensation in her throat, a little bit of tightness in her chest, and  a little bit of shortness of breath  ?Cardiovascular:  Negative for chest pain and palpitations.  ?Gastrointestinal:   ?     Reports reflux symptoms depending on her diet.  Continues to take pantoprazole  ?Genitourinary:  Negative for frequency.  ?Skin:  Negative for itching and rash.  ?Neurological:  Negative for headaches.  ?Endo/Heme/Allergies:  Positive for environmental allergies.  ? ? ?Physical Exam: ?BP 112/70   Pulse 71   Temp 98.2 ?F (36.8 ?C)   Resp 16   Ht '5\' 5"'$  (1.651 m)   Wt 247 lb 8 oz (112.3 kg)   SpO2 97%   BMI 41.19 kg/m?   ? ?Physical Exam ?Constitutional:   ?   Appearance:  Normal appearance.  ?HENT:  ?   Head: Normocephalic and atraumatic.  ?   Comments: Pharynx normal, eyes normal, ears normal, nose: Bilateral lower turbinates moderately edematous and slightly erythematous with no drainage noted ?   Right Ear: Tympanic membrane, ear canal and external ear normal.  ?   Left Ear: Tympanic membrane, ear canal and external ear normal.  ?   Mouth/Throat:  ?   Mouth: Mucous membranes are moist.  ?   Pharynx: Oropharynx is clear.  ?Eyes:  ?   Conjunctiva/sclera: Conjunctivae normal.  ?Cardiovascular:  ?   Rate and Rhythm: Normal rate and regular rhythm.  ?   Heart sounds: Normal heart sounds.  ?Pulmonary:  ?   Effort: Pulmonary effort is normal.  ?   Breath sounds: Normal breath sounds.  ?   Comments: Lungs clear to auscultation ?Musculoskeletal:  ?   Cervical back: Neck supple.  ?Skin: ?   General: Skin is warm.  ?Neurological:  ?   Mental Status: She is alert and oriented to person, place, and time.  ?Psychiatric:     ?   Mood and Affect: Mood normal.     ?   Behavior: Behavior normal.     ?   Thought Content: Thought content normal.     ?   Judgment: Judgment normal.  ? ? ?Diagnostics: ?FVC 1.77 L (68%), FEV1 1.57 L (77%).  Predicted FVC 2.60 L, predicted FEV1 2.04 L.  Spirometry indicates possible mild restriction. ? ?Assessment and Plan: ?1. Asthma-COPD overlap syndrome ([place])   ?2. Seasonal and perennial allergic rhinoconjunctivitis   ?3. Gastroesophageal reflux disease, unspecified whether esophagitis present   ? ? ?No orders of the defined types were placed in this encounter. ? ? ?Patient Instructions  ?Asthma ?Continue montelukast 10 mg once a day to prevent cough or wheeze ?Continue Breztri 2 puffs twice a day with a spacer to prevent cough or wheeze ?Continue albuterol 2 puffs every 4 hours as needed for cough or wheeze OR Instead use albuterol 0.083% solution via nebulizer one unit vial every 4 hours as needed for cough or wheeze ?Continue to avoid cigarette smoke ?Continue to  consider Fasenra injections in the future if neded ? ?Allergic rhinitis ?Continue allergen avoidance measures directed toward dust mite, cockroach, weed pollen, cat, and dog ?Continue allergen immunotherapy and have access to an epinephrine autoinjector set ?Continue Nasacort 1 to 2 sprays in each nostril once a day as needed for nasal congestion. ?Increase  azelastine 2 sprays in each nostril up to twice a day as needed for runny nose/drainage down throat ?Consider saline nasal rinses as needed for nasal symptoms. Use this before any medicated nasal sprays for best result. Increase saline rinses to twice a day for now ?Continue an antihistamine once  a day as needed for runny nose or itch. Remember to rotate to a different antihistamine about every 3 months. Some examples of over the counter antihistamines include Zyrtec (cetirizine), Xyzal (levocetirizine), Allegra (fexofenadine), and Claritin (loratidine).  ? ?Allergic conjunctivitis ?Continue cromolyn 4% eyedrops.  Use 1 drop in each eye up to 4 times a day as needed for red, itchy, or watery eyes ? ?Reflux ?Continue dietary and lifestyle modifications as listed below ?Continue pantoprazole as previously prescribed ? ? ?Follow up in 2-3 months or sooner if needed. ? ? ?Lifestyle Changes for Controlling GERD ?When you have GERD, stomach acid feels as if it?s backing up toward your mouth. ?Whether or not you take medication to control your GERD, your symptoms can often be ?improved with lifestyle changes.  ? ?Raise Your Head ?Reflux is more likely to strike when you?re lying down flat, because stomach fluid can ?flow backward more easily. Raising the head of your bed 4-6 inches can help. To do this: ?Slide blocks or books under the legs at the head of your bed. Or, place a wedge under ?the mattress. Many foam stores can make a suitable wedge for you. The wedge ?should run from your waist to the top of your head. ?Don?t just prop your head on several pillows. This  increases pressure on your ?stomach. It can make GERD worse. ? ?Watch Your Eating Habits ?Certain foods may increase the acid in your stomach or relax the lower esophageal ?sphincter, making GERD more likely. It?s best

## 2021-06-14 ENCOUNTER — Other Ambulatory Visit: Payer: Self-pay | Admitting: Family Medicine

## 2021-06-14 ENCOUNTER — Telehealth: Payer: Self-pay | Admitting: Family Medicine

## 2021-06-14 NOTE — Telephone Encounter (Signed)
Called and left a detailed voicemail advising per [facility] Fl[facility]en permission.  ?

## 2021-06-14 NOTE — Telephone Encounter (Signed)
Patient wanted to know if she should get her allergy injection. Patient states she has what she feels is a sinus infection. I advised patient that she would have to hold off until she feels better. Patient stated it was her allergies, I did advise her if she believes it is a sinus infection then she would have to hold off. Patient verbalized understanding & then asked for an antibiotic to be sent in.  ? ?Patient states she has been coughing, wheezing and has had a runny nose. Patient states it started this morning & takes her allergy medication regularly. Patient has had to use her rescue inhaler more than usual.  ? ?[address], [place], [place] [address] ? ?Best contact number: [phone] ? ?

## 2021-06-14 NOTE — Telephone Encounter (Signed)
Please have her continue with nasal saline rinses, nasal steroid and azelastine and cetirizine. She can take an additional cetirizine if needed. Please have her hold her allergy injection until she is feeling better.  Please have her call with any worsening of symptoms or if she develops a fever.

## 2021-06-14 NOTE — Telephone Encounter (Signed)
What are you recommendations, Webb Silversmith?  ?

## 2021-06-15 DIAGNOSIS — E119 Type 2 diabetes mellitus without complications: Secondary | ICD-10-CM | POA: Diagnosis not present

## 2021-06-17 ENCOUNTER — Other Ambulatory Visit: Payer: Self-pay

## 2021-06-17 ENCOUNTER — Other Ambulatory Visit: Payer: Self-pay | Admitting: Allergy and Immunology

## 2021-06-17 MED ORDER — LEVOCETIRIZINE DIHYDROCHLORIDE 5 MG PO TABS: ORAL_TABLET | ORAL | 1 refills | Status: DC

## 2021-06-17 MED ORDER — BREZTRI AEROSPHERE 160-9-4.8 MCG/ACT IN AERO
INHALATION_SPRAY | RESPIRATORY_TRACT | 1 refills | Status: DC
Start: 2021-06-17 — End: 2022-06-16

## 2021-07-02 ENCOUNTER — Other Ambulatory Visit: Payer: Self-pay | Admitting: Family

## 2021-07-11 ENCOUNTER — Other Ambulatory Visit: Payer: Self-pay

## 2021-07-11 MED ORDER — CROMOLYN SODIUM 4 % OP SOLN: OPHTHALMIC | 1 refills | Status: DC

## 2021-07-15 DIAGNOSIS — E119 Type 2 diabetes mellitus without complications: Secondary | ICD-10-CM | POA: Diagnosis not present

## 2021-07-16 ENCOUNTER — Ambulatory Visit (INDEPENDENT_AMBULATORY_CARE_PROVIDER_SITE_OTHER): Payer: Medicare Other

## 2021-07-16 ENCOUNTER — Telehealth: Payer: Self-pay

## 2021-07-16 VITALS — BP 102/67 | HR 73 | Temp 98.5°F | Resp 16 | Ht 65.0 in | Wt 248.4 lb

## 2021-07-16 DIAGNOSIS — Z780 Asymptomatic menopausal state: Secondary | ICD-10-CM | POA: Diagnosis not present

## 2021-07-16 DIAGNOSIS — Z0000 Encounter for general adult medical examination without abnormal findings: Secondary | ICD-10-CM

## 2021-07-16 NOTE — Telephone Encounter (Signed)
She is having back pain that goes down into her legs.

## 2021-07-16 NOTE — Progress Notes (Addendum)
Subjective:   Katrina Vega is a 66 y.o. female who presents for an Initial Medicare Annual Wellness Visit.  Review of Systems     Cardiac Risk Factors include: advanced age (>71mn, >>30women);diabetes mellitus;hypertension;dyslipidemia;obesity (BMI >30kg/m2)     Objective:    Today's Vitals   07/16/21 0856 07/16/21 0913  BP: 102/67   Pulse: 73   Resp: 16   Temp: 98.5 F (36.9 C)   SpO2: 94%   Weight: 248 lb 6.4 oz (112.7 kg)   Height: 5' 5"  (1.651 m)   PainSc:  6    Body mass index is 41.34 kg/m.     07/16/2021    8:59 AM 05/22/2021    2:17 PM 02/22/2021    7:49 AM 02/12/2021    9:12 AM  Advanced Directives  Does Patient Have a Medical Advance Directive? No No No No  Would patient like information on creating a medical advance directive? No - Patient declined No - Patient declined  No - Patient declined    Current Medications (verified) Outpatient Encounter Medications as of 07/16/2021  Medication Sig   albuterol (PROVENTIL) (2.5 MG/3ML) 0.083% nebulizer solution Take 3 mLs (2.5 mg total) by nebulization every 6 (six) hours as needed for wheezing or shortness of breath.   albuterol (VENTOLIN HFA) 108 (90 Base) MCG/ACT inhaler Inhale 2 puffs into the lungs every 6 (six) hours as needed for wheezing.   azelastine (ASTELIN) 0.1 % nasal spray 1-2 sprays per nostril 2 times daily as needed for runny nose.   BD AUTOSHIELD DUO 30G X 5 MM MISC 1 KIT BY MISC ROUTE 3 TIMES DAILY BEFORE MEALS   Budeson-Glycopyrrol-Formoterol (BREZTRI AEROSPHERE) 160-9-4.8 MCG/ACT AERO Inhale two puffs twice daily to prevent cough or wheeze.  Rinse, gargle, and spit after use.   cholecalciferol (VITAMIN D3) 25 MCG (1000 UNIT) tablet Take 1,000 Units by mouth daily.   clopidogrel (PLAVIX) 75 MG tablet TAKE 1 TABLET BY MOUTH DAILY   Continuous Blood Gluc Sensor (DEXCOM G6 SENSOR) MISC USE 1 SENSOR AS NEEDED CHANGE EVERY 10 DAYS   cromolyn (OPTICROM) 4 % ophthalmic solution Use 1 drop in each eye  up to 4 times a day as needed for red, itchy, or watery eyes   empagliflozin (JARDIANCE) 10 MG TABS tablet Take 10 mg by mouth daily.   EPINEPHrine (EPIPEN 2-PAK) 0.3 mg/0.3 mL IJ SOAJ injection Use as directed for severe allergic reactions   gabapentin (NEURONTIN) 300 MG capsule TAKE 1 CAPSULE BY MOUTH 3 TIMES  DAILY   glucagon 1 MG injection Inject 1 mg into the muscle once as needed (low blood sugar).   glucose 4 GM chewable tablet Chew 1 tablet by mouth once as needed for low blood sugar.   HUMULIN N 100 UNIT/ML injection Inject 60 Units into the skin 3 (three) times daily before meals.   isosorbide mononitrate (IMDUR) 60 MG 24 hr tablet TAKE 1 TABLET BY MOUTH  DAILY   levocetirizine (XYZAL) 5 MG tablet Can take one tablet every evening if needed.   lisinopril (ZESTRIL) 20 MG tablet Take 1 tablet (20 mg total) by mouth daily.   metoprolol tartrate (LOPRESSOR) 50 MG tablet TAKE 1 TABLET BY MOUTH  EVERY 12 HOURS   montelukast (SINGULAIR) 10 MG tablet TAKE 1 TABLET(10 MG) BY MOUTH AT BEDTIME   nitroGLYCERIN (NITROSTAT) 0.4 MG SL tablet Place 1 tablet (0.4 mg total) under the tongue every 5 (five) minutes as needed for chest pain.   pantoprazole (PROTONIX)  40 MG tablet TAKE 1 TABLET(40 MG) BY MOUTH IN THE MORNING AND AT BEDTIME   psyllium (METAMUCIL) 58.6 % packet Take 1 packet by mouth daily as needed (regularity).   Respiratory Therapy Supplies (CARETOUCH 2 CPAP HOSE HANGER) MISC by Does not apply route.   rosuvastatin (CRESTOR) 40 MG tablet Take 1 tablet (40 mg total) by mouth at bedtime.   Semaglutide, 1 MG/DOSE, (OZEMPIC, 1 MG/DOSE,) 4 MG/3ML SOPN Inject 2 mg into the skin every Sunday.   torsemide (DEMADEX) 20 MG tablet TAKE 1 TABLET (20 MG TOTAL) BY  MOUTH DAILY AS NEEDED (FLUID  BUILD UP).   No facility-administered encounter medications on file as of 07/16/2021.    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   Past Surgical History:  Procedure Laterality Date   APPENDECTOMY  1974   BIOPSY  02/22/2021   Procedure: BIOPSY;  Surgeon: Carol Ada, MD;  Location: [facility];  Service: Endoscopy;;   CARPAL TUNNEL RELEASE  2017   COLONOSCOPY WITH PROPOFOL N/A 02/22/2021   Procedure: COLONOSCOPY WITH PROPOFOL;  Surgeon: Carol Ada, MD;  Location: [facility];  Service: Endoscopy;  Laterality: N/A;   ESOPHAGOGASTRODUODENOSCOPY (EGD) WITH PROPOFOL N/A 02/22/2021   Procedure: ESOPHAGOGASTRODUODENOSCOPY (EGD) WITH PROPOFOL;  Surgeon: Carol Ada, MD;  Location: [facility];  Service: Endoscopy;  Laterality: N/A;   LUMBAR SPINE SURGERY  2010   POLYPECTOMY  02/22/2021   Procedure: POLYPECTOMY;  Surgeon: Carol Ada, MD;  Location: [facility];  Service: Endoscopy;;   REPLACEMENT TOTAL KNEE  2017 and 2018   TONSILLECTOMY     TOTAL ABDOMINAL HYSTERECTOMY  1989   TOTAL HIP ARTHROPLASTY  04/2018   TOTAL SHOULDER ARTHROPLASTY  11/2019   Family History  Problem Relation Age of Onset   Heart disease Mother    Breast cancer Mother    Heart attack Father    Lung cancer Father    Eczema Grandson    Allergic rhinitis Neg Hx    Angioedema Neg Hx    Asthma Neg Hx    Atopy Neg Hx    Immunodeficiency Neg Hx    Urticaria Neg Hx    Social History   Socioeconomic History   Marital status: Single    Spouse name: Not on file   Number of children: 5   Years of education: Not on file   Highest education level: Not on file  Occupational History   Not on file  Tobacco Use   Smoking status: Former    Packs/day: 0.50    Years: 15.00    Pack years: 7.50    Types: Cigarettes    Quit date: 10/14/1995    Years since quitting: 25.7   Smokeless  tobacco: Never  Vaping Use   Vaping Use: Never used  Substance and Sexual Activity   Alcohol use: Not Currently   Drug use: Never   Sexual activity: Not on file  Other Topics Concern   Not on file  Social History Narrative   Not on file   Social Determinants of Health   Financial Resource Strain: Low Risk    Difficulty of Paying Living Expenses: Not hard  at all  Food Insecurity: No Food Insecurity   Worried About Charity fundraiser in the Last Year: Never true   Ran Out of Food in the Last Year: Never true  Transportation Needs: No Transportation Needs   Lack of Transportation (Medical): No   Lack of Transportation (Non-Medical): No  Physical Activity: Inactive   Days of Exercise per Week: 0 days   Minutes of Exercise per Session: 0 min  Stress: No Stress Concern Present   Feeling of Stress : Only a little  Social Connections: Moderately Isolated   Frequency of Communication with Friends and Family: More than three times a week   Frequency of Social Gatherings with Friends and Family: Never   Attends Religious Services: More than 4 times per year   Active Member of [employer] or Organizations: No   Attends Music therapist: Never   Marital Status: Never married    Tobacco Counseling Counseling given: Not Answered   Clinical Intake:  Pre-visit preparation completed: Yes  Pain : 0-10 Pain Score: 6  Pain Type: Chronic pain Pain Location: Back Pain Descriptors / Indicators: Aching, Dull, Sore Pain Onset: More than a month ago Pain Frequency: Intermittent     Nutritional Status: BMI > 30  Obese Nutritional Risks: None Diabetes: Yes CBG done?: No Did pt. bring in CBG monitor from home?: No  How often do you need to have someone help you when you read instructions, pamphlets, or other written materials from your doctor or pharmacy?: 1 - Never  Diabetic?yes Nutrition Risk Assessment:  Has the patient had any N/V/D within the last 2 months?  No  Does  the patient have any non-healing wounds?  No  Has the patient had any unintentional weight loss or weight gain?  No   Diabetes:  Is the patient diabetic?  Yes  If diabetic, was a CBG obtained today?  No  Did the patient bring in their glucometer from home?  No  How often do you monitor your CBG's? No, automatic.   Financial Strains and Diabetes Management:  Are you having any financial strains with the device, your supplies or your medication? No .  Does the patient want to be seen by Chronic Care Management for management of their diabetes?  No  Would the patient like to be referred to a Nutritionist or for Diabetic Management?  No   Diabetic Exams:  Diabetic Eye Exam: Overdue for diabetic eye exam. Pt has been advised about the importance in completing this exam. Patient advised to call and schedule an eye exam. Diabetic Foot Exam: Completed 11/21/20     Interpreter Needed?: No  Information entered by :: [employer] of Daily Living    07/16/2021    9:23 AM  In your present state of health, do you have any difficulty performing the following activities:  Hearing? 0  Vision? 0  Difficulty concentrating or making decisions? 0  Walking or climbing stairs? 1  Dressing or bathing? 1  Doing errands, shopping? 0  Preparing Food and eating ? N  Using the Toilet? N  In the past six months, have you accidently leaked urine? N  Do you have problems with loss of bowel control? N  Managing your Medications? N  Managing your Finances? N  Housekeeping or managing your Housekeeping? N    Patient Care Team: Marrian Salvage, FNP as PCP - General (Internal Medicine)  Indicate any recent Medical Services you may have received from other  than [facility] providers in the past year (date may be approximate).     Assessment:   This is a routine wellness examination for Sheran.  Hearing/Vision screen No results found.  Dietary issues and exercise activities  discussed: Current Exercise Habits: The patient does not participate in regular exercise at present, Exercise limited by: None identified   Goals Addressed   None    Depression Screen    07/16/2021    9:09 AM 01/31/2021   10:21 AM  PHQ 2/9 Scores  PHQ - 2 Score 0 0    Fall Risk    07/16/2021    9:01 AM  [place] in the past year? 0  Number falls in past yr: 0  Injury with Fall? 0  Risk for fall due to : No Fall Risks  Follow up Falls evaluation completed    [place]:  Any stairs in or around the home? No  If so, are there any without handrails?  N/A Home free of loose throw rugs in walkways, pet beds, electrical cords, etc? Yes  Adequate lighting in your home to reduce risk of falls? Yes   ASSISTIVE DEVICES UTILIZED TO PREVENT FALLS:  Life alert? No  Use of a cane, walker or w/c? No  Grab bars in the bathroom? Yes  Shower chair or bench in shower? Yes  Elevated toilet seat or a handicapped toilet? Yes   TIMED UP AND GO:  Was the test performed? Yes .  Length of time to ambulate 10 feet: 18 sec.   Gait steady and fast without use of assistive device  Cognitive Function:        07/16/2021    9:27 AM  6CIT Screen  What Year? 0 points  What month? 0 points  What time? 0 points  Count back from 20 0 points  Months in reverse 0 points  Repeat phrase 0 points  Total Score 0 points    Immunizations Immunization History  Administered Date(s) Administered   Influenza Split 12/26/2010, 11/21/2011   Influenza, Quadrivalent, Recombinant, Inj, Pf 11/10/2018   Influenza,inj,Quad PF,6+ Mos 03/12/2015, 11/10/2015, 11/26/2016, 12/02/2017   Influenza-Unspecified 11/21/2011, 03/12/2015, 12/02/2017   PFIZER(Purple Top)SARS-COV-2 Vaccination 03/17/2019, 04/07/2019   PPD Test 04/28/2017   Pneumococcal Polysaccharide-23 06/14/2012   Tdap 09/24/2010   Zoster Recombinat (Shingrix) 08/07/2015, 11/17/2018   Zoster, Live  08/12/2015    TDAP status: Due, Education has been provided regarding the importance of this vaccine. Advised may receive this vaccine at local pharmacy or Health Dept. Aware to provide a copy of the vaccination record if obtained from local pharmacy or Health Dept. Verbalized acceptance and understanding.  Flu Vaccine status: Declined, Education has been provided regarding the importance of this vaccine but patient still declined. Advised may receive this vaccine at local pharmacy or Health Dept. Aware to provide a copy of the vaccination record if obtained from local pharmacy or Health Dept. Verbalized acceptance and understanding.  Pneumococcal vaccine status: Declined,  Education has been provided regarding the importance of this vaccine but patient still declined. Advised may receive this vaccine at local pharmacy or Health Dept. Aware to provide a copy of the vaccination record if obtained from local pharmacy or Health Dept. Verbalized acceptance and understanding.   Covid-19 vaccine status: Information provided on how to obtain vaccines.   Qualifies for Shingles Vaccine? Yes   Zostavax completed No   Shingrix Completed?: Yes  Screening Tests Health Maintenance  Topic Date  Due   OPHTHALMOLOGY EXAM  Never done   Pneumonia Vaccine 53+ Years old (2 - PCV) 06/14/2013   DEXA SCAN  Never done   COVID-19 Vaccine (3 - Pfizer risk series) 05/05/2019   TETANUS/TDAP  09/23/2020   INFLUENZA VACCINE  09/24/2021   HEMOGLOBIN A1C  10/26/2021   FOOT EXAM  11/21/2021   MAMMOGRAM  08/24/2022   COLONOSCOPY (Pts 45-18yr Insurance coverage will need to be confirmed)  02/23/2031   Hepatitis C Screening  Completed   Zoster Vaccines- Shingrix  Completed   HPV VACCINES  Aged Out    Health Maintenance  Health Maintenance Due  Topic Date Due   OPHTHALMOLOGY EXAM  Never done   Pneumonia Vaccine 66 Years old (2 - PCV) 06/14/2013   DEXA SCAN  Never done   COVID-19 Vaccine (3 - Pfizer risk series)  05/05/2019   TETANUS/TDAP  09/23/2020    Colorectal cancer screening: Type of screening: Colonoscopy. Completed 02/22/21. Repeat every 10 years  Mammogram status: Completed 08/23/20. Repeat every year  Bone Density status: Ordered 07/16/21. Pt provided with contact info and advised to call to schedule appt.  Lung Cancer Screening: (Low Dose CT Chest recommended if Age 68-80years, 30 pack-year currently smoking OR have quit w/in 15years.) does not qualify.   Lung Cancer Screening Referral: N/A  Additional Screening:  Hepatitis C Screening: does qualify; Completed 01/31/21  Vision Screening: Recommended annual ophthalmology exams for early detection of glaucoma and other disorders of the eye. Is the patient up to date with their annual eye exam?  Yes  Who is the provider or what is the name of the office in which the patient attends annual eye exams? N/A If pt is not established with a provider, would they like to be referred to a provider to establish care? No .   Dental Screening: Recommended annual dental exams for proper oral hygiene  Community Resource Referral / Chronic Care Management: CRR required this visit?  No   CCM required this visit?  No      Plan:     I have personally reviewed and noted the following in the patient's chart:   Medical and social history Use of alcohol, tobacco or illicit drugs  Current medications and supplements including opioid prescriptions. Patient is not currently taking opioid prescriptions. Functional ability and status Nutritional status Physical activity Advanced directives List of other physicians Hospitalizations, surgeries, and ER visits in previous 12 months Vitals Screenings to include cognitive, depression, and falls Referrals and appointments  In addition, I have reviewed and discussed with patient certain preventive protocols, quality metrics, and best practice recommendations. A written personalized care plan for  preventive services as well as general preventive health recommendations were provided to patient.     SDuard BradyChism, [place]  07/16/2021   Nurse Notes: none  Medical screening examination/treatment/procedure(s) were performed by non-physician practitioner and as supervising provider I was immediately available for consultation/collaboration.  I agree with above. LMarrian Salvage FNP

## 2021-07-16 NOTE — Telephone Encounter (Signed)
Pt would like a a referral to check her nerve problems.

## 2021-07-16 NOTE — Patient Instructions (Signed)
[name] , Thank you for taking time to come for your Medicare Wellness Visit. I appreciate your ongoing commitment to your health goals. Please review the following plan we discussed and let me know if I can assist you in the future.   Screening recommendations/referrals: Colonoscopy: 02/22/21 due 02/23/31 Mammogram: 08/23/20 due 08/24/22 Bone Density: ordered 07/16/21 Recommended yearly ophthalmology/optometry visit for glaucoma screening and checkup Recommended yearly dental visit for hygiene and checkup  Vaccinations: Influenza vaccine: Due-May obtain vaccine at our office or your local pharmacy.  Pneumococcal vaccine: Due-May obtain vaccine at our office or your local pharmacy.  Tdap vaccine: Due-May obtain vaccine at our office or your local pharmacy.  Shingles vaccine: up to date   Covid-19:Due-May obtain vaccine at our office or your local pharmacy.   Advanced directives: no  Conditions/risks identified: see problem list   Next appointment: Follow up in one year for your annual wellness visit    Preventive Care 65 Years and Older, Female Preventive care refers to lifestyle choices and visits with your health care provider that can promote health and wellness. What does preventive care include? A yearly physical exam. This is also called an annual well check. Dental exams once or twice a year. Routine eye exams. Ask your health care provider how often you should have your eyes checked. Personal lifestyle choices, including: Daily care of your teeth and gums. Regular physical activity. Eating a healthy diet. Avoiding tobacco and drug use. Limiting alcohol use. Practicing safe sex. Taking low-dose aspirin every day. Taking vitamin and mineral supplements as recommended by your health care provider. What happens during an annual well check? The services and screenings done by your health care provider during your annual well check will depend on your age, overall health,  lifestyle risk factors, and family history of disease. Counseling  Your health care provider may ask you questions about your: Alcohol use. Tobacco use. Drug use. Emotional well-being. Home and relationship well-being. Sexual activity. Eating habits. History of falls. Memory and ability to understand (cognition). Work and work Statistician. Reproductive health. Screening  You may have the following tests or measurements: Height, weight, and BMI. Blood pressure. Lipid and cholesterol levels. These may be checked every 5 years, or more frequently if you are over 27 years old. Skin check. Lung cancer screening. You may have this screening every year starting at age 44 if you have a 30-pack-year history of smoking and currently smoke or have quit within the past 15 years. Fecal occult blood test (FOBT) of the stool. You may have this test every year starting at age 76. Flexible sigmoidoscopy or colonoscopy. You may have a sigmoidoscopy every 5 years or a colonoscopy every 10 years starting at age 66. Hepatitis C blood test. Hepatitis B blood test. Sexually transmitted disease (STD) testing. Diabetes screening. This is done by checking your blood sugar (glucose) after you have not eaten for a while (fasting). You may have this done every 1-3 years. Bone density scan. This is done to screen for osteoporosis. You may have this done starting at age 31. Mammogram. This may be done every 1-2 years. Talk to your health care provider about how often you should have regular mammograms. Talk with your health care provider about your test results, treatment options, and if necessary, the need for more tests. Vaccines  Your health care provider may recommend certain vaccines, such as: Influenza vaccine. This is recommended every year. Tetanus, diphtheria, and acellular pertussis (Tdap, Td) vaccine. You may need  a Td booster every 10 years. Zoster vaccine. You may need this after age  60. Pneumococcal 13-valent conjugate (PCV13) vaccine. One dose is recommended after age 72. Pneumococcal polysaccharide (PPSV23) vaccine. One dose is recommended after age 78. Talk to your health care provider about which screenings and vaccines you need and how often you need them. This information is not intended to replace advice given to you by your health care provider. Make sure you discuss any questions you have with your health care provider. Document Released: 03/09/2015 Document Revised: 10/31/2015 Document Reviewed: 12/12/2014 Elsevier Interactive Patient Education  2017 [place] Prevention in the Home Falls can cause injuries. They can happen to people of all ages. There are many things you can do to make your home safe and to help prevent falls. What can I do on the outside of my home? Regularly fix the edges of walkways and driveways and fix any cracks. Remove anything that might make you trip as you walk through a door, such as a raised step or threshold. Trim any bushes or trees on the path to your home. Use bright outdoor lighting. Clear any walking paths of anything that might make someone trip, such as rocks or tools. Regularly check to see if handrails are loose or broken. Make sure that both sides of any steps have handrails. Any raised decks and porches should have guardrails on the edges. Have any leaves, snow, or ice cleared regularly. Use sand or salt on walking paths during winter. Clean up any spills in your garage right away. This includes oil or grease spills. What can I do in the bathroom? Use night lights. Install grab bars by the toilet and in the tub and shower. Do not use towel bars as grab bars. Use non-skid mats or decals in the tub or shower. If you need to sit down in the shower, use a plastic, non-slip stool. Keep the floor dry. Clean up any water that spills on the floor as soon as it happens. Remove soap buildup in the tub or shower  regularly. Attach bath mats securely with double-sided non-slip rug tape. Do not have throw rugs and other things on the floor that can make you trip. What can I do in the bedroom? Use night lights. Make sure that you have a light by your bed that is easy to reach. Do not use any sheets or blankets that are too big for your bed. They should not hang down onto the floor. Have a firm chair that has side arms. You can use this for support while you get dressed. Do not have throw rugs and other things on the floor that can make you trip. What can I do in the kitchen? Clean up any spills right away. Avoid walking on wet floors. Keep items that you use a lot in easy-to-reach places. If you need to reach something above you, use a strong step stool that has a grab bar. Keep electrical cords out of the way. Do not use floor polish or wax that makes floors slippery. If you must use wax, use non-skid floor wax. Do not have throw rugs and other things on the floor that can make you trip. What can I do with my stairs? Do not leave any items on the stairs. Make sure that there are handrails on both sides of the stairs and use them. Fix handrails that are broken or loose. Make sure that handrails are as long as the stairways. Check  any carpeting to make sure that it is firmly attached to the stairs. Fix any carpet that is loose or worn. Avoid having throw rugs at the top or bottom of the stairs. If you do have throw rugs, attach them to the floor with carpet tape. Make sure that you have a light switch at the top of the stairs and the bottom of the stairs. If you do not have them, ask someone to add them for you. What else can I do to help prevent falls? Wear shoes that: Do not have high heels. Have rubber bottoms. Are comfortable and fit you well. Are closed at the toe. Do not wear sandals. If you use a stepladder: Make sure that it is fully opened. Do not climb a closed stepladder. Make sure that  both sides of the stepladder are locked into place. Ask someone to hold it for you, if possible. Clearly mark and make sure that you can see: Any grab bars or handrails. First and last steps. Where the edge of each step is. Use tools that help you move around (mobility aids) if they are needed. These include: Canes. Walkers. Scooters. Crutches. Turn on the lights when you go into a dark area. Replace any light bulbs as soon as they burn out. Set up your furniture so you have a clear path. Avoid moving your furniture around. If any of your floors are uneven, fix them. If there are any pets around you, be aware of where they are. Review your medicines with your doctor. Some medicines can make you feel dizzy. This can increase your chance of falling. Ask your doctor what other things that you can do to help prevent falls. This information is not intended to replace advice given to you by your health care provider. Make sure you discuss any questions you have with your health care provider. Document Released: 12/07/2008 Document Revised: 07/19/2015 Document Reviewed: 03/17/2014 Elsevier Interactive Patient Education  2017 [employer].

## 2021-07-18 ENCOUNTER — Ambulatory Visit (HOSPITAL_BASED_OUTPATIENT_CLINIC_OR_DEPARTMENT_OTHER)
Admission: RE | Admit: 2021-07-18 | Discharge: 2021-07-18 | Disposition: A | Discharge status: Home / Self Care | Payer: Medicare Other | Source: Ambulatory Visit | Attending: Family | Admitting: Family

## 2021-07-18 DIAGNOSIS — Z780 Asymptomatic menopausal state: Secondary | ICD-10-CM | POA: Insufficient documentation

## 2021-07-18 DIAGNOSIS — E1165 Type 2 diabetes mellitus with hyperglycemia: Secondary | ICD-10-CM | POA: Diagnosis not present

## 2021-07-18 DIAGNOSIS — Z794 Long term (current) use of insulin: Secondary | ICD-10-CM | POA: Diagnosis not present

## 2021-07-25 ENCOUNTER — Emergency Department (HOSPITAL_BASED_OUTPATIENT_CLINIC_OR_DEPARTMENT_OTHER)
Admission: EM | Admit: 2021-07-25 | Discharge: 2021-07-25 | Disposition: A | Discharge status: Home / Self Care | Payer: Medicare Other | Attending: Emergency Medicine | Admitting: Emergency Medicine

## 2021-07-25 ENCOUNTER — Other Ambulatory Visit: Payer: Self-pay

## 2021-07-25 ENCOUNTER — Emergency Department (HOSPITAL_BASED_OUTPATIENT_CLINIC_OR_DEPARTMENT_OTHER): Payer: Medicare Other

## 2021-07-25 ENCOUNTER — Emergency Department (HOSPITAL_BASED_OUTPATIENT_CLINIC_OR_DEPARTMENT_OTHER): Payer: Medicare Other | Admitting: Radiology

## 2021-07-25 ENCOUNTER — Other Ambulatory Visit (HOSPITAL_BASED_OUTPATIENT_CLINIC_OR_DEPARTMENT_OTHER): Payer: Self-pay

## 2021-07-25 DIAGNOSIS — S0990XA Unspecified injury of head, initial encounter: Secondary | ICD-10-CM | POA: Diagnosis not present

## 2021-07-25 DIAGNOSIS — Z794 Long term (current) use of insulin: Secondary | ICD-10-CM | POA: Diagnosis not present

## 2021-07-25 DIAGNOSIS — M545 Low back pain, unspecified: Secondary | ICD-10-CM | POA: Diagnosis not present

## 2021-07-25 DIAGNOSIS — W01198A Fall on same level from slipping, tripping and stumbling with subsequent striking against other object, initial encounter: Secondary | ICD-10-CM | POA: Diagnosis not present

## 2021-07-25 DIAGNOSIS — Y92039 Unspecified place in apartment as the place of occurrence of the external cause: Secondary | ICD-10-CM | POA: Diagnosis not present

## 2021-07-25 DIAGNOSIS — Z043 Encounter for examination and observation following other accident: Secondary | ICD-10-CM | POA: Diagnosis not present

## 2021-07-25 DIAGNOSIS — M5459 Other low back pain: Secondary | ICD-10-CM | POA: Diagnosis not present

## 2021-07-25 DIAGNOSIS — M25551 Pain in right hip: Secondary | ICD-10-CM | POA: Diagnosis not present

## 2021-07-25 DIAGNOSIS — Y9301 Activity, walking, marching and hiking: Secondary | ICD-10-CM | POA: Insufficient documentation

## 2021-07-25 DIAGNOSIS — M47816 Spondylosis without myelopathy or radiculopathy, lumbar region: Secondary | ICD-10-CM | POA: Diagnosis not present

## 2021-07-25 MED ORDER — KETOROLAC TROMETHAMINE 15 MG/ML IJ SOLN
15.0000 mg | Freq: Once | INTRAMUSCULAR | Status: AC
  Administered 2021-07-25: 15 mg via INTRAMUSCULAR
  Filled 2021-07-25: qty 1

## 2021-07-25 MED ORDER — DICLOFENAC SODIUM 1 % EX GEL
4.0000 g | Freq: Four times a day (QID) | CUTANEOUS | 0 refills | Status: DC
  Filled 2021-07-25: qty 100, 7d supply, fill #0

## 2021-07-25 NOTE — ED Triage Notes (Signed)
Patient reports she just moved to her apartment and had on grippy socks and slid and her back hit her TV stand and hurt her right hip. Patient reports pain with walking. States she also hit her head. Endorses taking Plavix.

## 2021-07-25 NOTE — ED Provider Notes (Signed)
[facility] EMERGENCY DEPT Provider Note   CSN: [id number] Arrival date & time: 07/25/21  0913     History  Chief Complaint  Patient presents with   Katrina Vega is a 66 y.o. female.  66 yo F with a chief complaint of a fall.  The patient was walking her apartment and she slipped on the concrete's and struck her back and head on a TV stand.  Felt a little jarred from the event but denied any initial discomfort.  Stayed up for about an hour without significant issue and went to bed.  Woke up this morning complaining mostly of right-sided low back pain.  Has chronic back pain and feels like this is a bit worse.  Denies any radiation to the leg.  Had some trouble ambulating and so came here for evaluation.   Fall      Home Medications Prior to Admission medications   Medication Sig Start Date End Date Taking? Authorizing Provider  diclofenac Sodium (VOLTAREN) 1 % GEL Apply 4 g topically 4 (four) times daily. 07/25/21  Yes Deno Etienne, DO  albuterol (PROVENTIL) (2.5 MG/3ML) 0.083% nebulizer solution Take 3 mLs (2.5 mg total) by nebulization every 6 (six) hours as needed for wheezing or shortness of breath. 03/15/21   Dara Hoyer, FNP  albuterol (VENTOLIN HFA) 108 (90 Base) MCG/ACT inhaler Inhale 2 puffs into the lungs every 6 (six) hours as needed for wheezing. 04/03/21   Dara Hoyer, FNP  azelastine (ASTELIN) 0.1 % nasal spray 1-2 sprays per nostril 2 times daily as needed for runny nose. 05/09/20   Garnet Sierras, DO  BD AUTOSHIELD DUO 30G X 5 MM MISC 1 KIT BY MISC ROUTE 3 TIMES DAILY BEFORE MEALS 10/08/20   [provider]  Budeson-Glycopyrrol-Formoterol (BREZTRI AEROSPHERE) 160-9-4.8 MCG/ACT AERO Inhale two puffs twice daily to prevent cough or wheeze.  Rinse, gargle, and spit after use. 06/17/21   Ambs, Kathrine Cords, FNP  cholecalciferol (VITAMIN D3) 25 MCG (1000 UNIT) tablet Take 1,000 Units by mouth daily.    [provider]  clopidogrel (PLAVIX) 75 MG  tablet TAKE 1 TABLET BY MOUTH DAILY 05/06/21   Adrian Prows, MD  Continuous Blood Gluc Sensor (DEXCOM G6 SENSOR) MISC USE 1 SENSOR AS NEEDED CHANGE EVERY 10 DAYS 01/25/20   [provider]  cromolyn (OPTICROM) 4 % ophthalmic solution Use 1 drop in each eye up to 4 times a day as needed for red, itchy, or watery eyes 07/11/21   Althea Charon, FNP  empagliflozin (JARDIANCE) 10 MG TABS tablet Take 10 mg by mouth daily. 07/18/20   [provider]  EPINEPHrine (EPIPEN 2-PAK) 0.3 mg/0.3 mL IJ SOAJ injection Use as directed for severe allergic reactions 02/29/20   Bobbitt, Sed[place] Muta, MD  gabapentin (NEURONTIN) 300 MG capsule TAKE 1 CAPSULE BY MOUTH 3 TIMES  DAILY 07/02/21   Marrian Salvage, FNP  glucagon 1 MG injection Inject 1 mg into the muscle once as needed (low blood sugar). 07/30/19   [provider]  glucose 4 GM chewable tablet Chew 1 tablet by mouth once as needed for low blood sugar. 07/30/19   [provider]  HUMULIN N 100 UNIT/ML injection Inject 60 Units into the skin 3 (three) times daily before meals. 06/04/20   [provider]  isosorbide mononitrate (IMDUR) 60 MG 24 hr tablet TAKE 1 TABLET BY MOUTH  DAILY 02/08/21   Adrian Prows, MD  levocetirizine (XYZAL) 5 MG  tablet Can take one tablet every evening if needed. 06/17/21   Dara Hoyer, FNP  lisinopril (ZESTRIL) 20 MG tablet Take 1 tablet (20 mg total) by mouth daily. 01/31/21   Marrian Salvage, FNP  metoprolol tartrate (LOPRESSOR) 50 MG tablet TAKE 1 TABLET BY MOUTH  EVERY 12 HOURS 12/28/20   Adrian Prows, MD  montelukast (SINGULAIR) 10 MG tablet TAKE 1 TABLET(10 MG) BY MOUTH AT BEDTIME 06/14/21   Ambs, Kathrine Cords, FNP  nitroGLYCERIN (NITROSTAT) 0.4 MG SL tablet Place 1 tablet (0.4 mg total) under the tongue every 5 (five) minutes as needed for chest pain. 04/19/21   Adrian Prows, MD  pantoprazole (PROTONIX) 40 MG tablet TAKE 1 TABLET(40 MG) BY MOUTH IN THE MORNING AND AT BEDTIME 02/04/21   Marrian Salvage, FNP  psyllium (METAMUCIL) 58.6 % packet Take 1 packet by mouth daily as needed (regularity).    [provider]  Respiratory Therapy Supplies (CARETOUCH 2 CPAP HOSE HANGER) MISC by Does not apply route.    [provider]  rosuvastatin (CRESTOR) 40 MG tablet Take 1 tablet (40 mg total) by mouth at bedtime. 01/31/21   Marrian Salvage, FNP  Semaglutide, 1 MG/DOSE, (OZEMPIC, 1 MG/DOSE,) 4 MG/3ML SOPN Inject 2 mg into the skin every Sunday. 01/26/20   [provider]  torsemide (DEMADEX) 20 MG tablet TAKE 1 TABLET (20 MG TOTAL) BY  MOUTH DAILY AS NEEDED (FLUID  BUILD UP). 03/26/21   Adrian Prows, MD      Allergies    Contrast media [iodinated contrast media], Morphine, and Statins    Review of Systems   Review of Systems  Physical Exam Updated Vital Signs BP (!) 152/83 (BP Location: Right Arm)   Pulse 76   Temp 98.5 F (36.9 C) (Oral)   Resp 16   Ht 5' 5"  (1.651 m)   Wt 111.1 kg   SpO2 95%   BMI 40.77 kg/m  Physical Exam Vitals and nursing note reviewed.  Constitutional:      General: She is not in acute distress.    Appearance: She is well-developed. She is not diaphoretic.  HENT:     Head: Normocephalic and atraumatic.  Eyes:     Pupils: Pupils are equal, round, and reactive to light.  Cardiovascular:     Rate and Rhythm: Normal rate and regular rhythm.     Heart sounds: No murmur heard.   No friction rub. No gallop.  Pulmonary:     Effort: Pulmonary effort is normal.     Breath sounds: No wheezing or rales.  Abdominal:     General: There is no distension.     Palpations: Abdomen is soft.     Tenderness: There is no abdominal tenderness.  Musculoskeletal:        General: No tenderness.     Cervical back: Normal range of motion and neck supple.     Comments: Pulse motor and sensation intact of the right lower extremity.  No pain with internal and external rotation of the right hip.  Mild pain with compression about the pelvis.  No  midline L-spine tenderness step-offs or deformities.  Mild pain about the coccyx.  Skin:    General: Skin is warm and dry.  Neurological:     Mental Status: She is alert and oriented to person, place, and time.  Psychiatric:        Behavior: Behavior normal.    ED Results / Procedures / Treatments   Labs (all  labs ordered are listed, but only abnormal results are displayed) Labs Reviewed - No data to display  EKG None  Radiology DG Lumbar Spine Complete  Result Date: 07/25/2021 CLINICAL DATA:  [place].  Back pain. EXAM: LUMBAR SPINE - COMPLETE 4+ VIEW COMPARISON:  11/05/2020 FINDINGS: Stable degenerative lumbar spondylosis with severe multilevel facet disease. Disc spaces are fairly well preserved. No acute fractures identified. Stable vascular calcifications. The visualized bony pelvis is intact. IMPRESSION: Stable degenerative lumbar spondylosis with severe multilevel facet disease. No acute bony findings. Electronically Signed   By: Marijo Sanes M.D.   On: 07/25/2021 10:07   CT Head Wo Contrast  Result Date: 07/25/2021 CLINICAL DATA:  [place] EXAM: CT HEAD WITHOUT CONTRAST TECHNIQUE: Contiguous axial images were obtained from the base of the skull through the vertex without intravenous contrast. RADIATION DOSE REDUCTION: This exam was performed according to the departmental dose-optimization program which includes automated exposure control, adjustment of the mA and/or kV according to patient size and/or use of iterative reconstruction technique. COMPARISON:  None Available. FINDINGS: Brain: There is no acute intracranial hemorrhage, extra-axial fluid collection, or acute infarct. Parenchymal volume is normal. The ventricles are normal in size. Gray-white differentiation is preserved. There is no mass lesion.  There is no mass effect or midline shift. Vascular: There is calcification of the bilateral cavernous ICAs. Skull: Normal. Negative for fracture or focal lesion. Sinuses/Orbits: The imaged  paranasal sinuses are clear. The imaged globes and orbits are unremarkable. Other: None. IMPRESSION: No acute intracranial pathology. Electronically Signed   By: Valetta Mole M.D.   On: 07/25/2021 10:17   DG Hip Unilat W or Wo Pelvis 2-3 Views Right  Result Date: 07/25/2021 CLINICAL DATA:  [place].  Right hip pain. EXAM: DG HIP (WITH OR WITHOUT PELVIS) 2-3V RIGHT COMPARISON:  None Available. FINDINGS: Left hip prosthesis is noted. No complicating features. The right hip is intact. No acute fracture. Advanced degenerative changes are noted at the pubic symphysis but no pelvic fractures are identified. IMPRESSION: No acute bony findings. Electronically Signed   By: Marijo Sanes M.D.   On: 07/25/2021 10:32    Procedures Procedures    Medications Ordered in ED Medications  ketorolac (TORADOL) 15 MG/ML injection 15 mg (has no administration in time range)    ED Course/ Medical Decision Making/ A&P                           Medical Decision Making Amount and/or Complexity of Data Reviewed Radiology: ordered.  Risk Prescription drug management.   66 yo F with a chief complaints of right low back pain after a fall.  This happened yesterday evening.  Nonsyncopal by history.  Patient did endorse striking her head is on Plavix.  Will obtain a CT scan of the head.  Plain film of the right hip and low back.  Treat pain here.  Reassess.  Plain film of the right hip independently interpreted by me without fracture or dislocation.  Plain film of the L-spine independently interpreted by me without fracture.  CT scan of the head negative for acute intracranial pathology.  Will discharge home.  PCP follow-up.  10:47 AM:  I have discussed the diagnosis/risks/treatment options with the patient.  Evaluation and diagnostic testing in the emergency department does not suggest an emergent condition requiring admission or immediate intervention beyond what has been performed at this time.  They will follow up with   PCP. We also discussed returning  to the ED immediately if new or worsening sx occur. We discussed the sx which are most concerning (e.g., sudden worsening pain, fever, inability to tolerate by mouth) that necessitate immediate return. Medications administered to the patient during their visit and any new prescriptions provided to the patient are listed below.  Medications given during this visit Medications  ketorolac (TORADOL) 15 MG/ML injection 15 mg (has no administration in time range)     The patient appears reasonably screen and/or stabilized for discharge and I doubt any other medical condition or other [facility] requiring further screening, evaluation, or treatment in the ED at this time prior to discharge.         Final Clinical Impression(s) / ED Diagnoses Final diagnoses:  Acute right-sided low back pain without sciatica    Rx / DC Orders ED Discharge Orders          Ordered    diclofenac Sodium (VOLTAREN) 1 % GEL  4 times daily        07/25/21 Yetter, Harolyn Cocker, DO 07/25/21 1047

## 2021-07-25 NOTE — Discharge Instructions (Signed)
No obvious broken bones on x-ray.  No bleeding into your head.  Please follow-up with your family doctor in the office.  Use the gel as prescribed. Also take tylenol '1000mg'$ (2 extra strength) four times a day.  Please return for numbness or weakness to the legs numbness of the groin difficulty urinating or moving her bowels.

## 2021-07-25 NOTE — ED Notes (Signed)
Patient verbalizes understanding of discharge instructions. Opportunity for questioning and answers were provided. Patient discharged from ED.  °

## 2021-08-19 ENCOUNTER — Ambulatory Visit: Payer: Medicare Other | Admitting: Family

## 2021-09-06 DIAGNOSIS — M47816 Spondylosis without myelopathy or radiculopathy, lumbar region: Secondary | ICD-10-CM | POA: Insufficient documentation

## 2021-09-07 NOTE — Patient Instructions (Incomplete)
Asthma Continue montelukast 10 mg once a day to prevent cough or wheeze Continue Breztri 2 puffs twice a day with a spacer to prevent cough or wheeze Continue albuterol 2 puffs every 4 hours as needed for cough or wheeze OR Instead use albuterol 0.083% solution via nebulizer one unit vial every 4 hours as needed for cough or wheeze Continue to avoid cigarette smoke Continue to consider Fasenra injections in the future if neded  Allergic rhinitis Continue allergen avoidance measures directed toward dust mite, cockroach, weed pollen, cat, and dog Continue allergen immunotherapy and have access to an epinephrine autoinjector set Continue Nasacort 1 to 2 sprays in each nostril once a day as needed for nasal congestion. Increase  azelastine 2 sprays in each nostril up to twice a day as needed for runny nose/drainage down throat Consider saline nasal rinses as needed for nasal symptoms. Use this before any medicated nasal sprays for best result. Increase saline rinses to twice a day for now Continue an antihistamine once a day as needed for runny nose or itch. Remember to rotate to a different antihistamine about every 3 months. Some examples of over the counter antihistamines include Zyrtec (cetirizine), Xyzal (levocetirizine), Allegra (fexofenadine), and Claritin (loratidine).   Allergic conjunctivitis Continue cromolyn 4% eyedrops.  Use 1 drop in each eye up to 4 times a day as needed for red, itchy, or watery eyes  Reflux Continue dietary and lifestyle modifications as listed below Continue pantoprazole as previously prescribed   Follow up in 3 months or sooner if needed.   Lifestyle Changes for Controlling GERD When you have GERD, stomach acid feels as if it's backing up toward your mouth. Whether or not you take medication to control your GERD, your symptoms can often be improved with lifestyle changes.   Raise Your Head Reflux is more likely to strike when you're lying down flat,  because stomach fluid can flow backward more easily. Raising the head of your bed 4-6 inches can help. To do this: Slide blocks or books under the legs at the head of your bed. Or, place a wedge under the mattress. Many foam stores can make a suitable wedge for you. The wedge should run from your waist to the top of your head. Don't just prop your head on several pillows. This increases pressure on your stomach. It can make GERD worse.  Watch Your Eating Habits Certain foods may increase the acid in your stomach or relax the lower esophageal sphincter, making GERD more likely. It's best to avoid the following: Coffee, tea, and carbonated drinks (with and without caffeine) Fatty, fried, or spicy food Mint, chocolate, onions, and tomatoes Any other foods that seem to irritate your stomach or cause you pain  Relieve the Pressure Eat smaller meals, even if you have to eat more often. Don't lie down right after you eat. Wait a few hours for your stomach to empty. Avoid tight belts and tight-fitting clothes. Lose excess weight.  Tobacco and Alcohol Avoid smoking tobacco and drinking alcohol. They can make GERD symptoms worse.  Reducing Pollen Exposure The American Academy of Allergy, Asthma and Immunology suggests the following steps to reduce your exposure to pollen during allergy seasons. Do not hang sheets or clothing out to dry; pollen may collect on these items. Do not mow lawns or spend time around freshly cut grass; mowing stirs up pollen. Keep windows closed at night.  Keep car windows closed while driving. Minimize morning activities outdoors, a time when pollen counts  are usually at their highest. Stay indoors as much as possible when pollen counts or humidity is high and on windy days when pollen tends to remain in the air longer. Use air conditioning when possible.  Many air conditioners have filters that trap the pollen spores. Use a HEPA room air filter to remove pollen form  the indoor air you breathe.   Control of Dust Mite Allergen Dust mites play a major role in allergic asthma and rhinitis. They occur in environments with high humidity wherever human skin is found. Dust mites absorb humidity from the atmosphere (ie, they do not drink) and feed on organic matter (including shed human and animal skin). Dust mites are a microscopic type of insect that you cannot see with the naked eye. High levels of dust mites have been detected from mattresses, pillows, carpets, upholstered furniture, bed covers, clothes, soft toys and any woven material. The principal allergen of the dust mite is found in its feces. A gram of dust may contain 1,000 mites and 250,000 fecal particles. Mite antigen is easily measured in the air during house cleaning activities. Dust mites do not bite and do not cause harm to humans, other than by triggering allergies/asthma.  Ways to decrease your exposure to dust mites in your home:  1. Encase mattresses, box springs and pillows with a mite-impermeable barrier or cover  2. Wash sheets, blankets and drapes weekly in hot water (130 F) with detergent and dry them in a dryer on the hot setting.  3. Have the room cleaned frequently with a vacuum cleaner and a damp dust-mop. For carpeting or rugs, vacuuming with a vacuum cleaner equipped with a high-efficiency particulate air (HEPA) filter. The dust mite allergic individual should not be in a room which is being cleaned and should wait 1 hour after cleaning before going into the room.  4. Do not sleep on upholstered furniture (eg, couches).  5. If possible removing carpeting, upholstered furniture and drapery from the home is ideal. Horizontal blinds should be eliminated in the rooms where the person spends the most time (bedroom, study, television room). Washable vinyl, roller-type shades are optimal.  6. Remove all non-washable stuffed toys from the bedroom. Wash stuffed toys weekly like sheets and  blankets above.  7. Reduce indoor humidity to less than 50%. Inexpensive humidity monitors can be purchased at most hardware stores. Do not use a humidifier as can make the problem worse and are not recommended.  Control of Dog or Cat Allergen Avoidance is the best way to manage a dog or cat allergy. If you have a dog or cat and are allergic to dog or cats, consider removing the dog or cat from the home. If you have a dog or cat but don't want to find it a new home, or if your family wants a pet even though someone in the household is allergic, here are some strategies that may help keep symptoms at bay:  Keep the pet out of your bedroom and restrict it to only a few rooms. Be advised that keeping the dog or cat in only one room will not limit the allergens to that room. Don't pet, hug or kiss the dog or cat; if you do, wash your hands with soap and water. High-efficiency particulate air (HEPA) cleaners run continuously in a bedroom or living room can reduce allergen levels over time. Regular use of a high-efficiency vacuum cleaner or a central vacuum can reduce allergen levels. Giving your dog or cat  a bath at least once a week can reduce airborne allergen.  Control of Cockroach Allergen Cockroach allergen has been identified as an important cause of acute attacks of asthma, especially in urban settings.  There are fifty-five species of cockroach that exist in the [place], however only three, the [place], [place] species produce allergen that can affect patients with Asthma.  Allergens can be obtained from fecal particles, egg casings and secretions from cockroaches.    Remove food sources. Reduce access to water. Seal access and entry points. Spray runways with 0.5-1% Diazinon or Chlorpyrifos Blow boric acid power under stoves and refrigerator. Place bait stations (hydramethylnon) at feeding sites.

## 2021-09-09 ENCOUNTER — Ambulatory Visit (INDEPENDENT_AMBULATORY_CARE_PROVIDER_SITE_OTHER): Payer: Medicare Other | Admitting: Family

## 2021-09-09 ENCOUNTER — Encounter: Payer: Self-pay | Admitting: Family

## 2021-09-09 VITALS — BP 128/72 | HR 74 | Temp 97.9°F | Ht 65.0 in | Wt 248.0 lb

## 2021-09-09 DIAGNOSIS — J309 Allergic rhinitis, unspecified: Secondary | ICD-10-CM | POA: Diagnosis not present

## 2021-09-09 DIAGNOSIS — H1013 Acute atopic conjunctivitis, bilateral: Secondary | ICD-10-CM | POA: Diagnosis not present

## 2021-09-09 DIAGNOSIS — K219 Gastro-esophageal reflux disease without esophagitis: Secondary | ICD-10-CM | POA: Diagnosis not present

## 2021-09-09 DIAGNOSIS — J302 Other seasonal allergic rhinitis: Secondary | ICD-10-CM

## 2021-09-09 DIAGNOSIS — J449 Chronic obstructive pulmonary disease, unspecified: Secondary | ICD-10-CM | POA: Diagnosis not present

## 2021-09-09 NOTE — Progress Notes (Signed)
Katrina Vega [id number] Dept: [phone]  FOLLOW UP NOTE  Patient ID: Katrina Vega Katrina Vega, female    DOB: 1955/08/16  Age: 66 y.o. MRN: [id number] Date of Office Visit: 09/09/2021  Assessment  Chief Complaint: Follow-up (Wants to start shots again, last one was in April; was having some swelling)  HPI Katrina Vega is a 66 year old female who presents today for follow-up of asthma-COPD overlap syndrome, seasonal and perennial allergic rhinoconjunctivitis, and gastroesophageal reflux disease.  She was last seen on June 05, 2021 by myself.  She denies any new diagnosis or surgery since her last office visit.  Asthma-COPD overlap syndrome: She continues to take montelukast 10 mg once a day, Breztri 2 puffs twice a day with spacer, and albuterol as needed.  She reports coughing at night that she feels is due to postnasal drip.  She does report some tightness in her chest and shortness of breath.  She also does have nocturnal awakenings at night because she is not wearing her CPAP.  She reports that she just moved and is going to start wearing it again.  She denies wheezing, fever, and chills.  She uses her albuterol inhaler approximately 4-5 times a month.  She reports that her breathing is affected by different odors or if it is real hot outside.  Since her last office visit she has not required any systemic steroids or made any trips to the emergency room or urgent care due to breathing problems.  She continues to consider Fasenra injections.  Seasonal and perennial allergic rhinitis: She is wanting to start back on allergy injections.  She was not getting allergy injections because she had been sick.  Her last allergy injection was on June 05, 2021.  She continues to use Nasacort nasal spray daily, azelastine 2 sprays each nostril once a day, and Zyrtec 10 mg once a day.  She reports postnasal drip and a little bit of nasal congestion.  She also mentions that last week she had  some rhinorrhea.  She has not had any sinus infections since we last saw her.  Allergic conjunctivitis: She continues to use cromolyn 4% eyedrops as needed.  She reports occasional itchy eyes.  She does feel like these eyedrops help some.  Reflux: She continues to take pantoprazole as prescribed.  She does mention that she will get a burning in her throat if she eats something that she should not have.   Drug Allergies:  Allergies  Allergen Reactions   Contrast Media [Iodinated Contrast Media] Shortness Of Breath and Other (See Comments)    sob, chest tight   Morphine Itching   Statins Diarrhea and Other (See Comments)    nausea, diarrhea, myalgias    Review of Systems: Review of Systems  Constitutional:  Negative for chills and fever.  HENT:         Reports a little bit of nasal congestion right now, postnasal drip, and rhinorrhea last week.  Eyes:        Reports itchy eyes at times.  Cromolyn helps some  Respiratory:  Positive for cough and shortness of breath. Negative for wheezing.        Reports cough at night that she feels is due to postnasal drip.  Also reports some tightness in her chest and shortness of breath.  She also reports nocturnal awakenings, but she is not using her CPAP.  She just moved and is going to start using her CPAP again  Cardiovascular:  Negative  for chest pain and palpitations.  Gastrointestinal:        Reports a burning sensation in her throat if she eats something that she should not have  Genitourinary:  Negative for frequency.  Skin:  Negative for itching and rash.  Neurological:  Negative for headaches.  Endo/Heme/Allergies:  Positive for environmental allergies.     Physical Exam: BP 128/72   Pulse 74   Temp 97.9 F (36.6 C) (Temporal)   Ht '5\' 5"'$  (1.651 m)   Wt 248 lb (112.5 kg)   SpO2 95%   BMI 41.27 kg/m    Physical Exam Constitutional:      Appearance: Normal appearance.  HENT:     Head: Normocephalic and atraumatic.      Comments: Pharynx normal, eyes normal, ears normal, nose: Bilateral lower turbinates moderately edematous and slightly erythematous with no drainage noted    Right Ear: Tympanic membrane, ear canal and external ear normal.     Left Ear: Tympanic membrane, ear canal and external ear normal.     Mouth/Throat:     Mouth: Mucous membranes are moist.     Pharynx: Oropharynx is clear.  Eyes:     Conjunctiva/sclera: Conjunctivae normal.  Cardiovascular:     Rate and Rhythm: Normal rate and regular rhythm.     Heart sounds: Normal heart sounds.  Pulmonary:     Effort: Pulmonary effort is normal.     Breath sounds: Normal breath sounds.     Comments: Lungs clear to auscultation Musculoskeletal:     Cervical back: Neck supple.  Skin:    General: Skin is warm.  Neurological:     Mental Status: She is alert and oriented to person, place, and time.  Psychiatric:        Mood and Affect: Mood normal.        Behavior: Behavior normal.        Thought Content: Thought content normal.        Judgment: Judgment normal.     Diagnostics: None we will get at next office visit.  Assessment and Plan: 1. Asthma-COPD overlap syndrome ([place])   2. Seasonal and perennial allergic rhinitis   3. Allergic conjunctivitis of both eyes   4. Gastroesophageal reflux disease, unspecified whether esophagitis present     No orders of the defined types were placed in this encounter.   Patient Instructions  Asthma Continue montelukast 10 mg once a day to prevent cough or wheeze Continue Breztri 2 puffs twice a day with a spacer to prevent cough or wheeze Continue albuterol 2 puffs every 4 hours as needed for cough or wheeze OR Instead use albuterol 0.083% solution via nebulizer one unit vial every 4 hours as needed for cough or wheeze Continue to avoid cigarette smoke Continue to consider Fasenra injections in the future if neded  Allergic rhinitis Continue allergen avoidance measures directed toward dust  mite, cockroach, weed pollen, cat, and dog Continue allergen immunotherapy and have access to an epinephrine autoinjector set Continue Nasacort 1 to 2 sprays in each nostril once a day as needed for nasal congestion. Increase  azelastine 2 sprays in each nostril up to twice a day as needed for runny nose/drainage down throat Consider saline nasal rinses as needed for nasal symptoms. Use this before any medicated nasal sprays for best result. Increase saline rinses to twice a day for now Continue an antihistamine once a day as needed for runny nose or itch. Remember to rotate to a different antihistamine about  every 3 months. Some examples of over the counter antihistamines include Zyrtec (cetirizine), Xyzal (levocetirizine), Allegra (fexofenadine), and Claritin (loratidine).   Allergic conjunctivitis Continue cromolyn 4% eyedrops.  Use 1 drop in each eye up to 4 times a day as needed for red, itchy, or watery eyes  Reflux Continue dietary and lifestyle modifications as listed below Continue pantoprazole as previously prescribed   Follow up in 3 months or sooner if needed.   Lifestyle Changes for Controlling GERD When you have GERD, stomach acid feels as if it's backing up toward your mouth. Whether or not you take medication to control your GERD, your symptoms can often be improved with lifestyle changes.   Raise Your Head Reflux is more likely to strike when you're lying down flat, because stomach fluid can flow backward more easily. Raising the head of your bed 4-6 inches can help. To do this: Slide blocks or books under the legs at the head of your bed. Or, place a wedge under the mattress. Many foam stores can make a suitable wedge for you. The wedge should run from your waist to the top of your head. Don't just prop your head on several pillows. This increases pressure on your stomach. It can make GERD worse.  Watch Your Eating Habits Certain foods may increase the acid in your  stomach or relax the lower esophageal sphincter, making GERD more likely. It's best to avoid the following: Coffee, tea, and carbonated drinks (with and without caffeine) Fatty, fried, or spicy food Mint, chocolate, onions, and tomatoes Any other foods that seem to irritate your stomach or cause you pain  Relieve the Pressure Eat smaller meals, even if you have to eat more often. Don't lie down right after you eat. Wait a few hours for your stomach to empty. Avoid tight belts and tight-fitting clothes. Lose excess weight.  Tobacco and Alcohol Avoid smoking tobacco and drinking alcohol. They can make GERD symptoms worse.  Reducing Pollen Exposure The American Academy of Allergy, Asthma and Immunology suggests the following steps to reduce your exposure to pollen during allergy seasons. Do not hang sheets or clothing out to dry; pollen may collect on these items. Do not mow lawns or spend time around freshly cut grass; mowing stirs up pollen. Keep windows closed at night.  Keep car windows closed while driving. Minimize morning activities outdoors, a time when pollen counts are usually at their highest. Stay indoors as much as possible when pollen counts or humidity is high and on windy days when pollen tends to remain in the air longer. Use air conditioning when possible.  Many air conditioners have filters that trap the pollen spores. Use a HEPA room air filter to remove pollen form the indoor air you breathe.   Control of Dust Mite Allergen Dust mites play a major role in allergic asthma and rhinitis. They occur in environments with high humidity wherever human skin is found. Dust mites absorb humidity from the atmosphere (ie, they do not drink) and feed on organic matter (including shed human and animal skin). Dust mites are a microscopic type of insect that you cannot see with the naked eye. High levels of dust mites have been detected from mattresses, pillows, carpets, upholstered  furniture, bed covers, clothes, soft toys and any woven material. The principal allergen of the dust mite is found in its feces. A gram of dust may contain 1,000 mites and 250,000 fecal particles. Mite antigen is easily measured in the air during house cleaning  activities. Dust mites do not bite and do not cause harm to humans, other than by triggering allergies/asthma.  Ways to decrease your exposure to dust mites in your home:  1. Encase mattresses, box springs and pillows with a mite-impermeable barrier or cover  2. Wash sheets, blankets and drapes weekly in hot water (130 F) with detergent and dry them in a dryer on the hot setting.  3. Have the room cleaned frequently with a vacuum cleaner and a damp dust-mop. For carpeting or rugs, vacuuming with a vacuum cleaner equipped with a high-efficiency particulate air (HEPA) filter. The dust mite allergic individual should not be in a room which is being cleaned and should wait 1 hour after cleaning before going into the room.  4. Do not sleep on upholstered furniture (eg, couches).  5. If possible removing carpeting, upholstered furniture and drapery from the home is ideal. Horizontal blinds should be eliminated in the rooms where the person spends the most time (bedroom, study, television room). Washable vinyl, roller-type shades are optimal.  6. Remove all non-washable stuffed toys from the bedroom. Wash stuffed toys weekly like sheets and blankets above.  7. Reduce indoor humidity to less than 50%. Inexpensive humidity monitors can be purchased at most hardware stores. Do not use a humidifier as can make the problem worse and are not recommended.  Control of Dog or Cat Allergen Avoidance is the best way to manage a dog or cat allergy. If you have a dog or cat and are allergic to dog or cats, consider removing the dog or cat from the home. If you have a dog or cat but don't want to find it a new home, or if your family wants a pet even though  someone in the household is allergic, here are some strategies that may help keep symptoms at bay:  Keep the pet out of your bedroom and restrict it to only a few rooms. Be advised that keeping the dog or cat in only one room will not limit the allergens to that room. Don't pet, hug or kiss the dog or cat; if you do, wash your hands with soap and water. High-efficiency particulate air (HEPA) cleaners run continuously in a bedroom or living room can reduce allergen levels over time. Regular use of a high-efficiency vacuum cleaner or a central vacuum can reduce allergen levels. Giving your dog or cat a bath at least once a week can reduce airborne allergen.  Control of Cockroach Allergen Cockroach allergen has been identified as an important cause of acute attacks of asthma, especially in urban settings.  There are fifty-five species of cockroach that exist in the [place], however only three, the [place], [place] species produce allergen that can affect patients with Asthma.  Allergens can be obtained from fecal particles, egg casings and secretions from cockroaches.    Remove food sources. Reduce access to water. Seal access and entry points. Spray runways with 0.5-1% Diazinon or Chlorpyrifos Blow boric acid power under stoves and refrigerator. Place bait stations (hydramethylnon) at feeding sites.   Return in about 3 months (around 12/10/2021), or if symptoms worsen or fail to improve.    Thank you for the opportunity to care for this patient.  Please do not hesitate to contact me with questions.  Althea Charon, FNP [facility]

## 2021-09-13 ENCOUNTER — Ambulatory Visit (INDEPENDENT_AMBULATORY_CARE_PROVIDER_SITE_OTHER): Payer: Medicare Other | Admitting: Podiatry

## 2021-09-13 DIAGNOSIS — E1140 Type 2 diabetes mellitus with diabetic neuropathy, unspecified: Secondary | ICD-10-CM

## 2021-09-13 DIAGNOSIS — M722 Plantar fascial fibromatosis: Secondary | ICD-10-CM | POA: Diagnosis not present

## 2021-09-13 DIAGNOSIS — E1149 Type 2 diabetes mellitus with other diabetic neurological complication: Secondary | ICD-10-CM

## 2021-09-13 DIAGNOSIS — L84 Corns and callosities: Secondary | ICD-10-CM | POA: Diagnosis not present

## 2021-09-13 MED ORDER — TRIAMCINOLONE ACETONIDE 10 MG/ML IJ SUSP
10.0000 mg | Freq: Once | INTRAMUSCULAR | Status: AC
  Administered 2021-09-13: 10 mg

## 2021-09-13 NOTE — Progress Notes (Signed)
Subjective:   Patient ID: Katrina Vega, female   DOB: 66 y.o.   MRN: [id number]   HPI Patient presents stating these lesions on both her feet have started to become very tender again her heel has just started to hurt her over the last couple months and she has swelling that is been relatively chronic in her right foot which becomes sore   ROS      Objective:  Physical Exam  Vascular status intact diminished neurological sensation consistent with moderate diabetic neuropathy with keratotic lesions painful subthird metatarsal bilateral and inflammation pain of the plantar heel right with chronic low-grade swelling of the right midfoot and into the ankle with negative Homans' sign noted     Assessment:  Patient with at risk diabetic neuropathy with keratotic lesion bilateral plantar feet painful and acute Planter fasciitis right with chronic low-grade localized swelling     Plan:  H&P reviewed condition and at this point recommended conservative treatment.  Sterile debridement of lesions accomplished no iatrogenic bleeding for the right heel I did sterile prep injected the plantar fascial insertion 3 mg Kenalog 5 mg Xylocaine and I dispensed a ankle compression stocking to help control the swelling of the lower foot and ankle right

## 2021-09-20 ENCOUNTER — Ambulatory Visit (INDEPENDENT_AMBULATORY_CARE_PROVIDER_SITE_OTHER): Payer: Medicare Other | Admitting: *Deleted

## 2021-09-20 ENCOUNTER — Other Ambulatory Visit: Payer: Medicare Other

## 2021-09-20 ENCOUNTER — Ambulatory Visit: Payer: Medicare Other | Admitting: Family

## 2021-09-20 DIAGNOSIS — J309 Allergic rhinitis, unspecified: Secondary | ICD-10-CM | POA: Diagnosis not present

## 2021-09-24 ENCOUNTER — Inpatient Hospital Stay (HOSPITAL_BASED_OUTPATIENT_CLINIC_OR_DEPARTMENT_OTHER): Payer: Medicare Other | Admitting: Family

## 2021-09-24 ENCOUNTER — Inpatient Hospital Stay: Payer: Medicare Other | Attending: Hematology & Oncology

## 2021-09-24 ENCOUNTER — Encounter: Payer: Self-pay | Admitting: Family

## 2021-09-24 VITALS — BP 133/53 | HR 72 | Temp 98.6°F | Resp 18 | Wt 247.8 lb

## 2021-09-24 DIAGNOSIS — D509 Iron deficiency anemia, unspecified: Secondary | ICD-10-CM

## 2021-09-24 LAB — CBC WITH DIFFERENTIAL (CANCER CENTER ONLY)
Abs Immature Granulocytes: 0.06 10*3/uL (ref 0.00–0.07)
Basophils Absolute: 0 10*3/uL (ref 0.0–0.1)
Basophils Relative: 1 %
Eosinophils Absolute: 0.1 10*3/uL (ref 0.0–0.5)
Eosinophils Relative: 2 %
HCT: 42.1 % (ref 36.0–46.0)
Hemoglobin: 13.2 g/dL (ref 12.0–15.0)
Immature Granulocytes: 1 %
Lymphocytes Relative: 40 %
Lymphs Abs: 2.1 10*3/uL (ref 0.7–4.0)
MCH: 26 pg (ref 26.0–34.0)
MCHC: 31.4 g/dL (ref 30.0–36.0)
MCV: 83 fL (ref 80.0–100.0)
Monocytes Absolute: 0.4 10*3/uL (ref 0.1–1.0)
Monocytes Relative: 7 %
Neutro Abs: 2.7 10*3/uL (ref 1.7–7.7)
Neutrophils Relative %: 49 %
Platelet Count: 277 10*3/uL (ref 150–400)
RBC: 5.07 MIL/uL (ref 3.87–5.11)
RDW: 15.8 % — ABNORMAL HIGH (ref 11.5–15.5)
WBC Count: 5.4 10*3/uL (ref 4.0–10.5)
nRBC: 0 % (ref 0.0–0.2)

## 2021-09-24 LAB — RETICULOCYTES
Immature Retic Fract: 13.4 % (ref 2.3–15.9)
RBC.: 5.08 MIL/uL (ref 3.87–5.11)
Retic Count, Absolute: 82.8 10*3/uL (ref 19.0–186.0)
Retic Ct Pct: 1.6 % (ref 0.4–3.1)

## 2021-09-24 LAB — IRON AND IRON BINDING CAPACITY (CC-WL,HP ONLY)
Iron: 71 ug/dL (ref 28–170)
Saturation Ratios: 16 % (ref 10.4–31.8)
TIBC: 451 ug/dL — ABNORMAL HIGH (ref 250–450)
UIBC: 380 ug/dL (ref 148–442)

## 2021-09-24 LAB — FERRITIN: Ferritin: 16 ng/mL (ref 11–307)

## 2021-09-24 NOTE — Progress Notes (Signed)
Hematology and Oncology Follow Up Visit  Katrina Vega [id number] 1955-12-30 65 y.o. 09/24/2021   Principle Diagnosis:  Iron deficiency anemia    Current Therapy:        IV iron as indicated   Interim History:  Katrina Vega is here today for follow-up. She states that she does feel fatigued at times and does not sleep very well at night.  No blood loss noted. No bruising or petechiae.  No fever, chills, n/v, cough, rash, dizziness, SOB, chest pain, palpitations or changes in bladder habits.  She has some abdominal pain after eating and states that she does have issues with constipation. She is taking fiber gummies daily to help.  No swelling or tenderness in her extremities.  She has occasional numbness and tingling in her hands and feet.  No falls or syncope to report.   Appetite and hydration are good. Her weight is stable at 247 lbs.   ECOG Performance Status: 1 - Symptomatic but completely ambulatory  Medications:  Allergies as of 09/24/2021       Reactions   Contrast Media [iodinated Contrast Media] Shortness Of Breath, Other (See Comments)   sob, chest tight   Morphine Itching   Statins Diarrhea, Other (See Comments)   nausea, diarrhea, myalgias        Medication List        Accurate as of September 24, 2021 11:14 AM. If you have any questions, ask your nurse or doctor.          albuterol (2.5 MG/3ML) 0.083% nebulizer solution Commonly known as: PROVENTIL Take 3 mLs (2.5 mg total) by nebulization every 6 (six) hours as needed for wheezing or shortness of breath.   albuterol 108 (90 Base) MCG/ACT inhaler Commonly known as: VENTOLIN HFA Inhale 2 puffs into the lungs every 6 (six) hours as needed for wheezing.   azelastine 0.1 % nasal spray Commonly known as: ASTELIN 1-2 sprays per nostril 2 times daily as needed for runny nose.   BD AutoShield Duo 30G X 5 MM Misc Generic drug: Insulin Pen Needle 1 KIT BY MISC ROUTE 3 TIMES DAILY BEFORE MEALS   Breztri  Aerosphere 160-9-4.8 MCG/ACT Aero Generic drug: Budeson-Glycopyrrol-Formoterol Inhale two puffs twice daily to prevent cough or wheeze.  Rinse, gargle, and spit after use.   CareTouch 2 CPAP Hose Hanger Misc by Does not apply route.   celecoxib 200 MG capsule Commonly known as: CELEBREX Take by mouth.   cetirizine 10 MG tablet Commonly known as: ZYRTEC Take 1 tablet by mouth daily.   Cholecalciferol 50 MCG (2000 UT) Tabs Take by mouth.   clopidogrel 75 MG tablet Commonly known as: PLAVIX Take 1 tablet by mouth daily.   cromolyn 4 % ophthalmic solution Commonly known as: OPTICROM Use 1 drop in each eye up to 4 times a day as needed for red, itchy, or watery eyes   Dexcom G6 Sensor Misc USE 1 SENSOR AS NEEDED CHANGE EVERY 10 DAYS   diclofenac Sodium 1 % Gel Commonly known as: VOLTAREN Apply 4 grams topically 4 (four) times daily.   empagliflozin 10 MG Tabs tablet Commonly known as: JARDIANCE Take 10 mg by mouth daily.   EPINEPHrine 0.3 mg/0.3 mL Soaj injection Commonly known as: EpiPen 2-Pak Use as directed for severe allergic reactions   ferrous fumarate 325 (106 Fe) MG Tabs tablet Commonly known as: HEMOCYTE - 106 mg FE Take by mouth.   gabapentin 300 MG capsule Commonly known as: NEURONTIN TAKE 1 CAPSULE  BY MOUTH 3 TIMES  DAILY   glucagon 1 MG injection Inject 1 mg into the muscle once as needed (low blood sugar).   glucose 4 GM chewable tablet Chew 1 tablet by mouth once as needed for low blood sugar.   HumaLOG 100 UNIT/ML injection Generic drug: insulin lispro Inject into the skin.   HumuLIN N 100 UNIT/ML injection Generic drug: insulin NPH Human Inject 60 Units into the skin 3 (three) times daily before meals.   isosorbide mononitrate 60 MG 24 hr tablet Commonly known as: IMDUR TAKE 1 TABLET BY MOUTH  DAILY   lisinopril 20 MG tablet Commonly known as: ZESTRIL Take 1 tablet (20 mg total) by mouth daily.   metoprolol tartrate 50 MG  tablet Commonly known as: LOPRESSOR TAKE 1 TABLET BY MOUTH  EVERY 12 HOURS   montelukast 10 MG tablet Commonly known as: SINGULAIR TAKE 1 TABLET(10 MG) BY MOUTH AT BEDTIME   nitroGLYCERIN 0.4 MG SL tablet Commonly known as: NITROSTAT Place under the tongue.   nitroGLYCERIN 0.4 MG SL tablet Commonly known as: NITROSTAT Place 1 tablet (0.4 mg total) under the tongue every 5 (five) minutes as needed for chest pain.   Ozempic (2 MG/DOSE) 8 MG/3ML Sopn Generic drug: Semaglutide (2 MG/DOSE) Inject into the skin.   pantoprazole 40 MG tablet Commonly known as: PROTONIX Take by mouth.   psyllium 58.6 % packet Commonly known as: METAMUCIL Take 1 packet by mouth daily as needed (regularity).   rosuvastatin 40 MG tablet Commonly known as: CRESTOR Take 1 tablet (40 mg total) by mouth at bedtime.   tiZANidine 4 MG tablet Commonly known as: ZANAFLEX Take by mouth.   torsemide 20 MG tablet Commonly known as: DEMADEX TAKE 1 TABLET (20 MG TOTAL) BY  MOUTH DAILY AS NEEDED (FLUID  BUILD UP).        Allergies:  Allergies  Allergen Reactions   Contrast Media [Iodinated Contrast Media] Shortness Of Breath and Other (See Comments)    sob, chest tight   Morphine Itching   Statins Diarrhea and Other (See Comments)    nausea, diarrhea, myalgias    Past Medical History, Surgical history, Social history, and Family History were reviewed and updated.  Review of Systems: All other 10 point review of systems is negative.   Physical Exam:  weight is 247 lb 12.8 oz (112.4 kg). Her oral temperature is 98.6 F (37 C). Her blood pressure is 133/53 (abnormal) and her pulse is 72. Her respiration is 18 and oxygen saturation is 96%.   Wt Readings from Last 3 Encounters:  09/24/21 247 lb 12.8 oz (112.4 kg)  09/09/21 248 lb (112.5 kg)  07/25/21 245 lb (111.1 kg)    Ocular: Sclerae unicteric, pupils equal, round and reactive to light Ear-nose-throat: Oropharynx clear, dentition  fair Lymphatic: No cervical or supraclavicular adenopathy Lungs no rales or rhonchi, good excursion bilaterally Heart regular rate and rhythm, no murmur appreciated Abd soft, nontender, positive bowel sounds MSK no focal spinal tenderness, no joint edema Neuro: non-focal, well-oriented, appropriate affect Breasts: Deferred   Lab Results  Component Value Date   WBC 5.4 09/24/2021   HGB 13.2 09/24/2021   HCT 42.1 09/24/2021   MCV 83.0 09/24/2021   PLT 277 09/24/2021   Lab Results  Component Value Date   FERRITIN 25 05/22/2021   IRON 64 05/22/2021   TIBC 438 05/22/2021   UIBC 374 05/22/2021   IRONPCTSAT 15 05/22/2021   Lab Results  Component Value Date   RETICCTPCT  1.6 09/24/2021   RBC 5.07 09/24/2021   RBC 5.08 09/24/2021   No results found for: "KPAFRELGTCHN", "LAMBDASER", "KAPLAMBRATIO" No results found for: "IGGSERUM", "IGA", "IGMSERUM" No results found for: "TOTALPROTELP", "ALBUMINELP", "A1GS", "A2GS", "BETS", "BETA2SER", "GAMS", "MSPIKE", "SPEI"   Chemistry      Component Value Date/Time   NA 140 02/12/2021 0849   K 3.9 02/12/2021 0849   CL 106 02/12/2021 0849   CO2 24 02/12/2021 0849   BUN 15 02/12/2021 0849   CREATININE 0.99 02/12/2021 0849      Component Value Date/Time   CALCIUM 9.4 02/12/2021 0849   ALKPHOS 57 02/12/2021 0849   AST 13 (L) 02/12/2021 0849   ALT 13 02/12/2021 0849   BILITOT 0.5 02/12/2021 0849       Impression and Plan: Katrina Vega is a very pleasant 66 yo African American female with long history of iron deficiency anemia.  Iron studies are pending.  Follow-up in 4 months.   Lottie Dawson, NP 8/1/202311:14 AM

## 2021-09-26 ENCOUNTER — Telehealth: Payer: Self-pay

## 2021-09-26 NOTE — Patient Outreach (Signed)
  Care Coordination   9/0/2284 Name: Katrina Vega MRN: [id number] DOB: [birth year]   Care Coordination Outreach Attempts:  An unsuccessful telephone outreach was attempted today to offer the patient information about available care coordination services as a benefit of their health plan.   Follow Up Plan:  Additional outreach attempts will be made to offer the patient care coordination information and services.   Encounter Outcome:  No Answer  Care Coordination Interventions Activated:  No   Care Coordination Interventions:  No, not indicated    Thea Silversmith, RN, MSN, BSN, CCM Care Coordinator [phone]

## 2021-10-04 ENCOUNTER — Ambulatory Visit (INDEPENDENT_AMBULATORY_CARE_PROVIDER_SITE_OTHER): Payer: Medicare Other

## 2021-10-04 DIAGNOSIS — J309 Allergic rhinitis, unspecified: Secondary | ICD-10-CM

## 2021-10-05 ENCOUNTER — Other Ambulatory Visit: Payer: Self-pay | Admitting: Cardiology

## 2021-10-05 ENCOUNTER — Other Ambulatory Visit: Payer: Self-pay | Admitting: Family

## 2021-10-10 ENCOUNTER — Ambulatory Visit (INDEPENDENT_AMBULATORY_CARE_PROVIDER_SITE_OTHER): Payer: Medicare Other | Admitting: *Deleted

## 2021-10-10 DIAGNOSIS — J309 Allergic rhinitis, unspecified: Secondary | ICD-10-CM

## 2021-10-15 DIAGNOSIS — E1165 Type 2 diabetes mellitus with hyperglycemia: Secondary | ICD-10-CM | POA: Diagnosis not present

## 2021-10-15 DIAGNOSIS — Z794 Long term (current) use of insulin: Secondary | ICD-10-CM | POA: Diagnosis not present

## 2021-10-17 ENCOUNTER — Ambulatory Visit (INDEPENDENT_AMBULATORY_CARE_PROVIDER_SITE_OTHER): Payer: Medicare Other

## 2021-10-17 ENCOUNTER — Ambulatory Visit: Payer: Medicare Other | Admitting: Cardiology

## 2021-10-17 ENCOUNTER — Encounter: Payer: Self-pay | Admitting: Cardiology

## 2021-10-17 VITALS — BP 128/68 | HR 72 | Temp 97.8°F | Resp 16 | Ht 65.0 in | Wt 251.8 lb

## 2021-10-17 DIAGNOSIS — E1122 Type 2 diabetes mellitus with diabetic chronic kidney disease: Secondary | ICD-10-CM | POA: Diagnosis not present

## 2021-10-17 DIAGNOSIS — N1831 Chronic kidney disease, stage 3a: Secondary | ICD-10-CM | POA: Diagnosis not present

## 2021-10-17 DIAGNOSIS — I25118 Atherosclerotic heart disease of native coronary artery with other forms of angina pectoris: Secondary | ICD-10-CM | POA: Diagnosis not present

## 2021-10-17 DIAGNOSIS — I10 Essential (primary) hypertension: Secondary | ICD-10-CM | POA: Diagnosis not present

## 2021-10-17 DIAGNOSIS — E7800 Pure hypercholesterolemia, unspecified: Secondary | ICD-10-CM

## 2021-10-17 DIAGNOSIS — Z794 Long term (current) use of insulin: Secondary | ICD-10-CM

## 2021-10-17 DIAGNOSIS — J309 Allergic rhinitis, unspecified: Secondary | ICD-10-CM | POA: Diagnosis not present

## 2021-10-17 MED ORDER — CLOPIDOGREL BISULFATE 75 MG PO TABS: 75.0000 mg | ORAL_TABLET | Freq: Every day | ORAL | 3 refills | Status: DC

## 2021-10-17 MED ORDER — ROSUVASTATIN CALCIUM 40 MG PO TABS: 40.0000 mg | ORAL_TABLET | Freq: Every day | ORAL | 3 refills | Status: DC

## 2021-10-17 MED ORDER — LISINOPRIL 20 MG PO TABS: 20.0000 mg | ORAL_TABLET | Freq: Every day | ORAL | 3 refills | Status: DC

## 2021-10-17 MED ORDER — METOPROLOL TARTRATE 50 MG PO TABS: 50.0000 mg | ORAL_TABLET | Freq: Two times a day (BID) | ORAL | 3 refills | Status: DC

## 2021-10-17 MED ORDER — ISOSORBIDE MONONITRATE ER 60 MG PO TB24: 60.0000 mg | ORAL_TABLET | Freq: Every day | ORAL | 3 refills | Status: DC

## 2021-10-17 NOTE — Progress Notes (Signed)
Primary Physician/Referring:  Marrian Salvage, FNP  Patient ID: Katrina Vega, female    DOB: Jun 27, 1955, 66 y.o.   MRN: [id number]  No chief complaint on file.   HPI:    Katrina Vega  is a 66 y.o. Female patient with coronary artery disease and history of PCI to the LAD and  ramus in 2008 with implantation of Taxus stents, NSTEMI IN 2013 SP BALLOON ANGIOPLASTY TO SMALL RCA , chronic diastolic heart failure, hyperlipidemia, hypertension, diabetes mellitus with stage III chronic kidney disease, morbid obesity, OSA unable to tolerate CPAP,  peripheral arterial disease with right posterior tibial DES stent placed in 2008 and bilateral SFA stenting sometime in 2016 and atherectomy for ISR in 2019.  She presents for a 39-monthoffice visit.  She has been doing well and has not had any recurrence of chest pain, dyspnea, PND or orthopnea.  She denies any symptoms suggestive of claudication.  No PND or orthopnea, no leg edema.     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  Musculoskeletal:  Positive for back pain.  Gastrointestinal:  Negative for melena.   Objective  There were no vitals taken for this visit.     09/24/2021   10:53 AM 09/09/2021   10:09 AM 07/25/2021   11:00 AM  Vitals with BMI  Height  5' 5"    Weight 247 lbs 13 oz 248  lbs   BMI 424.40410.27  Systolic [id number] Diastolic 53 72 68  Pulse 72 74 60     Physical Exam Constitutional:      Appearance: She is morbidly obese.     Comments: Morbidly obese in no acute distress.  Neck:     Vascular: No carotid bruit or JVD.  Cardiovascular:     Rate and Rhythm: Normal rate and regular rhythm.     Pulses:          Carotid pulses are 2+ on the right side and 2+ on the left side.      Radial pulses are 2+ on the right side and 2+ on the left side.       Femoral pulses are 1+ on the right side and 1+ on the left side.      Dorsalis pedis pulses are 0 on the right side and 0 on the left side.       Posterior tibial pulses are 0 on the right side and 0 on the left side.     Heart sounds: Normal heart sounds. No murmur heard.    No gallop.  Pulmonary:     Effort: Pulmonary effort is normal.     Breath sounds: Normal breath sounds.  Abdominal:     General: Bowel sounds are normal.     Palpations: Abdomen is soft.     Comments: Obese. Pannus present  Musculoskeletal:     Right lower leg: No edema.     Left lower leg: No edema.    Laboratory examination:   Lipid Panel     Component Value Date/Time   CHOL 110 01/31/2021 1118   TRIG 66.0 01/31/2021 1118   HDL 36.70 (L) 01/31/2021 1118   CHOLHDL 3 01/31/2021 1118   VLDL 13.2 01/31/2021 1118   LDLCALC 60 01/31/2021 1118   HEMOGLOBIN A1C Lab Results  Component Value Date   HGBA1C 7.2 04/25/2021    External labs:   Labs 03/06/2021:  A1c 7.7%.  Urinary protein 100 mg/dL.  Labs 10/10/2020:  Sodium 141, potassium 4.4, BUN 25, creatinine 1.33, EGFR 44 mL.  Vitamin D 08/02/2020: Markedly reduced at 15.  Medications and allergies   Allergies  Allergen Reactions   Contrast Media [Iodinated Contrast Media] Shortness Of Breath and Other (See Comments)    sob, chest tight   Morphine Itching   Statins Diarrhea and Other (See Comments)    nausea, diarrhea, myalgias     Current Outpatient  Medications:    albuterol (PROVENTIL) (2.5 MG/3ML) 0.083% nebulizer solution, Take 3 mLs (2.5 mg total) by nebulization every 6 (six) hours as needed for wheezing or shortness of breath., Disp: 75 mL, Rfl: 2   albuterol (VENTOLIN HFA) 108 (90 Base) MCG/ACT inhaler, Inhale 2 puffs into the lungs every 6 (six) hours as needed for wheezing., Disp: 18 g, Rfl: 1   azelastine (ASTELIN) 0.1 % nasal spray, 1-2 sprays per nostril 2 times daily as needed for runny nose., Disp: 90 mL, Rfl: 2   BD AUTOSHIELD DUO 30G X 5 MM MISC, 1 KIT BY MISC ROUTE 3 TIMES DAILY BEFORE MEALS, Disp: , Rfl:    Budeson-Glycopyrrol-Formoterol (BREZTRI AEROSPHERE) 160-9-4.8 MCG/ACT AERO, Inhale two puffs twice daily to prevent cough or wheeze.  Rinse, gargle, and spit after use., Disp: 32.1 g, Rfl: 1   celecoxib (CELEBREX) 200 MG capsule, Take by mouth., Disp: , Rfl:    cetirizine (ZYRTEC) 10 MG tablet, Take 1 tablet by mouth daily., Disp: , Rfl:    Cholecalciferol 50 MCG (2000 UT) TABS, Take by mouth., Disp: , Rfl:    clopidogrel (PLAVIX) 75 MG tablet, Take 1 tablet by mouth daily., Disp: , Rfl:    Continuous Blood Gluc Sensor (DEXCOM G6 SENSOR) MISC, USE 1 SENSOR AS NEEDED CHANGE EVERY 10 DAYS, Disp: , Rfl:    cromolyn (OPTICROM) 4 % ophthalmic solution, Use 1 drop in each eye up to 4 times a day as needed for red, itchy, or watery eyes, Disp: 30 mL, Rfl: 1   diclofenac Sodium (VOLTAREN) 1 % GEL, Apply 4 grams topically 4 (four) times daily., Disp: 100 g, Rfl: 0   empagliflozin (JARDIANCE) 10 MG TABS tablet, Take 10 mg by mouth daily., Disp: , Rfl:    EPINEPHrine (EPIPEN 2-PAK) 0.3 mg/0.3 mL IJ SOAJ injection, Use as directed for severe allergic reactions, Disp: 2 each, Rfl: 3   ferrous fumarate (HEMOCYTE - 106 MG FE) 325 (106 Fe) MG TABS tablet, Take by mouth., Disp: , Rfl:    gabapentin (NEURONTIN) 300 MG capsule, TAKE 1 CAPSULE BY MOUTH 3 TIMES  DAILY, Disp: 270 capsule, Rfl: 3   glucagon 1 MG injection, Inject 1 mg into the  muscle once as needed (low blood sugar)., Disp: , Rfl:  glucose 4 GM chewable tablet, Chew 1 tablet by mouth once as needed for low blood sugar., Disp: , Rfl:    HUMALOG 100 UNIT/ML injection, Inject into the skin., Disp: , Rfl:    HUMULIN N 100 UNIT/ML injection, Inject 60 Units into the skin 3 (three) times daily before meals., Disp: , Rfl:    isosorbide mononitrate (IMDUR) 60 MG 24 hr tablet, TAKE 1 TABLET BY MOUTH  DAILY, Disp: 90 tablet, Rfl: 3   lisinopril (ZESTRIL) 20 MG tablet, Take 1 tablet (20 mg total) by mouth daily., Disp: 90 tablet, Rfl: 3   metoprolol tartrate (LOPRESSOR) 50 MG tablet, TAKE 1 TABLET BY MOUTH EVERY 12  HOURS, Disp: 200 tablet, Rfl: 2   montelukast (SINGULAIR) 10 MG tablet, TAKE 1 TABLET(10 MG) BY MOUTH AT BEDTIME, Disp: 90 tablet, Rfl: 1   nitroGLYCERIN (NITROSTAT) 0.4 MG SL tablet, Place 1 tablet (0.4 mg total) under the tongue every 5 (five) minutes as needed for chest pain., Disp: 25 tablet, Rfl: 3   nitroGLYCERIN (NITROSTAT) 0.4 MG SL tablet, Place under the tongue., Disp: , Rfl:    OZEMPIC, 2 MG/DOSE, 8 MG/3ML SOPN, Inject into the skin., Disp: , Rfl:    pantoprazole (PROTONIX) 40 MG tablet, TAKE 1 TABLET BY MOUTH IN THE  MORNING AND AT BEDTIME, Disp: 200 tablet, Rfl: 2   psyllium (METAMUCIL) 58.6 % packet, Take 1 packet by mouth daily as needed (regularity)., Disp: , Rfl:    Respiratory Therapy Supplies (CARETOUCH 2 CPAP HOSE HANGER) MISC, by Does not apply route., Disp: , Rfl:    rosuvastatin (CRESTOR) 40 MG tablet, Take 1 tablet (40 mg total) by mouth at bedtime., Disp: 30 tablet, Rfl: 3   tiZANidine (ZANAFLEX) 4 MG tablet, Take by mouth., Disp: , Rfl:    torsemide (DEMADEX) 20 MG tablet, TAKE 1 TABLET (20 MG TOTAL) BY  MOUTH DAILY AS NEEDED (FLUID  BUILD UP)., Disp: 90 tablet, Rfl: 3    Radiology:   No results found.  Cardiac Studies:   Coronary angioplasty: -RCA PCI (Dr. Tonye Becket) 10/2011 for NSTEMI; LAD/Ramus DES 2008 Taxus;  Coronary angiogram  12/22/2017: A- Left Main: Mild plaquing, no critical lesions seen.  B- LAD: The LAD contains mild plaquing proximally and gives rise to a large high lying 1st diagonal branch in which a 40% ostial stenosis is present. Mid LAD contains a well deployed stent which is patent (2008 Taxus), no evidence for significant restenoses. The distal LAD is very large and free of significant disease.  C- Left Circumflex: Limited views of the left circumflex were obtained due to dye issues. There appears to be a proximal left circumflex and 1st obtuse marginal stented segments which are patent (Taxus 2008) and show no it evidence for significant restenoses.  D- RCA: The RCA is small and contains a patent proximal stent with 50 to 60% mid stenosis and mild plaquing noted distally.  PTCA site from 2013 is patent.  Peripheral arteriogram 12/22/2017:  There are bilateral SFA stents present with evidence for severe right and mild left in stent restenoses present. The bilateral popliteal arteries and proximal trifurcation vessels are patent. However, there may be moderate to severe lesion involving the mid right anterior tibial artery in particular. In addition, there was poor flow noted in the left anterior tibial artery with 2 vessel runoff probably present bilaterally. Laser atherectomy of right SFA followed by DCB with 5.0 x 120 mm impact.  Echocardiogram 07/04/2019: Normal LV size, mild LVH, hyperdynamic LVEF at >  65% with grade 1 diastolic dysfunction.  Otherwise no other significant abnormality.  Poor quality due to patient body habitus.  Lexiscan nuclear stress test 07/07/2019: Nondiagnostic EKG.  Dyspnea during stress test. No evidence of ischemia.  Soft tissue attenuation noted.  LVEF 72%.  Normal wall motion.  Low risk study.  No significant change from 12/20/2017.  Lower extremity venous duplex 07/02/2019: No evidence of DVT in bilateral lower extremity.  Lower Extremity Arterial Duplex 10/09/2020: Right mid  and distal SFA occluded, dampened flow in the popliteal artery via collaterals. Moderate velocity increase at the right profunda femoral artery.  No hemodynamically significant stenoses are identified in the left lower extremity arterial system. This exam reveals normal perfusion of the right lower extremity (ABI 0.97) with monophasic waveform in the PT This exam reveals mildly decreased perfusion of the left lower extremity, noted at the post tibial artery level (ABI 0.90) with monophasic waveform in the left AT.  Compared to 12/16/2019, right SFA stenosis is new.  No significant change in ABI.  Abdominal Aortic Duplex 10/09/2020: The maximum aorta (sac) diameter is 2.51 cm (prox) with mild ectasia in the proximal aorta. Diffuse plaque observed in the proximal, mid and distal aorta.  Bilateral CIA are not well visualized but appear to be severely stenosed with diffuse plaque, however, the flow velocity appears normal.  Clinical correlation recommended. Consider CTA or catheter directed angiography if clinically indicated.   EKG:   EKG 10/17/2021.  Normal sinus rhythm at rate of 72 bpm, normal axis, no evidence of ischemia, normal EKG. No significant change from 04/19/2021.   Assessment   No diagnosis found.   No orders of the defined types were placed in this encounter.   No orders of the defined types were placed in this encounter.  Recommendations:   Katrina Vega is a 66 y.o. Female patient with coronary artery disease and history of PCI to the LAD and  ramus in 2008 with implantation of Taxus stents, NSTEMI IN 2013 SP BALLOON ANGIOPLASTY TO SMALL RCA , chronic diastolic heart failure, hyperlipidemia, hypertension, diabetes mellitus with stage III chronic kidney disease, morbid obesity, OSA unable to tolerate CPAP,  peripheral arterial disease with right posterior tibial DES stent placed in 2008 and bilateral SFA stenting sometime in 2016 and atherectomy for ISR in 2019.  She presents  for a 8-monthoffice visit.  She has been doing well and has not had any recurrence of chest pain, dyspnea, PND or orthopnea.  She denies any symptoms suggestive of claudication.  Vascular examination reveals improved pedal pulses.  Right, has absent pulses.  Pedal on the left but no clinical evidence of critical limb ischemia.  Her diabetes is not improving, she is presently on a insulin pump.  She is also on Jardiance and since then she has not had any acute diastolic heart failure and she has discontinued taking torsemide.  I discussed with her regarding use of Ozempic, advised her to eat only half her meals and wait for at least 15 to 20 minutes prior to going back for the second which will help with weight loss and also trying to get in the water/water aerobics to improve her metabolism which would help with weight loss as well.  Otherwise she is on appropriate medical therapy, she is on Plavix for CAD and PAD and off of aspirin, blood pressure is well controlled and lipids are at goal.  No changes in the medications were done today, I will see her back in  6 months for follow-up.  I spent 40 minutes on patient encounter, I reviewed her external labs, I reviewed her medications, complexity of medical care including coronary disease and peripheral arterial disease and obesity discussed in detail.  Diet counseling and exercise counseling was also performed today.   Adrian Prows, MD, [facility] 10/17/2021, 7:54 AM Office: [phone]

## 2021-10-24 DIAGNOSIS — M9902 Segmental and somatic dysfunction of thoracic region: Secondary | ICD-10-CM | POA: Diagnosis not present

## 2021-10-24 DIAGNOSIS — M9903 Segmental and somatic dysfunction of lumbar region: Secondary | ICD-10-CM | POA: Diagnosis not present

## 2021-10-24 DIAGNOSIS — J3081 Allergic rhinitis due to animal (cat) (dog) hair and dander: Secondary | ICD-10-CM | POA: Diagnosis not present

## 2021-10-24 NOTE — Progress Notes (Signed)
VIALS EXP 10-25-22

## 2021-10-25 DIAGNOSIS — J3089 Other allergic rhinitis: Secondary | ICD-10-CM | POA: Diagnosis not present

## 2021-10-25 DIAGNOSIS — G4733 Obstructive sleep apnea (adult) (pediatric): Secondary | ICD-10-CM | POA: Diagnosis not present

## 2021-10-29 ENCOUNTER — Ambulatory Visit (INDEPENDENT_AMBULATORY_CARE_PROVIDER_SITE_OTHER): Payer: Medicare Other

## 2021-10-29 DIAGNOSIS — J309 Allergic rhinitis, unspecified: Secondary | ICD-10-CM | POA: Diagnosis not present

## 2021-10-29 DIAGNOSIS — M9902 Segmental and somatic dysfunction of thoracic region: Secondary | ICD-10-CM | POA: Diagnosis not present

## 2021-10-29 DIAGNOSIS — M9903 Segmental and somatic dysfunction of lumbar region: Secondary | ICD-10-CM | POA: Diagnosis not present

## 2021-11-04 ENCOUNTER — Ambulatory Visit: Payer: Medicare Other | Admitting: Family Medicine

## 2021-11-04 ENCOUNTER — Telehealth: Payer: Self-pay | Admitting: Family

## 2021-11-04 DIAGNOSIS — E1122 Type 2 diabetes mellitus with diabetic chronic kidney disease: Secondary | ICD-10-CM | POA: Diagnosis not present

## 2021-11-04 DIAGNOSIS — Z794 Long term (current) use of insulin: Secondary | ICD-10-CM | POA: Diagnosis not present

## 2021-11-04 DIAGNOSIS — N182 Chronic kidney disease, stage 2 (mild): Secondary | ICD-10-CM | POA: Diagnosis not present

## 2021-11-04 DIAGNOSIS — E1165 Type 2 diabetes mellitus with hyperglycemia: Secondary | ICD-10-CM | POA: Diagnosis not present

## 2021-11-04 DIAGNOSIS — Z7984 Long term (current) use of oral hypoglycemic drugs: Secondary | ICD-10-CM | POA: Diagnosis not present

## 2021-11-04 DIAGNOSIS — Z7985 Long-term (current) use of injectable non-insulin antidiabetic drugs: Secondary | ICD-10-CM | POA: Diagnosis not present

## 2021-11-04 DIAGNOSIS — E1140 Type 2 diabetes mellitus with diabetic neuropathy, unspecified: Secondary | ICD-10-CM | POA: Diagnosis not present

## 2021-11-04 DIAGNOSIS — I129 Hypertensive chronic kidney disease with stage 1 through stage 4 chronic kidney disease, or unspecified chronic kidney disease: Secondary | ICD-10-CM | POA: Diagnosis not present

## 2021-11-04 DIAGNOSIS — Z9641 Presence of insulin pump (external) (internal): Secondary | ICD-10-CM | POA: Diagnosis not present

## 2021-11-04 DIAGNOSIS — E559 Vitamin D deficiency, unspecified: Secondary | ICD-10-CM | POA: Diagnosis not present

## 2021-11-04 NOTE — Telephone Encounter (Signed)
error 

## 2021-11-04 NOTE — Telephone Encounter (Signed)
Pt was a no show for her OV with Dr. Ethelene Hal on 11/04/21, I sent a letter.

## 2021-11-05 ENCOUNTER — Ambulatory Visit: Payer: Medicare Other | Admitting: Family

## 2021-11-05 ENCOUNTER — Encounter: Payer: Self-pay | Admitting: Family Medicine

## 2021-11-05 ENCOUNTER — Ambulatory Visit (INDEPENDENT_AMBULATORY_CARE_PROVIDER_SITE_OTHER): Payer: Medicare Other | Admitting: Family Medicine

## 2021-11-05 VITALS — BP 120/79 | HR 84 | Temp 97.4°F | Ht 65.0 in | Wt 250.4 lb

## 2021-11-05 DIAGNOSIS — J4520 Mild intermittent asthma, uncomplicated: Secondary | ICD-10-CM

## 2021-11-05 DIAGNOSIS — J040 Acute laryngitis: Secondary | ICD-10-CM | POA: Diagnosis not present

## 2021-11-05 DIAGNOSIS — B349 Viral infection, unspecified: Secondary | ICD-10-CM | POA: Diagnosis not present

## 2021-11-05 LAB — POC COVID19 BINAXNOW: SARS Coronavirus 2 Ag: NEGATIVE

## 2021-11-05 MED ORDER — BENZONATATE 200 MG PO CAPS: 200.0000 mg | ORAL_CAPSULE | Freq: Two times a day (BID) | ORAL | 0 refills | Status: DC | PRN

## 2021-11-05 MED ORDER — PREDNISONE 10 MG PO TABS: 10.0000 mg | ORAL_TABLET | Freq: Two times a day (BID) | ORAL | 0 refills | Status: AC

## 2021-11-05 NOTE — Progress Notes (Signed)
Established Patient Office Visit  Subjective   Patient ID: LIANETTE BROUSSARD, female    DOB: [birth year]  Age: 66 y.o. MRN: [id number]  Chief Complaint  Patient presents with   Acute Visit    Pt c/o sinus drainage, earache, sore throat, coughing , soar chest from coughing x 5 days. Benadryl for symptoms.    HPI presents with a 4-day history of headache, ear congestion and pain, rhinorrhea, nasal congestion, postnasal drip, sore throat, dry cough with increased wheezing.  History of asthma with COPD.     Review of Systems  Constitutional: Negative.   HENT:  Positive for congestion, ear pain and sore throat.   Eyes:  Negative for blurred vision, discharge and redness.  Respiratory:  Positive for cough and wheezing. Negative for sputum production and shortness of breath.   Cardiovascular: Negative.   Gastrointestinal:  Negative for abdominal pain.  Genitourinary: Negative.   Musculoskeletal: Negative.  Negative for joint pain and myalgias.  Skin:  Negative for rash.  Neurological:  Positive for headaches. Negative for tingling, loss of consciousness and weakness.  Endo/Heme/Allergies:  Negative for polydipsia.      Objective:     BP 120/79 (BP Location: Left Arm, Patient Position: Sitting, Cuff Size: Normal)   Pulse 84   Temp (!) 97.4 F (36.3 C) (Temporal)   Ht '5\' 5"'$  (1.651 m)   Wt 250 lb 6.4 oz (113.6 kg)   SpO2 95%   BMI 41.67 kg/m    Physical Exam Constitutional:      General: She is not in acute distress.    Appearance: Normal appearance. She is not ill-appearing, toxic-appearing or diaphoretic.  HENT:     Head: Normocephalic and atraumatic.     Right Ear: External ear normal.     Left Ear: External ear normal.     Mouth/Throat:     Mouth: Mucous membranes are moist.     Pharynx: Oropharynx is clear. No oropharyngeal exudate or posterior oropharyngeal erythema.  Eyes:     General: No scleral icterus.       Right eye: No discharge.        Left eye: No  discharge.     Extraocular Movements: Extraocular movements intact.     Conjunctiva/sclera: Conjunctivae normal.     Pupils: Pupils are equal, round, and reactive to light.  Cardiovascular:     Rate and Rhythm: Normal rate and regular rhythm.  Pulmonary:     Effort: Pulmonary effort is normal. No respiratory distress.     Breath sounds: Normal breath sounds. No wheezing or rales.  Abdominal:     General: Bowel sounds are normal.     Tenderness: There is no abdominal tenderness. There is no guarding.  Musculoskeletal:     Cervical back: No rigidity or tenderness.  Lymphadenopathy:     Cervical: No cervical adenopathy.  Skin:    General: Skin is warm and dry.  Neurological:     Mental Status: She is alert and oriented to person, place, and time.  Psychiatric:        Mood and Affect: Mood normal.        Behavior: Behavior normal.      Results for orders placed or performed in visit on 11/05/21  POC COVID-19  Result Value Ref Range   SARS Coronavirus 2 Ag Negative Negative      The ASCVD Risk score (Arnett DK, et al., 2019) failed to calculate for the following reasons:   The valid total  cholesterol range is 130 to 320 mg/dL    Assessment & Plan:   Problem List Items Addressed This Visit       Respiratory   Reactive airway disease   Relevant Medications   predniSONE (DELTASONE) 10 MG tablet   benzonatate (TESSALON) 200 MG capsule     Other   Viral syndrome - Primary   Relevant Medications   benzonatate (TESSALON) 200 MG capsule   Other Relevant Orders   POC COVID-19 (Completed)   Other Visit Diagnoses     Laryngitis           Return Follow-up with primary care physician in 1 week if not improving..  Lower dose prednisone burst to help with wheezing and tightness in the chest as well as laryngitis.  Tessalon for cough.  Test was negative.  Libby Maw, MD

## 2021-11-06 NOTE — Telephone Encounter (Signed)
Pt of [facility]. Katrina Vega please be advised of no show for acute visit with Dr. Ethelene Hal 9/11. Pt did come in on 9/12 to see Dr. Ethelene Hal.

## 2021-11-12 DIAGNOSIS — M9902 Segmental and somatic dysfunction of thoracic region: Secondary | ICD-10-CM | POA: Diagnosis not present

## 2021-11-12 DIAGNOSIS — M9903 Segmental and somatic dysfunction of lumbar region: Secondary | ICD-10-CM | POA: Diagnosis not present

## 2021-11-18 DIAGNOSIS — M9903 Segmental and somatic dysfunction of lumbar region: Secondary | ICD-10-CM | POA: Diagnosis not present

## 2021-11-18 DIAGNOSIS — M9902 Segmental and somatic dysfunction of thoracic region: Secondary | ICD-10-CM | POA: Diagnosis not present

## 2021-11-20 DIAGNOSIS — R809 Proteinuria, unspecified: Secondary | ICD-10-CM | POA: Diagnosis not present

## 2021-11-20 DIAGNOSIS — E1122 Type 2 diabetes mellitus with diabetic chronic kidney disease: Secondary | ICD-10-CM | POA: Diagnosis not present

## 2021-11-20 DIAGNOSIS — E559 Vitamin D deficiency, unspecified: Secondary | ICD-10-CM | POA: Diagnosis not present

## 2021-11-20 DIAGNOSIS — M9903 Segmental and somatic dysfunction of lumbar region: Secondary | ICD-10-CM | POA: Diagnosis not present

## 2021-11-20 DIAGNOSIS — N1831 Chronic kidney disease, stage 3a: Secondary | ICD-10-CM | POA: Diagnosis not present

## 2021-11-20 DIAGNOSIS — I129 Hypertensive chronic kidney disease with stage 1 through stage 4 chronic kidney disease, or unspecified chronic kidney disease: Secondary | ICD-10-CM | POA: Diagnosis not present

## 2021-11-20 DIAGNOSIS — N183 Chronic kidney disease, stage 3 unspecified: Secondary | ICD-10-CM | POA: Diagnosis not present

## 2021-11-20 DIAGNOSIS — M9902 Segmental and somatic dysfunction of thoracic region: Secondary | ICD-10-CM | POA: Diagnosis not present

## 2021-11-20 DIAGNOSIS — R3129 Other microscopic hematuria: Secondary | ICD-10-CM | POA: Diagnosis not present

## 2021-11-20 DIAGNOSIS — N2581 Secondary hyperparathyroidism of renal origin: Secondary | ICD-10-CM | POA: Diagnosis not present

## 2021-11-27 DIAGNOSIS — M9903 Segmental and somatic dysfunction of lumbar region: Secondary | ICD-10-CM | POA: Diagnosis not present

## 2021-11-27 DIAGNOSIS — M9902 Segmental and somatic dysfunction of thoracic region: Secondary | ICD-10-CM | POA: Diagnosis not present

## 2021-11-28 DIAGNOSIS — M9903 Segmental and somatic dysfunction of lumbar region: Secondary | ICD-10-CM | POA: Diagnosis not present

## 2021-11-28 DIAGNOSIS — M9902 Segmental and somatic dysfunction of thoracic region: Secondary | ICD-10-CM | POA: Diagnosis not present

## 2021-12-02 DIAGNOSIS — M9902 Segmental and somatic dysfunction of thoracic region: Secondary | ICD-10-CM | POA: Diagnosis not present

## 2021-12-02 DIAGNOSIS — M9903 Segmental and somatic dysfunction of lumbar region: Secondary | ICD-10-CM | POA: Diagnosis not present

## 2021-12-04 DIAGNOSIS — M9903 Segmental and somatic dysfunction of lumbar region: Secondary | ICD-10-CM | POA: Diagnosis not present

## 2021-12-04 DIAGNOSIS — M9902 Segmental and somatic dysfunction of thoracic region: Secondary | ICD-10-CM | POA: Diagnosis not present

## 2021-12-09 ENCOUNTER — Emergency Department (HOSPITAL_COMMUNITY): Payer: Medicare Other

## 2021-12-09 ENCOUNTER — Other Ambulatory Visit: Payer: Self-pay

## 2021-12-09 ENCOUNTER — Encounter (HOSPITAL_COMMUNITY): Payer: Self-pay

## 2021-12-09 ENCOUNTER — Emergency Department (HOSPITAL_COMMUNITY)
Admission: EM | Admit: 2021-12-09 | Discharge: 2021-12-10 | Disposition: A | Discharge status: Home / Self Care | Payer: Medicare Other | Attending: Emergency Medicine | Admitting: Emergency Medicine

## 2021-12-09 ENCOUNTER — Ambulatory Visit: Payer: Medicare Other | Admitting: Family

## 2021-12-09 DIAGNOSIS — J45909 Unspecified asthma, uncomplicated: Secondary | ICD-10-CM | POA: Diagnosis not present

## 2021-12-09 DIAGNOSIS — I160 Hypertensive urgency: Secondary | ICD-10-CM | POA: Diagnosis not present

## 2021-12-09 DIAGNOSIS — I110 Hypertensive heart disease with heart failure: Secondary | ICD-10-CM | POA: Diagnosis not present

## 2021-12-09 DIAGNOSIS — Z79899 Other long term (current) drug therapy: Secondary | ICD-10-CM | POA: Insufficient documentation

## 2021-12-09 DIAGNOSIS — Z794 Long term (current) use of insulin: Secondary | ICD-10-CM | POA: Diagnosis not present

## 2021-12-09 DIAGNOSIS — E119 Type 2 diabetes mellitus without complications: Secondary | ICD-10-CM | POA: Insufficient documentation

## 2021-12-09 DIAGNOSIS — Z743 Need for continuous supervision: Secondary | ICD-10-CM | POA: Diagnosis not present

## 2021-12-09 DIAGNOSIS — Z7951 Long term (current) use of inhaled steroids: Secondary | ICD-10-CM | POA: Insufficient documentation

## 2021-12-09 DIAGNOSIS — R0789 Other chest pain: Secondary | ICD-10-CM | POA: Diagnosis not present

## 2021-12-09 DIAGNOSIS — I509 Heart failure, unspecified: Secondary | ICD-10-CM | POA: Diagnosis not present

## 2021-12-09 DIAGNOSIS — R6889 Other general symptoms and signs: Secondary | ICD-10-CM | POA: Diagnosis not present

## 2021-12-09 DIAGNOSIS — R079 Chest pain, unspecified: Secondary | ICD-10-CM | POA: Diagnosis not present

## 2021-12-09 LAB — BASIC METABOLIC PANEL
Anion gap: 8 (ref 5–15)
BUN: 12 mg/dL (ref 8–23)
CO2: 24 mmol/L (ref 22–32)
Calcium: 9.8 mg/dL (ref 8.9–10.3)
Chloride: 106 mmol/L (ref 98–111)
Creatinine, Ser: 1.14 mg/dL — ABNORMAL HIGH (ref 0.44–1.00)
GFR, Estimated: 53 mL/min — ABNORMAL LOW (ref >60)
Glucose, Bld: 143 mg/dL — ABNORMAL HIGH (ref 70–99)
Potassium: 3.8 mmol/L (ref 3.5–5.1)
Sodium: 138 mmol/L (ref 135–145)

## 2021-12-09 LAB — CBC
HCT: 39.7 % (ref 36.0–46.0)
Hemoglobin: 12.5 g/dL (ref 12.0–15.0)
MCH: 26.3 pg (ref 26.0–34.0)
MCHC: 31.5 g/dL (ref 30.0–36.0)
MCV: 83.4 fL (ref 80.0–100.0)
Platelets: 267 10*3/uL (ref 150–400)
RBC: 4.76 MIL/uL (ref 3.87–5.11)
RDW: 15.9 % — ABNORMAL HIGH (ref 11.5–15.5)
WBC: 5.5 10*3/uL (ref 4.0–10.5)
nRBC: 0 % (ref 0.0–0.2)

## 2021-12-09 LAB — TROPONIN I (HIGH SENSITIVITY): Troponin I (High Sensitivity): <2 ng/L (ref <18)

## 2021-12-09 LAB — BRAIN NATRIURETIC PEPTIDE: B Natriuretic Peptide: 11.2 pg/mL (ref 0.0–100.0)

## 2021-12-09 NOTE — ED Provider Triage Note (Signed)
Emergency Medicine Provider Triage Evaluation Note  Katrina Vega , a 66 y.o. female  was evaluated in triage.  Pt complains of shortness of breath and chest pain.  Reports that she was at work and started to feel short of breath when she was sitting down.  She got up to try and walk and after 10 steps she was very winded.  Then she started to feel some pain under her left breast.  Did not radiate.  Took nitro which mildly helped her but then happened again this afternoon.  Was given another nitro by EMS and she says that she is not having any pain at this time.  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 Review of Systems  Positive: CP, SOB Negative: Palpit, dizzy syncoope  Physical Exam  BP (!) 161/78 (BP Location: Right Arm)   Pulse 85   Temp 98.4 F (36.9 C) (Oral)   Resp 18   Ht '5\' 5"'$  (1.651 m)   Wt 113.4 kg   SpO2 97%   BMI 41.60 kg/m  Gen:   Awake, no distress   Resp:  Normal effort  MSK:   Moves extremities without difficulty  Other:  RRR, lung sounds clear.  No reproducible chest pain.  Resting comfortably with a normal work of breathing.  Medical Decision Making  Medically screening exam initiated at 4:18 PM.  Appropriate orders placed.  DHARA SCHEPP was informed that the remainder of the evaluation will be completed by another provider, this initial triage assessment does not replace that evaluation, and the importance of remaining in the ED until their evaluation is complete.  \   Rhae Hammock, PA-C 12/09/21 1619

## 2021-12-09 NOTE — ED Triage Notes (Signed)
CP that started 1 hr ago.  Complains of jaw pain and substernal chest pain EMS gave '324mg'$  ASA and 3 nitro with relief.  Denies pain right now.  20g LAC  BP 260/110 with ems 150/80  HR80 RR16 CBG 175 96%

## 2021-12-10 LAB — CBG MONITORING, ED
Glucose-Capillary: 108 mg/dL — ABNORMAL HIGH (ref 70–99)
Glucose-Capillary: 103 mg/dL — ABNORMAL HIGH (ref 70–99)

## 2021-12-10 LAB — TROPONIN I (HIGH SENSITIVITY): Troponin I (High Sensitivity): 3 ng/L (ref <18)

## 2021-12-10 MED ORDER — FUROSEMIDE 10 MG/ML IJ SOLN
20.0000 mg | Freq: Once | INTRAMUSCULAR | Status: AC
  Administered 2021-12-10: 20 mg via INTRAVENOUS
  Filled 2021-12-10: qty 2

## 2021-12-10 NOTE — ED Notes (Signed)
Checked patient cbg it was 56 notified RN of blood sugar

## 2021-12-10 NOTE — Discharge Instructions (Addendum)
Discussed, please resume taking your torsemide daily.  Be sure to keep your appointment in 48 hours with your cardiologist.  Return here for concerning changes in your condition.

## 2021-12-10 NOTE — ED Provider Notes (Signed)
[facility] EMERGENCY DEPARTMENT Provider Note   CSN: [id number] Arrival date & time: 12/09/21  1554     History  Chief Complaint  Patient presents with   Chest Pain    Katrina Vega is a 66 y.o. female.  HPI Patient with multiple medical issues including obesity, hypertension, asthma, heart failure, diabetes presents with dyspnea.  Symptoms occurred few days ago, improved, but yesterday she had recurrence of substantial dyspnea with fatigue.  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  She received aspirin 324, nitroglycerin, notes that she feels somewhat better.  Notably the patient was previously on daily Lasix, is currently taking Jardiance per her cardiology team.    Home Medications Prior to Admission medications   Medication Sig Start Date End Date Taking? Authorizing Provider  albuterol (PROVENTIL) (2.5 MG/3ML) 0.083% nebulizer solution Take 3 mLs (2.5 mg total) by nebulization every 6 (six) hours as needed for wheezing or shortness of breath. 03/15/21   Dara Hoyer, FNP  albuterol (VENTOLIN HFA) 108 (90 Base) MCG/ACT inhaler Inhale 2 puffs into the lungs every 6 (six) hours as needed for wheezing. 04/03/21   Dara Hoyer, FNP  azelastine (ASTELIN) 0.1 % nasal spray 1-2 sprays per nostril 2 times daily as needed for runny nose. 05/09/20   Garnet Sierras, DO  BD AUTOSHIELD DUO 30G X 5 MM MISC 1 KIT BY MISC ROUTE 3 TIMES DAILY BEFORE MEALS 10/08/20   [provider]  benzonatate (TESSALON) 200 MG capsule Take 1 capsule (200 mg total) by mouth 2 (two) times daily as needed for cough. 11/05/21   Libby Maw, MD  Budeson-Glycopyrrol-Formoterol (BREZTRI AEROSPHERE) 160-9-4.8 MCG/ACT AERO Inhale two puffs twice daily to prevent cough or wheeze.  Rinse, gargle, and spit after use. 06/17/21   Ambs, Kathrine Cords, FNP  celecoxib (CELEBREX) 200 MG capsule Take by mouth. 01/30/21   [provider]  cetirizine (ZYRTEC) 10 MG tablet Take 1 tablet by mouth daily.    [provider]  Cholecalciferol 50 MCG (2000 UT) TABS Take by mouth.    [provider]  clopidogrel (PLAVIX) 75 MG tablet Take 1 tablet (75 mg total) by mouth daily. 10/17/21   Adrian Prows, MD  Continuous Blood Gluc Sensor (DEXCOM G6 SENSOR) MISC USE 1 SENSOR AS NEEDED CHANGE EVERY 10 DAYS 01/25/20   [provider]  cromolyn (OPTICROM) 4 % ophthalmic solution Use 1 drop in each eye up to 4 times a day as needed for red, itchy, or watery eyes 07/11/21   Althea Charon, FNP  diclofenac Sodium (VOLTAREN) 1 % GEL Apply 4 grams topically 4 (four) times daily. 07/25/21   Deno Etienne, DO  empagliflozin (JARDIANCE) 10 MG TABS tablet Take 10 mg by mouth daily. 07/18/20   [provider]  EPINEPHrine (EPIPEN 2-PAK) 0.3 mg/0.3 mL IJ SOAJ injection Use as directed for severe allergic reactions 02/29/20   Bobbitt, Sedalia Muta, MD  ferrous fumarate (HEMOCYTE - 106 MG FE) 325 (106 Fe) MG TABS tablet Take by mouth.    [provider]  gabapentin (NEURONTIN) 300 MG capsule TAKE 1 CAPSULE BY MOUTH 3 TIMES  DAILY 07/02/21   Marrian Salvage, FNP  glucagon 1 MG injection Inject 1 mg into the muscle once as needed (low blood sugar). 07/30/19   [provider]  glucose 4 GM chewable tablet Chew 1 tablet by mouth once as needed  for low blood sugar. 07/30/19   [provider]  HUMALOG 100 UNIT/ML injection Inject into the skin. 06/06/21   [provider]  HUMULIN N 100 UNIT/ML injection Inject 60 Units into the skin 3 (three) times daily before meals. 06/04/20   [provider]  isosorbide mononitrate (IMDUR) 60 MG 24 hr tablet Take 1 tablet (60 mg total) by mouth daily. 10/17/21   Adrian Prows, MD  lisinopril (ZESTRIL) 20 MG tablet Take 1 tablet (20 mg total) by mouth daily. 10/17/21   Adrian Prows, MD  metoprolol tartrate (LOPRESSOR) 50 MG tablet Take 1 tablet (50 mg total)  by mouth every 12 (twelve) hours. 10/17/21   Adrian Prows, MD  montelukast (SINGULAIR) 10 MG tablet TAKE 1 TABLET(10 MG) BY MOUTH AT BEDTIME 06/14/21   Ambs, Kathrine Cords, FNP  nitroGLYCERIN (NITROSTAT) 0.4 MG SL tablet Place 1 tablet (0.4 mg total) under the tongue every 5 (five) minutes as needed for chest pain. 04/19/21   Adrian Prows, MD  OZEMPIC, 2 MG/DOSE, 8 MG/3ML SOPN Inject into the skin. 06/05/21   [provider]  pantoprazole (PROTONIX) 40 MG tablet TAKE 1 TABLET BY MOUTH IN THE  MORNING AND AT BEDTIME 10/07/21   Marrian Salvage, FNP  psyllium (METAMUCIL) 58.6 % packet Take 1 packet by mouth daily as needed (regularity).    [provider]  Respiratory Therapy Supplies (CARETOUCH 2 CPAP HOSE HANGER) MISC by Does not apply route.    [provider]  rosuvastatin (CRESTOR) 40 MG tablet Take 1 tablet (40 mg total) by mouth at bedtime. 10/17/21   Adrian Prows, MD  tiZANidine (ZANAFLEX) 4 MG tablet Take by mouth.    [provider]  torsemide (DEMADEX) 20 MG tablet TAKE 1 TABLET (20 MG TOTAL) BY  MOUTH DAILY AS NEEDED (FLUID  BUILD UP). 03/26/21   Adrian Prows, MD      Allergies    Contrast media [iodinated contrast media], Morphine, Statins, and Ioversol    Review of Systems   Review of Systems  All other systems reviewed and are negative.   Physical Exam Updated Vital Signs BP (!) 154/90   Pulse 69   Temp 98.3 F (36.8 C) (Oral)   Resp 12   Ht 5' 5"  (1.651 m)   Wt 113.4 kg   SpO2 96%   BMI 41.60 kg/m  Physical Exam Vitals and nursing note reviewed.  Constitutional:      General: She is not in acute distress.    Appearance: She is well-developed. She is obese.  HENT:     Head: Normocephalic and atraumatic.  Eyes:     Conjunctiva/sclera: Conjunctivae normal.  Cardiovascular:     Rate and Rhythm: Normal rate and regular rhythm.  Pulmonary:     Effort: Pulmonary effort is normal. No respiratory distress.     Breath sounds: No stridor. Decreased  breath sounds present. No wheezing.  Abdominal:     General: There is no distension.  Skin:    General: Skin is warm and dry.  Neurological:     Mental Status: She is alert and oriented to person, place, and time.     Cranial Nerves: No cranial nerve deficit.  Psychiatric:        Mood and Affect: Mood normal.     ED Results / Procedures / Treatments   Labs (all labs ordered are listed, but only abnormal results are displayed) Labs Reviewed  BASIC METABOLIC PANEL - Abnormal; Notable for the following components:  Result Value   Glucose, Bld 143 (*)    Creatinine, Ser 1.14 (*)    GFR, Estimated 53 (*)    All other components within normal limits  CBC - Abnormal; Notable for the following components:   RDW 15.9 (*)    All other components within normal limits  CBG MONITORING, ED - Abnormal; Notable for the following components:   Glucose-Capillary 108 (*)    All other components within normal limits  CBG MONITORING, ED - Abnormal; Notable for the following components:   Glucose-Capillary 103 (*)    All other components within normal limits  BRAIN NATRIURETIC PEPTIDE  TROPONIN I (HIGH SENSITIVITY)  TROPONIN I (HIGH SENSITIVITY)    EKG EKG Interpretation  Date/Time:  Monday December 09 2021 16:11:44 EDT Ventricular Rate:  77 PR Interval:  172 QRS Duration: 78 QT Interval:  362 QTC Calculation: 409 R Axis:   23 Text Interpretation: Normal sinus rhythm Low voltage QRS Cannot rule out Anterior infarct , age undetermined Abnormal ECG When compared with ECG of 28-Sep-1998 12:34, PREVIOUS ECG IS PRESENT Confirmed by Elnora Morrison [phone]) on 12/09/2021 4:20:33 PM  Radiology DG Chest 2 View  Result Date: 12/09/2021 CLINICAL DATA:  Chest pain. EXAM: CHEST - 2 VIEW COMPARISON:  None Available. FINDINGS: Mild cardiomegaly is noted. Mild interstitial densities are noted in the perihilar and basilar regions concerning for pulmonary edema. Bony thorax is unremarkable.  IMPRESSION: Mild bilateral interstitial densities are noted concerning for pulmonary edema. Electronically Signed   By: Marijo Conception M.D.   On: 12/09/2021 16:45    Procedures Procedures    Medications Ordered in ED Medications  furosemide (LASIX) injection 20 mg (20 mg Intravenous Given 12/10/21 1124)    ED Course/ Medical Decision Making/ A&P This patient with a Hx of obesity, diabetes, hypertension, asthma presents to the ED for concern of dyspnea, hypertension, this involves an extensive number of treatment options, and is a complaint that carries with it a high risk of complications and morbidity.    The differential diagnosis includes hypertensive crisis, heart failure exacerbation, pneumonia, bacteremia, sepsis, ACS   Social Determinants of Health:  Obesity, multiple medical issues  Additional history obtained:  Additional history and/or information obtained from chart review, notable for ongoing efforts for control of her respiratory disease, CKD   After the initial evaluation, orders, including: Labs x-ray from triage were initiated.   Patient placed on Cardiac and Pulse-Oximetry Monitors. The patient was maintained on a cardiac monitor.  The cardiac monitored showed an rhythm of 70 sinus normal The patient was also maintained on pulse oximetry. The readings were typically 97% room air normal   On repeat evaluation of the patient improved  Lab Tests:  I personally interpreted labs.  The pertinent results include: Mild hyperglycemia, creatinine 1.14, up from 0.99 last year  Imaging Studies ordered:  I independently visualized and interpreted imaging which showed pulmonary edema with interstitial densities on x-ray I agree with the radiologist interpretation  Consultations Obtained:  I requested consultation with the patient's cardiology team.  They will facilitate close outpatient follow-up  Dispostion / Final MDM:  After consideration of the diagnostic  results and the patient's response to treatment, patient has been started on a diuretic, increased blood pressure control, patient is awake, alert, laughing.  Blood pressure now markedly improved compared to reported 166 systolic.  Hospitalization was a consideration, but she has no oxygen requirement, is comfortable, has had IV Lasix, we discussed all findings again at length,  no evidence for pneumonia, ACS, bacteremia, sepsis, patient will start diuresis, orally as well, follow-up with cardiologist in the coming days.  Final Clinical Impression(s) / ED Diagnoses Final diagnoses:  Atypical chest pain  Hypertensive urgency     Carmin Muskrat, MD 12/10/21 1153

## 2021-12-10 NOTE — ED Notes (Signed)
Pt states has history of stents, chf, insulin dependent diabetic. Rechecking blood sugar.

## 2021-12-12 ENCOUNTER — Ambulatory Visit: Payer: Medicare Other

## 2021-12-12 VITALS — BP 105/62 | HR 83 | Temp 98.0°F | Resp 16 | Ht 65.0 in | Wt 250.0 lb

## 2021-12-12 DIAGNOSIS — I10 Essential (primary) hypertension: Secondary | ICD-10-CM

## 2021-12-12 DIAGNOSIS — I25118 Atherosclerotic heart disease of native coronary artery with other forms of angina pectoris: Secondary | ICD-10-CM

## 2021-12-12 DIAGNOSIS — R0609 Other forms of dyspnea: Secondary | ICD-10-CM | POA: Diagnosis not present

## 2021-12-12 DIAGNOSIS — I5032 Chronic diastolic (congestive) heart failure: Secondary | ICD-10-CM

## 2021-12-12 MED ORDER — POTASSIUM CHLORIDE ER 10 MEQ PO TBCR: 10.0000 meq | EXTENDED_RELEASE_TABLET | Freq: Every day | ORAL | 3 refills | Status: DC

## 2021-12-12 MED ORDER — FUROSEMIDE 40 MG PO TABS: 40.0000 mg | ORAL_TABLET | Freq: Every day | ORAL | 3 refills | Status: DC

## 2021-12-12 NOTE — Progress Notes (Addendum)
Primary Physician/Referring:  [facility]  Patient ID: Katrina Vega, female    DOB: [birth year], 66 y.o.   MRN: [id number]  Chief Complaint  Patient presents with   Coronary Artery Disease   Follow-up    HPI:    Katrina Vega  is a 66 y.o. Female patient with coronary artery disease and history of PCI to the LAD and  ramus in 2008 with implantation of Taxus stents, NSTEMI IN 2013 SP BALLOON ANGIOPLASTY TO SMALL RCA , chronic diastolic heart failure, hyperlipidemia, hypertension, diabetes mellitus with stage III chronic kidney disease, morbid obesity, OSA unable to tolerate CPAP,  peripheral arterial disease with right posterior tibial DES stent placed in 2008 and bilateral SFA stenting sometime in 2016 and atherectomy for ISR in 2019.  She was seen in the ED on 12/09/21 with complaints of dyspnea and elevated blood pressure with systolic blood pressure of 220. In ED she was treated with Lasix 63m IV and symptoms improved. Workup was not concerning for acute coronary syndrome and she was discharged home on oral diuretic. She presents today for follow-up. She is still having shortness of breath with exertion and at rest. She feels like she has excess fluid. She does endorse chest pain in middle of chest that is intermittent and worse with deep breath.  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 Negative for claudication, orthopnea, palpitations and paroxysmal nocturnal dyspnea.  Gastrointestinal:  Negative for melena.  Neurological:  Negative for dizziness and light-headedness.   Objective  Blood pressure 105/62, pulse 83, temperature 98 F (36.7 C), temperature source Temporal, resp. rate 16, height 5' 5"  (1.651 m), weight 250 lb (113.4 kg), SpO2 97 %.     12/12/2021    1:35 PM 12/10/2021   12:00 PM 12/10/2021   10:30 AM  Vitals with BMI  Height 5' 5"     Weight 250 lbs    BMI 420.9   Systolic [id number] Diastolic 62 70 90  Pulse 83 78 69     Physical Exam Constitutional:      Appearance: She is morbidly obese.     Comments: Morbidly obese in no acute distress.  Neck:     Vascular: No carotid bruit or JVD.  Cardiovascular:     Rate and Rhythm: Normal rate and regular rhythm.     Pulses:          Carotid pulses are 2+ on the right side and 2+ on the left side.      Radial pulses are 2+ on the right side and 2+ on the left side.       Femoral pulses are 1+ on the right side and 1+  on the left side.      Dorsalis pedis pulses are 0 on the right side and 0 on the left side.       Posterior tibial pulses are 0 on the right side and 0 on the left side.     Heart sounds: Normal heart sounds. No murmur heard.    No gallop.  Pulmonary:     Effort: Pulmonary effort is normal.     Breath sounds: Examination of the right-middle field reveals rales. Examination of the left-middle field reveals rales. Examination of the right-lower field reveals decreased breath sounds. Examination of the left-lower field reveals decreased breath sounds. Decreased breath sounds and rales present. No wheezing.  Abdominal:     General: Bowel sounds are normal.     Palpations: Abdomen is soft.  Musculoskeletal:     Right lower leg: Edema present.     Left lower leg: Edema present.    Laboratory examination:   Lipid Panel      Component Value Date/Time   CHOL 110 01/31/2021 1118   TRIG 66.0 01/31/2021 1118   HDL 36.70 (L) 01/31/2021 1118   CHOLHDL 3 01/31/2021 1118   VLDL 13.2 01/31/2021 1118   LDLCALC 60 01/31/2021 1118   HEMOGLOBIN A1C Lab Results  Component Value Date   HGBA1C 7.2 04/25/2021       Latest Ref Rng & Units 12/09/2021    4:13 PM 02/12/2021    8:49 AM  BMP  Glucose 70 - 99 mg/dL 143  86   BUN 8 - 23 mg/dL 12  15   Creatinine 0.44 - 1.00 mg/dL 1.14  0.99   Sodium 135 - 145 mmol/L 138  140   Potassium 3.5 - 5.1 mmol/L 3.8  3.9   Chloride 98 - 111 mmol/L 106  106   CO2 22 - 32 mmol/L 24  24   Calcium 8.9 - 10.3 mg/dL 9.8  9.4    CBC    Component Value Date/Time   WBC 5.5 12/09/2021 1613   RBC 4.76 12/09/2021 1613   HGB 12.5 12/09/2021 1613   HGB 13.2 09/24/2021 1037   HCT 39.7 12/09/2021 1613   PLT 267 12/09/2021 1613   PLT 277 09/24/2021 1037   MCV 83.4 12/09/2021 1613   MCH 26.3 12/09/2021 1613   MCHC 31.5 12/09/2021 1613   RDW 15.9 (H) 12/09/2021 1613   LYMPHSABS 2.1 09/24/2021 1037   MONOABS 0.4 09/24/2021 1037   EOSABS 0.1 09/24/2021 1037   BASOSABS 0.0 09/24/2021 1037   External labs:   Medications and allergies   Allergies  Allergen Reactions   Contrast Media [Iodinated Contrast Media] Shortness Of Breath and Other (See Comments)    sob, chest tight   Morphine Itching   Statins Diarrhea and Other (See Comments)    nausea, diarrhea, myalgias   Ioversol      Current Outpatient Medications:    albuterol (PROVENTIL) (2.5 MG/3ML) 0.083% nebulizer solution, Take 3 mLs (2.5 mg total) by nebulization every 6 (six) hours as needed for wheezing or shortness of breath., Disp: 75 mL, Rfl: 2   albuterol (VENTOLIN HFA) 108 (90 Base) MCG/ACT inhaler, Inhale 2 puffs into the lungs every 6 (six) hours as needed for wheezing., Disp: 18 g, Rfl: 1   azelastine (ASTELIN) 0.1 % nasal spray, 1-2 sprays per nostril 2 times daily as needed for runny nose., Disp: 90 mL, Rfl:  2   BD AUTOSHIELD DUO 30G X 5 MM MISC, 1 KIT BY MISC  ROUTE 3 TIMES DAILY BEFORE MEALS, Disp: , Rfl:    benzonatate (TESSALON) 200 MG capsule, Take 1 capsule (200 mg total) by mouth 2 (two) times daily as needed for cough., Disp: 20 capsule, Rfl: 0   Budeson-Glycopyrrol-Formoterol (BREZTRI AEROSPHERE) 160-9-4.8 MCG/ACT AERO, Inhale two puffs twice daily to prevent cough or wheeze.  Rinse, gargle, and spit after use., Disp: 32.1 g, Rfl: 1   celecoxib (CELEBREX) 200 MG capsule, Take by mouth., Disp: , Rfl:    cetirizine (ZYRTEC) 10 MG tablet, Take 1 tablet by mouth daily., Disp: , Rfl:    Cholecalciferol 50 MCG (2000 UT) TABS, Take by mouth., Disp: , Rfl:    clopidogrel (PLAVIX) 75 MG tablet, Take 1 tablet (75 mg total) by mouth daily., Disp: 90 tablet, Rfl: 3   Continuous Blood Gluc Sensor (DEXCOM G6 SENSOR) MISC, USE 1 SENSOR AS NEEDED CHANGE EVERY 10 DAYS, Disp: , Rfl:    cromolyn (OPTICROM) 4 % ophthalmic solution, Use 1 drop in each eye up to 4 times a day as needed for red, itchy, or watery eyes, Disp: 30 mL, Rfl: 1   diclofenac Sodium (VOLTAREN) 1 % GEL, Apply 4 grams topically 4 (four) times daily., Disp: 100 g, Rfl: 0   EPINEPHrine (EPIPEN 2-PAK) 0.3 mg/0.3 mL IJ SOAJ injection, Use as directed for severe allergic reactions, Disp: 2 each, Rfl: 3   ferrous fumarate (HEMOCYTE - 106 MG FE) 325 (106 Fe) MG TABS tablet, Take by mouth., Disp: , Rfl:    furosemide (LASIX) 40 MG tablet, Take 1 tablet (40 mg total) by mouth daily., Disp: 90 tablet, Rfl: 3   gabapentin (NEURONTIN) 300 MG capsule, TAKE 1 CAPSULE BY MOUTH 3 TIMES  DAILY, Disp: 270 capsule, Rfl: 3   glucagon 1 MG injection, Inject 1 mg into the muscle once as needed (low blood sugar)., Disp: , Rfl:    glucose 4 GM chewable tablet, Chew 1 tablet by mouth once as needed for low blood sugar., Disp: , Rfl:    HUMALOG 100 UNIT/ML injection, Inject into the skin., Disp: , Rfl:    HUMULIN N 100 UNIT/ML injection, Inject 60 Units into the skin 3  (three) times daily before meals., Disp: , Rfl:    isosorbide mononitrate (IMDUR) 60 MG 24 hr tablet, Take 1 tablet (60 mg total) by mouth daily., Disp: 90 tablet, Rfl: 3   lisinopril (ZESTRIL) 20 MG tablet, Take 1 tablet (20 mg total) by mouth daily., Disp: 90 tablet, Rfl: 3   metoprolol tartrate (LOPRESSOR) 50 MG tablet, Take 1 tablet (50 mg total) by mouth every 12 (twelve) hours., Disp: 180 tablet, Rfl: 3   montelukast (SINGULAIR) 10 MG tablet, TAKE 1 TABLET(10 MG) BY MOUTH AT BEDTIME, Disp: 90 tablet, Rfl: 1   nitroGLYCERIN (NITROSTAT) 0.4 MG SL tablet, Place 1 tablet (0.4 mg total) under the tongue every 5 (five) minutes as needed for chest pain., Disp: 25 tablet, Rfl: 3   OZEMPIC, 2 MG/DOSE, 8 MG/3ML SOPN, Inject into the skin., Disp: , Rfl:    pantoprazole (PROTONIX) 40 MG tablet, TAKE 1 TABLET BY MOUTH IN THE  MORNING AND AT BEDTIME, Disp: 200 tablet, Rfl: 2   potassium chloride (KLOR-CON) 10 MEQ tablet, Take 1 tablet (10 mEq total) by mouth daily., Disp: 90 tablet, Rfl: 3   psyllium (METAMUCIL) 58.6 % packet, Take 1 packet by mouth daily as needed (regularity)., Disp: , Rfl:    Respiratory Therapy Supplies (CARETOUCH 2 CPAP HOSE HANGER) MISC, by Does not apply  route., Disp: , Rfl:    rosuvastatin (CRESTOR) 40 MG tablet, Take 1 tablet (40 mg total) by mouth at bedtime., Disp: 90 tablet, Rfl: 3   tiZANidine (ZANAFLEX) 4 MG tablet, Take by mouth., Disp: , Rfl:     Radiology:   Chest xray 12/09/21: FINDINGS: Mild cardiomegaly is noted. Mild interstitial densities are noted in the perihilar and basilar regions concerning for pulmonary edema. Bony thorax is unremarkable.   IMPRESSION: Mild bilateral interstitial densities are noted concerning for pulmonary edema.  Cardiac Studies:   Coronary angioplasty: -RCA PCI (Dr. Tonye Becket) 10/2011 for NSTEMI; LAD/Ramus DES 2008 Taxus;  Coronary angiogram 12/22/2017: A- Left Main: Mild plaquing, no critical lesions seen.  B- LAD: The LAD  contains mild plaquing proximally and gives rise to a large high lying 1st diagonal branch in which a 40% ostial stenosis is present. Mid LAD contains a well deployed stent which is patent (2008 Taxus), no evidence for significant restenoses. The distal LAD is very large and free of significant disease.  C- Left Circumflex: Limited views of the left circumflex were obtained due to dye issues. There appears to be a proximal left circumflex and 1st obtuse marginal stented segments which are patent (Taxus 2008) and show no it evidence for significant restenoses.  D- RCA: The RCA is small and contains a patent proximal stent with 50 to 60% mid stenosis and mild plaquing noted distally.  PTCA site from 2013 is patent.  Peripheral arteriogram 12/22/2017:  There are bilateral SFA stents present with evidence for severe right and mild left in stent restenoses present. The bilateral popliteal arteries and proximal trifurcation vessels are patent. However, there may be moderate to severe lesion involving the mid right anterior tibial artery in particular. In addition, there was poor flow noted in the left anterior tibial artery with 2 vessel runoff probably present bilaterally. Laser atherectomy of right SFA followed by DCB with 5.0 x 120 mm impact.  Echocardiogram 07/04/2019: Normal LV size, mild LVH, hyperdynamic LVEF at >65% with grade 1 diastolic dysfunction.  Otherwise no other significant abnormality.  Poor quality due to patient body habitus.  Lexiscan nuclear stress test 07/07/2019: Nondiagnostic EKG.  Dyspnea during stress test. No evidence of ischemia.  Soft tissue attenuation noted.  LVEF 72%.  Normal wall motion.  Low risk study.  No significant change from 12/20/2017.  Lower extremity venous duplex 07/02/2019: No evidence of DVT in bilateral lower extremity.  Lower Extremity Arterial Duplex 10/09/2020: Right mid and distal SFA occluded, dampened flow in the popliteal artery via collaterals.  Moderate velocity increase at the right profunda femoral artery.  No hemodynamically significant stenoses are identified in the left lower extremity arterial system. This exam reveals normal perfusion of the right lower extremity (ABI 0.97) with monophasic waveform in the PT This exam reveals mildly decreased perfusion of the left lower extremity, noted at the post tibial artery level (ABI 0.90) with monophasic waveform in the left AT.  Compared to 12/16/2019, right SFA stenosis is new.  No significant change in ABI.  Abdominal Aortic Duplex 10/09/2020: The maximum aorta (sac) diameter is 2.51 cm (prox) with mild ectasia in the proximal aorta. Diffuse plaque observed in the proximal, mid and distal aorta.  Bilateral CIA are not well visualized but appear to be severely stenosed with diffuse plaque, however, the flow velocity appears normal.  Clinical correlation recommended. Consider CTA or catheter directed angiography if clinically indicated.   EKG:   EKG 10/17/2021.  Normal sinus rhythm at rate  of 72 bpm, normal axis, no evidence of ischemia, normal EKG. No significant change from 04/19/2021.   Assessment     ICD-10-CM   1. Dyspnea on exertion  R06.09 furosemide (LASIX) 40 MG tablet    potassium chloride (KLOR-CON) 10 MEQ tablet    PCV ECHOCARDIOGRAM COMPLETE    2. Chronic diastolic congestive heart failure (HCC)  I50.32 furosemide (LASIX) 40 MG tablet    PCV ECHOCARDIOGRAM COMPLETE    CMP14+EGFR    3. Coronary artery disease of native artery of native heart with stable angina pectoris ([place])  I25.118     4. Primary hypertension  I10 CMP14+EGFR      Meds ordered this encounter  Medications   furosemide (LASIX) 40 MG tablet    Sig: Take 1 tablet (40 mg total) by mouth daily.    Dispense:  90 tablet    Refill:  3    Order Specific Question:   Supervising Provider    Answer:   Adrian Prows [[id number]]   potassium chloride (KLOR-CON) 10 MEQ tablet    Sig: Take 1 tablet (10 mEq total) by  mouth daily.    Dispense:  90 tablet    Refill:  3    Order Specific Question:   Supervising Provider    Answer:   Adrian Prows [[id number]]    Orders Placed This Encounter  Procedures   CMP14+EGFR   PCV ECHOCARDIOGRAM COMPLETE    Standing Status:   Future    Standing Expiration Date:   12/13/2022   Recommendations:   Katrina Vega is a 66 y.o. Female patient with coronary artery disease and history of PCI to the LAD and  ramus in 2008 with implantation of Taxus stents, NSTEMI IN 2013 SP BALLOON ANGIOPLASTY TO SMALL RCA , chronic diastolic heart failure, hyperlipidemia, hypertension, diabetes mellitus with stage III chronic kidney disease, morbid obesity, OSA unable to tolerate CPAP,  peripheral arterial disease with right posterior tibial DES stent placed in 2008 and bilateral SFA stenting sometime in 2016 and atherectomy for ISR in 2019.  Dyspnea on exertion Chronic diastolic congestive heart failure ([place]) Feel that symptoms of dyspnea are related to pulmonary edema. Will stop torsemide and start furosemide 22m, she will take this twice daily for the next four days and then 48monce daily. I have also added potassium supplement. I have ordered CMP to be completed prior to next appointment. Will schedule for an echocardiogram since she has not had one since 2021.  Coronary artery disease of native artery of native heart with stable angina pectoris (HCC) Chest pain likely pleuritic given location and worse with deep breathing. Advised patient to notify office if chest pain worsens or changes. Continue Plavix for CAD. She is on guideline directed therapy.  Primary hypertension Blood pressure is well controlled. Continue current medications. We discussed the importance of diet, exercise, and weight loss.  Follow-up in 2 weeks or sooner if needed.   BrErnst SpellAGNP-C 12/12/2021, 2:27 PM Office: [phone]

## 2021-12-15 ENCOUNTER — Encounter: Payer: Self-pay | Admitting: Cardiology

## 2021-12-15 NOTE — Patient Instructions (Incomplete)
Asthma Discussed that I  felt that her current symptoms were due to to her diastolic heart failure  and that if her symptoms persist to going to the emergency room Continue montelukast 10 mg once a day to prevent cough or wheeze.  We will send in a refill Continue Breztri 2 puffs twice a day with a spacer to prevent cough or wheeze Continue albuterol 2 puffs every 4 hours as needed for cough or wheeze OR Instead use albuterol 0.083% solution via nebulizer one unit vial every 4 hours as needed for cough or wheeze Continue to avoid cigarette smoke Continue to consider Fasenra injections in the future if needed  Allergic rhinitis Continue allergen avoidance measures directed toward dust mite, cockroach, weed pollen, cat, and dog Continue allergen immunotherapy and have access to an epinephrine autoinjector set.  Please hold off on getting allergy injections until your shortness of breath gets better Continue Nasacort 1 to 2 sprays in each nostril once a day as needed for nasal congestion. Continue  azelastine 2 sprays in each nostril up to twice a day as needed for runny nose/drainage down throat Consider saline nasal rinses as needed for nasal symptoms. Use this before any medicated nasal sprays for best result. Increase saline rinses to twice a day for now Continue an antihistamine once a day as needed for runny nose or itch. Remember to rotate to a different antihistamine about every 3 months. Some examples of over the counter antihistamines include Zyrtec (cetirizine), Xyzal (levocetirizine), Allegra (fexofenadine), and Claritin (loratidine).   Allergic conjunctivitis Continue cromolyn 4% eyedrops.  Use 1 drop in each eye up to 4 times a day as needed for red, itchy, or watery eyes  Reflux Continue dietary and lifestyle modifications as listed below Continue pantoprazole as previously prescribed We will refer you to GI due to your continued symptoms despite taking pantoprazole twice a  day   Follow up in 4 weeks or sooner if needed.   Lifestyle Changes for Controlling GERD When you have GERD, stomach acid feels as if it's backing up toward your mouth. Whether or not you take medication to control your GERD, your symptoms can often be improved with lifestyle changes.   Raise Your Head Reflux is more likely to strike when you're lying down flat, because stomach fluid can flow backward more easily. Raising the head of your bed 4-6 inches can help. To do this: Slide blocks or books under the legs at the head of your bed. Or, place a wedge under the mattress. Many foam stores can make a suitable wedge for you. The wedge should run from your waist to the top of your head. Don't just prop your head on several pillows. This increases pressure on your stomach. It can make GERD worse.  Watch Your Eating Habits Certain foods may increase the acid in your stomach or relax the lower esophageal sphincter, making GERD more likely. It's best to avoid the following: Coffee, tea, and carbonated drinks (with and without caffeine) Fatty, fried, or spicy food Mint, chocolate, onions, and tomatoes Any other foods that seem to irritate your stomach or cause you pain  Relieve the Pressure Eat smaller meals, even if you have to eat more often. Don't lie down right after you eat. Wait a few hours for your stomach to empty. Avoid tight belts and tight-fitting clothes. Lose excess weight.  Tobacco and Alcohol Avoid smoking tobacco and drinking alcohol. They can make GERD symptoms worse.  Reducing Pollen Exposure The American Academy  of Allergy, Asthma and Immunology suggests the following steps to reduce your exposure to pollen during allergy seasons. Do not hang sheets or clothing out to dry; pollen may collect on these items. Do not mow lawns or spend time around freshly cut grass; mowing stirs up pollen. Keep windows closed at night.  Keep car windows closed while  driving. Minimize morning activities outdoors, a time when pollen counts are usually at their highest. Stay indoors as much as possible when pollen counts or humidity is high and on windy days when pollen tends to remain in the air longer. Use air conditioning when possible.  Many air conditioners have filters that trap the pollen spores. Use a HEPA room air filter to remove pollen form the indoor air you breathe.   Control of Dust Mite Allergen Dust mites play a major role in allergic asthma and rhinitis. They occur in environments with high humidity wherever human skin is found. Dust mites absorb humidity from the atmosphere (ie, they do not drink) and feed on organic matter (including shed human and animal skin). Dust mites are a microscopic type of insect that you cannot see with the naked eye. High levels of dust mites have been detected from mattresses, pillows, carpets, upholstered furniture, bed covers, clothes, soft toys and any woven material. The principal allergen of the dust mite is found in its feces. A gram of dust may contain 1,000 mites and 250,000 fecal particles. Mite antigen is easily measured in the air during house cleaning activities. Dust mites do not bite and do not cause harm to humans, other than by triggering allergies/asthma.  Ways to decrease your exposure to dust mites in your home:  1. Encase mattresses, box springs and pillows with a mite-impermeable barrier or cover  2. Wash sheets, blankets and drapes weekly in hot water (130 F) with detergent and dry them in a dryer on the hot setting.  3. Have the room cleaned frequently with a vacuum cleaner and a damp dust-mop. For carpeting or rugs, vacuuming with a vacuum cleaner equipped with a high-efficiency particulate air (HEPA) filter. The dust mite allergic individual should not be in a room which is being cleaned and should wait 1 hour after cleaning before going into the room.  4. Do not sleep on upholstered  furniture (eg, couches).  5. If possible removing carpeting, upholstered furniture and drapery from the home is ideal. Horizontal blinds should be eliminated in the rooms where the person spends the most time (bedroom, study, television room). Washable vinyl, roller-type shades are optimal.  6. Remove all non-washable stuffed toys from the bedroom. Wash stuffed toys weekly like sheets and blankets above.  7. Reduce indoor humidity to less than 50%. Inexpensive humidity monitors can be purchased at most hardware stores. Do not use a humidifier as can make the problem worse and are not recommended.  Control of Dog or Cat Allergen Avoidance is the best way to manage a dog or cat allergy. If you have a dog or cat and are allergic to dog or cats, consider removing the dog or cat from the home. If you have a dog or cat but don't want to find it a new home, or if your family wants a pet even though someone in the household is allergic, here are some strategies that may help keep symptoms at bay:  Keep the pet out of your bedroom and restrict it to only a few rooms. Be advised that keeping the dog or cat in  only one room will not limit the allergens to that room. Don't pet, hug or kiss the dog or cat; if you do, wash your hands with soap and water. High-efficiency particulate air (HEPA) cleaners run continuously in a bedroom or living room can reduce allergen levels over time. Regular use of a high-efficiency vacuum cleaner or a central vacuum can reduce allergen levels. Giving your dog or cat a bath at least once a week can reduce airborne allergen.  Control of Cockroach Allergen Cockroach allergen has been identified as an important cause of acute attacks of asthma, especially in urban settings.  There are fifty-five species of cockroach that exist in the [place], however only three, the [place], [place] species produce allergen that can affect patients with Asthma.  Allergens can be  obtained from fecal particles, egg casings and secretions from cockroaches.    Remove food sources. Reduce access to water. Seal access and entry points. Spray runways with 0.5-1% Diazinon or Chlorpyrifos Blow boric acid power under stoves and refrigerator. Place bait stations (hydramethylnon) at feeding sites.

## 2021-12-16 ENCOUNTER — Ambulatory Visit (INDEPENDENT_AMBULATORY_CARE_PROVIDER_SITE_OTHER): Payer: Medicare Other | Admitting: Family

## 2021-12-16 ENCOUNTER — Other Ambulatory Visit: Payer: Medicare Other

## 2021-12-16 ENCOUNTER — Encounter: Payer: Self-pay | Admitting: Family

## 2021-12-16 ENCOUNTER — Ambulatory Visit: Payer: Medicare Other

## 2021-12-16 VITALS — BP 124/62 | HR 75 | Temp 98.0°F | Resp 17 | Ht 65.0 in | Wt 253.0 lb

## 2021-12-16 VITALS — BP 118/68 | HR 80 | Temp 98.2°F | Resp 18 | Ht 65.0 in | Wt 251.8 lb

## 2021-12-16 DIAGNOSIS — K219 Gastro-esophageal reflux disease without esophagitis: Secondary | ICD-10-CM | POA: Diagnosis not present

## 2021-12-16 DIAGNOSIS — H1013 Acute atopic conjunctivitis, bilateral: Secondary | ICD-10-CM

## 2021-12-16 DIAGNOSIS — I5032 Chronic diastolic (congestive) heart failure: Secondary | ICD-10-CM

## 2021-12-16 DIAGNOSIS — M9903 Segmental and somatic dysfunction of lumbar region: Secondary | ICD-10-CM | POA: Diagnosis not present

## 2021-12-16 DIAGNOSIS — J302 Other seasonal allergic rhinitis: Secondary | ICD-10-CM | POA: Diagnosis not present

## 2021-12-16 DIAGNOSIS — R0609 Other forms of dyspnea: Secondary | ICD-10-CM | POA: Diagnosis not present

## 2021-12-16 DIAGNOSIS — J4489 Other specified chronic obstructive pulmonary disease: Secondary | ICD-10-CM

## 2021-12-16 DIAGNOSIS — H1010 Acute atopic conjunctivitis, unspecified eye: Secondary | ICD-10-CM

## 2021-12-16 DIAGNOSIS — M9902 Segmental and somatic dysfunction of thoracic region: Secondary | ICD-10-CM | POA: Diagnosis not present

## 2021-12-16 HISTORY — PX: TRANSTHORACIC ECHOCARDIOGRAM: SHX275

## 2021-12-16 MED ORDER — MONTELUKAST SODIUM 10 MG PO TABS: ORAL_TABLET | ORAL | 1 refills | Status: DC

## 2021-12-16 MED ORDER — BUMETANIDE 1 MG PO TABS: 1.0000 mg | ORAL_TABLET | Freq: Every day | ORAL | 3 refills | Status: DC

## 2021-12-16 MED ORDER — EMPAGLIFLOZIN 10 MG PO TABS: 10.0000 mg | ORAL_TABLET | Freq: Every day | ORAL | 3 refills | Status: DC

## 2021-12-16 NOTE — Progress Notes (Signed)
[place] [address] Dept: [phone]  FOLLOW UP NOTE  Patient ID: Katrina Vega, female    DOB: [birth year]  Age: 66 y.o. MRN: [id number] Date of Office Visit: 12/16/2021  Assessment  Chief Complaint: Asthma (3 mth f/u - ????), Seasonal and Perennial allergic rhinoconjunctivitis (3 mth f/u - [place]), and Gastroesophageal Reflux (3 mth f/u - [place])  HPI Katrina Vega is a 66 year old female who presents today for follow up of asthma-COPD overlap syndrome, seasonal and perennial allergic rhinitis, allergic conjunctivitis, and gastroesophageal reflux disease. She was last seen on September 09, 2021 by myself.   She reports last Monday she was at work and had trouble breathing.  She took 2 puffs of her albuterol inhaler and her symptoms were not getting any better.  She waited some time and took 1 nitroglycerin.  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  She reports when they checked her blood pressure it was 260/110 and that her pulse was racing.  She instructed them that she has a history of heart failure, diabetes, asthma, and chronic kidney disease.  They gave her 4 aspirin and recommended that she go to the emergency room.  She called an ambulance and while on the ambulance reports that she was given 2 nitroglycerin.  While in the emergency room she mentions that they did lab work and a chest x-ray.  She found on an app that the chest x-ray showed pulmonary edema.  She was given Lasix IV.  She already had a follow-up appointment scheduled with cardiology on Thursday.  While she was at the emergency room she reports that she went to the bathroom 2 times.  On Thursday she went to the cardiologist appointment and told them that she still had fluids and that [place] her and measure her fluids going in and out.  She was prescribed Lasix 40 mg twice a day for 4 days and finished this  yesterday.  She reports that she went to the bathroom only 4 times each day.  Yesterday evening around 7:30 PM she tried calling the on-call cardiologist and never received a call back.  This morning she called the cardiology office and made an appointment for today.  She  is now back on Jardiance, but previously was taken off this.  She was prescribed  Bumex 1 mg once a day today.  She mentions that her side and lower back are hurting and that she has notified her nephrologist.  She also has a follow-up appointment tomorrow with her primary care physician.  She reports swelling still on her lower extremities and she feels bloated in her abdomen.The recommendations from her cardiology appointment from today: "Dyspnea on exertion Chronic diastolic congestive heart failure ([place]) Since she is not having adequate diuresis with Lasix we will stop at this and start Bumex 1 mg daily.  We will also restart Jardiance 10 mg daily. CMP ordered, advised patient to have lab work completed in 1 week. Continue potassium supplements as previously ordered. Advised patient to monitor weight daily and keep log of readings.   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 She is followed by nephrology. Will repeat lab work in 1 week.   Follow-up in 2  weeks or sooner if needed."  Asthma: She continues to take montelukast 10 mg once a day, Breztri 2 puffs twice a day, and albuterol as needed.  She reports a dry cough every day, shortness of breath with exertion, and nocturnal awakenings due to breathing problems sometimes.  She did receive a steroid approximately 3 weeks ago from her primary care physician's office due to thinking she was getting bronchitis.  She was also given Best boy.  Here recently with her symptoms of shortness of breath she has been using her albuterol once a day.   Sometimes albuterol helps sometimes it does not.   Allergic rhinitis: She continues to receive allergy injections per protocol, but has not received them recently due to being sick.  She has Nasacort nasal spray that she uses once a day.  She did not ever increase her azelastine to 2 sprays each nostril twice a day as needed for runny nose/drainage down the throat.  She thinks that she takes Claritin during the summer months and her symptoms are worse.  [name] does some times because her allergies to act up.  She reports clear rhinorrhea she will sometimes have "chunks of yellow".  She also has postnasal drip and denies nasal congestion.  She has not had any sinus infections since we last saw her.  Allergic conjunctivitis: She has itchy watery eyes at times for which cromolyn 4% eyedrops help.  She does mention that cromolyn does cause her eyes to burn for a little bit and then it stops.  Discussed that this was a common side effect with cromolyn eyedrops.  Reflux: She continues to take pantoprazole twice a day.  She reports burning sensation and also a lump of phlegm in her throat.  She has not seen GI in some time.  Drug Allergies:  Allergies  Allergen Reactions   Contrast Media [Iodinated Contrast Media] Shortness Of Breath and Other (See Comments)    sob, chest tight   Morphine Itching   Statins Diarrhea and Other (See Comments)    nausea, diarrhea, myalgias   Ioversol     Review of Systems: Review of Systems  Constitutional:  Negative for chills and fever.       Reports feeling tired  HENT:         Reports clear rhinorrhea and post nasal drip. Denies nasal congestion  Eyes:        Denies itchy watery eyes  Respiratory:  Positive for cough.        Reports dry cough every day, shortness of breath with exertion, and nocturnal awakenings due to breathing problems.  Denies wheezing and tightness in chest  Cardiovascular:  Positive for chest pain.       Reports a little bit of chest pain   Gastrointestinal:        Reports feeling a lump of phlegm while taking pantoprazole twice a day  Genitourinary:  Negative for frequency.  Skin:  Negative for itching and rash.  Neurological:  Positive for headaches.       Reports occasional headaches  Endo/Heme/Allergies:  Positive for environmental allergies.     Physical Exam: BP 118/68   Pulse 80   Temp 98.2 F (36.8 C)   Resp 18   Ht '5\' 5"'$  (1.651 m)   Wt 251 lb 12.8 oz (114.2 kg)   SpO2 96%   BMI 41.90 kg/m    Physical Exam HENT:     Head:     Comments: Pharynx normal, eyes normal, ears:  unable to see bilateral tympanic membranes due to cerumen, nose normal.    Right Ear: Ear canal and external ear normal.     Left Ear: Ear canal and external ear normal.     Nose: Nose normal.     Mouth/Throat:     Mouth: Mucous membranes are moist.     Pharynx: Oropharynx is clear.  Eyes:     Conjunctiva/sclera: Conjunctivae normal.  Cardiovascular:     Rate and Rhythm: Normal rate and regular rhythm.     Heart sounds: Normal heart sounds.  Pulmonary:     Effort: Pulmonary effort is normal.     Breath sounds: Normal breath sounds.     Comments: Lungs clear to auscultation Musculoskeletal:     Cervical back: Neck supple.  Skin:    General: Skin is warm.     Comments: Trace bilateral lower extremity edema noted.  Neurological:     Mental Status: She is oriented to person, place, and time.  Psychiatric:        Mood and Affect: Mood normal.        Behavior: Behavior normal.        Thought Content: Thought content normal.        Judgment: Judgment normal.     Diagnostics: FVC 1.37 L (53%), normal.  2 L (85%).  Predicted FVC 3.59 L, predicted FEV1 2.02 L.  Spirometry indicates possible moderate restriction.  She reports difficulty doing spirometry today due to shortness of breath.  2 view chest x-ray from 12/09/21 showing:" FINDINGS: Mild cardiomegaly is noted. Mild interstitial densities are noted in the perihilar and  basilar regions concerning for pulmonary edema. Bony thorax is unremarkable.   IMPRESSION: Mild bilateral interstitial densities are noted concerning for pulmonary edema."    "Echocardiogram 12/16/2021:  Left ventricle cavity is normal in size. Mild concentric hypertrophy of  the left ventricle. Normal global wall motion. Normal LV systolic function  with EF 59%. Doppler evidence of grade I (impaired) diastolic dysfunction,  normal LAP.  Left atrial cavity is mildly dilated.  No significant valvular abnormality,  IVC not well visualized. "  Assessment and Plan: 1. Asthma-COPD overlap syndrome   2. Seasonal and perennial allergic rhinoconjunctivitis   3. Gastroesophageal reflux disease, unspecified whether esophagitis present     Meds ordered this encounter  Medications   montelukast (SINGULAIR) 10 MG tablet    Sig: TAKE 1 TABLET(10 MG) BY MOUTH AT BEDTIME    Dispense:  90 tablet    Refill:  1    Patient Instructions  Asthma Discussed that I  felt that her current symptoms were due to to her diastolic heart failure  and that if her symptoms persist to going to the emergency room Continue montelukast 10 mg once a day to prevent cough or wheeze.  We will send in a refill Continue Breztri 2 puffs twice a day with a spacer to prevent cough or wheeze Continue albuterol 2 puffs every 4 hours as needed for cough or wheeze OR Instead use albuterol 0.083% solution via nebulizer one unit vial every 4 hours as needed for cough or wheeze Continue to avoid cigarette smoke Continue to consider Fasenra injections in the future if needed  Allergic rhinitis Continue allergen avoidance measures directed toward dust mite, cockroach, weed pollen, cat, and dog Continue allergen immunotherapy and have access to an epinephrine autoinjector set.  Please hold off on getting allergy injections until your shortness of breath gets better Continue Nasacort 1 to 2 sprays  in each nostril once a day as  needed for nasal congestion. Continue  azelastine 2 sprays in each nostril up to twice a day as needed for runny nose/drainage down throat Consider saline nasal rinses as needed for nasal symptoms. Use this before any medicated nasal sprays for best result. Increase saline rinses to twice a day for now Continue an antihistamine once a day as needed for runny nose or itch. Remember to rotate to a different antihistamine about every 3 months. Some examples of over the counter antihistamines include Zyrtec (cetirizine), Xyzal (levocetirizine), Allegra (fexofenadine), and Claritin (loratidine).   Allergic conjunctivitis Continue cromolyn 4% eyedrops.  Use 1 drop in each eye up to 4 times a day as needed for red, itchy, or watery eyes  Reflux Continue dietary and lifestyle modifications as listed below Continue pantoprazole as previously prescribed We will refer you to GI due to your continued symptoms despite taking pantoprazole twice a day   Follow up in 4 weeks or sooner if needed.   Lifestyle Changes for Controlling GERD When you have GERD, stomach acid feels as if it's backing up toward your mouth. Whether or not you take medication to control your GERD, your symptoms can often be improved with lifestyle changes.   Raise Your Head Reflux is more likely to strike when you're lying down flat, because stomach fluid can flow backward more easily. Raising the head of your bed 4-6 inches can help. To do this: Slide blocks or books under the legs at the head of your bed. Or, place a wedge under the mattress. Many foam stores can make a suitable wedge for you. The wedge should run from your waist to the top of your head. Don't just prop your head on several pillows. This increases pressure on your stomach. It can make GERD worse.  Watch Your Eating Habits Certain foods may increase the acid in your stomach or relax the lower esophageal sphincter, making GERD more likely. It's best to avoid  the following: Coffee, tea, and carbonated drinks (with and without caffeine) Fatty, fried, or spicy food Mint, chocolate, onions, and tomatoes Any other foods that seem to irritate your stomach or cause you pain  Relieve the Pressure Eat smaller meals, even if you have to eat more often. Don't lie down right after you eat. Wait a few hours for your stomach to empty. Avoid tight belts and tight-fitting clothes. Lose excess weight.  Tobacco and Alcohol Avoid smoking tobacco and drinking alcohol. They can make GERD symptoms worse.  Reducing Pollen Exposure The American Academy of Allergy, Asthma and Immunology suggests the following steps to reduce your exposure to pollen during allergy seasons. Do not hang sheets or clothing out to dry; pollen may collect on these items. Do not mow lawns or spend time around freshly cut grass; mowing stirs up pollen. Keep windows closed at night.  Keep car windows closed while driving. Minimize morning activities outdoors, a time when pollen counts are usually at their highest. Stay indoors as much as possible when pollen counts or humidity is high and on windy days when pollen tends to remain in the air longer. Use air conditioning when possible.  Many air conditioners have filters that trap the pollen spores. Use a HEPA room air filter to remove pollen form the indoor air you breathe.   Control of Dust Mite Allergen Dust mites play a major role in allergic asthma and rhinitis. They occur in environments with high humidity wherever human skin is found.  Dust mites absorb humidity from the atmosphere (ie, they do not drink) and feed on organic matter (including shed human and animal skin). Dust mites are a microscopic type of insect that you cannot see with the naked eye. High levels of dust mites have been detected from mattresses, pillows, carpets, upholstered furniture, bed covers, clothes, soft toys and any woven material. The principal allergen of the  dust mite is found in its feces. A gram of dust may contain 1,000 mites and 250,000 fecal particles. Mite antigen is easily measured in the air during house cleaning activities. Dust mites do not bite and do not cause harm to humans, other than by triggering allergies/asthma.  Ways to decrease your exposure to dust mites in your home:  1. Encase mattresses, box springs and pillows with a mite-impermeable barrier or cover  2. Wash sheets, blankets and drapes weekly in hot water (130 F) with detergent and dry them in a dryer on the hot setting.  3. Have the room cleaned frequently with a vacuum cleaner and a damp dust-mop. For carpeting or rugs, vacuuming with a vacuum cleaner equipped with a high-efficiency particulate air (HEPA) filter. The dust mite allergic individual should not be in a room which is being cleaned and should wait 1 hour after cleaning before going into the room.  4. Do not sleep on upholstered furniture (eg, couches).  5. If possible removing carpeting, upholstered furniture and drapery from the home is ideal. Horizontal blinds should be eliminated in the rooms where the person spends the most time (bedroom, study, television room). Washable vinyl, roller-type shades are optimal.  6. Remove all non-washable stuffed toys from the bedroom. Wash stuffed toys weekly like sheets and blankets above.  7. Reduce indoor humidity to less than 50%. Inexpensive humidity monitors can be purchased at most hardware stores. Do not use a humidifier as can make the problem worse and are not recommended.  Control of Dog or Cat Allergen Avoidance is the best way to manage a dog or cat allergy. If you have a dog or cat and are allergic to dog or cats, consider removing the dog or cat from the home. If you have a dog or cat but don't want to find it a new home, or if your family wants a pet even though someone in the household is allergic, here are some strategies that may help keep symptoms at  bay:  Keep the pet out of your bedroom and restrict it to only a few rooms. Be advised that keeping the dog or cat in only one room will not limit the allergens to that room. Don't pet, hug or kiss the dog or cat; if you do, wash your hands with soap and water. High-efficiency particulate air (HEPA) cleaners run continuously in a bedroom or living room can reduce allergen levels over time. Regular use of a high-efficiency vacuum cleaner or a central vacuum can reduce allergen levels. Giving your dog or cat a bath at least once a week can reduce airborne allergen.  Control of Cockroach Allergen Cockroach allergen has been identified as an important cause of acute attacks of asthma, especially in urban settings.  There are fifty-five species of cockroach that exist in the [place], however only three, the [place], [place] species produce allergen that can affect patients with Asthma.  Allergens can be obtained from fecal particles, egg casings and secretions from cockroaches.    Remove food sources. Reduce access to water. Seal access and entry  points. Spray runways with 0.5-1% Diazinon or Chlorpyrifos Blow boric acid power under stoves and refrigerator. Place bait stations (hydramethylnon) at feeding sites.  Return in about 4 weeks (around 01/13/2022), or if symptoms worsen or fail to improve.    Thank you for the opportunity to care for this patient.  Please do not hesitate to contact me with questions.  Althea Charon, FNP [facility] [place]

## 2021-12-16 NOTE — Progress Notes (Signed)
Primary Physician/Referring:  [facility]  Patient ID: Katrina Vega, female    DOB: [birth year], 66 y.o.   MRN: [id number]  Chief Complaint  Patient presents with   Coronary Artery Disease   Follow-up    HPI:    Katrina Vega  is a 66 y.o. Female patient with coronary artery disease and history of PCI to the LAD and  ramus in 2008 with implantation of Taxus stents, NSTEMI IN 2013 SP BALLOON ANGIOPLASTY TO SMALL RCA , chronic diastolic heart failure, hyperlipidemia, hypertension, diabetes mellitus with stage III chronic kidney disease, morbid obesity, OSA unable to tolerate CPAP,  peripheral arterial disease with right posterior tibial DES stent placed in 2008 and bilateral SFA stenting sometime in 2016 and atherectomy for ISR in 2019.  She was recently seen in the office last week following ED visit on 12/09/2021 for dyspnea with exertion and fluid volume overload. During last office visit, Lasix was increased to 40 mg twice daily. Since starting Lasix twice daily she has not been urinating frequently and still feels volume overloaded today.  She is still having dyspnea with exertion that has not progressively worsened and feels that her legs are swollen.  She currently using 2 pillows while sleeping.  Shortness of breath is not worse with lying flat. she denies chest pain, palpitations, PND, syncope.  Past Medical History:  Diagnosis Date   Asthma    Congestive heart failure (CHF) ([place]) 2019   COPD (chronic obstructive pulmonary disease) ([place]) 2021   Diabetes ([place]) 2010   Eczema    Heart attack ([place]) 2015   Heart disease 2008   History of left hip replacement 04/2018   Social History   Tobacco Use   Smoking status: Former    Packs/day: 0.50    Years: 15.00    Total pack years: 7.50    Types: Cigarettes    Quit date: 10/14/1995    Years since quitting: 26.1   Smokeless tobacco: Never  Substance Use Topics   Alcohol use: Not Currently   Marital Status: Single   ROS  Review of Systems  Cardiovascular:  Positive for dyspnea on exertion and leg swelling. Negative for chest pain (intermittent), claudication, orthopnea, palpitations and paroxysmal nocturnal dyspnea.  Gastrointestinal:  Negative for melena.  Neurological:  Negative for dizziness and light-headedness.   Objective  Blood pressure 124/62, pulse 75, temperature 98 F (36.7 C), temperature source Temporal, resp. rate 17, height 5' 5"  (1.651 m), weight 253 lb (114.8 kg), SpO2 96 %.     12/16/2021    2:01 PM 12/12/2021    1:35 PM 12/10/2021   12:00 PM  Vitals with BMI  Height 5' 5"  5' 5"    Weight 253 lbs 250 lbs   BMI 23.3 43.5   Systolic 686 168 372  Diastolic 62 62 70  Pulse 75 83 78     Physical Exam Constitutional:      Appearance: She is morbidly obese.     Comments: Morbidly obese in no acute distress.  Neck:     Vascular: No carotid bruit or JVD.  Cardiovascular:     Rate and Rhythm: Normal rate and regular rhythm.     Pulses:          Carotid pulses are 2+ on the right side and 2+ on the left side.      Radial pulses are 2+ on the right side and 2+ on the left side.       Femoral pulses  are 1+ on the right side and 1+ on the left side.      Dorsalis pedis pulses are 0 on the right side and 0 on the left side.       Posterior tibial pulses are 0 on the right side and 0 on the left side.     Heart sounds: Normal heart sounds. No murmur heard.    No gallop.  Pulmonary:     Effort: Pulmonary effort is normal.     Breath sounds: No wheezing or rales.  Abdominal:     General: Bowel sounds are normal.     Palpations: Abdomen is soft.  Musculoskeletal:     Right lower leg: Edema (trace) present.     Left lower leg: Edema (trace) present.    Laboratory examination:   Lipid Panel     Component Value Date/Time   CHOL 110 01/31/2021 1118   TRIG 66.0 01/31/2021 1118   HDL 36.70 (L) 01/31/2021 1118   CHOLHDL 3 01/31/2021 1118   VLDL 13.2 01/31/2021 1118   LDLCALC  60 01/31/2021 1118   HEMOGLOBIN A1C Lab Results  Component Value Date   HGBA1C 7.2 04/25/2021       Latest Ref Rng & Units 12/09/2021    4:13 PM 02/12/2021    8:49 AM  BMP  Glucose 70 - 99 mg/dL 143  86   BUN 8 - 23 mg/dL 12  15   Creatinine 0.44 - 1.00 mg/dL 1.14  0.99   Sodium 135 - 145 mmol/L 138  140   Potassium 3.5 - 5.1 mmol/L 3.8  3.9   Chloride 98 - 111 mmol/L 106  106   CO2 22 - 32 mmol/L 24  24   Calcium 8.9 - 10.3 mg/dL 9.8  9.4    CBC    Component Value Date/Time   WBC 5.5 12/09/2021 1613   RBC 4.76 12/09/2021 1613   HGB 12.5 12/09/2021 1613   HGB 13.2 09/24/2021 1037   HCT 39.7 12/09/2021 1613   PLT 267 12/09/2021 1613   PLT 277 09/24/2021 1037   MCV 83.4 12/09/2021 1613   MCH 26.3 12/09/2021 1613   MCHC 31.5 12/09/2021 1613   RDW 15.9 (H) 12/09/2021 1613   LYMPHSABS 2.1 09/24/2021 1037   MONOABS 0.4 09/24/2021 1037   EOSABS 0.1 09/24/2021 1037   BASOSABS 0.0 09/24/2021 1037   External labs:   Medications and allergies   Allergies  Allergen Reactions   Contrast Media [Iodinated Contrast Media] Shortness Of Breath and Other (See Comments)    sob, chest tight   Morphine Itching   Statins Diarrhea and Other (See Comments)    nausea, diarrhea, myalgias   Ioversol      Current Outpatient Medications:    albuterol (PROVENTIL) (2.5 MG/3ML) 0.083% nebulizer solution, Take 3 mLs (2.5 mg total) by nebulization every 6 (six) hours as needed for wheezing or shortness of breath., Disp: 75 mL, Rfl: 2   albuterol (VENTOLIN HFA) 108 (90 Base) MCG/ACT inhaler, Inhale 2 puffs into the lungs every 6 (six) hours as needed for wheezing., Disp: 18 g, Rfl: 1   azelastine (ASTELIN) 0.1 % nasal spray, 1-2 sprays per nostril 2 times daily as needed for runny nose., Disp: 90 mL, Rfl: 2   BD AUTOSHIELD DUO 30G X 5 MM MISC, 1 KIT BY MISC ROUTE 3 TIMES DAILY BEFORE MEALS, Disp: , Rfl:    benzonatate (TESSALON) 200 MG capsule, Take 1 capsule (200 mg total) by mouth 2 (two)  times daily as needed for cough., Disp: 20 capsule, Rfl: 0   Budeson-Glycopyrrol-Formoterol (BREZTRI AEROSPHERE) 160-9-4.8 MCG/ACT AERO, Inhale two puffs twice daily to prevent cough or wheeze.  Rinse, gargle, and spit after use., Disp: 32.1 g, Rfl: 1   bumetanide (BUMEX) 1 MG tablet, Take 1 tablet (1 mg total) by mouth daily., Disp: 30 tablet, Rfl: 3   celecoxib (CELEBREX) 200 MG capsule, Take by mouth., Disp: , Rfl:    cetirizine (ZYRTEC) 10 MG tablet, Take 1 tablet by mouth daily., Disp: , Rfl:    Cholecalciferol 50 MCG (2000 UT) TABS, Take by mouth., Disp: , Rfl:    clopidogrel (PLAVIX) 75 MG tablet, Take 1 tablet (75 mg total) by mouth daily., Disp: 90 tablet, Rfl: 3   Continuous Blood Gluc Sensor (DEXCOM G6 SENSOR) MISC, USE 1 SENSOR AS NEEDED CHANGE EVERY 10 DAYS, Disp: , Rfl:    cromolyn (OPTICROM) 4 % ophthalmic solution, Use 1 drop in each eye up to 4 times a day as needed for red, itchy, or watery eyes, Disp: 30 mL, Rfl: 1   diclofenac Sodium (VOLTAREN) 1 % GEL, Apply 4 grams topically 4 (four) times daily., Disp: 100 g, Rfl: 0   empagliflozin (JARDIANCE) 10 MG TABS tablet, Take 1 tablet (10 mg total) by mouth daily before breakfast., Disp: 30 tablet, Rfl: 3   EPINEPHrine (EPIPEN 2-PAK) 0.3 mg/0.3 mL IJ SOAJ injection, Use as directed for severe allergic reactions, Disp: 2 each, Rfl: 3   ferrous fumarate (HEMOCYTE - 106 MG FE) 325 (106 Fe) MG TABS tablet, Take by mouth., Disp: , Rfl:    gabapentin (NEURONTIN) 300 MG capsule, TAKE 1 CAPSULE BY MOUTH 3 TIMES  DAILY, Disp: 270 capsule, Rfl: 3   glucagon 1 MG injection, Inject 1 mg into the muscle once as needed (low blood sugar)., Disp: , Rfl:    glucose 4 GM chewable tablet, Chew 1 tablet by mouth once as needed for low blood sugar., Disp: , Rfl:    HUMALOG 100 UNIT/ML injection, Inject into the skin., Disp: , Rfl:    HUMULIN N 100 UNIT/ML injection, Inject 60 Units into the skin 3 (three) times daily before meals., Disp: , Rfl:     isosorbide mononitrate (IMDUR) 60 MG 24 hr tablet, Take 1 tablet (60 mg total) by mouth daily., Disp: 90 tablet, Rfl: 3   lisinopril (ZESTRIL) 20 MG tablet, Take 1 tablet (20 mg total) by mouth daily., Disp: 90 tablet, Rfl: 3   metoprolol tartrate (LOPRESSOR) 50 MG tablet, Take 1 tablet (50 mg total) by mouth every 12 (twelve) hours., Disp: 180 tablet, Rfl: 3   montelukast (SINGULAIR) 10 MG tablet, TAKE 1 TABLET(10 MG) BY MOUTH AT BEDTIME, Disp: 90 tablet, Rfl: 1   nitroGLYCERIN (NITROSTAT) 0.4 MG SL tablet, Place 1 tablet (0.4 mg total) under the tongue every 5 (five) minutes as needed for chest pain., Disp: 25 tablet, Rfl: 3   OZEMPIC, 2 MG/DOSE, 8 MG/3ML SOPN, Inject into the skin., Disp: , Rfl:    pantoprazole (PROTONIX) 40 MG tablet, TAKE 1 TABLET BY MOUTH IN THE  MORNING AND AT BEDTIME, Disp: 200 tablet, Rfl: 2   potassium chloride (KLOR-CON) 10 MEQ tablet, Take 1 tablet (10 mEq total) by mouth daily., Disp: 90 tablet, Rfl: 3   psyllium (METAMUCIL) 58.6 % packet, Take 1 packet by mouth daily as needed (regularity)., Disp: , Rfl:    Respiratory Therapy Supplies (CARETOUCH 2 CPAP HOSE HANGER) MISC, by Does not apply route., Disp: ,  Rfl:    rosuvastatin (CRESTOR) 40 MG tablet, Take 1 tablet (40 mg total) by mouth at bedtime., Disp: 90 tablet, Rfl: 3   tiZANidine (ZANAFLEX) 4 MG tablet, Take by mouth., Disp: , Rfl:     Radiology:   Chest xray 12/09/21: FINDINGS: Mild cardiomegaly is noted. Mild interstitial densities are noted in the perihilar and basilar regions concerning for pulmonary edema. Bony thorax is unremarkable.   IMPRESSION: Mild bilateral interstitial densities are noted concerning for pulmonary edema.  Cardiac Studies:   Coronary angioplasty: -RCA PCI (Dr. Tonye Becket) 10/2011 for NSTEMI; LAD/[place] DES 2008 Taxus;  Coronary angiogram 12/22/2017: A- Left Main: Mild plaquing, no critical lesions seen.  B- LAD: The LAD contains mild plaquing proximally and gives rise to a large  high lying 1st diagonal branch in which a 40% ostial stenosis is present. Mid LAD contains a well deployed stent which is patent (2008 Taxus), no evidence for significant restenoses. The distal LAD is very large and free of significant disease.  C- Left Circumflex: Limited views of the left circumflex were obtained due to dye issues. There appears to be a proximal left circumflex and 1st obtuse marginal stented segments which are patent (Taxus 2008) and show no it evidence for significant restenoses.  D- RCA: The RCA is small and contains a patent proximal stent with 50 to 60% mid stenosis and mild plaquing noted distally.  PTCA site from 2013 is patent.  Peripheral arteriogram 12/22/2017:  There are bilateral SFA stents present with evidence for severe right and mild left in stent restenoses present. The bilateral popliteal arteries and proximal trifurcation vessels are patent. However, there may be moderate to severe lesion involving the mid right anterior tibial artery in particular. In addition, there was poor flow noted in the left anterior tibial artery with 2 vessel runoff probably present bilaterally. Laser atherectomy of right SFA followed by DCB with 5.0 x 120 mm impact.  Echocardiogram 07/04/2019: Normal LV size, mild LVH, hyperdynamic LVEF at >65% with grade 1 diastolic dysfunction.  Otherwise no other significant abnormality.  Poor quality due to patient body habitus.  Lexiscan nuclear stress test 07/07/2019: Nondiagnostic EKG.  Dyspnea during stress test. No evidence of ischemia.  Soft tissue attenuation noted.  LVEF 72%.  Normal wall motion.  Low risk study.  No significant change from 12/20/2017.  Lower extremity venous duplex 07/02/2019: No evidence of DVT in bilateral lower extremity.  Lower Extremity Arterial Duplex 10/09/2020: Right mid and distal SFA occluded, dampened flow in the popliteal artery via collaterals. Moderate velocity increase at the right profunda femoral artery.   No hemodynamically significant stenoses are identified in the left lower extremity arterial system. This exam reveals normal perfusion of the right lower extremity (ABI 0.97) with monophasic waveform in the PT This exam reveals mildly decreased perfusion of the left lower extremity, noted at the post tibial artery level (ABI 0.90) with monophasic waveform in the left AT.  Compared to 12/16/2019, right SFA stenosis is new.  No significant change in ABI.  Abdominal Aortic Duplex 10/09/2020: The maximum aorta (sac) diameter is 2.51 cm (prox) with mild ectasia in the proximal aorta. Diffuse plaque observed in the proximal, mid and distal aorta.  Bilateral CIA are not well visualized but appear to be severely stenosed with diffuse plaque, however, the flow velocity appears normal.  Clinical correlation recommended. Consider CTA or catheter directed angiography if clinically indicated.   EKG:   EKG 10/17/2021.  Normal sinus rhythm at rate of 72 bpm,  normal axis, no evidence of ischemia, normal EKG. No significant change from 04/19/2021.   Assessment     ICD-10-CM   1. Chronic diastolic congestive heart failure (HCC)  I50.32 empagliflozin (JARDIANCE) 10 MG TABS tablet    bumetanide (BUMEX) 1 MG tablet    2. Dyspnea on exertion  R06.09 bumetanide (BUMEX) 1 MG tablet    3. Class 3 severe obesity due to excess calories with serious comorbidity and body mass index (BMI) of 40.0 to 44.9 in adult [facility])  E66.01    Z68.41       Meds ordered this encounter  Medications   empagliflozin (JARDIANCE) 10 MG TABS tablet    Sig: Take 1 tablet (10 mg total) by mouth daily before breakfast.    Dispense:  30 tablet    Refill:  3    Order Specific Question:   Supervising Provider    Answer:   Adrian Prows [[id number]]   bumetanide (BUMEX) 1 MG tablet    Sig: Take 1 tablet (1 mg total) by mouth daily.    Dispense:  30 tablet    Refill:  3    Order Specific Question:   Supervising Provider    Answer:   Adrian Prows  [[id number]]    No orders of the defined types were placed in this encounter.  Recommendations:   Katrina Vega is a 66 y.o. Female patient with coronary artery disease and history of PCI to the LAD and  ramus in 2008 with implantation of Taxus stents, NSTEMI IN 2013 SP BALLOON ANGIOPLASTY TO SMALL RCA , chronic diastolic heart failure, hyperlipidemia, hypertension, diabetes mellitus with stage III chronic kidney disease, morbid obesity, OSA unable to tolerate CPAP,  peripheral arterial disease with right posterior tibial DES stent placed in 2008 and bilateral SFA stenting sometime in 2016 and atherectomy for ISR in 2019.  Dyspnea on exertion Chronic diastolic congestive heart failure ([place]) Since she is not having adequate diuresis with Lasix we will stop at this and start Bumex 1 mg daily.  We will also restart Jardiance 10 mg daily. CMP ordered, advised patient to have lab work completed in 1 week. Continue potassium supplements as previously ordered. Advised patient to monitor weight daily and keep log of readings.  Class 3 severe obesity due to excess calories with serious comorbidity and body mass index (BMI) of 40.0 to 44.9 in adult [facility]) Previous labs reviewed, creatinine 1.14, GFR 53. She is followed by nephrology. Will repeat lab work in 1 week.  Follow-up in 2 weeks or sooner if needed.   Ernst Spell, AGNP-C 12/16/2021, 2:52 PM Office: [phone]

## 2021-12-17 ENCOUNTER — Encounter: Payer: Self-pay | Admitting: Family

## 2021-12-17 ENCOUNTER — Ambulatory Visit (INDEPENDENT_AMBULATORY_CARE_PROVIDER_SITE_OTHER): Payer: Medicare Other | Admitting: Family

## 2021-12-17 ENCOUNTER — Ambulatory Visit (HOSPITAL_BASED_OUTPATIENT_CLINIC_OR_DEPARTMENT_OTHER)
Admission: RE | Admit: 2021-12-17 | Discharge: 2021-12-17 | Disposition: A | Discharge status: Home / Self Care | Payer: Medicare Other | Source: Ambulatory Visit | Attending: Family | Admitting: Family

## 2021-12-17 VITALS — BP 130/70 | HR 81 | Temp 98.0°F | Ht 65.0 in | Wt 251.4 lb

## 2021-12-17 DIAGNOSIS — I509 Heart failure, unspecified: Secondary | ICD-10-CM | POA: Diagnosis not present

## 2021-12-17 DIAGNOSIS — J811 Chronic pulmonary edema: Secondary | ICD-10-CM | POA: Diagnosis not present

## 2021-12-17 DIAGNOSIS — R0602 Shortness of breath: Secondary | ICD-10-CM | POA: Diagnosis not present

## 2021-12-17 DIAGNOSIS — J810 Acute pulmonary edema: Secondary | ICD-10-CM | POA: Diagnosis not present

## 2021-12-17 DIAGNOSIS — R079 Chest pain, unspecified: Secondary | ICD-10-CM | POA: Diagnosis not present

## 2021-12-17 DIAGNOSIS — Z23 Encounter for immunization: Secondary | ICD-10-CM | POA: Diagnosis not present

## 2021-12-17 LAB — COMPREHENSIVE METABOLIC PANEL
ALT: 12 U/L (ref 0–35)
AST: 15 U/L (ref 0–37)
Albumin: 4.6 g/dL (ref 3.5–5.2)
Alkaline Phosphatase: 62 U/L (ref 39–117)
BUN: 18 mg/dL (ref 6–23)
CO2: 30 mEq/L (ref 19–32)
Calcium: 10.6 mg/dL — ABNORMAL HIGH (ref 8.4–10.5)
Chloride: 102 mEq/L (ref 96–112)
Creatinine, Ser: 1.44 mg/dL — ABNORMAL HIGH (ref 0.40–1.20)
GFR: 37.53 mL/min — ABNORMAL LOW (ref >60.00)
Glucose, Bld: 110 mg/dL — ABNORMAL HIGH (ref 70–99)
Potassium: 4.3 mEq/L (ref 3.5–5.1)
Sodium: 138 mEq/L (ref 135–145)
Total Bilirubin: 0.5 mg/dL (ref 0.2–1.2)
Total Protein: 7.3 g/dL (ref 6.0–8.3)

## 2021-12-17 NOTE — Progress Notes (Signed)
Katrina Vega is a 66 y.o. female with the following history as recorded in EpicCare:  Patient Active Problem List   Diagnosis Date Noted   Viral syndrome 11/05/2021   Lumbar spondylosis 09/06/2021   Not well controlled moderate persistent asthma 03/15/2021   Allergic conjunctivitis of both eyes 03/15/2021   Plantar flexed metatarsal bone of left foot 11/21/2020   Plantar flexed metatarsal bone of right foot 11/21/2020   AKI (acute kidney injury) ([place]) 10/10/2020   History of COVID-19 10/10/2020   Secondary hyperparathyroidism of renal origin ([place]) 10/10/2020   Vitamin D deficiency 10/10/2020   Heartburn 09/24/2020   Microscopic hematuria 08/02/2020   Proteinuria 08/02/2020   H/O deep venous thrombosis 03/23/2020   Seasonal and perennial allergic rhinitis 02/29/2020   Seasonal and perennial allergic rhinoconjunctivitis 02/29/2020   Asthma-COPD overlap syndrome 02/29/2020   Status post total shoulder arthroplasty, right 12/23/2019   Adrenal incidentaloma ([place]) 10/26/2019   DM cataract ([place]) 10/26/2019   Osteoarthritis of right glenohumeral joint 09/27/2019   CKD stage 3 due to type 2 diabetes mellitus ([place]) 09/22/2019   Lung nodule 07/01/2019   Hoarseness of voice 03/28/2019   Anemia due to vitamin B12 deficiency 12/29/2018   Nausea 07/01/2018   Atherosclerotic heart disease of native coronary artery without angina pectoris 05/11/2018   Hyperlipidemia 05/11/2018   Pneumonia 05/11/2018   Reactive airway disease 05/11/2018   Chronic kidney disease, stage 2 (mild) 05/11/2018   S/P hip replacement, left 05/06/2018   Degenerative joint disease of left hip 05/05/2018   Carpal tunnel syndrome of right wrist 10/11/2017   Diarrhea due to malabsorption 09/02/2017   Chronic fatigue 01/07/2017   Osteoarthritis of multiple joints 01/07/2017   Chronic diastolic congestive heart failure ([place]) 01/01/2017   Impingement syndrome of left shoulder 12/29/2016   Precordial pain 10/08/2016    Morbid obesity ([place]) 10/08/2016   Lumbar radiculopathy 08/06/2016   Acute renal failure superimposed on stage 3 chronic kidney disease ([place]) 05/23/2016   Delayed gastric emptying 03/20/2016   Adrenal adenoma, left 10/14/2015   Gastroesophageal reflux disease 10/12/2015   Mild intermittent asthma 08/21/2015   Shortness of breath 07/06/2015   Obstructive sleep apnea (adult) (pediatric) 03/12/2015   PAD (peripheral artery disease) ([place]) 02/01/2015   Cough 10/23/2014   Diabetes mellitus with neurological manifestations ([place]) 09/09/2012   Type 2 diabetes mellitus, with long-term current use of insulin ([place]) 09/10/2011   Anxiety state 03/31/2011   Essential hypertension 01/20/2011   Disorder of intervertebral disc 06/26/2009    Current Outpatient Medications  Medication Sig Dispense Refill   albuterol (PROVENTIL) (2.5 MG/3ML) 0.083% nebulizer solution Take 3 mLs (2.5 mg total) by nebulization every 6 (six) hours as needed for wheezing or shortness of breath. 75 mL 2   albuterol (VENTOLIN HFA) 108 (90 Base) MCG/ACT inhaler Inhale 2 puffs into the lungs every 6 (six) hours as needed for wheezing. 18 g 1   azelastine (ASTELIN) 0.1 % nasal spray 1-2 sprays per nostril 2 times daily as needed for runny nose. 90 mL 2   BD AUTOSHIELD DUO 30G X 5 MM MISC 1 KIT BY MISC ROUTE 3 TIMES DAILY BEFORE MEALS     benzonatate (TESSALON) 200 MG capsule Take 1 capsule (200 mg total) by mouth 2 (two) times daily as needed for cough. 20 capsule 0   bumetanide (BUMEX) 1 MG tablet Take 1 tablet (1 mg total) by mouth daily. 30 tablet 3   cetirizine (ZYRTEC) 10 MG tablet Take 1 tablet by mouth  daily.     Cholecalciferol 50 MCG (2000 UT) TABS Take by mouth.     clopidogrel (PLAVIX) 75 MG tablet Take 1 tablet (75 mg total) by mouth daily. 90 tablet 3   Continuous Blood Gluc Sensor (DEXCOM G6 SENSOR) MISC USE 1 SENSOR AS NEEDED CHANGE EVERY 10 DAYS     cromolyn (OPTICROM) 4 % ophthalmic solution Use 1 drop in each eye up  to 4 times a day as needed for red, itchy, or watery eyes 30 mL 1   diclofenac Sodium (VOLTAREN) 1 % GEL Apply 4 grams topically 4 (four) times daily. 100 g 0   empagliflozin (JARDIANCE) 10 MG TABS tablet Take 1 tablet (10 mg total) by mouth daily before breakfast. 30 tablet 3   EPINEPHrine (EPIPEN 2-PAK) 0.3 mg/0.3 mL IJ SOAJ injection Use as directed for severe allergic reactions 2 each 3   gabapentin (NEURONTIN) 300 MG capsule TAKE 1 CAPSULE BY MOUTH 3 TIMES  DAILY 270 capsule 3   glucagon 1 MG injection Inject 1 mg into the muscle once as needed (low blood sugar).     glucose 4 GM chewable tablet Chew 1 tablet by mouth once as needed for low blood sugar.     HUMALOG 100 UNIT/ML injection Inject into the skin.     isosorbide mononitrate (IMDUR) 60 MG 24 hr tablet Take 1 tablet (60 mg total) by mouth daily. 90 tablet 3   lisinopril (ZESTRIL) 20 MG tablet Take 1 tablet (20 mg total) by mouth daily. 90 tablet 3   metoprolol tartrate (LOPRESSOR) 50 MG tablet Take 1 tablet (50 mg total) by mouth every 12 (twelve) hours. 180 tablet 3   montelukast (SINGULAIR) 10 MG tablet TAKE 1 TABLET(10 MG) BY MOUTH AT BEDTIME 90 tablet 1   nitroGLYCERIN (NITROSTAT) 0.4 MG SL tablet Place 1 tablet (0.4 mg total) under the tongue every 5 (five) minutes as needed for chest pain. 25 tablet 3   OZEMPIC, 2 MG/DOSE, 8 MG/3ML SOPN Inject into the skin.     pantoprazole (PROTONIX) 40 MG tablet TAKE 1 TABLET BY MOUTH IN THE  MORNING AND AT BEDTIME 200 tablet 2   potassium chloride (KLOR-CON) 10 MEQ tablet Take 1 tablet (10 mEq total) by mouth daily. 90 tablet 3   psyllium (METAMUCIL) 58.6 % packet Take 1 packet by mouth daily as needed (regularity).     Respiratory Therapy Supplies (CARETOUCH 2 CPAP HOSE HANGER) MISC by Does not apply route.     rosuvastatin (CRESTOR) 40 MG tablet Take 1 tablet (40 mg total) by mouth at bedtime. 90 tablet 3   tiZANidine (ZANAFLEX) 4 MG tablet Take by mouth.      Budeson-Glycopyrrol-Formoterol (BREZTRI AEROSPHERE) 160-9-4.8 MCG/ACT AERO Inhale two puffs twice daily to prevent cough or wheeze.  Rinse, gargle, and spit after use. (Patient not taking: Reported on 12/17/2021) 32.1 g 1   celecoxib (CELEBREX) 200 MG capsule Take by mouth. (Patient not taking: Reported on 12/17/2021)     ferrous fumarate (HEMOCYTE - 106 MG FE) 325 (106 Fe) MG TABS tablet Take by mouth. (Patient not taking: Reported on 12/17/2021)     HUMULIN N 100 UNIT/ML injection Inject 60 Units into the skin 3 (three) times daily before meals. (Patient not taking: Reported on 12/17/2021)     No current facility-administered medications for this visit.    Allergies: Contrast media [iodinated contrast media], Morphine, Statins, and Ioversol  Past Medical History:  Diagnosis Date   Asthma    Congestive heart  failure (CHF) ([place]) 2019   COPD (chronic obstructive pulmonary disease) ([place]) 2021   Diabetes ([place]) 2010   Eczema    Heart attack ([place]) 2015   Heart disease 2008   History of left hip replacement 04/2018    Past Surgical History:  Procedure Laterality Date   APPENDECTOMY  1974   BIOPSY  02/22/2021   Procedure: BIOPSY;  Surgeon: Carol Ada, MD;  Location: [facility];  Service: Endoscopy;;   CARPAL TUNNEL RELEASE  2017   COLONOSCOPY WITH PROPOFOL N/A 02/22/2021   Procedure: COLONOSCOPY WITH PROPOFOL;  Surgeon: Carol Ada, MD;  Location: [facility];  Service: Endoscopy;  Laterality: N/A;   ESOPHAGOGASTRODUODENOSCOPY (EGD) WITH PROPOFOL N/A 02/22/2021   Procedure: ESOPHAGOGASTRODUODENOSCOPY (EGD) WITH PROPOFOL;  Surgeon: Carol Ada, MD;  Location: [facility];  Service: Endoscopy;  Laterality: N/A;   LUMBAR SPINE SURGERY  2010   POLYPECTOMY  02/22/2021   Procedure: POLYPECTOMY;  Surgeon: Carol Ada, MD;  Location: [facility];  Service: Endoscopy;;   REPLACEMENT TOTAL KNEE  2017 and 2018   TONSILLECTOMY     TOTAL ABDOMINAL HYSTERECTOMY  1989   TOTAL HIP  ARTHROPLASTY  04/2018   TOTAL SHOULDER ARTHROPLASTY  11/2019    Family History  Problem Relation Age of Onset   Heart disease Mother    Breast cancer Mother    Heart attack Father    Lung cancer Father    Eczema Grandson    Allergic rhinitis Neg Hx    Angioedema Neg Hx    Asthma Neg Hx    Atopy Neg Hx    Immunodeficiency Neg Hx    Urticaria Neg Hx     Social History   Tobacco Use   Smoking status: Former    Packs/day: 0.50    Years: 15.00    Total pack years: 7.50    Types: Cigarettes    Quit date: 10/14/1995    Years since quitting: 26.1   Smokeless tobacco: Never  Substance Use Topics   Alcohol use: Not Currently    Subjective:  Patient was seen at ER on 10/16 with CHF exacerbation; has had close follow up with her cardiologist and asthma/ allergy specialist in the past week; Did see cardiology yesterday and has been changed to Bumex and Jardiance; has not started the Bumex at this time- planning to pick up at pharmacy today;  Has seen her asthma specialist in the past week as well- asthma felt to be well controlled; Continuing to work with nephrology and endocrinology as well;      Objective:  Vitals:   12/17/21 1029  BP: 130/70  Pulse: 81  Temp: 98 F (36.7 C)  TempSrc: Oral  SpO2: 99%  Weight: 251 lb 6.4 oz (114 kg)  Height: 5' 5"  (1.651 m)    General: Well developed, well nourished, in no acute distress  Skin : Warm and dry.  Head: Normocephalic and atraumatic  Eyes: Sclera and conjunctiva clear; pupils round and reactive to light; extraocular movements intact  Ears: External normal; canals clear; tympanic membranes normal  Oropharynx: Pink, supple. No suspicious lesions  Neck: Supple without thyromegaly, adenopathy  Lungs: Respirations unlabored; clear to auscultation bilaterally without wheeze, rales, rhonchi  CVS exam: normal rate and regular rhythm.  Neurologic: Alert and oriented; speech intact; face symmetrical; moves all extremities well;  CNII-XII intact without focal deficit   Assessment:  1. Congestive heart failure, unspecified HF chronicity, unspecified heart failure type ([place])   2. Acute pulmonary edema (HCC)  3. Need for immunization against influenza     Plan:  Under close follow up with her cardiologist- saw them yesterday for 2nd outpatient follow up; agree with planned medication changes; will update CXR and CMP today;  Keep planned follow up here for December 2023;   Flu shot given today;   Time spent 30 minutes reviewing notes/ discussing treatment plan  No follow-ups on file.  Orders Placed This Encounter  Procedures   DG Chest 2 View    Standing Status:   Future    Number of Occurrences:   1    Standing Expiration Date:   12/18/2022    Order Specific Question:   Reason for Exam (SYMPTOM  OR DIAGNOSIS REQUIRED)    Answer:   pulmonary edema    Order Specific Question:   Preferred imaging location?    Answer:   [facility]   Flu Vaccine QUAD High Dose(Fluad)   Comp Met (CMET)    Requested Prescriptions    No prescriptions requested or ordered in this encounter

## 2021-12-18 ENCOUNTER — Telehealth: Payer: Self-pay | Admitting: Family

## 2021-12-18 NOTE — Telephone Encounter (Signed)
Pt stated she would like a referral to Reather Littler, MD. [phone]

## 2021-12-19 ENCOUNTER — Other Ambulatory Visit: Payer: Self-pay | Admitting: Family

## 2021-12-19 ENCOUNTER — Encounter (HOSPITAL_BASED_OUTPATIENT_CLINIC_OR_DEPARTMENT_OTHER): Payer: Self-pay

## 2021-12-19 ENCOUNTER — Emergency Department (HOSPITAL_BASED_OUTPATIENT_CLINIC_OR_DEPARTMENT_OTHER): Payer: Medicare Other

## 2021-12-19 ENCOUNTER — Other Ambulatory Visit: Payer: Self-pay

## 2021-12-19 ENCOUNTER — Telehealth: Payer: Self-pay

## 2021-12-19 ENCOUNTER — Observation Stay (HOSPITAL_BASED_OUTPATIENT_CLINIC_OR_DEPARTMENT_OTHER)
Admission: EM | Admit: 2021-12-19 | Discharge: 2021-12-22 | Disposition: A | Discharge status: Home / Self Care | Payer: Medicare Other | Attending: Family Medicine | Admitting: Family Medicine

## 2021-12-19 DIAGNOSIS — I130 Hypertensive heart and chronic kidney disease with heart failure and stage 1 through stage 4 chronic kidney disease, or unspecified chronic kidney disease: Secondary | ICD-10-CM | POA: Diagnosis not present

## 2021-12-19 DIAGNOSIS — I10 Essential (primary) hypertension: Secondary | ICD-10-CM | POA: Diagnosis present

## 2021-12-19 DIAGNOSIS — R0600 Dyspnea, unspecified: Secondary | ICD-10-CM

## 2021-12-19 DIAGNOSIS — Z7984 Long term (current) use of oral hypoglycemic drugs: Secondary | ICD-10-CM | POA: Diagnosis not present

## 2021-12-19 DIAGNOSIS — J4489 Other specified chronic obstructive pulmonary disease: Secondary | ICD-10-CM | POA: Diagnosis present

## 2021-12-19 DIAGNOSIS — Z6841 Body Mass Index (BMI) 40.0 and over, adult: Secondary | ICD-10-CM | POA: Diagnosis not present

## 2021-12-19 DIAGNOSIS — N1832 Chronic kidney disease, stage 3b: Secondary | ICD-10-CM

## 2021-12-19 DIAGNOSIS — Z1152 Encounter for screening for COVID-19: Secondary | ICD-10-CM | POA: Diagnosis not present

## 2021-12-19 DIAGNOSIS — I739 Peripheral vascular disease, unspecified: Secondary | ICD-10-CM

## 2021-12-19 DIAGNOSIS — R079 Chest pain, unspecified: Principal | ICD-10-CM

## 2021-12-19 DIAGNOSIS — Z794 Long term (current) use of insulin: Secondary | ICD-10-CM | POA: Diagnosis not present

## 2021-12-19 DIAGNOSIS — R918 Other nonspecific abnormal finding of lung field: Secondary | ICD-10-CM | POA: Insufficient documentation

## 2021-12-19 DIAGNOSIS — R2681 Unsteadiness on feet: Secondary | ICD-10-CM | POA: Insufficient documentation

## 2021-12-19 DIAGNOSIS — Z87891 Personal history of nicotine dependence: Secondary | ICD-10-CM | POA: Diagnosis not present

## 2021-12-19 DIAGNOSIS — R0609 Other forms of dyspnea: Secondary | ICD-10-CM

## 2021-12-19 DIAGNOSIS — N189 Chronic kidney disease, unspecified: Secondary | ICD-10-CM

## 2021-12-19 DIAGNOSIS — I2510 Atherosclerotic heart disease of native coronary artery without angina pectoris: Secondary | ICD-10-CM

## 2021-12-19 DIAGNOSIS — G4733 Obstructive sleep apnea (adult) (pediatric): Secondary | ICD-10-CM | POA: Diagnosis not present

## 2021-12-19 DIAGNOSIS — Z9641 Presence of insulin pump (external) (internal): Secondary | ICD-10-CM | POA: Insufficient documentation

## 2021-12-19 DIAGNOSIS — N179 Acute kidney failure, unspecified: Secondary | ICD-10-CM | POA: Insufficient documentation

## 2021-12-19 DIAGNOSIS — E119 Type 2 diabetes mellitus without complications: Secondary | ICD-10-CM

## 2021-12-19 DIAGNOSIS — Z79899 Other long term (current) drug therapy: Secondary | ICD-10-CM | POA: Insufficient documentation

## 2021-12-19 DIAGNOSIS — R0602 Shortness of breath: Secondary | ICD-10-CM | POA: Insufficient documentation

## 2021-12-19 DIAGNOSIS — N1831 Chronic kidney disease, stage 3a: Secondary | ICD-10-CM | POA: Diagnosis not present

## 2021-12-19 DIAGNOSIS — E1122 Type 2 diabetes mellitus with diabetic chronic kidney disease: Secondary | ICD-10-CM | POA: Diagnosis not present

## 2021-12-19 DIAGNOSIS — I5032 Chronic diastolic (congestive) heart failure: Secondary | ICD-10-CM | POA: Diagnosis present

## 2021-12-19 DIAGNOSIS — I509 Heart failure, unspecified: Secondary | ICD-10-CM

## 2021-12-19 HISTORY — DX: Presence of insulin pump (external) (internal): Z96.41

## 2021-12-19 LAB — BASIC METABOLIC PANEL
Anion gap: 11 (ref 5–15)
BUN: 25 mg/dL — ABNORMAL HIGH (ref 8–23)
CO2: 24 mmol/L (ref 22–32)
Calcium: 10.7 mg/dL — ABNORMAL HIGH (ref 8.9–10.3)
Chloride: 102 mmol/L (ref 98–111)
Creatinine, Ser: 2.21 mg/dL — ABNORMAL HIGH (ref 0.44–1.00)
GFR, Estimated: 24 mL/min — ABNORMAL LOW (ref >60)
Glucose, Bld: 130 mg/dL — ABNORMAL HIGH (ref 70–99)
Potassium: 3.9 mmol/L (ref 3.5–5.1)
Sodium: 137 mmol/L (ref 135–145)

## 2021-12-19 LAB — CBC
HCT: 43.8 % (ref 36.0–46.0)
Hemoglobin: 14 g/dL (ref 12.0–15.0)
MCH: 26.3 pg (ref 26.0–34.0)
MCHC: 32 g/dL (ref 30.0–36.0)
MCV: 82.3 fL (ref 80.0–100.0)
Platelets: 351 10*3/uL (ref 150–400)
RBC: 5.32 MIL/uL — ABNORMAL HIGH (ref 3.87–5.11)
RDW: 15.8 % — ABNORMAL HIGH (ref 11.5–15.5)
WBC: 5.5 10*3/uL (ref 4.0–10.5)
nRBC: 0 % (ref 0.0–0.2)

## 2021-12-19 LAB — PROTIME-INR
INR: 1 (ref 0.8–1.2)
Prothrombin Time: 13.5 seconds (ref 11.4–15.2)

## 2021-12-19 LAB — TROPONIN I (HIGH SENSITIVITY): Troponin I (High Sensitivity): 3 ng/L (ref <18)

## 2021-12-19 NOTE — Telephone Encounter (Signed)
Please advise on pt pt request.

## 2021-12-19 NOTE — Telephone Encounter (Signed)
Patient stated that after taking the Bumex she has been having tightness in her chest and "just wants to sleep all the time".  She also complained of "some" lightheadedness, but bad. The symptoms do not start until after 3 hours of taking the medication. Patient stated that she had her PCP take another xray and it came back normal. Please advise.

## 2021-12-19 NOTE — ED Notes (Signed)
Dk. Grn tube also in lab

## 2021-12-19 NOTE — ED Triage Notes (Signed)
Pt reports SOB starting 1 week ago, was seen at [facility] ED and was put on lasix prescription for pleural effusion. Echo done on Monday, was told she had diastolic dysfunction 1. Progressively worsening, feels worse when lying done. CP intermittent x1 week in center of chest, pressure-like. Intermittent sharp feeling. 5/10 currently. Denies N/V. Weakness/fatigue reported. Hx of 2 cardiac stents, HF, COPD, asthma. Not on home O2.

## 2021-12-19 NOTE — Telephone Encounter (Signed)
I have called the pt and informed her that we have sent in the referral for the new cardiologist and relayed the message for the provider to keep scheduled appt with current Cards doctor since it may be a month or two until she is established with her new provider for Cards. I have also given her lab results and she stated understanding.

## 2021-12-20 ENCOUNTER — Other Ambulatory Visit: Payer: Medicare Other

## 2021-12-20 ENCOUNTER — Encounter (HOSPITAL_COMMUNITY): Payer: Self-pay

## 2021-12-20 ENCOUNTER — Encounter (HOSPITAL_BASED_OUTPATIENT_CLINIC_OR_DEPARTMENT_OTHER): Payer: Self-pay | Admitting: Internal Medicine

## 2021-12-20 DIAGNOSIS — Z87891 Personal history of nicotine dependence: Secondary | ICD-10-CM | POA: Diagnosis not present

## 2021-12-20 DIAGNOSIS — G4733 Obstructive sleep apnea (adult) (pediatric): Secondary | ICD-10-CM | POA: Diagnosis not present

## 2021-12-20 DIAGNOSIS — N1831 Chronic kidney disease, stage 3a: Secondary | ICD-10-CM | POA: Diagnosis not present

## 2021-12-20 DIAGNOSIS — Z794 Long term (current) use of insulin: Secondary | ICD-10-CM | POA: Diagnosis not present

## 2021-12-20 DIAGNOSIS — R079 Chest pain, unspecified: Secondary | ICD-10-CM | POA: Diagnosis not present

## 2021-12-20 DIAGNOSIS — I10 Essential (primary) hypertension: Secondary | ICD-10-CM | POA: Diagnosis not present

## 2021-12-20 DIAGNOSIS — I5032 Chronic diastolic (congestive) heart failure: Secondary | ICD-10-CM | POA: Diagnosis not present

## 2021-12-20 DIAGNOSIS — Z9641 Presence of insulin pump (external) (internal): Secondary | ICD-10-CM | POA: Diagnosis not present

## 2021-12-20 DIAGNOSIS — Z79899 Other long term (current) drug therapy: Secondary | ICD-10-CM | POA: Diagnosis not present

## 2021-12-20 DIAGNOSIS — N179 Acute kidney failure, unspecified: Secondary | ICD-10-CM | POA: Diagnosis not present

## 2021-12-20 DIAGNOSIS — R0602 Shortness of breath: Secondary | ICD-10-CM | POA: Diagnosis not present

## 2021-12-20 DIAGNOSIS — N189 Chronic kidney disease, unspecified: Secondary | ICD-10-CM

## 2021-12-20 DIAGNOSIS — N1832 Chronic kidney disease, stage 3b: Secondary | ICD-10-CM

## 2021-12-20 DIAGNOSIS — Z7984 Long term (current) use of oral hypoglycemic drugs: Secondary | ICD-10-CM | POA: Diagnosis not present

## 2021-12-20 DIAGNOSIS — R918 Other nonspecific abnormal finding of lung field: Secondary | ICD-10-CM | POA: Diagnosis not present

## 2021-12-20 DIAGNOSIS — E1122 Type 2 diabetes mellitus with diabetic chronic kidney disease: Secondary | ICD-10-CM | POA: Diagnosis not present

## 2021-12-20 DIAGNOSIS — I130 Hypertensive heart and chronic kidney disease with heart failure and stage 1 through stage 4 chronic kidney disease, or unspecified chronic kidney disease: Secondary | ICD-10-CM | POA: Diagnosis not present

## 2021-12-20 DIAGNOSIS — R2681 Unsteadiness on feet: Secondary | ICD-10-CM | POA: Diagnosis not present

## 2021-12-20 DIAGNOSIS — Z1152 Encounter for screening for COVID-19: Secondary | ICD-10-CM | POA: Diagnosis not present

## 2021-12-20 DIAGNOSIS — Z6841 Body Mass Index (BMI) 40.0 and over, adult: Secondary | ICD-10-CM | POA: Diagnosis not present

## 2021-12-20 DIAGNOSIS — J4489 Other specified chronic obstructive pulmonary disease: Secondary | ICD-10-CM

## 2021-12-20 LAB — CBG MONITORING, ED
Glucose-Capillary: 107 mg/dL — ABNORMAL HIGH (ref 70–99)
Glucose-Capillary: 99 mg/dL (ref 70–99)

## 2021-12-20 LAB — BASIC METABOLIC PANEL
Anion gap: 11 (ref 5–15)
BUN: 20 mg/dL (ref 8–23)
CO2: 21 mmol/L — ABNORMAL LOW (ref 22–32)
Calcium: 9.3 mg/dL (ref 8.9–10.3)
Chloride: 108 mmol/L (ref 98–111)
Creatinine, Ser: 1.71 mg/dL — ABNORMAL HIGH (ref 0.44–1.00)
GFR, Estimated: 32 mL/min — ABNORMAL LOW (ref >60)
Glucose, Bld: 108 mg/dL — ABNORMAL HIGH (ref 70–99)
Potassium: 3.9 mmol/L (ref 3.5–5.1)
Sodium: 140 mmol/L (ref 135–145)

## 2021-12-20 LAB — D-DIMER, QUANTITATIVE: D-Dimer, Quant: 4.58 ug/mL-FEU — ABNORMAL HIGH (ref 0.00–0.50)

## 2021-12-20 LAB — CREATININE, URINE, RANDOM: Creatinine, Urine: 59 mg/dL

## 2021-12-20 LAB — HIV ANTIBODY (ROUTINE TESTING W REFLEX): HIV Screen 4th Generation wRfx: NONREACTIVE

## 2021-12-20 LAB — URINALYSIS, ROUTINE W REFLEX MICROSCOPIC
Bilirubin Urine: NEGATIVE
Glucose, UA: >=500 mg/dL — AB
Ketones, ur: NEGATIVE mg/dL
Leukocytes,Ua: NEGATIVE
Nitrite: NEGATIVE
Protein, ur: 30 mg/dL — AB
Specific Gravity, Urine: 1.013 (ref 1.005–1.030)
pH: 7 (ref 5.0–8.0)

## 2021-12-20 LAB — GLUCOSE, CAPILLARY
Glucose-Capillary: 83 mg/dL (ref 70–99)
Glucose-Capillary: 104 mg/dL — ABNORMAL HIGH (ref 70–99)

## 2021-12-20 LAB — CBC
HCT: 37 % (ref 36.0–46.0)
Hemoglobin: 11.8 g/dL — ABNORMAL LOW (ref 12.0–15.0)
MCH: 26.6 pg (ref 26.0–34.0)
MCHC: 31.9 g/dL (ref 30.0–36.0)
MCV: 83.3 fL (ref 80.0–100.0)
Platelets: 244 10*3/uL (ref 150–400)
RBC: 4.44 MIL/uL (ref 3.87–5.11)
RDW: 15.9 % — ABNORMAL HIGH (ref 11.5–15.5)
WBC: 4.8 10*3/uL (ref 4.0–10.5)
nRBC: 0 % (ref 0.0–0.2)

## 2021-12-20 LAB — HEPARIN LEVEL (UNFRACTIONATED)
Heparin Unfractionated: 0.68 IU/mL (ref 0.30–0.70)
Heparin Unfractionated: 0.64 IU/mL (ref 0.30–0.70)

## 2021-12-20 LAB — BRAIN NATRIURETIC PEPTIDE: B Natriuretic Peptide: 8.7 pg/mL (ref 0.0–100.0)

## 2021-12-20 LAB — SODIUM, URINE, RANDOM: Sodium, Ur: 92 mmol/L

## 2021-12-20 LAB — SARS CORONAVIRUS 2 BY RT PCR: SARS Coronavirus 2 by RT PCR: NEGATIVE

## 2021-12-20 LAB — HEMOGLOBIN A1C
Hgb A1c MFr Bld: 7.7 % — ABNORMAL HIGH (ref 4.8–5.6)
Mean Plasma Glucose: 174.29 mg/dL

## 2021-12-20 LAB — TROPONIN I (HIGH SENSITIVITY): Troponin I (High Sensitivity): 4 ng/L (ref <18)

## 2021-12-20 MED ORDER — ONDANSETRON HCL 4 MG/2ML IJ SOLN: 4.0000 mg | Freq: Four times a day (QID) | INTRAMUSCULAR | Status: DC | PRN

## 2021-12-20 MED ORDER — ROSUVASTATIN CALCIUM 20 MG PO TABS
40.0000 mg | ORAL_TABLET | Freq: Every day | ORAL | Status: DC
  Administered 2021-12-20 – 2021-12-21 (×2): 40 mg via ORAL
  Filled 2021-12-20 (×2): qty 2

## 2021-12-20 MED ORDER — SODIUM CHLORIDE 0.9% FLUSH
3.0000 mL | Freq: Two times a day (BID) | INTRAVENOUS | Status: DC
  Administered 2021-12-20 – 2021-12-22 (×4): 3 mL via INTRAVENOUS

## 2021-12-20 MED ORDER — CROMOLYN SODIUM 4 % OP SOLN: 1.0000 [drp] | Freq: Four times a day (QID) | OPHTHALMIC | Status: DC | PRN

## 2021-12-20 MED ORDER — GABAPENTIN 300 MG PO CAPS
300.0000 mg | ORAL_CAPSULE | Freq: Three times a day (TID) | ORAL | Status: DC
  Administered 2021-12-20 – 2021-12-22 (×7): 300 mg via ORAL
  Filled 2021-12-20 (×7): qty 1

## 2021-12-20 MED ORDER — SODIUM CHLORIDE 0.9 % IV BOLUS
500.0000 mL | Freq: Once | INTRAVENOUS | Status: AC
  Administered 2021-12-20: 500 mL via INTRAVENOUS

## 2021-12-20 MED ORDER — EMPAGLIFLOZIN 10 MG PO TABS: 10.0000 mg | ORAL_TABLET | Freq: Every day | ORAL | Status: DC

## 2021-12-20 MED ORDER — INSULIN PUMP
Freq: Three times a day (TID) | SUBCUTANEOUS | Status: DC
  Filled 2021-12-20: qty 1

## 2021-12-20 MED ORDER — MOMETASONE FURO-FORMOTEROL FUM 100-5 MCG/ACT IN AERO
2.0000 | INHALATION_SPRAY | Freq: Two times a day (BID) | RESPIRATORY_TRACT | Status: DC
  Administered 2021-12-21 – 2021-12-22 (×3): 2 via RESPIRATORY_TRACT
  Filled 2021-12-20: qty 8.8

## 2021-12-20 MED ORDER — HEPARIN BOLUS VIA INFUSION
4000.0000 [IU] | Freq: Once | INTRAVENOUS | Status: AC
  Administered 2021-12-20: 4000 [IU] via INTRAVENOUS

## 2021-12-20 MED ORDER — ACETAMINOPHEN 500 MG PO TABS
1000.0000 mg | ORAL_TABLET | Freq: Once | ORAL | Status: AC
  Administered 2021-12-20: 1000 mg via ORAL
  Filled 2021-12-20: qty 2

## 2021-12-20 MED ORDER — ONDANSETRON HCL 4 MG PO TABS: 4.0000 mg | ORAL_TABLET | Freq: Four times a day (QID) | ORAL | Status: DC | PRN

## 2021-12-20 MED ORDER — SODIUM CHLORIDE 0.9 % IV SOLN: INTRAVENOUS | Status: DC

## 2021-12-20 MED ORDER — UMECLIDINIUM BROMIDE 62.5 MCG/ACT IN AEPB
1.0000 | INHALATION_SPRAY | Freq: Every day | RESPIRATORY_TRACT | Status: DC
  Administered 2021-12-21 – 2021-12-22 (×2): 1 via RESPIRATORY_TRACT
  Filled 2021-12-20: qty 7

## 2021-12-20 MED ORDER — ISOSORBIDE MONONITRATE ER 60 MG PO TB24
60.0000 mg | ORAL_TABLET | Freq: Every day | ORAL | Status: DC
  Administered 2021-12-21 – 2021-12-22 (×2): 60 mg via ORAL
  Filled 2021-12-20 (×2): qty 1

## 2021-12-20 MED ORDER — ACETAMINOPHEN 650 MG RE SUPP: 650.0000 mg | Freq: Four times a day (QID) | RECTAL | Status: DC | PRN

## 2021-12-20 MED ORDER — INSULIN ASPART 100 UNIT/ML IJ SOLN: 0.0000 [IU] | Freq: Three times a day (TID) | INTRAMUSCULAR | Status: DC

## 2021-12-20 MED ORDER — ACETAMINOPHEN 325 MG PO TABS
650.0000 mg | ORAL_TABLET | Freq: Four times a day (QID) | ORAL | Status: DC | PRN
  Administered 2021-12-21 (×2): 650 mg via ORAL
  Filled 2021-12-20 (×2): qty 2

## 2021-12-20 MED ORDER — HEPARIN (PORCINE) 25000 UT/250ML-% IV SOLN
1400.0000 [IU]/h | INTRAVENOUS | Status: DC
  Administered 2021-12-20 – 2021-12-21 (×3): 1400 [IU]/h via INTRAVENOUS
  Filled 2021-12-20 (×3): qty 250

## 2021-12-20 MED ORDER — LORAZEPAM 2 MG/ML IJ SOLN
0.5000 mg | Freq: Once | INTRAMUSCULAR | Status: AC | PRN
  Administered 2021-12-21: 0.5 mg via INTRAVENOUS
  Filled 2021-12-20 (×2): qty 1

## 2021-12-20 MED ORDER — ALBUTEROL SULFATE (2.5 MG/3ML) 0.083% IN NEBU
2.5000 mg | INHALATION_SOLUTION | RESPIRATORY_TRACT | Status: DC | PRN
  Administered 2021-12-21: 2.5 mg via RESPIRATORY_TRACT
  Filled 2021-12-20: qty 3

## 2021-12-20 MED ORDER — BUDESON-GLYCOPYRROL-FORMOTEROL 160-9-4.8 MCG/ACT IN AERO: 2.0000 | INHALATION_SPRAY | Freq: Two times a day (BID) | RESPIRATORY_TRACT | Status: DC

## 2021-12-20 MED ORDER — LIDOCAINE 5 % EX PTCH
3.0000 | MEDICATED_PATCH | CUTANEOUS | Status: DC
  Administered 2021-12-20 – 2021-12-22 (×3): 3 via TRANSDERMAL
  Filled 2021-12-20 (×3): qty 3

## 2021-12-20 MED ORDER — PANTOPRAZOLE SODIUM 40 MG PO TBEC
40.0000 mg | DELAYED_RELEASE_TABLET | Freq: Two times a day (BID) | ORAL | Status: DC
  Administered 2021-12-20 – 2021-12-22 (×4): 40 mg via ORAL
  Filled 2021-12-20 (×2): qty 1
  Filled 2021-12-20: qty 2
  Filled 2021-12-20 (×2): qty 1

## 2021-12-20 MED ORDER — CLOPIDOGREL BISULFATE 75 MG PO TABS
75.0000 mg | ORAL_TABLET | Freq: Every day | ORAL | Status: DC
  Administered 2021-12-20 – 2021-12-22 (×3): 75 mg via ORAL
  Filled 2021-12-20 (×3): qty 1

## 2021-12-20 MED ORDER — HEPARIN (PORCINE) 25000 UT/250ML-% IV SOLN: 12.0000 [IU]/kg/h | INTRAVENOUS | Status: DC

## 2021-12-20 NOTE — Progress Notes (Signed)
ANTICOAGULATION CONSULT NOTE - Initial Consult  Pharmacy Consult for Heparin Indication: pulmonary embolus  Allergies  Allergen Reactions   Contrast Media [Iodinated Contrast Media] Shortness Of Breath and Other (See Comments)    sob, chest tight   Morphine Itching   Statins Diarrhea and Other (See Comments)    nausea, diarrhea, myalgias   Ioversol     Patient Measurements: Height: '5\' 5"'$  (165.1 cm) Weight: 114 kg (251 lb 5.2 oz) IBW/kg (Calculated) : 57 Heparin Dosing Weight: 84kg  Vital Signs: Temp: 98.3 F (36.8 C) (10/27 1237) Temp Source: Oral (10/27 1237) BP: 112/54 (10/27 1237) Pulse Rate: 64 (10/27 1237)  Labs: Recent Labs    12/19/21 2037 12/19/21 2309 12/20/21 0723  HGB 14.0  --   --   HCT 43.8  --   --   PLT 351  --   --   LABPROT 13.5  --   --   INR 1.0  --   --   HEPARINUNFRC  --   --  0.68  CREATININE 2.21*  --   --   TROPONINIHS 3 4  --     Estimated Creatinine Clearance: 31.1 mL/min (A) (by C-G formula based on SCr of 2.21 mg/dL (H)).   Medical History: Past Medical History:  Diagnosis Date   Asthma    Congestive heart failure (CHF) ([place]) 2019   COPD (chronic obstructive pulmonary disease) ([place]) 2021   Diabetes ([place]) 2010   Eczema    Heart attack ([place]) 2015   Heart disease 2008   History of left hip replacement 04/2018   Insulin pump in place     Medications:  Scheduled:   clopidogrel  75 mg Oral Daily   [START ON 12/21/2021] empagliflozin  10 mg Oral QAC breakfast   gabapentin  300 mg Oral TID   insulin aspart  0-15 Units Subcutaneous TID WC   isosorbide mononitrate  60 mg Oral Daily   lidocaine  3 patch Transdermal Q24H   pantoprazole  40 mg Oral BID AC & HS   rosuvastatin  40 mg Oral QHS   Infusions:   sodium chloride 125 mL/hr at 12/20/21 0219   heparin 1,400 Units/hr (12/20/21 0120)    Assessment: 39 YOF presenting with SOB and chest pain. Pharmacy consulted to start heparin for presumed PE while awaiting transfer. No  anticoagulation prior to admission.  Heparin level 0.68 units/mL on heparin 1400 units/hr which is therapeutic.  Goal of Therapy:  Heparin level 0.3-0.7 units/ml Monitor platelets by anticoagulation protocol: Yes   Plan:  Continue heparin 1400 units/hr Check confirmatory heparin level at 1400 Daily heparin level and CBC (will need to be ordered on transfer) Monitor for signs/symptoms of bleeding  Merrilee Jansky, PharmD Clinical Pharmacist 12/20/2021,1:32 PM

## 2021-12-20 NOTE — Plan of Care (Signed)
[facility] will assume care on arrival to accepting facility. Until arrival, care as per EDP. However, [facility] available 24/7 for questions and assistance.  Nursing staff, please page TRH Admits and Consults ([phone]) as soon as the patient arrives to the hospital.   

## 2021-12-20 NOTE — Progress Notes (Signed)
ANTICOAGULATION CONSULT NOTE - Initial Consult  Pharmacy Consult for Heparin Indication: pulmonary embolus  Allergies  Allergen Reactions   Contrast Media [Iodinated Contrast Media] Shortness Of Breath and Other (See Comments)    sob, chest tight   Morphine Itching   Statins Diarrhea and Other (See Comments)    nausea, diarrhea, myalgias   Ioversol     Patient Measurements: Height: '5\' 5"'$  (165.1 cm) Weight: 114 kg (251 lb 5.2 oz) IBW/kg (Calculated) : 57 Heparin Dosing Weight: 84kg  Vital Signs: Temp: 98 F (36.7 C) (10/27 1506) Temp Source: Oral (10/27 1506) BP: 149/89 (10/27 1506) Pulse Rate: 81 (10/27 1506)  Labs: Recent Labs    12/19/21 2037 12/19/21 2309 12/20/21 0723 12/20/21 1400  HGB 14.0  --   --  11.8*  HCT 43.8  --   --  37.0  PLT 351  --   --  244  LABPROT 13.5  --   --   --   INR 1.0  --   --   --   HEPARINUNFRC  --   --  0.68 0.64  CREATININE 2.21*  --   --   --   TROPONINIHS 3 4  --   --      Estimated Creatinine Clearance: 31.1 mL/min (A) (by C-G formula based on SCr of 2.21 mg/dL (H)).   Medical History: Past Medical History:  Diagnosis Date   Asthma    Congestive heart failure (CHF) ([place]) 2019   COPD (chronic obstructive pulmonary disease) ([place]) 2021   Diabetes ([place]) 2010   Eczema    Heart attack ([place]) 2015   Heart disease 2008   History of left hip replacement 04/2018   Insulin pump in place     Medications:  Scheduled:   clopidogrel  75 mg Oral Daily   gabapentin  300 mg Oral TID   insulin aspart  0-15 Units Subcutaneous TID WC   isosorbide mononitrate  60 mg Oral Daily   lidocaine  3 patch Transdermal Q24H   pantoprazole  40 mg Oral BID AC & HS   rosuvastatin  40 mg Oral QHS   Infusions:   sodium chloride 125 mL/hr at 12/20/21 0219   heparin 1,400 Units/hr (12/20/21 1510)    Assessment: 8 YOF presenting with SOB and chest pain. Pharmacy consulted to start heparin for presumed PE while awaiting transfer. No  anticoagulation prior to admission.  Heparin level 0.64 units/mL on heparin 1400 units/hr which is therapeutic. No issues with heparin infusion on transport to [facility] or bleeding noted.  Goal of Therapy:  Heparin level 0.3-0.7 units/ml Monitor platelets by anticoagulation protocol: Yes   Plan:  Continue heparin 1400 units/hr Daily heparin level and CBC Monitor for signs/symptoms of bleeding F/u PE w/u   Dimple Nanas, PharmD, BCPS 12/20/2021 3:35 PM'

## 2021-12-20 NOTE — H&P (Addendum)
History and Physical    Patient: Katrina Vega PHX:[id number] DOB: 10-13-1955 DOA: 12/19/2021 DOS: the patient was seen and examined on 12/20/2021 PCP: Marrian Salvage, [place]  Patient coming from: Transfer from [place]   Chief Complaint:  Chief Complaint  Patient presents with   Shortness of Breath   HPI: Katrina Vega is a 66 y.o. female with medical history significant of hypertension, CAD, diastolic congestive heart failure, diabetes mellitus type 2 on insulin pump, asthma-COPD overlap syndrome, PAD, OSA on CPAP, and morbid obesity who presents with complaints of shortness of breath and substernal chest pain over the last week and a half.  She reports having intermittent sharp pains on the left side of her chest which are pleuritic in nature.  Denies any change in shortness of breath while sleeping or laying flat.  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  Patient admits that she sits for prolonged periods of time with her job.  She admits to having significant pains in the back of her left leg and into her groin here recently.  She had been seen in the emergency department on 10/16 due to complaints of chest pain.  At that time patient had chest x-ray which noted mild bilateral interstitial densities concerning for pulmonary edema.  She had been given Lasix 20 mg IV and discharged home.  She followed up cardiology on 10/19 and was recommended to be on Lasix 80 mg daily.  Despite this patient reported urinating infrequently.  She followed up in the office on 10/23 and had been switched to Bumex 1 mg daily with plans to restart Jardiance.  Upon admission into the emergency department patient was noted to be afebrile with pulse 59-92, respirations 12-24, blood pressures 82/68 -149/89, and O2 saturations as low as 88% with improvement without need of nasal cannula oxygen greater than 92%. Labs from 10/26 noted BUN 25, creatinine 2.21, calcium 10.7, high-sensitivity  troponins negative x2, and D-dimer 4.58.  EKG did not note any significant ischemic changes.  Due to patient's worsening kidney function transfern requested for need of VQ scan.   Review of Systems: As mentioned in the history of present illness. All other systems reviewed and are negative. 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   Insulin pump in place    Past Surgical History:  Procedure Laterality Date   APPENDECTOMY  1974   BIOPSY  02/22/2021   Procedure: BIOPSY;  Surgeon: Carol Ada, MD;  Location: [facility];  Service: Endoscopy;;   CARPAL TUNNEL RELEASE  2017   COLONOSCOPY WITH PROPOFOL N/A 02/22/2021   Procedure: COLONOSCOPY WITH PROPOFOL;  Surgeon: Carol Ada, MD;  Location: [facility];  Service: Endoscopy;  Laterality: N/A;   ESOPHAGOGASTRODUODENOSCOPY (EGD) WITH PROPOFOL N/A 02/22/2021   Procedure: ESOPHAGOGASTRODUODENOSCOPY (EGD) WITH PROPOFOL;  Surgeon: Carol Ada, MD;  Location: [facility];  Service: Endoscopy;  Laterality: N/A;   LUMBAR SPINE SURGERY  2010   POLYPECTOMY  02/22/2021   Procedure: POLYPECTOMY;  Surgeon: Carol Ada, MD;  Location: [facility];  Service: Endoscopy;;   REPLACEMENT TOTAL KNEE  2017 and 2018   TONSILLECTOMY     TOTAL ABDOMINAL HYSTERECTOMY  1989   TOTAL HIP ARTHROPLASTY  04/2018   TOTAL SHOULDER ARTHROPLASTY  11/2019   Social History:  reports that she quit smoking about 26 years ago. Her smoking use included cigarettes. She has a 7.50 pack-year smoking history. She has never used smokeless tobacco. She reports that she does not currently use alcohol. She reports that she does not use drugs.  Allergies  Allergen Reactions   Contrast Media [Iodinated Contrast Media] Shortness Of Breath and  Other (See Comments)    sob, chest tight   Morphine Itching   Statins Diarrhea and Other (See Comments)    nausea, diarrhea, myalgias   Ioversol     Family History  Problem Relation Age of Onset   Heart disease Mother    Breast cancer Mother    Heart attack Father    Lung cancer Father    Eczema Grandson    Allergic rhinitis Neg Hx    Angioedema Neg Hx    Asthma Neg Hx    Atopy Neg Hx    Immunodeficiency Neg Hx    Urticaria Neg Hx     Prior to Admission medications   Medication Sig Start Date End Date Taking? Authorizing Provider  albuterol (PROVENTIL) (2.5 MG/3ML) 0.083% nebulizer solution Take 3 mLs (2.5 mg total) by nebulization every 6 (six) hours as needed for wheezing or shortness of breath. 03/15/21   Dara Hoyer, FNP  albuterol (VENTOLIN HFA) 108 (90 Base) MCG/ACT inhaler Inhale 2 puffs into the lungs every 6 (six) hours as needed for wheezing. 04/03/21   Dara Hoyer, FNP  azelastine (ASTELIN) 0.1 % nasal spray 1-2 sprays per nostril 2 times daily as needed for runny nose. 05/09/20   Garnet Sierras, DO  BD AUTOSHIELD DUO 30G X 5 MM MISC 1 KIT BY MISC ROUTE 3 TIMES DAILY BEFORE MEALS 10/08/20   [provider]  benzonatate (TESSALON) 200 MG capsule Take 1 capsule (200 mg total) by mouth 2 (two) times daily as needed for cough. 11/05/21   Libby Maw, MD  Budeson-Glycopyrrol-Formoterol (BREZTRI AEROSPHERE) 160-9-4.8 MCG/ACT AERO Inhale two puffs twice daily to prevent cough or wheeze.  Rinse, gargle, and spit after use. Patient not taking: Reported on 12/17/2021 06/17/21   Dara Hoyer, FNP  bumetanide (BUMEX) 1 MG tablet Take 1 tablet (1 mg total) by mouth daily. 12/16/21   Ernst Spell, NP  celecoxib (CELEBREX) 200 MG capsule Take by mouth. Patient not taking: Reported on 12/17/2021 01/30/21   [provider]  cetirizine (ZYRTEC) 10 MG tablet Take 1 tablet by mouth daily.    [provider]  Cholecalciferol 50 MCG (2000 UT) TABS Take by  mouth.    [provider]  clopidogrel (PLAVIX) 75 MG tablet Take 1 tablet (75 mg total) by mouth daily. 10/17/21   Adrian Prows, MD  Continuous Blood Gluc Sensor (DEXCOM G6 SENSOR) MISC USE 1 SENSOR AS NEEDED CHANGE EVERY 10 DAYS 01/25/20   [provider]  cromolyn (OPTICROM) 4 % ophthalmic solution Use 1 drop in each eye up to 4 times a day as needed for red, itchy, or watery eyes 07/11/21   Althea Charon, FNP  diclofenac Sodium (VOLTAREN) 1 % GEL Apply 4 grams topically 4 (four) times daily. 07/25/21   Deno Etienne, DO  empagliflozin (JARDIANCE) 10 MG TABS tablet Take 1 tablet (10 mg total) by mouth daily before breakfast. 12/16/21   Ernst Spell, NP  EPINEPHrine (EPIPEN 2-PAK) 0.3 mg/0.3 mL IJ SOAJ injection Use as directed for severe allergic reactions 02/29/20  Bobbitt, [place] Muta, MD  ferrous fumarate (HEMOCYTE - 106 MG FE) 325 (106 Fe) MG TABS tablet Take by mouth. Patient not taking: Reported on 12/17/2021    [provider]  gabapentin (NEURONTIN) 300 MG capsule TAKE 1 CAPSULE BY MOUTH 3 TIMES  DAILY 07/02/21   Marrian Salvage, FNP  glucagon 1 MG injection Inject 1 mg into the muscle once as needed (low blood sugar). 07/30/19   [provider]  glucose 4 GM chewable tablet Chew 1 tablet by mouth once as needed for low blood sugar. 07/30/19   [provider]  HUMALOG 100 UNIT/ML injection Inject into the skin. 06/06/21   [provider]  HUMULIN N 100 UNIT/ML injection Inject 60 Units into the skin 3 (three) times daily before meals. Patient not taking: Reported on 12/17/2021 06/04/20   [provider]  isosorbide mononitrate (IMDUR) 60 MG 24 hr tablet Take 1 tablet (60 mg total) by mouth daily. 10/17/21   Adrian Prows, MD  lisinopril (ZESTRIL) 20 MG tablet Take 1 tablet (20 mg total) by mouth daily. 10/17/21   Adrian Prows, MD  metoprolol tartrate (LOPRESSOR) 50 MG tablet Take 1 tablet (50 mg total) by mouth every 12 (twelve)  hours. 10/17/21   Adrian Prows, MD  montelukast (SINGULAIR) 10 MG tablet TAKE 1 TABLET(10 MG) BY MOUTH AT BEDTIME 12/16/21   Althea Charon, FNP  nitroGLYCERIN (NITROSTAT) 0.4 MG SL tablet Place 1 tablet (0.4 mg total) under the tongue every 5 (five) minutes as needed for chest pain. 04/19/21   Adrian Prows, MD  OZEMPIC, 2 MG/DOSE, 8 MG/3ML SOPN Inject into the skin. 06/05/21   [provider]  pantoprazole (PROTONIX) 40 MG tablet TAKE 1 TABLET BY MOUTH IN THE  MORNING AND AT BEDTIME 10/07/21   Marrian Salvage, FNP  potassium chloride (KLOR-CON) 10 MEQ tablet Take 1 tablet (10 mEq total) by mouth daily. 12/12/21 12/07/22  Ernst Spell, NP  psyllium (METAMUCIL) 58.6 % packet Take 1 packet by mouth daily as needed (regularity).    [provider]  Respiratory Therapy Supplies (CARETOUCH 2 CPAP HOSE HANGER) MISC by Does not apply route.    [provider]  rosuvastatin (CRESTOR) 40 MG tablet Take 1 tablet (40 mg total) by mouth at bedtime. 10/17/21   Adrian Prows, MD  tiZANidine (ZANAFLEX) 4 MG tablet Take by mouth.    [provider]    Physical Exam: Vitals:   12/20/21 1100 12/20/21 1230 12/20/21 1237 12/20/21 1506  BP: (!) 114/59  (!) 112/54 (!) 149/89  Pulse: 64  64 81  Resp:   17 16  Temp: (!) 97.5 F (36.4 C)  98.3 F (36.8 C) 98 F (36.7 C)  TempSrc:   Oral Oral  SpO2: 97% 97% 92% 95%  Weight:      Height:        Constitutional: Older adult female currently in no acute distress Eyes: PERRL, lids and conjunctivae normal ENMT: Mucous membranes are moist.  Neck: normal, supple, no JVD Respiratory: clear to auscultation bilaterally, no wheezing, no crackles. Normal respiratory effort. No accessory muscle use.  Cardiovascular: Regular rate and rhythm, no murmurs / rubs / gallops.  No significant lower extremity edema appreciated at this time. Abdomen: no tenderness, no masses palpated.Bowel sounds positive.  Musculoskeletal: no clubbing /  cyanosis. No joint deformity upper and lower extremities. Good ROM, no contractures. Normal muscle tone.  Skin: no rashes, lesions, ulcers. No induration Neurologic: CN 2-12 grossly intact. Strength 5/5  in all 4.  Psychiatric: Normal judgment and insight. Alert and oriented x 3. Normal mood.   Data Reviewed:  EKG revealed normal sinus rhythm 89 bpm.  Reviewed labs, imaging, and pertinent records as noted above in HPI  Assessment and Plan: Chest pain and shortness of breath Patient presents with complaints of a week and a half history of shortness of breath with chest pain.  High-sensitivity troponins negative x2 and EKG without significant ischemic changes, but D-dimer to be elevated at 4.58.  Question possibility of DVT/pulmonary embolus as cause of patient's symptoms she had noted having left lower extremity pain and that also went into her groin.  Risk factors include patient reports sitting for long periods in time with her job.  Prior history reported of blood clots. -Admit to a cardiac telemetry bed -Bedrest -Continue heparin drip and consider transitioning to oral anticoagulation based on findings below -Check Doppler ultrasound lower extremities -Check VQ scan -May warrant further work-up depending on findings  Acute kidney injury superimposed on chronic kidney disease stage IIIa Creatinine 2.21 with BUN 25, but creatinine had been 1.14 on 10/16.  Suspect patient had recently may have been over diuresed with changes from torsemide to furosemide to Bumex as the likely cause for decreased urine output.  Patient had been started on normal saline IV fluids at 125 mL/h and had received least 1 L IV fluids prior to transport. -Check urinalysis -Check urine sodium, urea, and creatinine -Avoid nephrotoxic agents -Held additional IV fluids at this time due to risk of overloading the patient.  Diabetes  mellitus type 2 with long-term use of insulin Patient is on insulin pump.  Hemoglobin A1c  7.7 -Hold Jardiance due to kidney function -Insulin pump per protocol initiated  Chronic diastolic congestive heart failure Last echocardiogram revealed EF to be 59% and grade 1 diastolic this 89/38/1017.  Patient does not appear to be fluid overloaded at this time.  BNP was 8.7. -Strict I&O's and Daily weights   -Hold diuretics  Hypercalcemia Acute.  Calcium was mildly elevated at 10.7.  Patient had been given normal saline IV fluids.  Asthma COPD overlap syndrome -Recheck calcium levels  Asthma-COPD overlap syndrome Patient without signs of wheezing noted on physical exam at this time. -Continue home inhaler and albuterol nebs as needed  Essential hypertension Blood pressures had been as soft as 82/68 and 92/52.  Home blood pressure regimen included isosorbide mononitrate 60 mg daily, lisinopril 20 mg daily, and metoprolol 50 mg every 12 hours. -Continue isosorbide mononitrate -Resume other home medications as medically appropriate as initially held due to soft blood pressures  OSA -Continue CPAP at night  Morbid obesity BMI 41.82 kg/m   Advance Care Planning:   Code Status: Full Code   Consults: None  Family Communication: Left message on daughter Kavin Leech cell phone  Severity of Illness: The appropriate patient status for this patient is OBSERVATION. Observation status is judged to be reasonable and necessary in order to provide the required intensity of service to ensure the patient's safety. The patient's presenting symptoms, physical exam findings, and initial radiographic and laboratory data in the context of their medical condition is felt to place them at decreased risk for further clinical deterioration. Furthermore, it is anticipated that the patient will be medically stable for discharge from the hospital within 2 midnights of admission.   Author: Norval Morton, MD 12/20/2021 3:14 PM  For on call review [email or web address].

## 2021-12-20 NOTE — Progress Notes (Signed)
ANTICOAGULATION CONSULT NOTE - Initial Consult  Pharmacy Consult for heparin Indication: pulmonary embolus  Allergies  Allergen Reactions   Contrast Media [Iodinated Contrast Media] Shortness Of Breath and Other (See Comments)    sob, chest tight   Morphine Itching   Statins Diarrhea and Other (See Comments)    nausea, diarrhea, myalgias   Ioversol     Patient Measurements: Height: '5\' 5"'$  (165.1 cm) Weight: 114 kg (251 lb 5.2 oz) IBW/kg (Calculated) : 57 Heparin Dosing Weight: 85kg  Vital Signs: Temp: 98.7 F (37.1 C) (10/26 2001) Temp Source: Oral (10/26 2001) BP: 102/68 (10/27 0000) Pulse Rate: 75 (10/27 0000)  Labs: Recent Labs    12/17/21 1107 12/19/21 2037 12/19/21 2309  HGB  --  14.0  --   HCT  --  43.8  --   PLT  --  351  --   LABPROT  --  13.5  --   INR  --  1.0  --   CREATININE 1.44* 2.21*  --   TROPONINIHS  --  3 4    Estimated Creatinine Clearance: 31.1 mL/min (A) (by C-G formula based on SCr of 2.21 mg/dL (H)).   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  Goal of Therapy:  Heparin level 0.3-0.7 units/ml Monitor platelets by anticoagulation protocol: Yes   Plan:  Heparin 4000 units IV bolus x1 followed by infusion at 1400 units/hr. Monitor heparin levels and CBC.  Wynona Neat, PharmD, BCPS  12/20/2021,1:08 AM

## 2021-12-20 NOTE — Inpatient Diabetes Management (Signed)
Inpatient Diabetes Program Recommendations  AACE/ADA: New Consensus Statement on Inpatient Glycemic Control (2015)  Target Ranges:  Prepandial:   less than 140 mg/dL      Peak postprandial:   less than 180 mg/dL (1-2 hours)      Critically ill patients:  140 - 180 mg/dL   Lab Results  Component Value Date   GLUCAP 99 12/20/2021   HGBA1C 7.2 04/25/2021    Review of Glycemic Control  Latest Reference Range & Units 12/20/21 08:12 12/20/21 11:20  Glucose-Capillary 70 - 99 mg/dL 107 (H) 99    Diabetes history:  Outpatient Diabetes medications: Ozempic 2 mg weekly, Jardiance 10 mg daily, Humalog in Pump, Dexcom OmniPod 5 pump settings: Basal: 1.2 units an hour Total: 28.8 Insulin sensitivity factor: 30 mg/dL Insulin carb ratio:5 Blood glucose target: 110 mg/dL Current orders for Inpatient glycemic control:   Inpatient Diabetes Program Recommendations:    - d/c current insulin orders and place orders for insulin pump order set ACHS and 0200.  Called pt on her cellphone. Pt insulin pump is currently on and infusing. Pt sees. Dr. Waunita Schooner, Endocrinologist for DM management and last saw her on Sept 11. Pt had brought extra pump supplies with the exception of insulin. Pt reports her insulin pump insertion site and CGM insertion side are both on the right side of the abd. Last day the insulin pump pod and CGM was changed on Wednesday. Dexcom is changed every 10 days, Omnipod insulin pod is changed every 2-3 days. Pt will need insulin from our pharmacy. Discussed using our meters for glucose checks.  Thanks,  Tama Headings RN, MSN, BC-ADM Inpatient Diabetes Coordinator Team Pager [phone] (8a-5p)

## 2021-12-20 NOTE — ED Provider Notes (Signed)
[facility] EMERGENCY DEPARTMENT Provider Note   CSN: [id number] Arrival date & time: 12/19/21  1956     History  Chief Complaint  Patient presents with   Shortness of [place] is a 66 y.o. female.  The history is provided by the patient.  Shortness of Breath Severity:  Moderate Onset quality:  Gradual Duration:  1 week Timing:  Constant Progression:  Worsening Chronicity:  New Context: not URI and not weather changes   Relieved by:  Nothing Worsened by:  Nothing Ineffective treatments:  None tried Associated symptoms: chest pain   Associated symptoms: no abdominal pain, no fever, no sputum production, no vomiting and no wheezing   Risk factors: no recent surgery   Patient with COPD and heart disease presents with SOB and intermittent CP.  Was seen at [facility] and told it was pleural effusion and volume overload and started on lasix.  No urine produced in response to this medication.  Then seen by family doctor and cardiology and changed to Bumex without any urine produced.  Presents with worsening symptoms.      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 Authorizing Provider  albuterol (PROVENTIL) (2.5 MG/3ML) 0.083% nebulizer solution Take 3 mLs (2.5 mg total) by nebulization every 6 (six) hours as needed for wheezing or shortness of breath. 03/15/21   Dara Hoyer, FNP  albuterol (VENTOLIN HFA) 108 (90 Base) MCG/ACT inhaler Inhale 2 puffs into the lungs every 6 (six) hours as needed for wheezing. 04/03/21   Dara Hoyer, FNP  azelastine (ASTELIN) 0.1 % nasal spray 1-2 sprays per nostril 2 times daily as needed for runny  nose. 05/09/20   Garnet Sierras, DO  BD AUTOSHIELD DUO 30G X 5 MM MISC 1 KIT BY MISC ROUTE 3 TIMES DAILY BEFORE MEALS 10/08/20   [provider]  benzonatate (TESSALON) 200 MG capsule Take 1 capsule (200 mg total) by mouth 2 (two) times daily as needed for cough. 11/05/21   Libby Maw, MD  Budeson-Glycopyrrol-Formoterol (BREZTRI AEROSPHERE) 160-9-4.8 MCG/ACT AERO Inhale two puffs twice daily to prevent cough or wheeze.  Rinse, gargle, and spit after use. Patient not taking: Reported on 12/17/2021 06/17/21   Dara Hoyer, FNP  bumetanide (BUMEX) 1 MG tablet Take 1 tablet (1 mg total) by mouth daily. 12/16/21   Ernst Spell, NP  celecoxib (CELEBREX) 200 MG capsule Take by mouth. Patient not taking: Reported on 12/17/2021 01/30/21   [provider]  cetirizine (ZYRTEC) 10 MG tablet Take 1 tablet by mouth daily.    [provider]  Cholecalciferol 50 MCG (2000 UT) TABS Take by mouth.    [provider]  clopidogrel (PLAVIX) 75 MG tablet Take 1 tablet (75 mg total) by mouth daily. 10/17/21   Adrian Prows, MD  Continuous Blood Gluc Sensor (DEXCOM G6 SENSOR) MISC USE 1 SENSOR AS NEEDED CHANGE EVERY 10 DAYS 01/25/20   [provider]  cromolyn (OPTICROM) 4 % ophthalmic solution Use 1 drop in each eye up to 4 times a day as needed for red, itchy, or  watery eyes 07/11/21   Althea Charon, FNP  diclofenac Sodium (VOLTAREN) 1 % GEL Apply 4 grams topically 4 (four) times daily. 07/25/21   Deno Etienne, DO  empagliflozin (JARDIANCE) 10 MG TABS tablet Take 1 tablet (10 mg total) by mouth daily before breakfast. 12/16/21   Ernst Spell, NP  EPINEPHrine (EPIPEN 2-PAK) 0.3 mg/0.3 mL IJ SOAJ injection Use as directed for severe allergic reactions 02/29/20   Bobbitt, [place] Muta, MD  ferrous fumarate (HEMOCYTE - 106 MG FE) 325 (106 Fe) MG TABS tablet Take by mouth. Patient not taking: Reported on 12/17/2021    [provider]  gabapentin (NEURONTIN) 300  MG capsule TAKE 1 CAPSULE BY MOUTH 3 TIMES  DAILY 07/02/21   Marrian Salvage, FNP  glucagon 1 MG injection Inject 1 mg into the muscle once as needed (low blood sugar). 07/30/19   [provider]  glucose 4 GM chewable tablet Chew 1 tablet by mouth once as needed for low blood sugar. 07/30/19   [provider]  HUMALOG 100 UNIT/ML injection Inject into the skin. 06/06/21   [provider]  HUMULIN N 100 UNIT/ML injection Inject 60 Units into the skin 3 (three) times daily before meals. Patient not taking: Reported on 12/17/2021 06/04/20   [provider]  isosorbide mononitrate (IMDUR) 60 MG 24 hr tablet Take 1 tablet (60 mg total) by mouth daily. 10/17/21   Adrian Prows, MD  lisinopril (ZESTRIL) 20 MG tablet Take 1 tablet (20 mg total) by mouth daily. 10/17/21   Adrian Prows, MD  metoprolol tartrate (LOPRESSOR) 50 MG tablet Take 1 tablet (50 mg total) by mouth every 12 (twelve) hours. 10/17/21   Adrian Prows, MD  montelukast (SINGULAIR) 10 MG tablet TAKE 1 TABLET(10 MG) BY MOUTH AT BEDTIME 12/16/21   Althea Charon, FNP  nitroGLYCERIN (NITROSTAT) 0.4 MG SL tablet Place 1 tablet (0.4 mg total) under the tongue every 5 (five) minutes as needed for chest pain. 04/19/21   Adrian Prows, MD  OZEMPIC, 2 MG/DOSE, 8 MG/3ML SOPN Inject into the skin. 06/05/21   [provider]  pantoprazole (PROTONIX) 40 MG tablet TAKE 1 TABLET BY MOUTH IN THE  MORNING AND AT BEDTIME 10/07/21   Marrian Salvage, FNP  potassium chloride (KLOR-CON) 10 MEQ tablet Take 1 tablet (10 mEq total) by mouth daily. 12/12/21 12/07/22  Ernst Spell, NP  psyllium (METAMUCIL) 58.6 % packet Take 1 packet by mouth daily as needed (regularity).    [provider]  Respiratory Therapy Supplies (CARETOUCH 2 CPAP HOSE HANGER) MISC by Does not apply route.    [provider]  rosuvastatin (CRESTOR) 40 MG tablet Take 1 tablet (40 mg total) by mouth at bedtime. 10/17/21   Adrian Prows, MD   tiZANidine (ZANAFLEX) 4 MG tablet Take by mouth.    [provider]      Allergies    Contrast media [iodinated contrast media], Morphine, Statins, and Ioversol    Review of Systems   Review of Systems  Constitutional:  Negative for fever.  HENT:  Negative for facial swelling.   Eyes:  Negative for redness.  Respiratory:  Positive for shortness of breath. Negative for sputum production and wheezing.   Cardiovascular:  Positive for chest pain.  Gastrointestinal:  Negative for abdominal pain and vomiting.  All other systems reviewed and are negative.   Physical Exam Updated Vital Signs BP (!) 97/58   Pulse 69   Temp 98.7 F (37.1 C) (Oral)   Resp 16  Ht _0  (1.651 m)   Wt 114 kg   SpO2 93%   BMI 41.82 kg/m  Physical Exam Vitals and nursing note reviewed.  Constitutional:      General: She is not in acute distress.    Appearance: She is well-developed.  HENT:     Head: Normocephalic and atraumatic.     Nose: Nose normal.  Eyes:     Pupils: Pupils are equal, round, and reactive to light.  Cardiovascular:     Rate and Rhythm: Normal rate and regular rhythm.     Pulses: Normal pulses.     Heart sounds: Normal heart sounds.  Pulmonary:     Effort: Pulmonary effort is normal. No respiratory distress.     Breath sounds: Normal breath sounds.  Abdominal:     General: Bowel sounds are normal. There is no distension.     Palpations: Abdomen is soft.     Tenderness: There is no abdominal tenderness. There is no guarding or rebound.  Genitourinary:    Vagina: No vaginal discharge.  Musculoskeletal:        General: Normal range of motion.     Cervical back: Neck supple.  Skin:    General: Skin is warm and dry.     Capillary Refill: Capillary refill takes less than 2 seconds.     Findings: No erythema or rash.  Neurological:     General: No focal deficit present.     Mental Status: She is oriented to person, place, and time.     Deep Tendon Reflexes:  Reflexes normal.  Psychiatric:        Mood and Affect: Mood normal.     ED Results / Procedures / Treatments   Labs (all labs ordered are listed, but only abnormal results are displayed) Results for orders placed or performed during the hospital encounter of 12/19/21  SARS Coronavirus 2 by RT PCR (hospital order, performed in [facility] hospital lab) *cepheid single result test* Anterior Nasal Swab   Specimen: Anterior Nasal Swab  Result Value Ref Range   SARS Coronavirus 2 by RT PCR NEGATIVE NEGATIVE  Basic metabolic panel  Result Value Ref Range   Sodium 137 135 - 145 mmol/L   Potassium 3.9 3.5 - 5.1 mmol/L   Chloride 102 98 - 111 mmol/L   CO2 24 22 - 32 mmol/L   Glucose, Bld 130 (H) 70 - 99 mg/dL   BUN 25 (H) 8 - 23 mg/dL   Creatinine, Ser 2.21 (H) 0.44 - 1.00 mg/dL   Calcium 10.7 (H) 8.9 - 10.3 mg/dL   GFR, Estimated 24 (L) >60 mL/min   Anion gap 11 5 - 15  CBC  Result Value Ref Range   WBC 5.5 4.0 - 10.5 K/uL   RBC 5.32 (H) 3.87 - 5.11 MIL/uL   Hemoglobin 14.0 12.0 - 15.0 g/dL   HCT 43.8 36.0 - 46.0 %   MCV 82.3 80.0 - 100.0 fL   MCH 26.3 26.0 - 34.0 pg   MCHC 32.0 30.0 - 36.0 g/dL   RDW 15.8 (H) 11.5 - 15.5 %   Platelets 351 150 - 400 K/uL   nRBC 0.0 0.0 - 0.2 %  Protime-INR (order if Patient is taking Coumadin / Warfarin)  Result Value Ref Range   Prothrombin Time 13.5 11.4 - 15.2 seconds   INR 1.0 0.8 - 1.2  Brain natriuretic peptide  Result Value Ref Range   B Natriuretic Peptide 8.7 0.0 - 100.0 pg/mL  D-dimer,  quantitative  Result Value Ref Range   D-Dimer, Quant 4.58 (H) 0.00 - 0.50 ug/mL-FEU  Troponin I (High Sensitivity)  Result Value Ref Range   Troponin I (High Sensitivity) 3 <18 ng/L  Troponin I (High Sensitivity)  Result Value Ref Range   Troponin I (High Sensitivity) 4 <18 ng/L   DG Chest 2 View  Result Date: 12/19/2021 CLINICAL DATA:  Chest pain and shortness of breath EXAM: CHEST - 2 VIEW COMPARISON:  12/17/2021 FINDINGS: Cardiac shadow  is stable. Aortic calcifications are noted. Lungs are well aerated bilaterally. No focal infiltrate or effusion is seen. Degenerative changes of the thoracic spine are noted. IMPRESSION: No active cardiopulmonary disease. Electronically Signed   By: Inez Catalina M.D.   On: 12/19/2021 20:29   DG Chest 2 View  Result Date: 12/17/2021 CLINICAL DATA:  Pulmonary edema. Shortness of breath and chest pain. EXAM: CHEST - 2 VIEW COMPARISON:  Chest two views 12/09/2021 FINDINGS: The cardiac silhouette is again mildly enlarged. Mediastinal contours are within normal limits with moderate calcification again seen within aortic arch. Minimal bilateral lower lung interstitial thickening is similar to prior. No pleural effusion pneumothorax. Moderate multilevel degenerative disc changes of the thoracic spine. Cholecystectomy clips. Reverse total right shoulder arthroplasty. IMPRESSION: Minimal bilateral lower lung interstitial thickening is similar to prior and may represent mild interstitial edema. Electronically Signed   By: Yvonne Kendall M.D.   On: 12/17/2021 11:54   PCV ECHOCARDIOGRAM COMPLETE  Result Date: 12/16/2021 Echocardiogram 12/16/2021: Left ventricle cavity is normal in size. Mild concentric hypertrophy of the left ventricle. Normal global wall motion. Normal LV systolic function with EF 59%. Doppler evidence of grade I (impaired) diastolic dysfunction, normal LAP. Left atrial cavity is mildly dilated. No significant valvular abnormality, IVC not well visualized.   DG Chest 2 View  Result Date: 12/09/2021 CLINICAL DATA:  Chest pain. EXAM: CHEST - 2 VIEW COMPARISON:  None Available. FINDINGS: Mild cardiomegaly is noted. Mild interstitial densities are noted in the perihilar and basilar regions concerning for pulmonary edema. Bony thorax is unremarkable. IMPRESSION: Mild bilateral interstitial densities are noted concerning for pulmonary edema. Electronically Signed   By: Marijo Conception M.D.   On:  12/09/2021 16:45     EKG EKG Interpretation  Date/Time:  Thursday December 19 2021 20:07:23 EDT Ventricular Rate:  89 PR Interval:  178 QRS Duration: 82 QT Interval:  336 QTC Calculation: 408 R Axis:   15 Text Interpretation: Normal sinus rhythm Low voltage QRS Nonspecific T wave abnormality Confirmed by Randal Buba, Gavriela Cashin ([id number]) on 12/19/2021 11:00:44 PM  Radiology DG Chest 2 View  Result Date: 12/19/2021 CLINICAL DATA:  Chest pain and shortness of breath EXAM: CHEST - 2 VIEW COMPARISON:  12/17/2021 FINDINGS: Cardiac shadow is stable. Aortic calcifications are noted. Lungs are well aerated bilaterally. No focal infiltrate or effusion is seen. Degenerative changes of the thoracic spine are noted. IMPRESSION: No active cardiopulmonary disease. Electronically Signed   By: Inez Catalina M.D.   On: 12/19/2021 20:29    Procedures Procedures    Medications Ordered in ED Medications  heparin ADULT infusion 100 units/mL (25000 units/231m) (1,400 Units/hr Intravenous New Bag/Given 12/20/21 0120)  0.9 %  sodium chloride infusion ( Intravenous New Bag/Given 12/20/21 0219)  lidocaine (LIDODERM) 5 % 3 patch (3 patches Transdermal Patch Applied 12/20/21 0250)  heparin bolus via infusion 4,000 Units (4,000 Units Intravenous Bolus from Bag 12/20/21 0121)  acetaminophen (TYLENOL) tablet 1,000 mg (1,000 mg Oral Given 12/20/21 0250)  ED Course/ Medical Decision Making/ A&P                           Medical Decision Making Patient with COPD and seen for volume overload a week ago presents with ongoing sobn   Problems Addressed: AKI (acute kidney injury) [facility]):    Details: Due to diuresis  Dyspnea, unspecified type:    Details: Likely PE based on Dimer,  due to AKI and contrast allergy cannot get CTA.  Heparin initiated and VQ can be obtained upon admission   Amount and/or Complexity of Data Reviewed External Data Reviewed: radiology and notes.    Details: Previous notes and xray reviewed   Labs: ordered.    Details: All labs reviewed:  markedly elevated ddimer 4.58.  negative covid swab.  Normal sodium 137, normal potassium 3.9, elevated BUN 25 and creatinine 2.21.  normal white count 5.5, normal hemoglobin 14, normal platelet count  Radiology: ordered.  Risk OTC drugs. Prescription drug management. Decision regarding hospitalization.    Final Clinical Impression(s) / ED Diagnoses Final diagnoses:  Dyspnea, unspecified type  AKI (acute kidney injury) ([place])   Return for intractable cough, coughing up blood, fevers > 100.4 unrelieved by medication, shortness of breath, intractable vomiting, chest pain, shortness of breath, weakness, numbness, changes in speech, facial asymmetry, abdominal pain, passing out, Inability to tolerate liquids or food, cough, altered mental status or any concerns. No signs of systemic illness or infection. The patient is nontoxic-appearing on exam and vital signs are within normal limits.  I have reviewed the triage vital signs and the nursing notes. Pertinent labs & imaging results that were available during my care of the patient were reviewed by me and considered in my medical decision making (see chart for details). After history, exam, and medical workup I feel the patient has been appropriately medically screened and is safe for discharge home. Pertinent diagnoses were discussed with the patient. Patient was given return precautions Rx / DC Orders ED Discharge Orders     None         Makena Mcgrady, MD 12/20/21 [phone]

## 2021-12-20 NOTE — ED Notes (Signed)
ED TO INPATIENT HANDOFF REPORT  ED Nurse Name and Phone #:   Angelina Pih RN S Name/Age/Gender [name] 66 y.o. female Room/Bed: MH02/MH02  Code Status   Code Status: Not on file  Home/SNF/Other Home Patient oriented to: self, place, time, and situation Is this baseline? Yes   Triage Complete: Triage complete  Chief Complaint SOB (shortness of breath) [R06.02]  Triage Note Pt reports SOB starting 1 week ago, was seen at [facility] ED and was put on lasix prescription for pleural effusion. Echo done on Monday, was told she had diastolic dysfunction 1. Progressively worsening, feels worse when lying done. CP intermittent x1 week in center of chest, pressure-like. Intermittent sharp feeling. 5/10 currently. Denies N/V. Weakness/fatigue reported. Hx of 2 cardiac stents, HF, COPD, asthma. Not on home O2.    Allergies Allergies  Allergen Reactions   Contrast Media [Iodinated Contrast Media] Shortness Of Breath and Other (See Comments)    sob, chest tight   Morphine Itching   Statins Diarrhea and Other (See Comments)    nausea, diarrhea, myalgias   Ioversol     Level of Care/Admitting Diagnosis ED Disposition     ED Disposition  Admit   Condition  --   Comment  Hospital Area: [place] [[id number]]  Level of Care: Telemetry Cardiac [[id number]]  Interfacility transfer: Yes  May place patient in observation at [facility] or [place] if equivalent level of care is available:: Yes  Covid Evaluation: Asymptomatic - no recent exposure (last 10 days) testing not required  Diagnosis: SOB (shortness of breath) [789381]  Admitting Physician: Shela Leff [[id number]]  Attending Physician: Veatrice Kells [[id number]]          B Medical/Surgery History Past Medical History:  Diagnosis Date   Asthma    Congestive heart failure (CHF) ([place]) 2019   COPD (chronic obstructive pulmonary disease) ([place]) 2021   Diabetes ([place]) 2010   Eczema    Heart attack ([place])  2015   Heart disease 2008   History of left hip replacement 04/2018   Insulin pump in place    Past Surgical History:  Procedure Laterality Date   APPENDECTOMY  1974   BIOPSY  02/22/2021   Procedure: BIOPSY;  Surgeon: Carol Ada, MD;  Location: [facility];  Service: Endoscopy;;   CARPAL TUNNEL RELEASE  2017   COLONOSCOPY WITH PROPOFOL N/A 02/22/2021   Procedure: COLONOSCOPY WITH PROPOFOL;  Surgeon: Carol Ada, MD;  Location: [facility];  Service: Endoscopy;  Laterality: N/A;   ESOPHAGOGASTRODUODENOSCOPY (EGD) WITH PROPOFOL N/A 02/22/2021   Procedure: ESOPHAGOGASTRODUODENOSCOPY (EGD) WITH PROPOFOL;  Surgeon: Carol Ada, MD;  Location: [facility];  Service: Endoscopy;  Laterality: N/A;   LUMBAR SPINE SURGERY  2010   POLYPECTOMY  02/22/2021   Procedure: POLYPECTOMY;  Surgeon: Carol Ada, MD;  Location: [facility];  Service: Endoscopy;;   REPLACEMENT TOTAL KNEE  2017 and 2018   TONSILLECTOMY     TOTAL ABDOMINAL HYSTERECTOMY  1989   TOTAL HIP ARTHROPLASTY  04/2018   TOTAL SHOULDER ARTHROPLASTY  11/2019     A IV Location/Drains/Wounds Patient Lines/Drains/Airways Status     Active Line/Drains/Airways     Name Placement date Placement time Site Days   Peripheral IV 12/20/21 20 G Right Forearm 12/20/21  0005  Forearm  less than 1   Peripheral IV 12/20/21 20 G Left Forearm 12/20/21  0111  Forearm  less than 1            Intake/Output Last  24 hours No intake or output data in the 24 hours ending 12/20/21 1253  Labs/Imaging Results for orders placed or performed during the hospital encounter of 12/19/21 (from the past 48 hour(s))  Basic metabolic panel     Status: Abnormal   Collection Time: 12/19/21  8:37 PM  Result Value Ref Range   Sodium 137 135 - 145 mmol/L   Potassium 3.9 3.5 - 5.1 mmol/L   Chloride 102 98 - 111 mmol/L   CO2 24 22 - 32 mmol/L   Glucose, Bld 130 (H) 70 - 99 mg/dL    Comment: Glucose reference range applies only to samples taken  after fasting for at least 8 hours.   BUN 25 (H) 8 - 23 mg/dL   Creatinine, Ser 2.21 (H) 0.44 - 1.00 mg/dL   Calcium 10.7 (H) 8.9 - 10.3 mg/dL   GFR, Estimated 24 (L) >60 mL/min    Comment: (NOTE) Calculated using the CKD-EPI Creatinine Equation (2021)    Anion gap 11 5 - 15    Comment: Performed at [facility], [address] [place], [place] [address]  CBC     Status: Abnormal   Collection Time: 12/19/21  8:37 PM  Result Value Ref Range   WBC 5.5 4.0 - 10.5 K/uL   RBC 5.32 (H) 3.87 - 5.11 MIL/uL   Hemoglobin 14.0 12.0 - 15.0 g/dL   HCT 43.8 36.0 - 46.0 %   MCV 82.3 80.0 - 100.0 fL   MCH 26.3 26.0 - 34.0 pg   MCHC 32.0 30.0 - 36.0 g/dL   RDW 15.8 (H) 11.5 - 15.5 %   Platelets 351 150 - 400 K/uL   nRBC 0.0 0.0 - 0.2 %    Comment: Performed at [facility], [address] [place], [place] [address]  Troponin I (High Sensitivity)     Status: None   Collection Time: 12/19/21  8:37 PM  Result Value Ref Range   Troponin I (High Sensitivity) 3 <18 ng/L    Comment: (NOTE) Elevated high sensitivity troponin I (hsTnI) values and significant  changes across serial measurements may suggest ACS but many other  chronic and acute conditions are known to elevate hsTnI results.  Refer to the "Links" section for chest pain algorithms and additional  guidance. Performed at [facility], [address], [place], [place] [address]   Protime-INR (order if Patient is taking Coumadin / Warfarin)     Status: None   Collection Time: 12/19/21  8:37 PM  Result Value Ref Range   Prothrombin Time 13.5 11.4 - 15.2 seconds   INR 1.0 0.8 - 1.2    Comment: (NOTE) INR goal varies based on device and disease states. Performed at [facility], [address] [place], [place] [address]   Troponin I (High Sensitivity)     Status: None   Collection Time: 12/19/21 11:09 PM  Result Value Ref Range   Troponin I (High Sensitivity) 4 <18 ng/L     Comment: (NOTE) Elevated high sensitivity troponin I (hsTnI) values and significant  changes across serial measurements may suggest ACS but many other  chronic and acute conditions are known to elevate hsTnI results.  Refer to the "Links" section for chest pain algorithms and additional  guidance. Performed at [facility], [address], [place], [place] [address]   Brain natriuretic peptide     Status: None   Collection Time: 12/20/21 12:02 AM  Result Value Ref Range  B Natriuretic Peptide 8.7 0.0 - 100.0 pg/mL    Comment: Performed at [facility], [address] [place], [place] [address]  D-dimer, quantitative     Status: Abnormal   Collection Time: 12/20/21 12:09 AM  Result Value Ref Range   D-Dimer, Quant 4.58 (H) 0.00 - 0.50 ug/mL-FEU    Comment: (NOTE) At the manufacturer cut-off value of 0.5 g/mL FEU, this assay has a negative predictive value of 95-100%.This assay is intended for use in conjunction with a clinical pretest probability (PTP) assessment model to exclude pulmonary embolism (PE) and deep venous thrombosis (DVT) in outpatients suspected of PE or DVT. Results should be correlated with clinical presentation. Performed at [facility], [address] [place], [place] [address]   SARS Coronavirus 2 by RT PCR (hospital order, performed in [facility] hospital lab) *cepheid single result test* Anterior Nasal Swab     Status: None   Collection Time: 12/20/21 12:09 AM   Specimen: Anterior Nasal Swab  Result Value Ref Range   SARS Coronavirus 2 by RT PCR NEGATIVE NEGATIVE    Comment: (NOTE) SARS-CoV-2 target nucleic acids are NOT DETECTED.  The SARS-CoV-2 RNA is generally detectable in upper and lower respiratory specimens during the acute phase of infection. The lowest concentration of SARS-CoV-2 viral copies this assay can detect is 250 copies / mL. A negative result does not preclude SARS-CoV-2  infection and should not be used as the sole basis for treatment or other patient management decisions.  A negative result may occur with improper specimen collection / handling, submission of specimen other than nasopharyngeal swab, presence of viral mutation(s) within the areas targeted by this assay, and inadequate number of viral copies (<250 copies / mL). A negative result must be combined with clinical observations, patient history, and epidemiological information.  Fact Sheet for Patients:   https://www.patel.info/  Fact Sheet for Healthcare Providers: https://hall.com/  This test is not yet approved or  cleared by the [place] FDA and has been authorized for detection and/or diagnosis of SARS-CoV-2 by FDA under an Emergency Use Authorization (EUA).  This EUA will remain in effect (meaning this test can be used) for the duration of the COVID-19 declaration under Section 564(b)(1) of the Act, 21 U.S.C. section 360bbb-3(b)(1), unless the authorization is terminated or revoked sooner.  Performed at [facility], [address] [place], [place] [address]   Heparin level (unfractionated)     Status: None   Collection Time: 12/20/21  7:23 AM  Result Value Ref Range   Heparin Unfractionated 0.68 0.30 - 0.70 IU/mL    Comment: (NOTE) The clinical reportable range upper limit is being lowered to >1.10 to align with the FDA approved guidance for the current laboratory assay.  If heparin results are below expected values, and patient dosage has  been confirmed, suggest follow up testing of antithrombin III levels. Performed at [facility], [place] [address] [place],  [address]   CBG monitoring, ED     Status: Abnormal   Collection Time: 12/20/21  8:12 AM  Result Value Ref Range   Glucose-Capillary 107 (H) 70 - 99 mg/dL    Comment: Glucose reference range applies only to samples taken after fasting for at  least 8 hours.  CBG monitoring, ED     Status: None   Collection Time: 12/20/21 11:20 AM  Result Value Ref Range   Glucose-Capillary 99 70 - 99 mg/dL    Comment: Glucose  reference range applies only to samples taken after fasting for at least 8 hours.   DG Chest 2 View  Result Date: 12/19/2021 CLINICAL DATA:  Chest pain and shortness of breath EXAM: CHEST - 2 VIEW COMPARISON:  12/17/2021 FINDINGS: Cardiac shadow is stable. Aortic calcifications are noted. Lungs are well aerated bilaterally. No focal infiltrate or effusion is seen. Degenerative changes of the thoracic spine are noted. IMPRESSION: No active cardiopulmonary disease. Electronically Signed   By: Inez Catalina M.D.   On: 12/19/2021 20:29    Pending Labs Unresulted Labs (From admission, onward)     Start     Ordered   12/20/21 1033  Hemoglobin A1c  Once,   URGENT       Comments: To assess prior glycemic control    12/20/21 1033            Vitals/Pain Today's Vitals   12/20/21 1000 12/20/21 1100 12/20/21 1230 12/20/21 1237  BP: (!) 95/47 (!) 114/59  (!) 112/54  Pulse: 67 64  64  Resp: 12   17  Temp:  (!) 97.5 F (36.4 C)  98.3 F (36.8 C)  TempSrc:    Oral  SpO2: 98% 97% 97% 92%  Weight:      Height:      PainSc:        Isolation Precautions No active isolations  Medications Medications  heparin ADULT infusion 100 units/mL (25000 units/215m) (1,400 Units/hr Intravenous New Bag/Given 12/20/21 0120)  0.9 %  sodium chloride infusion ( Intravenous New Bag/Given 12/20/21 0219)  lidocaine (LIDODERM) 5 % 3 patch (3 patches Transdermal Patch Applied 12/20/21 0250)  clopidogrel (PLAVIX) tablet 75 mg (75 mg Oral Given 12/20/21 1049)  empagliflozin (JARDIANCE) tablet 10 mg (has no administration in time range)  gabapentin (NEURONTIN) capsule 300 mg (300 mg Oral Given 12/20/21 1049)  isosorbide mononitrate (IMDUR) 24 hr tablet 60 mg (60 mg Oral Not Given 12/20/21 1252)  pantoprazole (PROTONIX) EC tablet 40 mg (40  mg Oral Not Given 12/20/21 1252)  rosuvastatin (CRESTOR) tablet 40 mg (has no administration in time range)  insulin aspart (novoLOG) injection 0-15 Units (has no administration in time range)  heparin bolus via infusion 4,000 Units (4,000 Units Intravenous Bolus from Bag 12/20/21 0121)  acetaminophen (TYLENOL) tablet 1,000 mg (1,000 mg Oral Given 12/20/21 0250)  sodium chloride 0.9 % bolus 500 mL (0 mLs Intravenous Stopped 12/20/21 0529)    Mobility walks Low fall risk   Focused Assessments Pulmonary Assessment Handoff:  Lung sounds: Bilateral Breath Sounds: Clear O2 Device: Room Air      R Recommendations: See Admitting Provider Note  Report given to:  Pt has omnipod and dexscom to right lower abdomen   Additional Notes:

## 2021-12-20 NOTE — ED Notes (Signed)
Floor Nurse unable to take report at this time. Will call back 10 -15 mins

## 2021-12-20 NOTE — ED Notes (Signed)
[facility] on site to transfer pt.

## 2021-12-20 NOTE — ED Notes (Signed)
Noted to have insulin pump to right upper abdomen

## 2021-12-20 NOTE — Telephone Encounter (Signed)
I will call her today

## 2021-12-20 NOTE — ED Notes (Signed)
Report given to floor nurse

## 2021-12-21 ENCOUNTER — Observation Stay (HOSPITAL_COMMUNITY): Payer: Medicare Other

## 2021-12-21 ENCOUNTER — Observation Stay (HOSPITAL_BASED_OUTPATIENT_CLINIC_OR_DEPARTMENT_OTHER): Payer: Medicare Other

## 2021-12-21 DIAGNOSIS — R7989 Other specified abnormal findings of blood chemistry: Secondary | ICD-10-CM | POA: Diagnosis not present

## 2021-12-21 DIAGNOSIS — N179 Acute kidney failure, unspecified: Secondary | ICD-10-CM | POA: Diagnosis not present

## 2021-12-21 DIAGNOSIS — R918 Other nonspecific abnormal finding of lung field: Secondary | ICD-10-CM | POA: Diagnosis not present

## 2021-12-21 DIAGNOSIS — R609 Edema, unspecified: Secondary | ICD-10-CM | POA: Diagnosis not present

## 2021-12-21 DIAGNOSIS — R0602 Shortness of breath: Secondary | ICD-10-CM | POA: Diagnosis not present

## 2021-12-21 DIAGNOSIS — J4489 Other specified chronic obstructive pulmonary disease: Secondary | ICD-10-CM | POA: Diagnosis not present

## 2021-12-21 DIAGNOSIS — N189 Chronic kidney disease, unspecified: Secondary | ICD-10-CM | POA: Diagnosis not present

## 2021-12-21 DIAGNOSIS — J9811 Atelectasis: Secondary | ICD-10-CM | POA: Diagnosis not present

## 2021-12-21 DIAGNOSIS — R079 Chest pain, unspecified: Secondary | ICD-10-CM | POA: Diagnosis not present

## 2021-12-21 DIAGNOSIS — I700 Atherosclerosis of aorta: Secondary | ICD-10-CM | POA: Diagnosis not present

## 2021-12-21 LAB — GLUCOSE, CAPILLARY
Glucose-Capillary: 108 mg/dL — ABNORMAL HIGH (ref 70–99)
Glucose-Capillary: 113 mg/dL — ABNORMAL HIGH (ref 70–99)
Glucose-Capillary: 114 mg/dL — ABNORMAL HIGH (ref 70–99)
Glucose-Capillary: 156 mg/dL — ABNORMAL HIGH (ref 70–99)
Glucose-Capillary: 158 mg/dL — ABNORMAL HIGH (ref 70–99)

## 2021-12-21 LAB — CBC
HCT: 37.1 % (ref 36.0–46.0)
Hemoglobin: 11.7 g/dL — ABNORMAL LOW (ref 12.0–15.0)
MCH: 26.2 pg (ref 26.0–34.0)
MCHC: 31.5 g/dL (ref 30.0–36.0)
MCV: 83.2 fL (ref 80.0–100.0)
Platelets: 243 10*3/uL (ref 150–400)
RBC: 4.46 MIL/uL (ref 3.87–5.11)
RDW: 15.7 % — ABNORMAL HIGH (ref 11.5–15.5)
WBC: 5 10*3/uL (ref 4.0–10.5)
nRBC: 0 % (ref 0.0–0.2)

## 2021-12-21 LAB — HEMOGLOBIN A1C
Hgb A1c MFr Bld: 7.6 % — ABNORMAL HIGH (ref 4.8–5.6)
Mean Plasma Glucose: 171.42 mg/dL

## 2021-12-21 LAB — BASIC METABOLIC PANEL
Anion gap: 6 (ref 5–15)
BUN: 18 mg/dL (ref 8–23)
CO2: 22 mmol/L (ref 22–32)
Calcium: 9.1 mg/dL (ref 8.9–10.3)
Chloride: 112 mmol/L — ABNORMAL HIGH (ref 98–111)
Creatinine, Ser: 1.55 mg/dL — ABNORMAL HIGH (ref 0.44–1.00)
GFR, Estimated: 36 mL/min — ABNORMAL LOW (ref >60)
Glucose, Bld: 150 mg/dL — ABNORMAL HIGH (ref 70–99)
Potassium: 3.6 mmol/L (ref 3.5–5.1)
Sodium: 140 mmol/L (ref 135–145)

## 2021-12-21 LAB — HEPARIN LEVEL (UNFRACTIONATED): Heparin Unfractionated: 0.49 IU/mL (ref 0.30–0.70)

## 2021-12-21 MED ORDER — TECHNETIUM TO 99M ALBUMIN AGGREGATED
4.1000 | Freq: Once | INTRAVENOUS | Status: AC | PRN
  Administered 2021-12-21: 4.1 via INTRAVENOUS

## 2021-12-21 MED ORDER — KETOROLAC TROMETHAMINE 15 MG/ML IJ SOLN
15.0000 mg | Freq: Once | INTRAMUSCULAR | Status: AC
  Administered 2021-12-21: 15 mg via INTRAVENOUS
  Filled 2021-12-21: qty 1

## 2021-12-21 MED ORDER — ENOXAPARIN SODIUM 40 MG/0.4ML IJ SOSY
40.0000 mg | PREFILLED_SYRINGE | Freq: Every day | INTRAMUSCULAR | Status: DC
  Administered 2021-12-21: 40 mg via SUBCUTANEOUS
  Filled 2021-12-21: qty 0.4

## 2021-12-21 MED ORDER — METHOCARBAMOL 500 MG PO TABS
500.0000 mg | ORAL_TABLET | Freq: Three times a day (TID) | ORAL | Status: AC
  Administered 2021-12-21 – 2021-12-22 (×3): 500 mg via ORAL
  Filled 2021-12-21 (×3): qty 1

## 2021-12-21 MED ORDER — METOPROLOL TARTRATE 50 MG PO TABS
50.0000 mg | ORAL_TABLET | Freq: Two times a day (BID) | ORAL | Status: DC
  Administered 2021-12-21 – 2021-12-22 (×2): 50 mg via ORAL
  Filled 2021-12-21 (×2): qty 1

## 2021-12-21 MED ORDER — ALUM & MAG HYDROXIDE-SIMETH 200-200-20 MG/5ML PO SUSP
30.0000 mL | Freq: Once | ORAL | Status: AC
  Administered 2021-12-21: 30 mL via ORAL
  Filled 2021-12-21: qty 30

## 2021-12-21 MED ORDER — GUAIFENESIN-DM 100-10 MG/5ML PO SYRP
5.0000 mL | ORAL_SOLUTION | ORAL | Status: DC | PRN
  Administered 2021-12-21: 5 mL via ORAL
  Filled 2021-12-21: qty 5

## 2021-12-21 NOTE — Progress Notes (Signed)
Pt refused CPAP at this time. Will bring in own home mask. Pt aware unit is available overniught.

## 2021-12-21 NOTE — Plan of Care (Signed)
  Problem: Clinical Measurements: Goal: Respiratory complications will improve Outcome: Progressing Goal: Cardiovascular complication will be avoided Outcome: Progressing   Problem: Nutrition: Goal: Adequate nutrition will be maintained Outcome: Progressing   Problem: Elimination: Goal: Will not experience complications related to bowel motility Outcome: Progressing Goal: Will not experience complications related to urinary retention Outcome: Progressing

## 2021-12-21 NOTE — Care Management Obs Status (Signed)
[place] NOTIFICATION   Patient Details  Name: TAYLEY MUDRICK MRN: [id number] Date of Birth: 20-May-1955   Medicare Observation Status Notification Given:  Yes    Bethena Roys, RN 12/21/2021, 4:14 PM

## 2021-12-21 NOTE — Progress Notes (Signed)
ANTICOAGULATION CONSULT NOTE  Pharmacy Consult for Heparin Indication: pulmonary embolus  Allergies  Allergen Reactions   Contrast Media [Iodinated Contrast Media] Shortness Of Breath and Other (See Comments)    sob, chest tight   Morphine Itching   Statins Diarrhea and Other (See Comments)    nausea, diarrhea, myalgias   Ioversol     Patient Measurements: Height: '5\' 5"'$  (165.1 cm) Weight: 116.2 kg (256 lb 2.8 oz) IBW/kg (Calculated) : 57 Heparin Dosing Weight: 84kg  Vital Signs: Temp: 97.7 F (36.5 C) (10/28 1125) Temp Source: Oral (10/28 1125) BP: 147/71 (10/28 1125) Pulse Rate: 82 (10/28 1125)  Labs: Recent Labs    12/19/21 2037 12/19/21 2309 12/20/21 0723 12/20/21 1400 12/20/21 1826 12/21/21 0022  HGB 14.0  --   --  11.8*  --  11.7*  HCT 43.8  --   --  37.0  --  37.1  PLT 351  --   --  244  --  243  LABPROT 13.5  --   --   --   --   --   INR 1.0  --   --   --   --   --   HEPARINUNFRC  --   --  0.68 0.64  --  0.49  CREATININE 2.21*  --   --   --  1.71* 1.55*  TROPONINIHS 3 4  --   --   --   --      Estimated Creatinine Clearance: 44.9 mL/min (A) (by C-G formula based on SCr of 1.55 mg/dL (H)).   Medical History: Past Medical History:  Diagnosis Date   Asthma    Congestive heart failure (CHF) ([place]) 2019   COPD (chronic obstructive pulmonary disease) ([place]) 2021   Diabetes ([place]) 2010   Eczema    Heart attack ([place]) 2015   Heart disease 2008   History of left hip replacement 04/2018   Insulin pump in place     Medications:  Scheduled:   clopidogrel  75 mg Oral Daily   gabapentin  300 mg Oral TID   insulin pump   Subcutaneous TID WC, HS, 0200   isosorbide mononitrate  60 mg Oral Daily   lidocaine  3 patch Transdermal Q24H   mometasone-formoterol  2 puff Inhalation BID   And   umeclidinium bromide  1 puff Inhalation Daily   pantoprazole  40 mg Oral BID AC & HS   rosuvastatin  40 mg Oral QHS   sodium chloride flush  3 mL Intravenous Q12H    Infusions:   heparin 1,400 Units/hr (12/21/21 1054)    Assessment: 65 YOF presenting with SOB and chest pain. Pharmacy consulted to start heparin for presumed PE while awaiting transfer. No anticoagulation prior to admission.  -Heparin level 0.68 units/mL on heparin 1400 units/hr  -Venous doppler negative for DVT -VQ negative for PE  Goal of Therapy:  Heparin level 0.3-0.7 units/ml Monitor platelets by anticoagulation protocol: Yes   Plan:  Continue heparin 1400 units/hr Daily heparin level and CBC Will follow heparin plans  Hildred Laser, PharmD Clinical Pharmacist **Pharmacist phone directory can now be found on [email or web address] ([email or web address]).  Listed under [place].

## 2021-12-21 NOTE — Progress Notes (Signed)
Pt has a dry strong cough and is requesting cough medicine. RN General Robitussin DM order placed.

## 2021-12-21 NOTE — Progress Notes (Signed)
VASCULAR LAB    Bilateral lower extremity venous duplex has been performed.  See CV proc for preliminary results.   Fernando Stoiber, RVT 12/21/2021, 10:08 AM

## 2021-12-21 NOTE — Progress Notes (Signed)
[facility]  PROGRESS NOTE  Katrina Vega BVQ:[id number] DOB: Jan 31, 1956 DOA: 12/19/2021 PCP: Marrian Salvage, FNP   Brief HPI:    66 year old female with history of hypertension, CAD, diastolic heart failure, diabetes mellitus type 2 on insulin pump, asthma/COPD overlap syndrome, PAD, OSA on CPAP, morbid obesity presented with shortness of breath, substernal chest pain for past 1 and half week.  She had been seen in the emergency department on 10/16 due to complaints of chest pain.  At that time patient had chest x-ray which noted mild bilateral interstitial densities concerning for pulmonary edema.  She had been given Lasix 20 mg IV and discharged home.  She followed up cardiology on 10/19 and was recommended to be on Lasix 80 mg daily.  Despite this patient reported urinating infrequently.  She followed up in the office on 10/23 and had been switched to Bumex 1 mg daily with plans to restart Jardiance.  In the ED patient was found to have BUN/creatinine of 25/2.21, D-dimer was elevated at 4.58.  Subjective   Patient seen and examined, still has intermittent chest discomfort.  She does have history of GERD and takes Protonix at home.   Assessment/Plan:    Atypical chest pain -Unclear etiology, troponin x2 are unremarkable -Patient has chest wall tenderness to palpation in sternum -D-dimer was elevated at 4.58, venous duplex of lower extremities is negative, VQ scan is negative -Discontinue full dose anticoagulation with heparin and start Lovenox 40 mg subcu daily -She is currently not requiring oxygen -She does have history of GERD, patient takes Protonix and Maalox at home -We will give 1 dose of Maalox, continue Protonix -We will give 1 dose of Toradol 15 mg IV x1 -Start Robaxin 500 mg p.o. 3 times daily   Acute kidney injury on CKD stage III a -BUN/creatinine 25/2.21 -Last creatinine was 1.14 on 10/16 -Likely from overdiuresis -Hold diuretics at this  time -Creatinine has improved to 1.55 with IV fluids -Follow BMP in am  Chronic diastolic heart failure -Last echocardiogram from 12/16/2021 showed EF 59%, with grade 1 diastolic dysfunction -BNP was 8.7 -Diuretics on hold due to acute kidney injury as above  Hypercalcemia -Calcium was elevated 10.7 -Repeat calcium this morning is 9.1  Asthma/COPD -Not in exacerbation -Continue as needed butyryl  Hypertension Home blood pressure regimen included isosorbide mononitrate 60 mg daily, lisinopril 20 mg daily, and metoprolol 50 mg every 12 hours. -Continue isosorbide mononitrate -We will start her back on metoprolol, continue to hold lisinopril  Medications     clopidogrel  75 mg Oral Daily   enoxaparin (LOVENOX) injection  40 mg Subcutaneous QHS   gabapentin  300 mg Oral TID   insulin pump   Subcutaneous TID WC, HS, 0200   isosorbide mononitrate  60 mg Oral Daily   lidocaine  3 patch Transdermal Q24H   mometasone-formoterol  2 puff Inhalation BID   And   umeclidinium bromide  1 puff Inhalation Daily   pantoprazole  40 mg Oral BID AC & HS   rosuvastatin  40 mg Oral QHS   sodium chloride flush  3 mL Intravenous Q12H     Data Reviewed:   CBG:  Recent Labs  Lab 12/20/21 1626 12/20/21 2104 12/21/21 0026 12/21/21 0307 12/21/21 1126  GLUCAP 83 104* 156* 113* 158*    SpO2: 94 %    Vitals:   12/21/21 0848 12/21/21 0850 12/21/21 1100 12/21/21 1125  BP:   (!) 146/71 (!) 147/71  Pulse:   82  82  Resp:   16 (!) 22  Temp:    97.7 F (36.5 C)  TempSrc:    Oral  SpO2: 97% 97% 95% 94%  Weight:      Height:          Data Reviewed:  Basic Metabolic Panel: Recent Labs  Lab 12/17/21 1107 12/19/21 2037 12/20/21 1826 12/21/21 0022  NA 138 137 140 140  K 4.3 3.9 3.9 3.6  CL 102 102 108 112*  CO2 30 24 21* 22  GLUCOSE 110* 130* 108* 150*  BUN 18 25* 20 18  CREATININE 1.44* 2.21* 1.71* 1.55*  CALCIUM 10.6* 10.7* 9.3 9.1    CBC: Recent Labs  Lab  12/19/21 2037 12/20/21 1400 12/21/21 0022  WBC 5.5 4.8 5.0  HGB 14.0 11.8* 11.7*  HCT 43.8 37.0 37.1  MCV 82.3 83.3 83.2  PLT 351 244 243    LFT Recent Labs  Lab 12/17/21 1107  AST 15  ALT 12  ALKPHOS 62  BILITOT 0.5  PROT 7.3  ALBUMIN 4.6     Antibiotics: Anti-infectives (From admission, onward)    None        DVT prophylaxis: Full dose heparin, changed to Lovenox for DVT prophylaxis  Code Status: Full code  Family Communication:    CONSULTS    Objective    Physical Examination:   General: Appears in no acute distress Cardiovascular: S1-S2, regular, no murmur auscultated Respiratory: Clear to auscultation bilaterally Abdomen: Abdomen is soft, nontender, no organomegaly Extremities: No edema in the lower extremities Neurologic: Alert, oriented x3, intact insight and judgment, no focal deficit noted   Status is: Inpatient:           [place]   [facility] If 7PM-7AM, please contact night-coverage at [email or web address], Office  [phone]   12/21/2021, 3:09 PM  LOS: 0 days

## 2021-12-22 DIAGNOSIS — R0602 Shortness of breath: Secondary | ICD-10-CM | POA: Diagnosis not present

## 2021-12-22 DIAGNOSIS — N189 Chronic kidney disease, unspecified: Secondary | ICD-10-CM | POA: Diagnosis not present

## 2021-12-22 DIAGNOSIS — R079 Chest pain, unspecified: Secondary | ICD-10-CM | POA: Diagnosis not present

## 2021-12-22 DIAGNOSIS — N179 Acute kidney failure, unspecified: Secondary | ICD-10-CM | POA: Diagnosis not present

## 2021-12-22 DIAGNOSIS — J4489 Other specified chronic obstructive pulmonary disease: Secondary | ICD-10-CM | POA: Diagnosis not present

## 2021-12-22 LAB — CBC
HCT: 35.7 % — ABNORMAL LOW (ref 36.0–46.0)
Hemoglobin: 11.3 g/dL — ABNORMAL LOW (ref 12.0–15.0)
MCH: 26.3 pg (ref 26.0–34.0)
MCHC: 31.7 g/dL (ref 30.0–36.0)
MCV: 83 fL (ref 80.0–100.0)
Platelets: 231 10*3/uL (ref 150–400)
RBC: 4.3 MIL/uL (ref 3.87–5.11)
RDW: 15.7 % — ABNORMAL HIGH (ref 11.5–15.5)
WBC: 5.2 10*3/uL (ref 4.0–10.5)
nRBC: 0 % (ref 0.0–0.2)

## 2021-12-22 LAB — BASIC METABOLIC PANEL
Anion gap: 8 (ref 5–15)
BUN: 14 mg/dL (ref 8–23)
CO2: 22 mmol/L (ref 22–32)
Calcium: 9.5 mg/dL (ref 8.9–10.3)
Chloride: 107 mmol/L (ref 98–111)
Creatinine, Ser: 1.67 mg/dL — ABNORMAL HIGH (ref 0.44–1.00)
GFR, Estimated: 33 mL/min — ABNORMAL LOW (ref >60)
Glucose, Bld: 160 mg/dL — ABNORMAL HIGH (ref 70–99)
Potassium: 3.7 mmol/L (ref 3.5–5.1)
Sodium: 137 mmol/L (ref 135–145)

## 2021-12-22 LAB — UREA NITROGEN, URINE: Urea Nitrogen, Ur: 361 mg/dL

## 2021-12-22 LAB — GLUCOSE, CAPILLARY
Glucose-Capillary: 134 mg/dL — ABNORMAL HIGH (ref 70–99)
Glucose-Capillary: 141 mg/dL — ABNORMAL HIGH (ref 70–99)

## 2021-12-22 MED ORDER — METHOCARBAMOL 500 MG PO TABS: 500.0000 mg | ORAL_TABLET | Freq: Three times a day (TID) | ORAL | 0 refills | Status: DC | PRN

## 2021-12-22 MED ORDER — ACETAMINOPHEN 500 MG PO TABS
1000.0000 mg | ORAL_TABLET | Freq: Three times a day (TID) | ORAL | Status: DC | PRN
  Administered 2021-12-22: 1000 mg via ORAL
  Filled 2021-12-22: qty 2

## 2021-12-22 NOTE — Progress Notes (Signed)
Return to work letter given to pt.

## 2021-12-22 NOTE — Evaluation (Signed)
Physical Therapy Evaluation Patient Details Name: Katrina Vega MRN: [id number] DOB: 12-15-1955 Today's Date: 12/22/2021  History of Present Illness  Pt is a 66 y.o. female who presented 12/19/21 with SOB and substernal CP x1.5 weeks. High-sensitivity troponins negative x2 and EKG without significant ischemic changes. Negative for PE and DVT. PMH: HTN, CAD, diastolic CHF, DM2 on inuslin pump, asthma-COPD overlap syndrome, PAD, OSA on CPAP, morbid obesity, heart attack, hx of L hip replacement, CKD stage IIIa   Clinical Impression  Pt presents with condition above and deficits mentioned below, see PT Problem List. PTA, she was IND without DME, working in customer service, and living with a roommate in a 1-level apartment on the ground floor. Currently, pt is limited primarily by her DOE. When she tries to ambulate at a more standard or increased gait speed she gets SOB quickly with RR up to 40s but SpO2 remained >/= 90% on RA. She was also initially lightheaded upon standing, see BP measures in General Comments below, but this appeared to improve with increased activity. As her symptoms improved with increased activity, so did her balance. She initially required minA to recover with x1 LOB but by the end of the session her gait was much more steady and she did not have any further LOB bouts or require assistance. This was probably related to not having been able to get OOB for the past couple days. Will continue to follow acutely to maximize her return to baseline, but recommend Cardiac Rehab follow-up at d/c as her primary deficit is her endurance related to her medical conditions.       Recommendations for follow up therapy are one component of a multi-disciplinary discharge planning process, led by the attending physician.  Recommendations may be updated based on patient status, additional functional criteria and insurance authorization.  Follow Up Recommendations Other (comment) (Cardiac Rehab)       Assistance Recommended at Discharge PRN  Patient can return home with the following  Assistance with cooking/housework    Equipment Recommendations None recommended by PT  Recommendations for Other Services       Functional Status Assessment Patient has had a recent decline in their functional status and demonstrates the ability to make significant improvements in function in a reasonable and predictable amount of time.     Precautions / Restrictions Precautions Precautions: Fall;Other (comment) Precaution Comments: watch vitals Restrictions Weight Bearing Restrictions: No      Mobility  Bed Mobility Overal bed mobility: Modified Independent             General bed mobility comments: Pt able to transition supine <> sit EOB without assistance, HOB elevated    Transfers Overall transfer level: Needs assistance Equipment used: 1 person hand held assist Transfers: Sit to/from Stand Sit to Stand: Min guard           General transfer comment: Min guard for safety, mild trunk sway and pt holding onto bed rail for support, no LOB though    Ambulation/Gait Ambulation/Gait assistance: Min guard, Min assist Gait Distance (Feet): 260 Feet Assistive device: None Gait Pattern/deviations: Step-through pattern, Decreased stride length, Wide base of support Gait velocity: reduced Gait velocity interpretation: <1.8 ft/sec, indicate of risk for recurrent falls   General Gait Details: Pt initially with trunk sway and had x1 LOB requiring minA to recover. However, this was first time pt had been OOB in a couple days and was feeling lightheaded initially, see General Comments below in regards to vitals.  As distance progressed, her stability improved and she no longer had a noticable sway and no further LOB bouts. Pt able to change head positions without LOB and able to increase speed, but gets SOB quickly if so. Min guard assist all other times than that x1 LOB  Stairs             Wheelchair Mobility    Modified Rankin (Stroke Patients Only)       Balance Overall balance assessment: Mild deficits observed, not formally tested (initially)                                           Pertinent Vitals/Pain Pain Assessment Pain Assessment: Faces Faces Pain Scale: Hurts a little bit Pain Location: discomfort with SOB Pain Descriptors / Indicators: Discomfort Pain Intervention(s): Limited activity within patient's tolerance, Monitored during session    Home Living Family/patient expects to be discharged to:: Private residence Living Arrangements: Non-relatives/Friends Available Help at Discharge: Friend(s);Available PRN/intermittently Type of Home: Apartment Home Access: Level entry       Home Layout: One level Home Equipment: Toilet riser;Shower seat;Other (comment) (adjustable bed)      Prior Function Prior Level of Function : Independent/Modified Independent;Driving;Working/employed               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        Extremity/Trunk Assessment   Upper Extremity Assessment Upper Extremity Assessment: Overall WFL for tasks assessed    Lower Extremity Assessment Lower Extremity Assessment: Overall WFL for tasks assessed    Cervical / Trunk Assessment Cervical / Trunk Assessment: Other exceptions Cervical / Trunk Exceptions: increased body habitus  Communication   Communication: No difficulties  Cognition Arousal/Alertness: Awake/alert Behavior During Therapy: WFL for tasks assessed/performed Overall Cognitive Status: Within Functional Limits for tasks assessed                                          General Comments General comments (skin integrity, edema, etc.): SpO2 >/= 90% on RA throughout, RR up to 40s when cued to ambulate quickly but recovered quickly with slowed gait, BP 146/69 supine, 149/85 sitting, 150/78 standing, 143/81 standing  ~3 min; educated pt on progressing mobility/endurance and managing her CHF    Exercises     Assessment/Plan    PT Assessment Patient needs continued PT services  PT Problem List Decreased activity tolerance;Decreased balance;Decreased mobility;Cardiopulmonary status limiting activity;Obesity       PT Treatment Interventions DME instruction;Gait training;Functional mobility training;Therapeutic activities;Therapeutic exercise;Balance training;Neuromuscular re-education;Patient/family education;Stair training    PT Goals (Current goals can be found in the Care Plan section)  Acute Rehab PT Goals Patient Stated Goal: to get cardiac rehab PT Goal Formulation: With patient Time For Goal Achievement: 01/05/22 Potential to Achieve Goals: Good    Frequency Min 3X/week     Co-evaluation               AM-PAC PT "6 Clicks" Mobility  Outcome Measure Help needed turning from your back to your side while in a flat bed without using bedrails?: None Help needed moving from lying on your back to sitting on the side of a flat bed without using bedrails?: None Help needed moving to and from a bed  to a chair (including a wheelchair)?: A Little Help needed standing up from a chair using your arms (e.g., wheelchair or bedside chair)?: A Little Help needed to walk in hospital room?: A Little Help needed climbing 3-5 steps with a railing? : A Little 6 Click Score: 20    End of Session Equipment Utilized During Treatment: Gait belt Activity Tolerance: Patient tolerated treatment well Patient left: in bed;with call bell/phone within reach Nurse Communication: Mobility status PT Visit Diagnosis: Unsteadiness on feet (R26.81);Other abnormalities of gait and mobility (R26.89);Difficulty in walking, not elsewhere classified (R26.2)    Time: 4035-2481 PT Time Calculation (min) (ACUTE ONLY): 51 min   Charges:   PT Evaluation $PT Eval Moderate Complexity: 1 Mod PT Treatments $Gait Training:  8-22 mins $Therapeutic Activity: 8-22 mins        Moishe Spice, PT, DPT Acute Rehabilitation Services  Office: [phone]   Orvan Falconer 12/22/2021, 11:09 AM

## 2021-12-22 NOTE — Progress Notes (Signed)
Provided with incentive spirometry, obtained 1500 ml with good technique.Encouraged to do it at least 10 x every hour to help with her lung expansion

## 2021-12-22 NOTE — Progress Notes (Signed)
All set for discharge home, ride will be available at 12 noon.

## 2021-12-22 NOTE — Discharge Summary (Signed)
Physician Discharge Summary   Patient: Katrina Vega MRN: [id number] DOB: Oct 30, 1955  Admit date:     12/19/2021  Discharge date: 12/22/21  Discharge Physician: Oswald Hillock   PCP: Marrian Salvage, FNP   Recommendations at discharge:   Follow-up cardiology as outpatient  Discharge Diagnoses: Principal Problem:   SOB (shortness of breath) Active Problems:   Chest pain   Acute kidney injury superimposed on chronic kidney disease (HCC)   Type 2 diabetes mellitus, with long-term current use of insulin (HCC)   Chronic diastolic congestive heart failure (HCC)   Hypercalcemia   Asthma-COPD overlap syndrome   Essential hypertension   Obstructive sleep apnea (adult) (pediatric)  Resolved Problems:   * No resolved hospital problems. *  Hospital Course:  66 year old female with history of hypertension, CAD, diastolic heart failure, diabetes mellitus type 2 on insulin pump, asthma/COPD overlap syndrome, PAD, OSA on CPAP, morbid obesity presented with shortness of breath, substernal chest pain for past 1 and half week.   She had been seen in the emergency department on 10/16 due to complaints of chest pain.  At that time patient had chest x-ray which noted mild bilateral interstitial densities concerning for pulmonary edema.  She had been given Lasix 20 mg IV and discharged home.  She followed up cardiology on 10/19 and was recommended to be on Lasix 80 mg daily.  Despite this patient reported urinating infrequently.  She followed up in the office on 10/23 and had been switched to Bumex 1 mg daily with plans to restart Jardiance.   In the ED patient was found to have BUN/creatinine of 25/2.21, D-dimer was elevated at 4.58  Assessment and Plan:  Atypical chest pain -Resolved -Unclear etiology, troponin x2 are unremarkable -Patient has chest wall tenderness to palpation in sternum -D-dimer was elevated at 4.58, venous duplex of lower extremities is negative, VQ scan is  negative -She was started on full dose anticoagulation with IV heparin which was discontinued  -She is currently not requiring oxygen -She does have history of GERD, patient takes Protonix and Maalox at home -Improved after starting Maalox, Robaxin -We will discharge on Robaxin as needed for pain     Acute kidney injury on CKD stage III a -BUN/creatinine 25/2.21 -Last creatinine was 1.14 on 10/16 -Likely from overdiuresis -Creatinine has improved to 1.66 -Will resume Bumex 1 mg daily at home    Chronic diastolic heart failure -Last echocardiogram from 12/16/2021 showed EF 59%, with grade 1 diastolic dysfunction -BNP was 8.7 -Bumex resumed as above   Hypercalcemia -Calcium was elevated 10.7 -Repeat calcium this morning is 9.1   Asthma/COPD -Not in exacerbation -Continue as needed butyryl   Hypertension -Continue home regimen      Consultants:  Procedures performed:   Disposition: Home Diet recommendation:  Discharge Diet Orders (From admission, onward)     Start     Ordered   12/22/21 0000  Diet - low sodium heart healthy        12/22/21 0950           Cardiac diet DISCHARGE MEDICATION: Allergies as of 12/22/2021       Reactions   Contrast Media [iodinated Contrast Media] Shortness Of Breath, Other (See Comments)   sob, chest tight   Morphine Itching   Statins Diarrhea, Other (See Comments)   nausea, diarrhea, myalgias   Ioversol         Medication List     TAKE these medications    albuterol (2.5  MG/3ML) 0.083% nebulizer solution Commonly known as: PROVENTIL Take 3 mLs (2.5 mg total) by nebulization every 6 (six) hours as needed for wheezing or shortness of breath.   albuterol 108 (90 Base) MCG/ACT inhaler Commonly known as: VENTOLIN HFA Inhale 2 puffs into the lungs every 6 (six) hours as needed for wheezing.   BD AutoShield Duo 30G X 5 MM Misc Generic drug: Insulin Pen Needle 1 KIT BY MISC ROUTE 3 TIMES DAILY BEFORE MEALS    benzonatate 200 MG capsule Commonly known as: TESSALON Take 1 capsule (200 mg total) by mouth 2 (two) times daily as needed for cough.   Breztri Aerosphere 160-9-4.8 MCG/ACT Aero Generic drug: Budeson-Glycopyrrol-Formoterol Inhale two puffs twice daily to prevent cough or wheeze.  Rinse, gargle, and spit after use.   bumetanide 1 MG tablet Commonly known as: Bumex Take 1 tablet (1 mg total) by mouth daily.   CareTouch 2 CPAP Hose Hanger Misc by Does not apply route.   cetirizine 10 MG tablet Commonly known as: ZYRTEC Take 1 tablet by mouth daily.   Cholecalciferol 50 MCG (2000 UT) Tabs Take 2,000 Units by mouth daily.   clopidogrel 75 MG tablet Commonly known as: PLAVIX Take 1 tablet (75 mg total) by mouth daily.   cromolyn 4 % ophthalmic solution Commonly known as: OPTICROM Use 1 drop in each eye up to 4 times a day as needed for red, itchy, or watery eyes   Dexcom G6 Sensor Misc USE 1 SENSOR AS NEEDED CHANGE EVERY 10 DAYS   empagliflozin 10 MG Tabs tablet Commonly known as: Jardiance Take 1 tablet (10 mg total) by mouth daily before breakfast.   EPINEPHrine 0.3 mg/0.3 mL Soaj injection Commonly known as: EpiPen 2-Pak Use as directed for severe allergic reactions   gabapentin 300 MG capsule Commonly known as: NEURONTIN TAKE 1 CAPSULE BY MOUTH 3 TIMES  DAILY   glucagon 1 MG injection Inject 1 mg into the muscle once as needed (low blood sugar).   glucose 4 GM chewable tablet Chew 1 tablet by mouth once as needed for low blood sugar.   HumaLOG 100 UNIT/ML injection Generic drug: insulin lispro Inject into the skin.   HumuLIN N 100 UNIT/ML injection Generic drug: insulin NPH Human Inject 60 Units into the skin 3 (three) times daily before meals.   isosorbide mononitrate 60 MG 24 hr tablet Commonly known as: IMDUR Take 1 tablet (60 mg total) by mouth daily.   lisinopril 20 MG tablet Commonly known as: ZESTRIL Take 1 tablet (20 mg total) by mouth  daily.   metoprolol tartrate 50 MG tablet Commonly known as: LOPRESSOR Take 1 tablet (50 mg total) by mouth every 12 (twelve) hours.   montelukast 10 MG tablet Commonly known as: SINGULAIR TAKE 1 TABLET(10 MG) BY MOUTH AT BEDTIME What changed:  how much to take how to take this when to take this additional instructions   nitroGLYCERIN 0.4 MG SL tablet Commonly known as: NITROSTAT Place 1 tablet (0.4 mg total) under the tongue every 5 (five) minutes as needed for chest pain.   Ozempic (2 MG/DOSE) 8 MG/3ML Sopn Generic drug: Semaglutide (2 MG/DOSE) Inject 2 mg into the skin once a week.   pantoprazole 40 MG tablet Commonly known as: PROTONIX TAKE 1 TABLET BY MOUTH IN THE  MORNING AND AT BEDTIME What changed: See the new instructions.   potassium chloride 10 MEQ tablet Commonly known as: KLOR-CON Take 1 tablet (10 mEq total) by mouth daily.   rosuvastatin 40  MG tablet Commonly known as: CRESTOR Take 1 tablet (40 mg total) by mouth at bedtime.        Discharge Exam: Filed Weights   12/20/21 0100 12/21/21 0524  Weight: 114 kg 116.2 kg   General-appears in no acute distress Heart-S1-S2, regular, no murmur auscultated Lungs-clear to auscultation bilaterally, no wheezing or crackles auscultated Abdomen-soft, nontender, no organomegaly Extremities-no edema in the lower extremities Neuro-alert, oriented x3, no focal deficit noted  Condition at discharge: good  The results of significant diagnostics from this hospitalization (including imaging, microbiology, ancillary and laboratory) are listed below for reference.   Imaging Studies: NM Pulmonary Perfusion  Result Date: 12/21/2021 CLINICAL DATA:  Shortness of breath for 2 weeks.  Chest pain. EXAM: NUCLEAR MEDICINE PERFUSION LUNG SCAN TECHNIQUE: Perfusion images were obtained in multiple projections after intravenous injection of radiopharmaceutical. Ventilation scans intentionally deferred if perfusion scan and chest  x-ray adequate for interpretation during COVID 19 epidemic. RADIOPHARMACEUTICALS:  4.1 mCi Tc-92mMAA IV COMPARISON:  Chest radiograph 12/21/2021 FINDINGS: There are no suspicious segmental perfusion defects to suggest an acute pulmonary embolus. IMPRESSION: 1. Negative for acute pulmonary embolus. Electronically Signed   By: TKerby MoorsM.D.   On: 12/21/2021 11:11   VAS [place]OWER EXTREMITY VENOUS (DVT)  Result Date: 12/21/2021  Lower Venous DVT Study Patient Name:  ADENISHA HOEL Date of Exam:   12/21/2021 Medical Rec #: [id number]     Accession #:    [id number] of Birth: [birth year]    Patient Gender: F Patient Age:   89years [facility]      VAS [place]OWER EXTREMITY VENOUS (DVT) Referring Phys: RFuller Plan--------------------------------------------------------------------------------  Indications: Edema, and Elevated D-Dimer.  Comparison Study: No prior study on file Performing Technologist: CSharion DoveRVS  Examination Guidelines: A complete evaluation includes B-mode imaging, spectral Doppler, color Doppler, and power Doppler as needed of all accessible portions of each vessel. Bilateral testing is considered an integral part of a complete examination. Limited examinations for reoccurring indications may be performed as noted. The reflux portion of the exam is performed with the patient in reverse Trendelenburg.  +---------+---------------+---------+-----------+----------+--------------+ RIGHT    CompressibilityPhasicitySpontaneityPropertiesThrombus Aging +---------+---------------+---------+-----------+----------+--------------+ CFV      Full           Yes      Yes                                 +---------+---------------+---------+-----------+----------+--------------+ SFJ      Full                                                        +---------+---------------+---------+-----------+----------+--------------+ FV Prox  Full                                                         +---------+---------------+---------+-----------+----------+--------------+ FV Mid   Full                                                        +---------+---------------+---------+-----------+----------+--------------+  FV DistalFull                                                        +---------+---------------+---------+-----------+----------+--------------+ PFV      Full                                                        +---------+---------------+---------+-----------+----------+--------------+ POP      Full           Yes      Yes                                 +---------+---------------+---------+-----------+----------+--------------+ PTV      Full                                                        +---------+---------------+---------+-----------+----------+--------------+ PERO     Full                                                        +---------+---------------+---------+-----------+----------+--------------+   +---------+---------------+---------+-----------+----------+--------------+ LEFT     CompressibilityPhasicitySpontaneityPropertiesThrombus Aging +---------+---------------+---------+-----------+----------+--------------+ CFV      Full           Yes      Yes                                 +---------+---------------+---------+-----------+----------+--------------+ SFJ      Full                                                        +---------+---------------+---------+-----------+----------+--------------+ FV Prox  Full                                                        +---------+---------------+---------+-----------+----------+--------------+ FV Mid   Full                                                        +---------+---------------+---------+-----------+----------+--------------+ FV DistalFull                                                         +---------+---------------+---------+-----------+----------+--------------+  PFV      Full                                                        +---------+---------------+---------+-----------+----------+--------------+ POP      Full           Yes      Yes                                 +---------+---------------+---------+-----------+----------+--------------+ PTV      Full                                                        +---------+---------------+---------+-----------+----------+--------------+ PERO     Full                                                        +---------+---------------+---------+-----------+----------+--------------+     Summary: BILATERAL: - No evidence of deep vein thrombosis seen in the lower extremities, bilaterally. -No evidence of popliteal cyst, bilaterally.   *See table(s) above for measurements and observations. Electronically signed by Jamelle Haring on 12/21/2021 at 10:42:15 AM.    Final    DG Chest 1 View  Result Date: 12/21/2021 CLINICAL DATA:  Provided history: Lung abnormality EXAM: CHEST  1 VIEW COMPARISON:  Chest radiograph 12/19/2021. FINDINGS: Heart is normal in size for technique. Stable mediastinal contours, aortic atherosclerosis. Ill-defined opacities at the right lung base which are suboptimally assessed on this portable exam. No new airspace disease. No pleural effusion or pneumothorax. Reverse right shoulder arthroplasty. IMPRESSION: Unchanged ill-defined opacities at the right lung base, favor atelectasis. Electronically Signed   By: Keith Rake M.D.   On: 12/21/2021 09:46   DG Chest 2 View  Result Date: 12/19/2021 CLINICAL DATA:  Chest pain and shortness of breath EXAM: CHEST - 2 VIEW COMPARISON:  12/17/2021 FINDINGS: Cardiac shadow is stable. Aortic calcifications are noted. Lungs are well aerated bilaterally. No focal infiltrate or effusion is seen. Degenerative changes of the thoracic spine are noted.  IMPRESSION: No active cardiopulmonary disease. Electronically Signed   By: Inez Catalina M.D.   On: 12/19/2021 20:29   DG Chest 2 View  Result Date: 12/17/2021 CLINICAL DATA:  Pulmonary edema. Shortness of breath and chest pain. EXAM: CHEST - 2 VIEW COMPARISON:  Chest two views 12/09/2021 FINDINGS: The cardiac silhouette is again mildly enlarged. Mediastinal contours are within normal limits with moderate calcification again seen within aortic arch. Minimal bilateral lower lung interstitial thickening is similar to prior. No pleural effusion pneumothorax. Moderate multilevel degenerative disc changes of the thoracic spine. Cholecystectomy clips. Reverse total right shoulder arthroplasty. IMPRESSION: Minimal bilateral lower lung interstitial thickening is similar to prior and may represent mild interstitial edema. Electronically Signed   By: Yvonne Kendall M.D.   On: 12/17/2021 11:54   PCV ECHOCARDIOGRAM COMPLETE  Result Date: 12/16/2021 Echocardiogram 12/16/2021: Left ventricle cavity is normal in size. Mild concentric hypertrophy of  the left ventricle. Normal global wall motion. Normal LV systolic function with EF 59%. Doppler evidence of grade I (impaired) diastolic dysfunction, normal LAP. Left atrial cavity is mildly dilated. No significant valvular abnormality, IVC not well visualized.   DG Chest 2 View  Result Date: 12/09/2021 CLINICAL DATA:  Chest pain. EXAM: CHEST - 2 VIEW COMPARISON:  None Available. FINDINGS: Mild cardiomegaly is noted. Mild interstitial densities are noted in the perihilar and basilar regions concerning for pulmonary edema. Bony thorax is unremarkable. IMPRESSION: Mild bilateral interstitial densities are noted concerning for pulmonary edema. Electronically Signed   By: Marijo Conception M.D.   On: 12/09/2021 16:45    Microbiology: Results for orders placed or performed during the hospital encounter of 12/19/21  SARS Coronavirus 2 by RT PCR (hospital order, performed in  [facility] hospital lab) *cepheid single result test* Anterior Nasal Swab     Status: None   Collection Time: 12/20/21 12:09 AM   Specimen: Anterior Nasal Swab  Result Value Ref Range Status   SARS Coronavirus 2 by RT PCR NEGATIVE NEGATIVE Final    Comment: (NOTE) SARS-CoV-2 target nucleic acids are NOT DETECTED.  The SARS-CoV-2 RNA is generally detectable in upper and lower respiratory specimens during the acute phase of infection. The lowest concentration of SARS-CoV-2 viral copies this assay can detect is 250 copies / mL. A negative result does not preclude SARS-CoV-2 infection and should not be used as the sole basis for treatment or other patient management decisions.  A negative result may occur with improper specimen collection / handling, submission of specimen other than nasopharyngeal swab, presence of viral mutation(s) within the areas targeted by this assay, and inadequate number of viral copies (<250 copies / mL). A negative result must be combined with clinical observations, patient history, and epidemiological information.  Fact Sheet for Patients:   https://www.patel.info/  Fact Sheet for Healthcare Providers: https://hall.com/  This test is not yet approved or  cleared by the [place] FDA and has been authorized for detection and/or diagnosis of SARS-CoV-2 by FDA under an Emergency Use Authorization (EUA).  This EUA will remain in effect (meaning this test can be used) for the duration of the COVID-19 declaration under Section 564(b)(1) of the Act, 21 U.S.C. section 360bbb-3(b)(1), unless the authorization is terminated or revoked sooner.  Performed at [facility], [address] [place], [place] [address]     Labs: CBC: Recent Labs  Lab 12/19/21 2037 12/20/21 1400 12/21/21 0022 12/22/21 0019  WBC 5.5 4.8 5.0 5.2  HGB 14.0 11.8* 11.7* 11.3*  HCT 43.8 37.0 37.1 35.7*  MCV 82.3 83.3 83.2 83.0   PLT 351 244 243 947   Basic Metabolic Panel: Recent Labs  Lab 12/17/21 1107 12/19/21 2037 12/20/21 1826 12/21/21 0022 12/22/21 0019  NA 138 137 140 140 137  K 4.3 3.9 3.9 3.6 3.7  CL 102 102 108 112* 107  CO2 30 24 21* 22 22  GLUCOSE 110* 130* 108* 150* 160*  BUN 18 25* _0 CREATININE 1.44* 2.21* 1.71* 1.55* 1.67*  CALCIUM 10.6* 10.7* 9.3 9.1 9.5   Liver Function Tests: Recent Labs  Lab 12/17/21 1107  AST 15  ALT 12  ALKPHOS 62  BILITOT 0.5  PROT 7.3  ALBUMIN 4.6   CBG: Recent Labs  Lab 12/21/21 0307 12/21/21 1126 12/21/21 1554 12/21/21 2156 12/22/21 0739  GLUCAP 113* 158* 114* 108* 134*    Discharge time spent: greater than 30 minutes.  Signed: Oswald Hillock, MD [facility] 12/22/2021

## 2021-12-26 ENCOUNTER — Encounter: Payer: Self-pay | Admitting: Family

## 2021-12-27 ENCOUNTER — Ambulatory Visit: Payer: Medicare Other

## 2022-01-02 ENCOUNTER — Ambulatory Visit: Payer: Medicare Other

## 2022-01-02 ENCOUNTER — Telehealth: Payer: Self-pay

## 2022-01-02 NOTE — Telephone Encounter (Signed)
Thank you Dee

## 2022-01-02 NOTE — Telephone Encounter (Signed)
Referral has been placed to [facility]. I called and left the patient a detailed voicemail. I will also send her a MyChart with their information.

## 2022-01-02 NOTE — Telephone Encounter (Signed)
-----   Message from Althea Charon, [place] sent at 12/16/2021  6:06 PM EDT ----- Please refer to GI due to reflux despite being on pantoprazole twice a day.

## 2022-01-03 ENCOUNTER — Ambulatory Visit: Payer: Medicare Other | Attending: Cardiology | Admitting: Cardiology

## 2022-01-03 ENCOUNTER — Encounter: Payer: Self-pay | Admitting: Cardiology

## 2022-01-03 VITALS — BP 100/60 | HR 84 | Ht 65.0 in | Wt 247.6 lb

## 2022-01-03 DIAGNOSIS — I5032 Chronic diastolic (congestive) heart failure: Secondary | ICD-10-CM

## 2022-01-03 DIAGNOSIS — I2510 Atherosclerotic heart disease of native coronary artery without angina pectoris: Secondary | ICD-10-CM | POA: Diagnosis not present

## 2022-01-03 DIAGNOSIS — I10 Essential (primary) hypertension: Secondary | ICD-10-CM | POA: Diagnosis not present

## 2022-01-03 DIAGNOSIS — N183 Chronic kidney disease, stage 3 unspecified: Secondary | ICD-10-CM | POA: Diagnosis not present

## 2022-01-03 DIAGNOSIS — E1122 Type 2 diabetes mellitus with diabetic chronic kidney disease: Secondary | ICD-10-CM | POA: Diagnosis not present

## 2022-01-03 DIAGNOSIS — E785 Hyperlipidemia, unspecified: Secondary | ICD-10-CM

## 2022-01-03 DIAGNOSIS — N1832 Chronic kidney disease, stage 3b: Secondary | ICD-10-CM | POA: Diagnosis not present

## 2022-01-03 DIAGNOSIS — Z794 Long term (current) use of insulin: Secondary | ICD-10-CM | POA: Diagnosis not present

## 2022-01-03 DIAGNOSIS — E1169 Type 2 diabetes mellitus with other specified complication: Secondary | ICD-10-CM | POA: Diagnosis not present

## 2022-01-03 DIAGNOSIS — J4489 Other specified chronic obstructive pulmonary disease: Secondary | ICD-10-CM | POA: Diagnosis not present

## 2022-01-03 NOTE — Progress Notes (Unsigned)
Primary Care Provider: Marrian Salvage, [place] Cardiologist: Adrian Prows, MD Electrophysiologist: None  Clinic Note: No chief complaint on file.   ===================================  ASSESSMENT/PLAN   Problem List Items Addressed This Visit   None   ===================================  HPI:    Katrina Vega is a 66 y.o. female who is being seen today for the evaluation of *** at the request of Marrian Salvage,*.  2008: PCI to the LAD and  ramus in 2008 with implantation of Taxus stents, NSTEMI IN 2013 SP BALLOON ANGIOPLASTY TO SMALL RCA  Chronic HFpEF HLD/HTN,  Morbid Obesity - OSA (CPAP intolerant)  DM-2 CKD III PAD - R Post Tib DES, Bilat SFA stents  Katrina Vega was just seen by Ernst Spell, NP on 12/16/21  During last office visit, Lasix was increased to 40 mg twice daily. Since starting Lasix twice daily she has not been urinating frequently and still feels volume overloaded today.  She is still having dyspnea with exertion that has not progressively worsened and feels that her legs are swollen.  She currently using 2 pillows while sleeping.  Shortness of breath is not worse with lying flat. she denies chest pain, palpitations, PND, syncope       ICD-10-CM    1. Chronic diastolic congestive heart failure (HCC)  I50.32 empagliflozin (JARDIANCE) 10 MG TABS tablet      bumetanide (BUMEX) 1 MG tablet     2. Dyspnea on exertion  R06.09 bumetanide (BUMEX) 1 MG tablet     3. Class 3 severe obesity due to excess calories with serious comorbidity and body mass index (BMI) of 40.0 to 44.9 in adult [facility])  E66.01      Z68.41      Recent Hospitalizations: *** ED visit on 12/09/2021 for dyspnea with exertion and fluid volume overload.   10/26 -29 Admitted   Reviewed  CV studies:    The following studies were reviewed today: (if available, images/films reviewed: From Epic Chart or Care Everywhere) Echo (PCV): 12/16/21: Left  ventricle cavity is normal in size. Mild concentric hypertrophy of  the left ventricle. Normal global wall motion. Normal LV systolic function  with EF 59%. Doppler evidence of grade I (impaired) diastolic dysfunction,  normal LAP.  Left atrial cavity is mildly dilated.  No significant valvular abnormality,  IVC not well visualized.   Coronary angioplasty: -RCA PCI (Dr. Tonye Becket) 10/2011 for NSTEMI; LAD/Ramus DES 2008 Taxus;   Coronary angiogram 12/22/2017: A- Left Main: Mild plaquing, no critical lesions seen.  B- LAD: The LAD contains mild plaquing proximally and gives rise to a large high lying 1st diagonal branch in which a 40% ostial stenosis is present. Mid LAD contains a well deployed stent which is patent (2008 Taxus), no evidence for significant restenoses. The distal LAD is very large and free of significant disease.  C- Left Circumflex: Limited views of the left circumflex were obtained due to dye issues. There appears to be a proximal left circumflex and 1st obtuse marginal stented segments which are patent (Taxus 2008) and show no it evidence for significant restenoses.  D- RCA: The RCA is small and contains a patent proximal stent with 50 to 60% mid stenosis and mild plaquing noted distally.  PTCA site from 2013 is patent.   Peripheral arteriogram 12/22/2017:  There are bilateral SFA stents present with evidence for severe right and mild left in stent restenoses present. The bilateral popliteal arteries and proximal trifurcation vessels are patent. However,  there may be moderate to severe lesion involving the mid right anterior tibial artery in particular. In addition, there was poor flow noted in the left anterior tibial artery with 2 vessel runoff probably present bilaterally. Laser atherectomy of right SFA followed by DCB with 5.0 x 120 mm impact.   Echocardiogram 07/04/2019: Normal LV size, mild LVH, hyperdynamic LVEF at >65% with grade 1 diastolic dysfunction.  Otherwise no other  significant abnormality.  Poor quality due to patient body habitus.   Lexiscan nuclear stress test 07/07/2019: Nondiagnostic EKG.  Dyspnea during stress test. No evidence of ischemia.  Soft tissue attenuation noted.  LVEF 72%.  Normal wall motion.  Low risk study.  No significant change from 12/20/2017.   Lower extremity venous duplex 07/02/2019: No evidence of DVT in bilateral lower extremity.   Lower Extremity Arterial Duplex 10/09/2020: Right mid and distal SFA occluded, dampened flow in the popliteal artery via collaterals. Moderate velocity increase at the right profunda femoral artery.  No hemodynamically significant stenoses are identified in the left lower extremity arterial system. This exam reveals normal perfusion of the right lower extremity (ABI 0.97) with monophasic waveform in the PT This exam reveals mildly decreased perfusion of the left lower extremity, noted at the post tibial artery level (ABI 0.90) with monophasic waveform in the left AT.  Compared to 12/16/2019, right SFA stenosis is new.  No significant change in ABI.   Abdominal Aortic Duplex 10/09/2020: The maximum aorta (sac) diameter is 2.51 cm (prox) with mild ectasia in the proximal aorta. Diffuse plaque observed in the proximal, mid and distal aorta.  Bilateral CIA are not well visualized but appear to be severely stenosed with diffuse plaque, however, the flow velocity appears normal.  Clinical correlation recommended. Consider CTA or catheter directed angiography if clinically indicated.   Interval History:   Katrina Vega   CV Review of Symptoms (Summary): {roscv:310661}  REVIEWED OF SYSTEMS   ROS  I have reviewed and (if needed) personally updated the patient's problem list, medications, allergies, past medical and surgical history, social and family history.   PAST MEDICAL HISTORY   Past Medical History:  Diagnosis Date   Asthma    Congestive heart failure (CHF) ([place]) 2019   COPD (chronic  obstructive pulmonary disease) ([place]) 2021   Diabetes ([place]) 2010   Eczema    Heart attack ([place]) 2015   Heart disease 2008   History of left hip replacement 04/2018   Insulin pump in place     PAST SURGICAL HISTORY   Past Surgical History:  Procedure Laterality Date   APPENDECTOMY  1974   BIOPSY  02/22/2021   Procedure: BIOPSY;  Surgeon: Carol Ada, MD;  Location: [facility];  Service: Endoscopy;;   CARPAL TUNNEL RELEASE  2017   COLONOSCOPY WITH PROPOFOL N/A 02/22/2021   Procedure: COLONOSCOPY WITH PROPOFOL;  Surgeon: Carol Ada, MD;  Location: [facility];  Service: Endoscopy;  Laterality: N/A;   ESOPHAGOGASTRODUODENOSCOPY (EGD) WITH PROPOFOL N/A 02/22/2021   Procedure: ESOPHAGOGASTRODUODENOSCOPY (EGD) WITH PROPOFOL;  Surgeon: Carol Ada, MD;  Location: [facility];  Service: Endoscopy;  Laterality: N/A;   LUMBAR SPINE SURGERY  2010   POLYPECTOMY  02/22/2021   Procedure: POLYPECTOMY;  Surgeon: Carol Ada, MD;  Location: [facility];  Service: Endoscopy;;   REPLACEMENT TOTAL KNEE  2017 and 2018   [place]   TOTAL HIP ARTHROPLASTY  04/2018   TOTAL SHOULDER ARTHROPLASTY  11/2019    Immunization  History  Administered Date(s) Administered   Fluad Quad(high Dose 65+) 12/17/2021   Influenza Split 12/26/2010, 11/21/2011   Influenza, Quadrivalent, Recombinant, Inj, Pf 11/10/2018   Influenza,inj,Quad PF,6+ Mos 03/12/2015, 11/10/2015, 11/26/2016, 12/02/2017   Influenza-Unspecified 11/21/2011, 03/12/2015, 12/02/2017   PFIZER(Purple Top)SARS-COV-2 Vaccination 03/17/2019, 04/07/2019   PPD Test 04/28/2017   Pneumococcal Polysaccharide-23 06/14/2012   Tdap 09/24/2010   Zoster Recombinat (Shingrix) 08/07/2015, 11/17/2018   Zoster, Live 08/12/2015    MEDICATIONS/ALLERGIES   Current Meds  Medication Sig   albuterol (PROVENTIL) (2.5 MG/3ML) 0.083% nebulizer solution Take 3 mLs (2.5 mg total) by nebulization every 6 (six) hours  as needed for wheezing or shortness of breath.   albuterol (VENTOLIN HFA) 108 (90 Base) MCG/ACT inhaler Inhale 2 puffs into the lungs every 6 (six) hours as needed for wheezing.   BD AUTOSHIELD DUO 30G X 5 MM MISC 1 KIT BY MISC ROUTE 3 TIMES DAILY BEFORE MEALS   benzonatate (TESSALON) 200 MG capsule Take 1 capsule (200 mg total) by mouth 2 (two) times daily as needed for cough.   Budeson-Glycopyrrol-Formoterol (BREZTRI AEROSPHERE) 160-9-4.8 MCG/ACT AERO Inhale two puffs twice daily to prevent cough or wheeze.  Rinse, gargle, and spit after use.   bumetanide (BUMEX) 1 MG tablet Take 1 tablet (1 mg total) by mouth daily.   cetirizine (ZYRTEC) 10 MG tablet Take 1 tablet by mouth daily.   Cholecalciferol 50 MCG (2000 UT) TABS Take 2,000 Units by mouth daily.   clopidogrel (PLAVIX) 75 MG tablet Take 1 tablet (75 mg total) by mouth daily.   Continuous Blood Gluc Sensor (DEXCOM G6 SENSOR) MISC USE 1 SENSOR AS NEEDED CHANGE EVERY 10 DAYS   cromolyn (OPTICROM) 4 % ophthalmic solution Use 1 drop in each eye up to 4 times a day as needed for red, itchy, or watery eyes   empagliflozin (JARDIANCE) 10 MG TABS tablet Take 1 tablet (10 mg total) by mouth daily before breakfast.   EPINEPHrine (EPIPEN 2-PAK) 0.3 mg/0.3 mL IJ SOAJ injection Use as directed for severe allergic reactions   gabapentin (NEURONTIN) 300 MG capsule TAKE 1 CAPSULE BY MOUTH 3 TIMES  DAILY (Patient taking differently: Take 300 mg by mouth 3 (three) times daily.)   glucagon 1 MG injection Inject 1 mg into the muscle once as needed (low blood sugar).   glucose 4 GM chewable tablet Chew 1 tablet by mouth once as needed for low blood sugar.   HUMALOG 100 UNIT/ML injection Inject into the skin.   HUMULIN N 100 UNIT/ML injection Inject 60 Units into the skin 3 (three) times daily before meals.   isosorbide mononitrate (IMDUR) 60 MG 24 hr tablet Take 1 tablet (60 mg total) by mouth daily.   lisinopril (ZESTRIL) 20 MG tablet Take 1 tablet (20 mg  total) by mouth daily.   methocarbamol (ROBAXIN) 500 MG tablet Take 1 tablet (500 mg total) by mouth every 8 (eight) hours as needed for muscle spasms.   metoprolol tartrate (LOPRESSOR) 50 MG tablet Take 1 tablet (50 mg total) by mouth every 12 (twelve) hours.   montelukast (SINGULAIR) 10 MG tablet TAKE 1 TABLET(10 MG) BY MOUTH AT BEDTIME (Patient taking differently: Take 10 mg by mouth at bedtime.)   nitroGLYCERIN (NITROSTAT) 0.4 MG SL tablet Place 1 tablet (0.4 mg total) under the tongue every 5 (five) minutes as needed for chest pain.   OZEMPIC, 2 MG/DOSE, 8 MG/3ML SOPN Inject 2 mg into the skin once a week.   pantoprazole (PROTONIX) 40 MG tablet TAKE  1 TABLET BY MOUTH IN THE  MORNING AND AT BEDTIME (Patient taking differently: Take 40 mg by mouth 2 (two) times daily.)   potassium chloride (KLOR-CON) 10 MEQ tablet Take 1 tablet (10 mEq total) by mouth daily.   Respiratory Therapy Supplies (CARETOUCH 2 CPAP HOSE HANGER) MISC by Does not apply route.   rosuvastatin (CRESTOR) 40 MG tablet Take 1 tablet (40 mg total) by mouth at bedtime.    Allergies  Allergen Reactions   Contrast Media [Iodinated Contrast Media] Shortness Of Breath and Other (See Comments)    sob, chest tight   Morphine Itching   Statins Diarrhea and Other (See Comments)    nausea, diarrhea, myalgias   Ioversol     SOCIAL HISTORY/FAMILY HISTORY   Reviewed in Epic:   Social History   Tobacco Use   Smoking status: Former    Packs/day: 0.50    Years: 15.00    Total pack years: 7.50    Types: Cigarettes    Quit date: 10/14/1995    Years since quitting: 26.2   Smokeless tobacco: Never  Vaping Use   Vaping Use: Never used  Substance Use Topics   Alcohol use: Not Currently   Drug use: Never   Social History   Social History Narrative   Not on file   Family History  Problem Relation Age of Onset   Heart disease Mother    Breast cancer Mother    Heart attack Father    Lung cancer Father    Eczema Grandson     Allergic rhinitis Neg Hx    Angioedema Neg Hx    Asthma Neg Hx    Atopy Neg Hx    Immunodeficiency Neg Hx    Urticaria Neg Hx     OBJCTIVE -PE, EKG, labs   Wt Readings from Last 3 Encounters:  01/03/22 247 lb 9.6 oz (112.3 kg)  12/21/21 256 lb 2.8 oz (116.2 kg)  12/17/21 251 lb 6.4 oz (114 kg)    Physical Exam: BP 100/60   Pulse 84   Ht _0  (1.651 m)   Wt 247 lb 9.6 oz (112.3 kg)   SpO2 94%   BMI 41.20 kg/m  Physical Exam Obese / Bilat Edema    Adult ECG Report  Rate: *** ;  Rhythm: {rhythm:17366};   Narrative Interpretation: ***  Recent Labs:  ***  Lab Results  Component Value Date   CHOL 110 01/31/2021   HDL 36.70 (L) 01/31/2021   LDLCALC 60 01/31/2021   TRIG 66.0 01/31/2021   CHOLHDL 3 01/31/2021   Lab Results  Component Value Date   CREATININE 1.67 (H) 12/22/2021   BUN 14 12/22/2021   NA 137 12/22/2021   K 3.7 12/22/2021   CL 107 12/22/2021   CO2 22 12/22/2021      Latest Ref Rng & Units 12/22/2021   12:19 AM 12/21/2021   12:22 AM 12/20/2021    2:00 PM  CBC  WBC 4.0 - 10.5 K/uL 5.2  5.0  4.8   Hemoglobin 12.0 - 15.0 g/dL 11.3  11.7  11.8   Hematocrit 36.0 - 46.0 % 35.7  37.1  37.0   Platelets 150 - 400 K/uL 231  243  244     Lab Results  Component Value Date   HGBA1C 7.7 (H) 12/20/2021   No results found for: "TSH"  ================================================== I spent a total of *** minutes with the patient spent in direct patient consultation.  Additional time spent with chart review  /  charting (studies, outside notes, etc): *** min Total Time: *** min  Current medicines are reviewed at length with the patient today.  (+/- concerns) ***  Notice: This dictation was prepared with Dragon dictation along with smart phrase technology. Any transcriptional errors that result from this process are unintentional and may not be corrected upon review.   Studies Ordered:  No orders of the defined types were placed in this  encounter.  No orders of the defined types were placed in this encounter.   Patient Instructions / Medication Changes & Studies & Tests Ordered   There are no Patient Instructions on file for this visit.    Leonie Man, MD, MS Glenetta Hew, M.D., M.S. Interventional Cardiologist  [place]  Pager # [phone] Phone # [phone] [address]. [place], [place] [address]   Thank you for choosing [facility]!!

## 2022-01-03 NOTE — Patient Instructions (Signed)
Medication Instructions:   No changes with current medications   As discussed  monitor your weight daily - use a daily weight if you are 3 lbs heavier you can use Bumex  Extra dose  but only use 2 days at a time if needed    If you weight is stable  and your breathing is labored then use inhaler instead of increasing BUmex    *If you need a refill on your cardiac medications before your next appointment, please call your pharmacy*   Lab Work: Not needed   Testing/Procedures:  Not needed  Follow-Up: At [facility], you and your health needs are our priority.  As part of our continuing mission to provide you with exceptional heart care, we have created designated Provider Care Teams.  These Care Teams include your primary Cardiologist (physician) and Advanced Practice Providers (APPs -  Physician Assistants and Nurse Practitioners) who all work together to provide you with the care you need, when you need it.     Your next appointment:   2 month(s)  The format for your next appointment:   In Person  Provider:   Glenetta Hew, MD    Other Instructions    Okay to see your allergy doctor  Okay to [facility]

## 2022-01-04 ENCOUNTER — Encounter: Payer: Self-pay | Admitting: Cardiology

## 2022-01-04 NOTE — Assessment & Plan Note (Signed)
Seen by nephrology.  I think the problem clearly was overdiuresis it where it probably was not CHF more so respiratory issues that led to her first ER stay.

## 2022-01-04 NOTE — Assessment & Plan Note (Addendum)
She seems pretty euvolemic on exam and I think she has a hard time understanding there is doing CHF exacerbation and OSA/OHS/COPD exacerbation.  We talked about monitoring her weights.  If she is not up from her baseline dry weight which should be basically her discharge weight.  That she probably is not having CHF exacerbation and should not necessarily take extra Bumex.  We did discuss sliding scale Bumex.-More than 3 pounds take extra dose.  More than 5 pounds, take extra dose x2 days or until weight back to baseline.  I would not want her to go more than 2 days of double dose before taking a break off.  Plan: For now continue with Lopressor, but low threshold to convert to Toprol. Also on lisinopril with low threshold to convert to ARB to avoid potential cough exacerbation.  (With borderline pressures, would not have room for Entresto) Continue Jardiance, but probably does need standing dose of Bumex. Continue Bumex 1 mg daily, but if she were to drop weight below her baseline, would be okay to hold for a day or so.  Also discussed sliding scale additional dosing.  As discussed  monitor your weight daily - use a daily weight if you are 3 lbs heavier you can use Bumex  Extra dose  but only use 2 days at a time if needed    If you weight is stable  and your breathing is labored then use inhaler instead of increasing BUmex

## 2022-01-04 NOTE — Assessment & Plan Note (Signed)
She is on insulin along with Jardiance and Ozempic.  Defer to nephrology and endocrinology.

## 2022-01-04 NOTE — Assessment & Plan Note (Signed)
The patient understands the need to lose weight with diet and exercise. We have discussed specific strategies for this. We talked about the importance of adjusting diet but hopefully with being back on Ozempic she will start to lose weight somewhat.

## 2022-01-04 NOTE — Assessment & Plan Note (Signed)
Well-controlled BP on current meds.  No room to titrate further.  No room to convert from ACE inhibitor to [place].

## 2022-01-04 NOTE — Assessment & Plan Note (Signed)
I think this is a major concern for her is that she has a difficulty indicating whether it is COPD or CHF exacerbation.  We did talk about using weight and swelling as a marker.  If these are all okay, that is probably not cardiac in nature and she probably should not take extra extra Bumex more than maybe 1 day to see.  On those occasions, would probably best to use her nebulizers.  Certainly, it is important to take care of her seasonal allergies with only [place] exacerbate these problems.

## 2022-01-04 NOTE — Assessment & Plan Note (Signed)
Remains on rosuvastatin 40 mg daily.  Should be due for labs checked by PCP soon but was well within goal as of December 2022.  Hopefully with continued weight loss this will also improve.  See separate section for diabetes.

## 2022-01-04 NOTE — Assessment & Plan Note (Signed)
No real active anginal symptoms.  She has been doing well since her PCI in 2013.  Last cardiac cath was in 2019 showing patent stents with only some mild proximal ISR in the RCA.  Last Myoview was May 2021 with no ischemia. Overall on a pretty stable regimen.  Plan:  On Lopressor 50 mg twice daily She is on 60 of Imdur along with 20 of lisinopril and rosuvastatin 40 mg. She is on both [place]. Continue with Plavix monotherapy with history of both PAD and CAD.  Could consider low-dose Xarelto plus aspirin, but for now continue with clopidogrel.

## 2022-01-06 ENCOUNTER — Other Ambulatory Visit: Payer: Self-pay | Admitting: Family

## 2022-01-06 ENCOUNTER — Other Ambulatory Visit: Payer: Self-pay | Admitting: Family Medicine

## 2022-01-06 NOTE — Telephone Encounter (Signed)
Called to inform patient that her refill has been sent and confirm her appointment for next month with Althea Charon.

## 2022-01-08 ENCOUNTER — Telehealth: Payer: Self-pay

## 2022-01-08 ENCOUNTER — Other Ambulatory Visit: Payer: Self-pay | Admitting: Cardiology

## 2022-01-08 DIAGNOSIS — I5032 Chronic diastolic (congestive) heart failure: Secondary | ICD-10-CM

## 2022-01-08 NOTE — Telephone Encounter (Signed)
The pt has called and she stated that she wanted to change the provider to Dr. Etter Sjogren. She has canceled the CPE and rescheduled it with Dr. Etter Sjogren. Pt reported that the pt has talk to Dr. Etter Sjogren about this as well.   Please confirm that is okay with both providers. I have informed the pt that its not usually something that is done but I will route the message to both providers.

## 2022-01-09 ENCOUNTER — Telehealth: Payer: Self-pay

## 2022-01-09 DIAGNOSIS — M9903 Segmental and somatic dysfunction of lumbar region: Secondary | ICD-10-CM | POA: Diagnosis not present

## 2022-01-09 DIAGNOSIS — M9902 Segmental and somatic dysfunction of thoracic region: Secondary | ICD-10-CM | POA: Diagnosis not present

## 2022-01-09 NOTE — Telephone Encounter (Signed)
It says you changed therapy. Does this need to be denied or filled?

## 2022-01-09 NOTE — Telephone Encounter (Signed)
Albutetol hfa change to levalbuterol hfa fax'd to [employer] [phone].

## 2022-01-13 NOTE — Telephone Encounter (Signed)
Katrina Vega patient is requesting [place] to change her from albuterol hfa to levalbuterol hfa is this ok to do?

## 2022-01-14 NOTE — Telephone Encounter (Signed)
That is fine to do. I thought that I filled out a form for this already

## 2022-01-15 NOTE — Telephone Encounter (Signed)
Left patient a message that the levalbuterol hfa has been sent out through the postal office. The tracking number is [id number]. I will send this message through her mychart.

## 2022-01-24 ENCOUNTER — Encounter: Payer: Self-pay | Admitting: Family

## 2022-01-24 ENCOUNTER — Inpatient Hospital Stay (HOSPITAL_BASED_OUTPATIENT_CLINIC_OR_DEPARTMENT_OTHER): Payer: Medicare Other | Admitting: Family

## 2022-01-24 ENCOUNTER — Inpatient Hospital Stay: Payer: Medicare Other | Attending: Hematology & Oncology

## 2022-01-24 VITALS — BP 123/58 | HR 83 | Temp 97.9°F | Resp 18 | Ht 65.0 in | Wt 251.0 lb

## 2022-01-24 DIAGNOSIS — I509 Heart failure, unspecified: Secondary | ICD-10-CM | POA: Diagnosis not present

## 2022-01-24 DIAGNOSIS — D509 Iron deficiency anemia, unspecified: Secondary | ICD-10-CM

## 2022-01-24 DIAGNOSIS — K219 Gastro-esophageal reflux disease without esophagitis: Secondary | ICD-10-CM | POA: Diagnosis not present

## 2022-01-24 LAB — CBC WITH DIFFERENTIAL (CANCER CENTER ONLY)
Abs Immature Granulocytes: 0.06 10*3/uL (ref 0.00–0.07)
Basophils Absolute: 0 10*3/uL (ref 0.0–0.1)
Basophils Relative: 1 %
Eosinophils Absolute: 0.1 10*3/uL (ref 0.0–0.5)
Eosinophils Relative: 2 %
HCT: 40.3 % (ref 36.0–46.0)
Hemoglobin: 12.3 g/dL (ref 12.0–15.0)
Immature Granulocytes: 1 %
Lymphocytes Relative: 40 %
Lymphs Abs: 2.2 10*3/uL (ref 0.7–4.0)
MCH: 25.9 pg — ABNORMAL LOW (ref 26.0–34.0)
MCHC: 30.5 g/dL (ref 30.0–36.0)
MCV: 85 fL (ref 80.0–100.0)
Monocytes Absolute: 0.4 10*3/uL (ref 0.1–1.0)
Monocytes Relative: 8 %
Neutro Abs: 2.8 10*3/uL (ref 1.7–7.7)
Neutrophils Relative %: 48 %
Platelet Count: 237 10*3/uL (ref 150–400)
RBC: 4.74 MIL/uL (ref 3.87–5.11)
RDW: 15.8 % — ABNORMAL HIGH (ref 11.5–15.5)
WBC Count: 5.6 10*3/uL (ref 4.0–10.5)
nRBC: 0 % (ref 0.0–0.2)

## 2022-01-24 LAB — RETICULOCYTES
Immature Retic Fract: 21.5 % — ABNORMAL HIGH (ref 2.3–15.9)
RBC.: 4.78 MIL/uL (ref 3.87–5.11)
Retic Count, Absolute: 102.3 10*3/uL (ref 19.0–186.0)
Retic Ct Pct: 2.1 % (ref 0.4–3.1)

## 2022-01-24 LAB — IRON AND IRON BINDING CAPACITY (CC-WL,HP ONLY)
Iron: 59 ug/dL (ref 28–170)
Saturation Ratios: 13 % (ref 10.4–31.8)
TIBC: 442 ug/dL (ref 250–450)
UIBC: 383 ug/dL (ref 148–442)

## 2022-01-24 LAB — FERRITIN: Ferritin: 16 ng/mL (ref 11–307)

## 2022-01-24 NOTE — Progress Notes (Signed)
Hematology and Oncology Follow Up Visit  Katrina Vega [id number] Mar 31, 1955 66 y.o. 01/24/2022   Principle Diagnosis:  Iron deficiency anemia    Current Therapy:        IV iron as indicated   Interim History:  Katrina Vega is here today for follow-up. She was hospitalized in late October for congestive heart failure. Thankfully she is back home and recuperating nicely. She is followed closely by her cardiologist Dr. Ellyn Hack.  She is weighing herself daily and adjusts her diuretic as directed.  She has appointments next week on Monday with her allergist and her gastroenterologist. She has SOB with exertion. She feels that this was recently exacerbated while staying with her sister who is a smoker.  She also has GERD. She notes trouble with feeling like her food gets stuck in her chest after she swallows.  She has not noted any blood loss. No abnormal bruising, no petechiae.  No fever, chills, n/v, cough, rash, dizziness, chest pain, palpitations, abdominal pain or changes in bowel or bladder habits.  No swelling in her extremities noted at this time. Pedal pulses are 1+.  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  No falls or syncope reported.  Appetite and hydration are good. Weight is stable at 251 lbs.   ECOG Performance Status: 1 - Symptomatic but completely ambulatory  Medications:  Allergies as of 01/24/2022       Reactions   Contrast Media [iodinated Contrast Media] Shortness Of Breath, Other (See Comments)   sob, chest tight   Morphine Itching   Statins Diarrhea, Other (See Comments)   nausea, diarrhea, myalgias   Ioversol         Medication List        Accurate as of January 24, 2022 11:38 AM. If you have any questions, ask your nurse or doctor.          albuterol (2.5 MG/3ML) 0.083% nebulizer solution Commonly known as: PROVENTIL Take 3 mLs (2.5 mg total) by nebulization every 6 (six) hours as needed for wheezing or shortness of breath.    albuterol 108 (90 Base) MCG/ACT inhaler Commonly known as: VENTOLIN HFA Inhale 2 puffs into the lungs every 6 (six) hours as needed for wheezing.   BD AutoShield Duo 30G X 5 MM Misc Generic drug: Insulin Pen Needle 1 KIT BY MISC ROUTE 3 TIMES DAILY BEFORE MEALS   benzonatate 200 MG capsule Commonly known as: TESSALON Take 1 capsule (200 mg total) by mouth 2 (two) times daily as needed for cough.   Breztri Aerosphere 160-9-4.8 MCG/ACT Aero Generic drug: Budeson-Glycopyrrol-Formoterol Inhale two puffs twice daily to prevent cough or wheeze.  Rinse, gargle, and spit after use.   bumetanide 1 MG tablet Commonly known as: Bumex Take 1 tablet (1 mg total) by mouth daily.   CareTouch 2 CPAP Hose Hanger Misc by Does not apply route.   cetirizine 10 MG tablet Commonly known as: ZYRTEC Take 1 tablet by mouth daily.   Cholecalciferol 50 MCG (2000 UT) Tabs Take 2,000 Units by mouth daily.   clopidogrel 75 MG tablet Commonly known as: PLAVIX Take 1 tablet (75 mg total) by mouth daily.   cromolyn 4 % ophthalmic solution Commonly known as: OPTICROM Place 1 drop into both eyes 4 (four) times daily. INSTILL 1 DROP INTO BOTH EYES UP TO 4 TIMES DAILY AS NEEDED FOR  RED, ITCHY, OR WATERY EYES   Dexcom G6 Sensor Misc USE 1 SENSOR AS NEEDED CHANGE EVERY 10 DAYS  Dexcom G6 Transmitter Misc   empagliflozin 10 MG Tabs tablet Commonly known as: Jardiance Take 1 tablet (10 mg total) by mouth daily before breakfast.   EPINEPHrine 0.3 mg/0.3 mL Soaj injection Commonly known as: EpiPen 2-Pak Use as directed for severe allergic reactions   gabapentin 300 MG capsule Commonly known as: NEURONTIN TAKE 1 CAPSULE BY MOUTH 3 TIMES  DAILY   glucagon 1 MG injection Inject 1 mg into the muscle once as needed (low blood sugar).   glucose 4 GM chewable tablet Chew 1 tablet by mouth once as needed for low blood sugar.   HumaLOG 100 UNIT/ML injection Generic drug: insulin lispro Inject into the  skin.   HumuLIN N 100 UNIT/ML injection Generic drug: insulin NPH Human Inject 60 Units into the skin 3 (three) times daily before meals.   isosorbide mononitrate 60 MG 24 hr tablet Commonly known as: IMDUR Take 1 tablet (60 mg total) by mouth daily.   levocetirizine 5 MG tablet Commonly known as: XYZAL TAKE 1 TABLET BY MOUTH EVERY  EVENING IF NEEDED   lisinopril 20 MG tablet Commonly known as: ZESTRIL Take 1 tablet (20 mg total) by mouth daily.   methocarbamol 500 MG tablet Commonly known as: ROBAXIN Take 1 tablet (500 mg total) by mouth every 8 (eight) hours as needed for muscle spasms.   metoprolol tartrate 50 MG tablet Commonly known as: LOPRESSOR Take 1 tablet (50 mg total) by mouth every 12 (twelve) hours.   montelukast 10 MG tablet Commonly known as: SINGULAIR TAKE 1 TABLET(10 MG) BY MOUTH AT BEDTIME What changed:  how much to take how to take this when to take this additional instructions   nitroGLYCERIN 0.4 MG SL tablet Commonly known as: NITROSTAT Place 1 tablet (0.4 mg total) under the tongue every 5 (five) minutes as needed for chest pain.   Omnipod 5 G6 Intro (Gen 5) Kit CHANGE POD EVERY 2 DAYS   Ozempic (2 MG/DOSE) 8 MG/3ML Sopn Generic drug: Semaglutide (2 MG/DOSE) Inject 2 mg into the skin once a week.   pantoprazole 40 MG tablet Commonly known as: PROTONIX TAKE 1 TABLET BY MOUTH IN THE  MORNING AND AT BEDTIME What changed: See the new instructions.   potassium chloride 10 MEQ tablet Commonly known as: KLOR-CON Take 1 tablet (10 mEq total) by mouth daily.   rosuvastatin 40 MG tablet Commonly known as: CRESTOR Take 1 tablet (40 mg total) by mouth at bedtime.        Allergies:  Allergies  Allergen Reactions   Contrast Media [Iodinated Contrast Media] Shortness Of Breath and Other (See Comments)    sob, chest tight   Morphine Itching   Statins Diarrhea and Other (See Comments)    nausea, diarrhea, myalgias   Ioversol     Past  Medical History, Surgical history, Social history, and Family History were reviewed and updated.  Review of Systems: All other 10 point review of systems is negative.   Physical Exam:  height is _0  (1.651 m) and weight is 251 lb (113.9 kg). Her oral temperature is 97.9 F (36.6 C). Her blood pressure is 123/58 (abnormal) and her pulse is 83. Her respiration is 18 and oxygen saturation is 100%.   Wt Readings from Last 3 Encounters:  01/24/22 251 lb (113.9 kg)  01/03/22 247 lb 9.6 oz (112.3 kg)  12/21/21 256 lb 2.8 oz (116.2 kg)    Ocular: Sclerae unicteric, pupils equal, round and reactive to light Ear-nose-throat: Oropharynx clear, dentition fair  Lymphatic: No cervical or supraclavicular adenopathy Lungs no rales or rhonchi, good excursion bilaterally Heart regular rate and rhythm, no murmur appreciated Abd soft, nontender, positive bowel sounds MSK no focal spinal tenderness, no joint edema Neuro: non-focal, well-oriented, appropriate affect Breasts: Deferred   Lab Results  Component Value Date   WBC 5.6 01/24/2022   HGB 12.3 01/24/2022   HCT 40.3 01/24/2022   MCV 85.0 01/24/2022   PLT 237 01/24/2022   Lab Results  Component Value Date   FERRITIN 16 09/24/2021   IRON 71 09/24/2021   TIBC 451 (H) 09/24/2021   UIBC 380 09/24/2021   IRONPCTSAT 16 09/24/2021   Lab Results  Component Value Date   RETICCTPCT 2.1 01/24/2022   RBC 4.74 01/24/2022   No results found for: "KPAFRELGTCHN", "LAMBDASER", "KAPLAMBRATIO" No results found for: "IGGSERUM", "IGA", "IGMSERUM" No results found for: "TOTALPROTELP", "ALBUMINELP", "A1GS", "A2GS", "BETS", "BETA2SER", "GAMS", "MSPIKE", "SPEI"   Chemistry      Component Value Date/Time   NA 137 12/22/2021 0019   K 3.7 12/22/2021 0019   CL 107 12/22/2021 0019   CO2 22 12/22/2021 0019   BUN 14 12/22/2021 0019   CREATININE 1.67 (H) 12/22/2021 0019   CREATININE 0.99 02/12/2021 0849      Component Value Date/Time   CALCIUM 9.5  12/22/2021 0019   ALKPHOS 62 12/17/2021 1107   AST 15 12/17/2021 1107   AST 13 (L) 02/12/2021 0849   ALT 12 12/17/2021 1107   ALT 13 02/12/2021 0849   BILITOT 0.5 12/17/2021 1107   BILITOT 0.5 02/12/2021 0849       Impression and Plan: Ms. Dancer is a very pleasant 66 yo African American female with long history of iron deficiency anemia.  Iron studies are pending.  Follow-up in 4 months.   Lottie Dawson, NP 12/1/202311:38 AM

## 2022-01-25 NOTE — Patient Instructions (Incomplete)
Asthma Continue montelukast 10 mg once a day to prevent cough or wheeze.  We will send in a refill Continue Breztri 2 puffs twice a day with a spacer to prevent cough or wheeze Continue albuterol 2 puffs every 4 hours as needed for cough or wheeze OR Instead use albuterol 0.083% solution via nebulizer one unit vial every 4 hours as needed for cough or wheeze Continue to avoid cigarette smoke I will send a message to Tammy, our biologics coordinator, about getting Berna Bue approved. She will be in contact with you Since your cough has only been going on for 2-3 days we will hold off on thinking about changing your Lisinopril, but if this continues to be a problem we will have you contact your cardiologist Asthma control goals:  Full participation in all desired activities (may need albuterol before activity) Albuterol use two time or less a week on average (not counting use with activity) Cough interfering with sleep two time or less a month Oral steroids no more than once a year No hospitalizations   Allergic rhinitis Continue allergen avoidance measures directed toward dust mite, cockroach, weed pollen, cat, and dog Hold off on getting allergy injections until we get your breathing under better control Continue Nasacort 1 to 2 sprays in each nostril once a day as needed for nasal congestion. Continue  azelastine 2 sprays in each nostril up to twice a day as needed for runny nose/drainage down throat Consider saline nasal rinses as needed for nasal symptoms. Use this before any medicated nasal sprays for best result. Increase saline rinses to twice a day for now Continue an antihistamine once a day as needed for runny nose or itch. Remember to rotate to a different antihistamine about every 3 months. Some examples of over the counter antihistamines include Zyrtec (cetirizine), Xyzal (levocetirizine), Allegra (fexofenadine), and Claritin (loratidine).  Since your yellow rhinorrhea has been for  only 2-3 days I will hold off on giving you an antibiotic  Allergic conjunctivitis Continue cromolyn 4% eyedrops.  Use 1 drop in each eye up to 4 times a day as needed for red, itchy, or watery eyes. May try over the counter Pataday or Zaditor eye drops to see if they do not sting/burn with use  Reflux Continue dietary and lifestyle modifications as listed below Continue pantoprazole twice a day  as previously prescribed Keep your GI appointment later on today I will refer you to ENT due to the sensation of something being" stuck in your throat."   Follow up in 3 months or sooner if needed.   Lifestyle Changes for Controlling GERD When you have GERD, stomach acid feels as if it's backing up toward your mouth. Whether or not you take medication to control your GERD, your symptoms can often be improved with lifestyle changes.   Raise Your Head Reflux is more likely to strike when you're lying down flat, because stomach fluid can flow backward more easily. Raising the head of your bed 4-6 inches can help. To do this: Slide blocks or books under the legs at the head of your bed. Or, place a wedge under the mattress. Many foam stores can make a suitable wedge for you. The wedge should run from your waist to the top of your head. Don't just prop your head on several pillows. This increases pressure on your stomach. It can make GERD worse.  Watch Your Eating Habits Certain foods may increase the acid in your stomach or relax the lower esophageal sphincter,  making GERD more likely. It's best to avoid the following: Coffee, tea, and carbonated drinks (with and without caffeine) Fatty, fried, or spicy food Mint, chocolate, onions, and tomatoes Any other foods that seem to irritate your stomach or cause you pain  Relieve the Pressure Eat smaller meals, even if you have to eat more often. Don't lie down right after you eat. Wait a few hours for your stomach to empty. Avoid tight belts  and tight-fitting clothes. Lose excess weight.  Tobacco and Alcohol Avoid smoking tobacco and drinking alcohol. They can make GERD symptoms worse.  Reducing Pollen Exposure The American Academy of Allergy, Asthma and Immunology suggests the following steps to reduce your exposure to pollen during allergy seasons. Do not hang sheets or clothing out to dry; pollen may collect on these items. Do not mow lawns or spend time around freshly cut grass; mowing stirs up pollen. Keep windows closed at night.  Keep car windows closed while driving. Minimize morning activities outdoors, a time when pollen counts are usually at their highest. Stay indoors as much as possible when pollen counts or humidity is high and on windy days when pollen tends to remain in the air longer. Use air conditioning when possible.  Many air conditioners have filters that trap the pollen spores. Use a HEPA room air filter to remove pollen form the indoor air you breathe.   Control of Dust Mite Allergen Dust mites play a major role in allergic asthma and rhinitis. They occur in environments with high humidity wherever human skin is found. Dust mites absorb humidity from the atmosphere (ie, they do not drink) and feed on organic matter (including shed human and animal skin). Dust mites are a microscopic type of insect that you cannot see with the naked eye. High levels of dust mites have been detected from mattresses, pillows, carpets, upholstered furniture, bed covers, clothes, soft toys and any woven material. The principal allergen of the dust mite is found in its feces. A gram of dust may contain 1,000 mites and 250,000 fecal particles. Mite antigen is easily measured in the air during house cleaning activities. Dust mites do not bite and do not cause harm to humans, other than by triggering allergies/asthma.  Ways to decrease your exposure to dust mites in your home:  1. Encase mattresses, box springs and pillows with a  mite-impermeable barrier or cover  2. Wash sheets, blankets and drapes weekly in hot water (130 F) with detergent and dry them in a dryer on the hot setting.  3. Have the room cleaned frequently with a vacuum cleaner and a damp dust-mop. For carpeting or rugs, vacuuming with a vacuum cleaner equipped with a high-efficiency particulate air (HEPA) filter. The dust mite allergic individual should not be in a room which is being cleaned and should wait 1 hour after cleaning before going into the room.  4. Do not sleep on upholstered furniture (eg, couches).  5. If possible removing carpeting, upholstered furniture and drapery from the home is ideal. Horizontal blinds should be eliminated in the rooms where the person spends the most time (bedroom, study, television room). Washable vinyl, roller-type shades are optimal.  6. Remove all non-washable stuffed toys from the bedroom. Wash stuffed toys weekly like sheets and blankets above.  7. Reduce indoor humidity to less than 50%. Inexpensive humidity monitors can be purchased at most hardware stores. Do not use a humidifier as can make the problem worse and are not recommended.  Control of Dog  or Cat Allergen Avoidance is the best way to manage a dog or cat allergy. If you have a dog or cat and are allergic to dog or cats, consider removing the dog or cat from the home. If you have a dog or cat but don't want to find it a new home, or if your family wants a pet even though someone in the household is allergic, here are some strategies that may help keep symptoms at bay:  Keep the pet out of your bedroom and restrict it to only a few rooms. Be advised that keeping the dog or cat in only one room will not limit the allergens to that room. Don't pet, hug or kiss the dog or cat; if you do, wash your hands with soap and water. High-efficiency particulate air (HEPA) cleaners run continuously in a bedroom or living room can reduce allergen levels over  time. Regular use of a high-efficiency vacuum cleaner or a central vacuum can reduce allergen levels. Giving your dog or cat a bath at least once a week can reduce airborne allergen.  Control of Cockroach Allergen Cockroach allergen has been identified as an important cause of acute attacks of asthma, especially in urban settings.  There are fifty-five species of cockroach that exist in the [place], however only three, the [place], [place] species produce allergen that can affect patients with Asthma.  Allergens can be obtained from fecal particles, egg casings and secretions from cockroaches.    Remove food sources. Reduce access to water. Seal access and entry points. Spray runways with 0.5-1% Diazinon or Chlorpyrifos Blow boric acid power under stoves and refrigerator. Place bait stations (hydramethylnon) at feeding sites.

## 2022-01-27 ENCOUNTER — Encounter: Payer: Self-pay | Admitting: Family

## 2022-01-27 ENCOUNTER — Ambulatory Visit (INDEPENDENT_AMBULATORY_CARE_PROVIDER_SITE_OTHER): Payer: Medicare Other | Admitting: Family

## 2022-01-27 ENCOUNTER — Other Ambulatory Visit: Payer: Self-pay

## 2022-01-27 VITALS — BP 110/70 | HR 84 | Temp 98.2°F | Resp 16 | Wt 249.9 lb

## 2022-01-27 DIAGNOSIS — K219 Gastro-esophageal reflux disease without esophagitis: Secondary | ICD-10-CM

## 2022-01-27 DIAGNOSIS — J302 Other seasonal allergic rhinitis: Secondary | ICD-10-CM | POA: Diagnosis not present

## 2022-01-27 DIAGNOSIS — R09A2 Foreign body sensation, throat: Secondary | ICD-10-CM | POA: Diagnosis not present

## 2022-01-27 DIAGNOSIS — H1013 Acute atopic conjunctivitis, bilateral: Secondary | ICD-10-CM

## 2022-01-27 DIAGNOSIS — H1010 Acute atopic conjunctivitis, unspecified eye: Secondary | ICD-10-CM

## 2022-01-27 DIAGNOSIS — J4540 Moderate persistent asthma, uncomplicated: Secondary | ICD-10-CM | POA: Diagnosis not present

## 2022-01-27 DIAGNOSIS — E119 Type 2 diabetes mellitus without complications: Secondary | ICD-10-CM | POA: Diagnosis not present

## 2022-01-27 DIAGNOSIS — R1310 Dysphagia, unspecified: Secondary | ICD-10-CM | POA: Diagnosis not present

## 2022-01-27 NOTE — Progress Notes (Signed)
[place] [address] Dept: [phone]  FOLLOW UP NOTE  Patient ID: Katrina Vega, female    DOB: 1955/11/08  Age: 66 y.o. MRN: [id number] Date of Office Visit: 01/27/2022  Assessment  Chief Complaint: Follow-up (Tighness in chest some times. Feels like something is stuck in her throat after visiting her sister (who smokes) in [place]. Yellow mucous. Patient thinks that she might have a sinus infection.)  HPI Katrina Vega is a 66 year old female who presents today for follow-up of asthma-COPD overlap syndrome, seasonal and perennial allergic rhinitis, allergic conjunctivitis, gastroesophageal reflux disease.  She was last seen on December 16, 2021 by myself.  She denies any new diagnosis or surgery since her last office visit.  She is now seeing a new cardiologist.  She was admitted to the hospital on December 19, 2021 due to dyspnea.  Please see assessment and plan below:  "Assessment and Plan:   Atypical chest pain -Resolved -Unclear etiology, troponin x2 are unremarkable -Patient has chest wall tenderness to palpation in sternum -D-dimer was elevated at 4.58, venous duplex of lower extremities is negative, VQ scan is negative -She was started on full dose anticoagulation with IV heparin which was discontinued  -She is currently not requiring oxygen -She does have history of GERD, patient takes Protonix and Maalox at home -Improved after starting Maalox, Robaxin -We will discharge on Robaxin as needed for pain     Acute kidney injury on CKD stage III a -BUN/creatinine 25/2.21 -Last creatinine was 1.14 on 10/16 -Likely from overdiuresis -Creatinine has improved to 1.66 -Will resume Bumex 1 mg daily at home     Chronic diastolic heart failure -Last echocardiogram from 12/16/2021 showed EF 59%, with grade 1 diastolic dysfunction -BNP was 8.7 -Bumex resumed as above   Hypercalcemia -Calcium was elevated 10.7 -Repeat calcium this morning is 9.1    Asthma/COPD -Not in exacerbation -Continue as needed butyryl   Hypertension -Continue home regimen   Asthma: She continues to take Breztri 2 puffs twice a day with spacer, montelukast 10 mg once a day, and albuterol as needed.  She reports a nonproductive cough for the past 2 to 3 days.  The cough is worse at night.  She has a sensation of feeling phlegm in her throat that she cannot get up.  She also has tightness in her chest, nocturnal awakenings sometimes due to breathing problems.  She also has a pain in the center of her chest that has been going on for the past couple weeks.  She wonders if being around her sister who smokes could be the cause.  Albuterol does help this a little.  She denies shortness of breath, wheezing, fever, and chills.  She is interested in starting Fasenra injections.  She is currently using her albuterol inhaler 2-3 times a day.  Allergic rhinitis.  She reports rhinorrhea has been yellow for the past 2 to 3 days, nasal congestion, and postnasal drip.  She still continues to hold off on getting her allergy injections due to her breathing symptoms.  She continues to take Nasacort nasal spray daily, azelastine nasal spray 2 times a day, Zyrtec once a day, and montelukast 10 mg once a day.  She does feel like azelastine nasal spray works pretty good.  She is interested in restarting her allergy injections once her asthma gets under better control.  Allergic conjunctivitis: She reports itchy watery eyes.  She notices that cromolyn 4% eyedrops causes her eyes to burn.  She does have some Pataday that is not expired and she will see if this does not burn.  She does not think she has tried Zaditor eyedrops.  Reflux is reported as being horrible.  She continues to take pantoprazole twice a day.  She does see GI later on today.  She reports a history of a hiatal hernia.  Here lately she will drink water and get full and will not be able to eat or if she eats first she will not have  room to drink.  Her chest will also burn.  She feels like something phlegm is stuck in her throat.  She is not sure how long this is going on.  She does mention though that the phlegm does feel like it blocks food.  She is interested in a referral to ear nose and throat for better visualization.     Drug Allergies:  Allergies  Allergen Reactions   Contrast Media [Iodinated Contrast Media] Shortness Of Breath and Other (See Comments)    sob, chest tight   Morphine Itching   Statins Diarrhea and Other (See Comments)    nausea, diarrhea, myalgias   Ioversol     Review of Systems: Review of Systems  Constitutional:  Negative for chills and fever.  HENT:         Reports yellow rhinorrhea for the past 2 to 3 days, nasal congestion, and postnasal drip  Eyes:        Reports itchy watery eyes.  Respiratory:  Positive for cough. Negative for shortness of breath and wheezing.        Reports dry cough for the past 2 to 3 days and tightness in her chest.  Does report nocturnal awakenings sometimes due to breathing problems.  Denies shortness of breath and wheezing.  Gastrointestinal:        Reports reflux is horrible.  Reports feeling something like phlegm stuck in her throat.  Genitourinary:  Negative for frequency.  Skin:  Negative for itching and rash.  Neurological:  Negative for headaches.  Endo/Heme/Allergies:  Positive for environmental allergies.     Physical Exam: BP 110/70   Pulse 84   Temp 98.2 F (36.8 C)   Resp 16   Wt 249 lb 14.4 oz (113.4 kg)   SpO2 95%   BMI 41.59 kg/m    Physical Exam Constitutional:      Appearance: Normal appearance.  HENT:     Head: Normocephalic and atraumatic.     Comments: Pharynx normal, eyes normal, ears normal, nose: Bilateral lower turbinates moderately edematous and slightly erythematous with no drainage noted    Right Ear: Tympanic membrane, ear canal and external ear normal.     Left Ear: Tympanic membrane, ear canal and external  ear normal.     Mouth/Throat:     Mouth: Mucous membranes are moist.     Pharynx: Oropharynx is clear.  Eyes:     Conjunctiva/sclera: Conjunctivae normal.  Cardiovascular:     Rate and Rhythm: Normal rate and regular rhythm.     Heart sounds: Normal heart sounds.  Pulmonary:     Effort: Pulmonary effort is normal.     Breath sounds: Normal breath sounds.     Comments: Lungs clear to auscultation Musculoskeletal:     Cervical back: Neck supple.  Skin:    General: Skin is warm.  Neurological:     Mental Status: She is alert and oriented to person, place, and time.  Psychiatric:  Mood and Affect: Mood normal.        Behavior: Behavior normal.        Thought Content: Thought content normal.        Judgment: Judgment normal.     Diagnostics: FVC 1.52 L (59%), FEV1 1.33 L (66%).  Predicted FVC 2.58 L, predicted FEV1 2.02 L.  FEV1/FVC ratio 0.88.  Spirometry indicates possible moderate restriction.  Assessment and Plan: 1. Not well controlled moderate persistent asthma   2. Seasonal and perennial allergic rhinoconjunctivitis   3. Gastroesophageal reflux disease, unspecified whether esophagitis present   4. Allergic conjunctivitis of both eyes   5. Globus sensation     No orders of the defined types were placed in this encounter.   Patient Instructions  Asthma Continue montelukast 10 mg once a day to prevent cough or wheeze.  We will send in a refill Continue Breztri 2 puffs twice a day with a spacer to prevent cough or wheeze Continue albuterol 2 puffs every 4 hours as needed for cough or wheeze OR Instead use albuterol 0.083% solution via nebulizer one unit vial every 4 hours as needed for cough or wheeze Continue to avoid cigarette smoke I will send a message to Tammy, our biologics coordinator, about getting Berna Bue approved. She will be in contact with you Since your cough has only been going on for 2-3 days we will hold off on thinking about changing your  Lisinopril, but if this continues to be a problem we will have you contact your cardiologist Asthma control goals:  Full participation in all desired activities (may need albuterol before activity) Albuterol use two time or less a week on average (not counting use with activity) Cough interfering with sleep two time or less a month Oral steroids no more than once a year No hospitalizations   Allergic rhinitis Continue allergen avoidance measures directed toward dust mite, cockroach, weed pollen, cat, and dog Hold off on getting allergy injections until we get your breathing under better control Continue Nasacort 1 to 2 sprays in each nostril once a day as needed for nasal congestion. Continue  azelastine 2 sprays in each nostril up to twice a day as needed for runny nose/drainage down throat Consider saline nasal rinses as needed for nasal symptoms. Use this before any medicated nasal sprays for best result. Increase saline rinses to twice a day for now Continue an antihistamine once a day as needed for runny nose or itch. Remember to rotate to a different antihistamine about every 3 months. Some examples of over the counter antihistamines include Zyrtec (cetirizine), Xyzal (levocetirizine), Allegra (fexofenadine), and Claritin (loratidine).  Since your yellow rhinorrhea has been for only 2-3 days I will hold off on giving you an antibiotic  Allergic conjunctivitis Continue cromolyn 4% eyedrops.  Use 1 drop in each eye up to 4 times a day as needed for red, itchy, or watery eyes. May try over the counter Pataday or Zaditor eye drops to see if they do not sting/burn with use  Reflux Continue dietary and lifestyle modifications as listed below Continue pantoprazole twice a day  as previously prescribed Keep your GI appointment later on today I will refer you to ENT due to the sensation of something being" stuck in your throat."   Follow up in 3 months or sooner if needed.   Lifestyle  Changes for Controlling GERD When you have GERD, stomach acid feels as if it's backing up toward your mouth. Whether or not you take medication  to control your GERD, your symptoms can often be improved with lifestyle changes.   Raise Your Head Reflux is more likely to strike when you're lying down flat, because stomach fluid can flow backward more easily. Raising the head of your bed 4-6 inches can help. To do this: Slide blocks or books under the legs at the head of your bed. Or, place a wedge under the mattress. Many foam stores can make a suitable wedge for you. The wedge should run from your waist to the top of your head. Don't just prop your head on several pillows. This increases pressure on your stomach. It can make GERD worse.  Watch Your Eating Habits Certain foods may increase the acid in your stomach or relax the lower esophageal sphincter, making GERD more likely. It's best to avoid the following: Coffee, tea, and carbonated drinks (with and without caffeine) Fatty, fried, or spicy food Mint, chocolate, onions, and tomatoes Any other foods that seem to irritate your stomach or cause you pain  Relieve the Pressure Eat smaller meals, even if you have to eat more often. Don't lie down right after you eat. Wait a few hours for your stomach to empty. Avoid tight belts and tight-fitting clothes. Lose excess weight.  Tobacco and Alcohol Avoid smoking tobacco and drinking alcohol. They can make GERD symptoms worse.  Reducing Pollen Exposure The American Academy of Allergy, Asthma and Immunology suggests the following steps to reduce your exposure to pollen during allergy seasons. Do not hang sheets or clothing out to dry; pollen may collect on these items. Do not mow lawns or spend time around freshly cut grass; mowing stirs up pollen. Keep windows closed at night.  Keep car windows closed while driving. Minimize morning activities outdoors, a time when pollen counts are  usually at their highest. Stay indoors as much as possible when pollen counts or humidity is high and on windy days when pollen tends to remain in the air longer. Use air conditioning when possible.  Many air conditioners have filters that trap the pollen spores. Use a HEPA room air filter to remove pollen form the indoor air you breathe.   Control of Dust Mite Allergen Dust mites play a major role in allergic asthma and rhinitis. They occur in environments with high humidity wherever human skin is found. Dust mites absorb humidity from the atmosphere (ie, they do not drink) and feed on organic matter (including shed human and animal skin). Dust mites are a microscopic type of insect that you cannot see with the naked eye. High levels of dust mites have been detected from mattresses, pillows, carpets, upholstered furniture, bed covers, clothes, soft toys and any woven material. The principal allergen of the dust mite is found in its feces. A gram of dust may contain 1,000 mites and 250,000 fecal particles. Mite antigen is easily measured in the air during house cleaning activities. Dust mites do not bite and do not cause harm to humans, other than by triggering allergies/asthma.  Ways to decrease your exposure to dust mites in your home:  1. Encase mattresses, box springs and pillows with a mite-impermeable barrier or cover  2. Wash sheets, blankets and drapes weekly in hot water (130 F) with detergent and dry them in a dryer on the hot setting.  3. Have the room cleaned frequently with a vacuum cleaner and a damp dust-mop. For carpeting or rugs, vacuuming with a vacuum cleaner equipped with a high-efficiency particulate air (HEPA) filter. The dust mite  allergic individual should not be in a room which is being cleaned and should wait 1 hour after cleaning before going into the room.  4. Do not sleep on upholstered furniture (eg, couches).  5. If possible removing carpeting, upholstered furniture  and drapery from the home is ideal. Horizontal blinds should be eliminated in the rooms where the person spends the most time (bedroom, study, television room). Washable vinyl, roller-type shades are optimal.  6. Remove all non-washable stuffed toys from the bedroom. Wash stuffed toys weekly like sheets and blankets above.  7. Reduce indoor humidity to less than 50%. Inexpensive humidity monitors can be purchased at most hardware stores. Do not use a humidifier as can make the problem worse and are not recommended.  Control of Dog or Cat Allergen Avoidance is the best way to manage a dog or cat allergy. If you have a dog or cat and are allergic to dog or cats, consider removing the dog or cat from the home. If you have a dog or cat but don't want to find it a new home, or if your family wants a pet even though someone in the household is allergic, here are some strategies that may help keep symptoms at bay:  Keep the pet out of your bedroom and restrict it to only a few rooms. Be advised that keeping the dog or cat in only one room will not limit the allergens to that room. Don't pet, hug or kiss the dog or cat; if you do, wash your hands with soap and water. High-efficiency particulate air (HEPA) cleaners run continuously in a bedroom or living room can reduce allergen levels over time. Regular use of a high-efficiency vacuum cleaner or a central vacuum can reduce allergen levels. Giving your dog or cat a bath at least once a week can reduce airborne allergen.  Control of Cockroach Allergen Cockroach allergen has been identified as an important cause of acute attacks of asthma, especially in urban settings.  There are fifty-five species of cockroach that exist in the [place], however only three, the [place], [place] species produce allergen that can affect patients with Asthma.  Allergens can be obtained from fecal particles, egg casings and secretions from cockroaches.     Remove food sources. Reduce access to water. Seal access and entry points. Spray runways with 0.5-1% Diazinon or Chlorpyrifos Blow boric acid power under stoves and refrigerator. Place bait stations (hydramethylnon) at feeding sites.  Return in about 3 months (around 04/28/2022), or if symptoms worsen or fail to improve.    Thank you for the opportunity to care for this patient.  Please do not hesitate to contact me with questions.  Althea Charon, FNP [facility]

## 2022-01-28 ENCOUNTER — Telehealth: Payer: Self-pay | Admitting: *Deleted

## 2022-01-28 DIAGNOSIS — N183 Chronic kidney disease, stage 3 unspecified: Secondary | ICD-10-CM | POA: Diagnosis not present

## 2022-01-28 DIAGNOSIS — N2581 Secondary hyperparathyroidism of renal origin: Secondary | ICD-10-CM | POA: Diagnosis not present

## 2022-01-28 DIAGNOSIS — I129 Hypertensive chronic kidney disease with stage 1 through stage 4 chronic kidney disease, or unspecified chronic kidney disease: Secondary | ICD-10-CM | POA: Diagnosis not present

## 2022-01-28 DIAGNOSIS — E1122 Type 2 diabetes mellitus with diabetic chronic kidney disease: Secondary | ICD-10-CM | POA: Diagnosis not present

## 2022-01-28 DIAGNOSIS — I5032 Chronic diastolic (congestive) heart failure: Secondary | ICD-10-CM | POA: Diagnosis not present

## 2022-01-28 DIAGNOSIS — N1832 Chronic kidney disease, stage 3b: Secondary | ICD-10-CM | POA: Diagnosis not present

## 2022-01-28 NOTE — Telephone Encounter (Signed)
-----   Message from Katrina Vega, [place] sent at 01/27/2022 11:23 AM EST ----- Patient is interested in starting [place] for asthma

## 2022-01-28 NOTE — Telephone Encounter (Signed)
Thanks

## 2022-01-28 NOTE — Telephone Encounter (Signed)
Called patient and advised approval and submit for Fasenra to [facility]. Advised of instructions with delivery, storage, dosing and initial injection in clinic with appt

## 2022-02-03 ENCOUNTER — Telehealth: Payer: Self-pay | Admitting: Family

## 2022-02-03 NOTE — Telephone Encounter (Signed)
-----   Message from Althea Charon, [place] sent at 01/27/2022 11:23 AM EST ----- Please refer to ENT due to phlegm sensation being stuck in throat

## 2022-02-03 NOTE — Telephone Encounter (Signed)
Thanks Angela Nevin!!

## 2022-02-03 NOTE — Telephone Encounter (Signed)
Referral has been placed to [facility] [address] [place], [place] [address] [phone] Joylene Igo)  Sent patient mychart message in regards to referral.

## 2022-02-04 ENCOUNTER — Encounter: Payer: Medicare Other | Admitting: Family

## 2022-02-06 NOTE — Telephone Encounter (Signed)
Patient was seen on 01/27/2022 with Dr Benson [place].

## 2022-02-13 ENCOUNTER — Encounter: Payer: Medicare Other | Admitting: Family Medicine

## 2022-03-04 DIAGNOSIS — M25512 Pain in left shoulder: Secondary | ICD-10-CM | POA: Diagnosis not present

## 2022-03-04 DIAGNOSIS — M542 Cervicalgia: Secondary | ICD-10-CM | POA: Diagnosis not present

## 2022-03-06 ENCOUNTER — Telehealth: Payer: Self-pay | Admitting: Family

## 2022-03-06 ENCOUNTER — Other Ambulatory Visit: Payer: Self-pay | Admitting: Cardiology

## 2022-03-06 DIAGNOSIS — I5032 Chronic diastolic (congestive) heart failure: Secondary | ICD-10-CM

## 2022-03-06 MED ORDER — EPINEPHRINE 0.3 MG/0.3ML IJ SOAJ: INTRAMUSCULAR | 3 refills | Status: AC

## 2022-03-06 NOTE — Telephone Encounter (Signed)
Called patient  - DOB/DPR verified - LMOVM advising medication refill for Epi Pen 0.3 mg has been electronically sent to [facility].

## 2022-03-06 NOTE — Telephone Encounter (Signed)
Patient called and needs to ave epi-pen called into [facility] on [address].[phone]

## 2022-03-10 ENCOUNTER — Ambulatory Visit (INDEPENDENT_AMBULATORY_CARE_PROVIDER_SITE_OTHER): Payer: 59

## 2022-03-10 DIAGNOSIS — J4550 Severe persistent asthma, uncomplicated: Secondary | ICD-10-CM | POA: Diagnosis not present

## 2022-03-10 MED ORDER — BENRALIZUMAB 30 MG/ML ~~LOC~~ SOSY
30.0000 mg | PREFILLED_SYRINGE | Freq: Once | SUBCUTANEOUS | Status: AC
  Administered 2022-03-10: 30 mg via SUBCUTANEOUS

## 2022-03-10 NOTE — Progress Notes (Signed)
Immunotherapy   Patient Details  Name: Katrina Vega MRN: [id number] Date of Birth: [birth year]  [birth year]  [name] started injections for  Fasenra injections  Frequency: every 4 weeks Epi-Pen:Epi-Pen Available  Consent signed on 03/10/22    Patient brought in her Fasenra injection and took it out an hour before her appointment. Patient received her Berna Bue injection today with no issues. The patient waited in office for 30 minutes. She also brought in her epi pen for her injection appointment. Patient left the [facility] office with no reaction to the Fasenra injection.   [name] 03/10/2022, 10:05 AM

## 2022-03-12 ENCOUNTER — Ambulatory Visit: Payer: 59 | Attending: Cardiology | Admitting: Cardiology

## 2022-03-12 ENCOUNTER — Encounter: Payer: Self-pay | Admitting: Cardiology

## 2022-03-12 VITALS — BP 102/58 | HR 80 | Ht 65.0 in | Wt 244.4 lb

## 2022-03-12 DIAGNOSIS — I739 Peripheral vascular disease, unspecified: Secondary | ICD-10-CM

## 2022-03-12 DIAGNOSIS — E1169 Type 2 diabetes mellitus with other specified complication: Secondary | ICD-10-CM

## 2022-03-12 DIAGNOSIS — R0609 Other forms of dyspnea: Secondary | ICD-10-CM

## 2022-03-12 DIAGNOSIS — I10 Essential (primary) hypertension: Secondary | ICD-10-CM | POA: Diagnosis not present

## 2022-03-12 DIAGNOSIS — E785 Hyperlipidemia, unspecified: Secondary | ICD-10-CM

## 2022-03-12 DIAGNOSIS — I5032 Chronic diastolic (congestive) heart failure: Secondary | ICD-10-CM

## 2022-03-12 DIAGNOSIS — I2510 Atherosclerotic heart disease of native coronary artery without angina pectoris: Secondary | ICD-10-CM

## 2022-03-12 MED ORDER — BUMETANIDE 1 MG PO TABS: 1.0000 mg | ORAL_TABLET | Freq: Every day | ORAL | 3 refills | Status: DC

## 2022-03-12 NOTE — Patient Instructions (Addendum)
Medication Instructions:   No changes  *If you need a refill on your cardiac medications before your next appointment, please call your pharmacy*   Lab Work:fasting  Lipid Hepatic  panel  If you have labs (blood work) drawn today and your tests are completely normal, you will receive your results only by: MyChart Message (if you have MyChart) OR A paper copy in the mail If you have any lab test that is abnormal or we need to change your treatment, we will call you to review the results.   Testing/Procedures:  Not needed  Follow-Up: At [facility], you and your health needs are our priority.  As part of our continuing mission to provide you with exceptional heart care, we have created designated Provider Care Teams.  These Care Teams include your primary Cardiologist (physician) and Advanced Practice Providers (APPs -  Physician Assistants and Nurse Practitioners) who all work together to provide you with the care you need, when you need it.     Your next appointment:   6 month(s) call in March for appt in July 2024  The format for your next appointment:   In Person  Provider:   Glenetta Hew, MD

## 2022-03-12 NOTE — Progress Notes (Signed)
Primary Care Provider: Marrian Salvage, [place] Cardiologist: Glenetta Hew, MD -> Previously seen by Dr. Christen Butter Electrophysiologist: None Nephrologist: Dr. Rexene Edison -> [facility]  Clinic Note: No chief complaint on file.  ===================================  ASSESSMENT/PLAN   Problem List Items Addressed This Visit       Cardiology Problems   PAD (peripheral artery disease) [facility]): R Post Tib DES, Bilat SFA stents (Chronic)    No active claudication symptoms.  He seems to doing pretty well with this.  Continue CRF modification measures as noted in CAD. Monitor for claudication. If not checked elsewhere, will check lower extremity arterial Dopplers at next follow-up (most recent Dopplers were in 2022).      Relevant Medications   bumetanide (BUMEX) 1 MG tablet   Hyperlipidemia due to type 2 diabetes mellitus ([place]) - Primary (Chronic)    Has not had lipid panel checked in some time.  She is due to get labs checked at her endocrinologist office on January 22.   .  Will write order for FLP and LFTs to be drawn either with her endocrinology labs, or if necessary drawn separately.  I would hope that her endocrinologist would allow her to not have time to lab draws.  Continue rosuvastatin for now along with Jardiance, Ozempic, and standing insulin regimen per PCP.      Relevant Medications   bumetanide (BUMEX) 1 MG tablet   Other Relevant Orders   Hepatic function panel   Lipid panel   Essential hypertension (Chronic)    Borderline hypotension on current meds.  No change for now.      Relevant Medications   bumetanide (BUMEX) 1 MG tablet   Chronic diastolic congestive heart failure (HCC) (Chronic)    GR 1 DD on Echo with well-controlled blood pressure-if not borderline hypotension. NYHA Class II symptoms suspect most of her dyspnea is not related to HFpEF.  Remains on Lopressor 50 mg twice daily which could be consolidated to Toprol, but heart  rate is 80 bpm. He is on modest dose lisinopril which would be the first choice to decrease if BP stays low. She is on standing dose of Bumex and we discussed sliding scale.  No real change in previous dosing.  Want to avoid excess dosing, especially if her weight is not changed. => Would not do more than 2 days of extra doses, if weight remains elevated, would alternate every other day taking extra dose.      Relevant Medications   bumetanide (BUMEX) 1 MG tablet   Other Relevant Orders   Hepatic function panel   Lipid panel   Atherosclerotic heart disease of native coronary artery without angina pectoris (Chronic)    Relatively stable for now with no active Angina Symptoms -- +/- HFpEF Sx.   Myoview May 2021 - Non-ischemia. Remains on stable regimen Currently taking Lopressor 50 mg twice daily, low threshold to consolidate to Toprol once BP stabilizes. On Imdur for antianginal benefit. She is also on 20 mg lisinopril, I would potentially consider reducing that dose if blood pressure remains low.  Agree with nephrology. Remains on both Ozempic and Jardiance for diabetes and 40 mg rosuvastatin for lipids. He is on long-term Plavix monotherapy with PAD and CAD.  At this point from a financial standpoint she would prefer to stay on Plavix as opposed to trying low-dose Xarelto. Ok to hold Plavix 5-7 days pre-op for procedures or surgeries.      Relevant Medications   bumetanide (  BUMEX) 1 MG tablet   Other Visit Diagnoses     Dyspnea on exertion       Relevant Medications   bumetanide (BUMEX) 1 MG tablet   Other Relevant Orders   Hepatic function panel   Lipid panel      ===================================  HPI:    Katrina Vega is a 67 y.o. female with a complicated PMH below who presents today for 15-monthfollow-up.  NOTABLE PMH CAD (2008): 2 v DES PCI to the LAD and  RI - Taxus DES stents,  NSTEMI IN 2013 S/P BALLOON ANGIOPLASTY TO SMALL RCA  Gross chronic HFpEF - but  most recent Echo only notes Gr 1 DD. Recent Hospitalizations in Oct 2023 for multifactorial dyspnea at least in part acute on chronic HFpEF treated with IV diuretic. HLD/HTN, Morbid Obesity - OSA (CPAP intolerant)  DM-2 CKD IIIa -> creatinine as of 11/20/2021 was 1.2 PAD - R Post Tib DES, Bilat SFA stents COPD/Asthma - complicated by Seasonal Allergies.  OSA on CPAP Iron deficiency anemia -> [facility]  AMAKI HEGEwas last seen on January 03, 2022 establish new cardiology care.  She had a couple hospitalizations and was quite frustrated with her care.  She actually was doing okay, doing better on the Bumex.  Was doing okay with CPAP.  Mild leg swelling but controlled.  Baseline exertional dyspnea with off-and-on musculoskeletal chest pains.  Worse with coughing. Discussed sliding scale Bumex dosing but also holding the dose if her weight drops too low.  No BP room to [place]  Otherwise no medication changes.  Considered converting from Lopressor to Toprol.  Recent Hospitalizations/outside Clinic Visits: No hospitalizations. Seen by nephrology on 01/28/2022 -> noted CKD-3 (baseline creatinine 1.1-1.5) secondary to diabetes, HTN and obesity.  Improving proteinuria. Agreed with Bumex, and judicious use to avoid taking in the setting of COPD or asthma exacerbation.  Continue Jardiance and Ozempic.  Noted lower blood pressures on lisinopril and metoprolol.  Suspect the doses may need to be reduced. She has also been seen (January 27, 2022) by the allergy and immunology specialists, and is now pending immunotherapy for asthma.  Reviewed  CV studies:    The following studies were reviewed today: (if available, images/films reviewed: From Epic Chart or Care Everywhere) None:  Interval History:   Katrina MULLINAXreturns here for 254-monthollow-up stating that she actually is doing pretty well.  She said back in December she  had gained some weight and had swollen up a little bit.  She doubled up her Bumex for couple days and her weights have trended back down now to baseline, in fact she is down to a lower weight than she was when I last saw her stating this is really where she normally has been (weight here of 244 pounds correlates to 238-239 pounds at home).  She says she took 2 doses for 2 days, and then every other day for about a week to achieve this.  At that timeframe she had noted a couple episodes of PND correlating with weight gain.  She has not had any dizziness or lightheadedness or wooziness.  No syncope or near syncope.  No rapid irregular heartbeats or palpitations, no TIA or amaurosis fugax.  She has her baseline dyspnea and is hoping that the immunotherapy will help her symptoms.  She thinks she had a recent URI symptom, with some coughing and some chest discomfort with coughing.  CV Review of Symptoms (Summary): positive for - dyspnea on exertion, shortness of breath, and recent weight gain with swelling, now resolved , 2-3 episodes of PND also now resolved negative for - chest pain, irregular heartbeat, orthopnea, palpitations, rapid heart rate, or syncope or near syncope, TIA/amaurosis fugax, claudication  REVIEWED OF SYSTEMS   Review of Systems  Constitutional:  Positive for malaise/fatigue and weight loss (Weight is now back down to what she considers her baseline.).  HENT:  Positive for congestion. Negative for nosebleeds and sinus pain.   Respiratory:  Positive for cough (With congestion-started to clear up now.) and shortness of breath (Baseline.). Negative for wheezing (Having some issues with asthma).   Cardiovascular:  Positive for chest pain (Some chest pain with coughing with recent URI.).       Impression recent weight gain treated with increased dose of Bumex.  See HPI  Gastrointestinal:  Negative for blood in stool, constipation, diarrhea and melena.  Genitourinary:  Negative for  dysuria, frequency (She does have nocturia) and hematuria.  Musculoskeletal:  Positive for joint pain and myalgias.  Neurological:  Positive for weakness (Generalized leg weakness.) and headaches (With congestion). Negative for dizziness and focal weakness.  Psychiatric/Behavioral:  Positive for memory loss. Negative for depression (Mild dysthymia). The patient is not nervous/anxious and does not have insomnia.    I have reviewed and (if needed) personally updated the patient's problem list, medications, allergies, past medical and surgical history, social and family history.   PAST MEDICAL HISTORY   Past Medical History:  Diagnosis Date   Asthma    CAD S/P two-vessel DES PCI 2008   LAD and RI; 2013 PTCA of small RCA.   CHF (congestive heart failure), NYHA class I, chronic, diastolic ([place]) 4098   Recent Echo 12/16/2021: Mild concentric LVH.  EF 59%.  GI 1 DD.  Normal LAP-(Mildly dilated LA;; has converted from to torsemide and now on bumetanide   CKD stage 3 due to type 2 diabetes mellitus (HCC)    COPD (chronic obstructive pulmonary disease) ([place]) 1191   Complicated by asthma and seasonal allergies.   Diabetes mellitus, type II, insulin dependent ([place]) 2010   On insulin 60 units TID Premeal.  Also on Jardiance and Ozempic.   Eczema    Essential hypertension    Heart attack ([place]) 2008   2008-two-vessel PCI; 2013 PTCA only small RCA   History of left hip replacement 04/2018   Hyperlipidemia associated with type 2 diabetes mellitus (HCC)    140 mg rosuvastatin   Insulin pump in place    PAD (peripheral artery disease) ([place])    Status post bilateral SFA stents and right posterior tibial DES stent    PAST SURGICAL HISTORY   Past Surgical History:  Procedure Laterality Date   AAA DUPLEX  10/09/2020   Max Aorta (sac) diameter 2.51 cm prox with mild ectasia in Prox Aorta. Diffuse plaque in mid-distal Aorta. Bilateral ICA poorly visulailzed - appear to be Severely stenosed with difuse  plaque. - Consider CTA of cath directed Angio.   APPENDECTOMY  1974   BIOPSY  02/22/2021   Procedure: BIOPSY;  Surgeon: Carol Ada, MD;  Location: [facility];  Service: Endoscopy;;   CARPAL TUNNEL RELEASE  2017   COLONOSCOPY WITH PROPOFOL N/A 02/22/2021   Procedure: COLONOSCOPY WITH PROPOFOL;  Surgeon: Carol Ada, MD;  Location: [facility];  Service: Endoscopy;  Laterality: N/A;   CORONARY BALLOON ANGIOPLASTY  2013   PTCA of RCA followed by PCI  CORONARY STENT INTERVENTION  2008   Text DES PCI to LAD and RI/OM1; also prox RCA   ESOPHAGOGASTRODUODENOSCOPY (EGD) WITH PROPOFOL N/A 02/22/2021   Procedure: ESOPHAGOGASTRODUODENOSCOPY (EGD) WITH PROPOFOL;  Surgeon: Carol Ada, MD;  Location: [facility];  Service: Endoscopy;  Laterality: N/A;   LEA Dopplers  10/09/2020   R mid & Distal SFA -CTO - dampend flow in R Pop via collaterals - Moderate velocity increase in R PFA. No signficant stenosis in LLE.   R ABI 0.97) - normal. L ABI - 0.91 w/ monophasic flow in L AT -> c/w 11/2019- R SFA new.   LEFT HEART CATH AND CORONARY ANGIOGRAPHY  12/22/2017   Mild LM plaque.  Mild proximal LAD plaque.  High D1-40% ostial.  Patent mid LAD DES (Taxus from 2008), large distal LAD free disease; Prox LCx-OM1 stents patent (Taxus 2008). Small RCA - patent prox stent(~50-60% ISR), PTCA site from 2013 - patent   LOWER EXTREMITY ANGIOGRAM Bilateral 12/22/2017   Bilateral SFA stents with evidence of severe right and mid left ISR bilateral popliteal arteries proximal trifurcation vessels are patent.  Moderate to severe lesion involving mid R AntTib, poor distal flow in L Ant Tib - 2 V runoff Bilat. --> referred for R SFA Laser Atherectomy & DCB 5 x 120 mm.   LOWER EXTREMITY INTERVENTION Right 12/22/2017   Laser atherectomy of right SFA followed by [facility] with 5.0 x 120 mm impact.-For severe right SFA ISR   LUMBAR SPINE SURGERY  2010   NM MYOVIEW LTD  07/07/2019   [facility]): Lexiscan.   Nondiagnostic EKG.  Dyspnea with effusion.  No ischemia or infarction.  Soft tissue attenuation noted.Marland Kitchen  LVEF 72%.  No RWMA.  LOW RISK.--No change from 11/2017   POLYPECTOMY  02/22/2021   Procedure: POLYPECTOMY;  Surgeon: Carol Ada, MD;  Location: [facility];  Service: Endoscopy;;   REPLACEMENT TOTAL KNEE  2017 and 2018   TONSILLECTOMY     TOTAL ABDOMINAL HYSTERECTOMY  1989   TOTAL HIP ARTHROPLASTY  04/2018   TOTAL SHOULDER ARTHROPLASTY  11/2019   TRANSTHORACIC ECHOCARDIOGRAM  07/04/2019   Normal LV size, mild LVH, hyperdynamic LVEF at >65% with grade 1 diastolic dysfunction.  Otherwise no other significant abnormality.  Poor quality due to patient body habitus.   TRANSTHORACIC ECHOCARDIOGRAM  12/16/2021   [facility]) normal LV size and function.  Moderate concentric LVH.  Normal WM.  EF estimated 59%.  GR 1 DD.  Mild LA dilation.  No valvular lesions.    Immunization History  Administered Date(s) Administered   Fluad Quad(high Dose 65+) 12/17/2021   Influenza Split 12/26/2010, 11/21/2011   Influenza, Quadrivalent, Recombinant, Inj, Pf 11/10/2018   Influenza,inj,Quad PF,6+ Mos 03/12/2015, 11/10/2015, 11/26/2016, 12/02/2017   Influenza-Unspecified 11/21/2011, 03/12/2015, 12/02/2017   PFIZER(Purple Top)SARS-COV-2 Vaccination 03/17/2019, 04/07/2019   PPD Test 04/28/2017   Pneumococcal Polysaccharide-23 06/14/2012   Tdap 09/24/2010   Zoster Recombinat (Shingrix) 08/07/2015, 11/17/2018   Zoster, Live 08/12/2015    MEDICATIONS/ALLERGIES   Current Meds  Medication Sig   albuterol (PROVENTIL) (2.5 MG/3ML) 0.083% nebulizer solution Take 3 mLs (2.5 mg total) by nebulization every 6 (six) hours as needed for wheezing or shortness of breath.   albuterol (VENTOLIN HFA) 108 (90 Base) MCG/ACT inhaler Inhale 2 puffs into the lungs every 6 (six) hours as needed for wheezing.   BD AUTOSHIELD DUO 30G X 5 MM MISC 1 KIT BY MISC ROUTE 3 TIMES DAILY BEFORE MEALS    benzonatate (TESSALON) 200 MG  capsule Take 1 capsule (200 mg total) by mouth 2 (two) times daily as needed for cough.   Budeson-Glycopyrrol-Formoterol (BREZTRI AEROSPHERE) 160-9-4.8 MCG/ACT AERO Inhale two puffs twice daily to prevent cough or wheeze.  Rinse, gargle, and spit after use.   cetirizine (ZYRTEC) 10 MG tablet Take 1 tablet by mouth daily.   Cholecalciferol 50 MCG (2000 UT) TABS Take 2,000 Units by mouth daily.   clopidogrel (PLAVIX) 75 MG tablet Take 1 tablet (75 mg total) by mouth daily.   Continuous Blood Gluc Sensor (DEXCOM G6 SENSOR) MISC USE 1 SENSOR AS NEEDED CHANGE EVERY 10 DAYS   Continuous Blood Gluc Transmit (DEXCOM G6 TRANSMITTER) MISC    cromolyn (OPTICROM) 4 % ophthalmic solution Place 1 drop into both eyes 4 (four) times daily. INSTILL 1 DROP INTO BOTH EYES UP TO 4 TIMES DAILY AS NEEDED FOR  RED, ITCHY, OR WATERY EYES   empagliflozin (JARDIANCE) 10 MG TABS tablet Take 1 tablet (10 mg total) by mouth daily before breakfast.   EPINEPHrine (EPIPEN 2-PAK) 0.3 mg/0.3 mL IJ SOAJ injection Use as directed for severe allergic reactions   FASENRA PEN 30 MG/ML SOAJ Inject into the skin.   gabapentin (NEURONTIN) 300 MG capsule TAKE 1 CAPSULE BY MOUTH 3 TIMES  DAILY (Patient taking differently: Take 300 mg by mouth 3 (three) times daily.)   glucagon 1 MG injection Inject 1 mg into the muscle once as needed (low blood sugar).   glucose 4 GM chewable tablet Chew 1 tablet by mouth once as needed for low blood sugar.   HUMALOG 100 UNIT/ML injection Inject into the skin.   HUMULIN N 100 UNIT/ML injection Inject 60 Units into the skin 3 (three) times daily before meals.   Insulin Disposable Pump (OMNIPOD 5 G6 INTRO, GEN 5,) KIT CHANGE POD EVERY 2 DAYS   isosorbide mononitrate (IMDUR) 60 MG 24 hr tablet Take 1 tablet (60 mg total) by mouth daily.   levocetirizine (XYZAL) 5 MG tablet TAKE 1 TABLET BY MOUTH EVERY  EVENING IF NEEDED   lisinopril (ZESTRIL) 20 MG tablet Take 1 tablet (20 mg  total) by mouth daily.   methocarbamol (ROBAXIN) 500 MG tablet Take 1 tablet (500 mg total) by mouth every 8 (eight) hours as needed for muscle spasms.   metoprolol tartrate (LOPRESSOR) 50 MG tablet Take 1 tablet (50 mg total) by mouth every 12 (twelve) hours.   montelukast (SINGULAIR) 10 MG tablet TAKE 1 TABLET(10 MG) BY MOUTH AT BEDTIME (Patient taking differently: Take 10 mg by mouth at bedtime.)   nitroGLYCERIN (NITROSTAT) 0.4 MG SL tablet Place 1 tablet (0.4 mg total) under the tongue every 5 (five) minutes as needed for chest pain.   OZEMPIC, 2 MG/DOSE, 8 MG/3ML SOPN Inject 2 mg into the skin once a week.   pantoprazole (PROTONIX) 40 MG tablet TAKE 1 TABLET BY MOUTH IN THE  MORNING AND AT BEDTIME (Patient taking differently: Take 40 mg by mouth 2 (two) times daily.)   potassium chloride (KLOR-CON) 10 MEQ tablet Take 1 tablet (10 mEq total) by mouth daily.   Respiratory Therapy Supplies (CARETOUCH 2 CPAP HOSE HANGER) MISC by Does not apply route.   rosuvastatin (CRESTOR) 40 MG tablet Take 1 tablet (40 mg total) by mouth at bedtime.   [DISCONTINUED] bumetanide (BUMEX) 1 MG tablet Take 1 tablet (1 mg total) by mouth daily.    Allergies  Allergen Reactions   Contrast Media [Iodinated Contrast Media] Shortness Of Breath and Other (See Comments)    sob,  chest tight   Morphine Itching   Statins Diarrhea and Other (See Comments)    nausea, diarrhea, myalgias   Ioversol     SOCIAL HISTORY/FAMILY HISTORY   Reviewed in Epic:  Pertinent findings:  Social History   Tobacco Use   Smoking status: Former    Packs/day: 0.50    Years: 15.00    Total pack years: 7.50    Types: Cigarettes    Quit date: 10/14/1995    Years since quitting: 26.4   Smokeless tobacco: Never  Vaping Use   Vaping Use: Never used  Substance Use Topics   Alcohol use: Not Currently   Drug use: Not Currently   Social History   Social History Narrative   Not on file    OBJCTIVE -PE, EKG, labs   Wt Readings  from Last 3 Encounters:  03/12/22 244 lb 6.4 oz (110.9 kg)  01/27/22 249 lb 14.4 oz (113.4 kg)  01/24/22 251 lb (113.9 kg)    Physical Exam: BP (!) 102/58   Pulse 80   Ht '5\' 5"'$  (1.651 m)   Wt 244 lb 6.4 oz (110.9 kg)   SpO2 93%   BMI 40.67 kg/m  Physical Exam Vitals reviewed.  Constitutional:      General: She is not in acute distress.    Appearance: Normal appearance. She is not toxic-appearing.     Comments: Morbidly obese, but back to baseline weight.  Mild chronically ill appearance.  HENT:     Head: Normocephalic and atraumatic.  Neck:     Vascular: No carotid bruit or JVD.  Cardiovascular:     Rate and Rhythm: Normal rate and regular rhythm. Occasional Extrasystoles are present.    Chest Wall: PMI is not displaced (Unable to palpate).     Pulses: Decreased pulses.     Heart sounds: S1 normal and S2 normal. Heart sounds are distant. Murmur (1/6 SEM at RUSB) heard.     No friction rub. No gallop.  Pulmonary:     Effort: Pulmonary effort is normal.     Comments: Distant breath sounds, with mild adventitial sounds.  No obvious rales or rhonchi.  No wheezing. Chest:     Chest wall: Tenderness present.  Musculoskeletal:        General: Swelling (Mild puffy swelling and not really edema.) present.     Cervical back: Normal range of motion and neck supple.  Skin:    General: Skin is warm and dry.  Neurological:     General: No focal deficit present.     Mental Status: She is alert and oriented to person, place, and time.     Gait: Gait abnormal.  Psychiatric:        Mood and Affect: Mood normal.        Behavior: Behavior normal.        Thought Content: Thought content normal.        Judgment: Judgment normal.      Adult ECG Report N/a  Recent Labs:  Due for labs @ Endo 1/22 -> will ask for FLP and CMP. [place]: Related to Renal Function Panel Component 01/28/22 11/04/21  Sodium 138 139  Potassium 4.3  4.4   Chloride 104 105  CO2 25  25  BUN 17 15  Glucose 136 High   99   Calcium 10.0 9.9  Phosphorus 4.1  3.6   Albumin 4.5 4.5  Creatinine 1.56 High  1.17  Anion Gap 9 9  Est.  GFR 36 Low   51 Low     Intact PTH 141 High       Latest Ref Rng & Units 01/24/2022   10:55 AM 12/22/2021   12:19 AM 12/21/2021   12:22 AM  CBC  WBC 4.0 - 10.5 K/uL 5.6  5.2  5.0   Hemoglobin 12.0 - 15.0 g/dL 12.3  11.3  11.7   Hematocrit 36.0 - 46.0 % 40.3  35.7  37.1   Platelets 150 - 400 K/uL 237  231  243     Lab Results  Component Value Date   HGBA1C 7.7 (H) 12/20/2021   No results found for: "TSH"  ================================================== I spent a total of 35 minutes with the patient spent in direct patient consultation.  Additional time spent with chart review  / charting (studies, outside notes, etc): 17 min Total Time: 52 min  Current medicines are reviewed at length with the patient today.  (+/- concerns) n/a  Notice: This dictation was prepared with Dragon dictation along with smart phrase technology. Any transcriptional errors that result from this process are unintentional and may not be corrected upon review.  Studies Ordered:   Orders Placed This Encounter  Procedures   Hepatic function panel   Lipid panel   Meds ordered this encounter  Medications   bumetanide (BUMEX) 1 MG tablet    Sig: Take 1 tablet (1 mg total) by mouth daily.    Dispense:  90 tablet    Refill:  3    Patient Instructions / Medication Changes & Studies & Tests Ordered   Patient Instructions  Medication Instructions:   No changes  *If you need a refill on your cardiac medications before your next appointment, please call your pharmacy*   Lab Work:fasting  Lipid Hepatic  panel  If you have labs (blood work) drawn today and your tests are completely normal, you will receive your results only by: MyChart Message (if you have MyChart) OR A paper copy in the mail If you have any lab test that is abnormal or we need to  change your treatment, we will call you to review the results.   Testing/Procedures:  Not needed  Follow-Up: At [facility], you and your health needs are our priority.  As part of our continuing mission to provide you with exceptional heart care, we have created designated Provider Care Teams.  These Care Teams include your primary Cardiologist (physician) and Advanced Practice Providers (APPs -  Physician Assistants and Nurse Practitioners) who all work together to provide you with the care you need, when you need it.     Your next appointment:   6 month(s) call in March for appt in July 2024  The format for your next appointment:   In Person  Provider:   Glenetta Hew, MD     Leonie Man, MD, MS Glenetta Hew, M.D., M.S. Interventional Cardiologist  [place]  Pager # [phone] Phone # [phone] [address] [place], [place] [address]   Thank you for choosing [facility] [place]!!

## 2022-03-14 DIAGNOSIS — M6281 Muscle weakness (generalized): Secondary | ICD-10-CM | POA: Diagnosis not present

## 2022-03-14 DIAGNOSIS — M25612 Stiffness of left shoulder, not elsewhere classified: Secondary | ICD-10-CM | POA: Diagnosis not present

## 2022-03-14 DIAGNOSIS — M542 Cervicalgia: Secondary | ICD-10-CM | POA: Diagnosis not present

## 2022-03-14 DIAGNOSIS — S161XXD Strain of muscle, fascia and tendon at neck level, subsequent encounter: Secondary | ICD-10-CM | POA: Diagnosis not present

## 2022-03-16 NOTE — Assessment & Plan Note (Addendum)
No active claudication symptoms.  He seems to doing pretty well with this.  Continue CRF modification measures as noted in CAD. Monitor for claudication. If not checked elsewhere, will check lower extremity arterial Dopplers at next follow-up (most recent Dopplers were in 2022).

## 2022-03-16 NOTE — Assessment & Plan Note (Signed)
Borderline hypotension on current meds.  No change for now.

## 2022-03-16 NOTE — Assessment & Plan Note (Signed)
Has not had lipid panel checked in some time.  She is due to get labs checked at her endocrinologist office on January 22.   .  Will write order for FLP and LFTs to be drawn either with her endocrinology labs, or if necessary drawn separately.  I would hope that her endocrinologist would allow her to not have time to lab draws.  Continue rosuvastatin for now along with Jardiance, Ozempic, and standing insulin regimen per PCP.

## 2022-03-16 NOTE — Assessment & Plan Note (Addendum)
Relatively stable for now with no active Angina Symptoms -- +/- HFpEF Sx.   Myoview May 2021 - Non-ischemia. Remains on stable regimen Currently taking Lopressor 50 mg twice daily, low threshold to consolidate to Toprol once BP stabilizes. On Imdur for antianginal benefit. She is also on 20 mg lisinopril, I would potentially consider reducing that dose if blood pressure remains low.  Agree with nephrology. Remains on both Ozempic and Jardiance for diabetes and 40 mg rosuvastatin for lipids. He is on long-term Plavix monotherapy with PAD and CAD.  At this point from a financial standpoint she would prefer to stay on Plavix as opposed to trying low-dose Xarelto. Ok to hold Plavix 5-7 days pre-op for procedures or surgeries.

## 2022-03-16 NOTE — Assessment & Plan Note (Addendum)
GR 1 DD on Echo with well-controlled blood pressure-if not borderline hypotension. NYHA Class II symptoms suspect most of her dyspnea is not related to HFpEF.  Remains on Lopressor 50 mg twice daily which could be consolidated to Toprol, but heart rate is 80 bpm. He is on modest dose lisinopril which would be the first choice to decrease if BP stays low. She is on standing dose of Bumex and we discussed sliding scale.  No real change in previous dosing.  Want to avoid excess dosing, especially if her weight is not changed. => Would not do more than 2 days of extra doses, if weight remains elevated, would alternate every other day taking extra dose.

## 2022-03-17 DIAGNOSIS — Z9641 Presence of insulin pump (external) (internal): Secondary | ICD-10-CM | POA: Diagnosis not present

## 2022-03-17 DIAGNOSIS — Z794 Long term (current) use of insulin: Secondary | ICD-10-CM | POA: Diagnosis not present

## 2022-03-17 DIAGNOSIS — N183 Chronic kidney disease, stage 3 unspecified: Secondary | ICD-10-CM | POA: Diagnosis not present

## 2022-03-17 DIAGNOSIS — E1140 Type 2 diabetes mellitus with diabetic neuropathy, unspecified: Secondary | ICD-10-CM | POA: Diagnosis not present

## 2022-03-17 DIAGNOSIS — E1122 Type 2 diabetes mellitus with diabetic chronic kidney disease: Secondary | ICD-10-CM | POA: Diagnosis not present

## 2022-03-17 DIAGNOSIS — Z7984 Long term (current) use of oral hypoglycemic drugs: Secondary | ICD-10-CM | POA: Diagnosis not present

## 2022-03-17 DIAGNOSIS — I509 Heart failure, unspecified: Secondary | ICD-10-CM | POA: Diagnosis not present

## 2022-03-17 DIAGNOSIS — E1165 Type 2 diabetes mellitus with hyperglycemia: Secondary | ICD-10-CM | POA: Diagnosis not present

## 2022-03-17 DIAGNOSIS — Z7985 Long-term (current) use of injectable non-insulin antidiabetic drugs: Secondary | ICD-10-CM | POA: Diagnosis not present

## 2022-03-17 DIAGNOSIS — E1159 Type 2 diabetes mellitus with other circulatory complications: Secondary | ICD-10-CM | POA: Diagnosis not present

## 2022-03-24 ENCOUNTER — Ambulatory Visit (INDEPENDENT_AMBULATORY_CARE_PROVIDER_SITE_OTHER): Payer: 59 | Admitting: Family Medicine

## 2022-03-24 ENCOUNTER — Encounter: Payer: Self-pay | Admitting: Family Medicine

## 2022-03-24 VITALS — BP 120/60 | HR 80 | Temp 97.9°F | Resp 18 | Ht 65.0 in | Wt 244.6 lb

## 2022-03-24 DIAGNOSIS — I2510 Atherosclerotic heart disease of native coronary artery without angina pectoris: Secondary | ICD-10-CM

## 2022-03-24 DIAGNOSIS — K449 Diaphragmatic hernia without obstruction or gangrene: Secondary | ICD-10-CM | POA: Diagnosis not present

## 2022-03-24 DIAGNOSIS — D509 Iron deficiency anemia, unspecified: Secondary | ICD-10-CM | POA: Diagnosis not present

## 2022-03-24 DIAGNOSIS — E1169 Type 2 diabetes mellitus with other specified complication: Secondary | ICD-10-CM

## 2022-03-24 DIAGNOSIS — Z0000 Encounter for general adult medical examination without abnormal findings: Secondary | ICD-10-CM | POA: Insufficient documentation

## 2022-03-24 DIAGNOSIS — E1165 Type 2 diabetes mellitus with hyperglycemia: Secondary | ICD-10-CM

## 2022-03-24 DIAGNOSIS — T22111A Burn of first degree of right forearm, initial encounter: Secondary | ICD-10-CM | POA: Diagnosis not present

## 2022-03-24 DIAGNOSIS — E1122 Type 2 diabetes mellitus with diabetic chronic kidney disease: Secondary | ICD-10-CM

## 2022-03-24 DIAGNOSIS — I5032 Chronic diastolic (congestive) heart failure: Secondary | ICD-10-CM

## 2022-03-24 DIAGNOSIS — E785 Hyperlipidemia, unspecified: Secondary | ICD-10-CM | POA: Diagnosis not present

## 2022-03-24 DIAGNOSIS — J4489 Other specified chronic obstructive pulmonary disease: Secondary | ICD-10-CM | POA: Diagnosis not present

## 2022-03-24 DIAGNOSIS — N183 Chronic kidney disease, stage 3 unspecified: Secondary | ICD-10-CM

## 2022-03-24 DIAGNOSIS — K2100 Gastro-esophageal reflux disease with esophagitis, without bleeding: Secondary | ICD-10-CM

## 2022-03-24 DIAGNOSIS — Z23 Encounter for immunization: Secondary | ICD-10-CM | POA: Insufficient documentation

## 2022-03-24 LAB — POC URINALSYSI DIPSTICK (AUTOMATED)
Blood, UA: NEGATIVE
Glucose, UA: POSITIVE — AB
Leukocytes, UA: NEGATIVE
Nitrite, UA: NEGATIVE
Protein, UA: POSITIVE — AB
Spec Grav, UA: 1.01 (ref 1.010–1.025)
Urobilinogen, UA: 0.2 E.U./dL
pH, UA: 7.5 (ref 5.0–8.0)

## 2022-03-24 MED ORDER — SILVER SULFADIAZINE 1 % EX CREA: 1.0000 | TOPICAL_CREAM | Freq: Every day | CUTANEOUS | 0 refills | Status: DC

## 2022-03-24 NOTE — Assessment & Plan Note (Signed)
Per cardiology On bumex

## 2022-03-24 NOTE — Assessment & Plan Note (Signed)
Per nephrology 

## 2022-03-24 NOTE — Assessment & Plan Note (Signed)
Per pulmonary 

## 2022-03-24 NOTE — Assessment & Plan Note (Signed)
.  sblipi

## 2022-03-24 NOTE — Assessment & Plan Note (Signed)
Silvadene cream  Td today

## 2022-03-24 NOTE — Assessment & Plan Note (Signed)
Pt is on statin Check labs

## 2022-03-24 NOTE — Progress Notes (Addendum)
Subjective:   By signing my name below, I, Katrina Vega, attest that this documentation has been prepared under the direction and in the presence of Ann Held, DO 40/98/11   Patient ID: Katrina Vega, female    DOB: 1955-12-01, 67 y.o.   MRN: [id number]  Chief Complaint  Patient presents with   Annual Exam    Pt states not fasting     HPI Patient is in today for CPE.  Patient's last DEXA scan was on 07/18/21 and was normal. Her last mammogram was done on 08/13/2020 and was negative for malignancy. She is due for a repeat screening mammogram. She had a colonoscopy on 02/22/2021, during which 10 polyps were resected. 3 year recall was advised at that time. She has never had an abnormal pap smear.  She is due for a tetanus booster but is otherwise UTD on her immunizations.  She has established care with ophthalmology and has scheduled an upcoming routine eye exam. She is overdue for a dental exam. Her last was x2 years ago just before moving here.  She has chronic intermittent epigastric pain with associated acid reflux. She takes Protonix without much relief. She states that she had an esophageal dilation performed in 01/2021 but feels that she may need a repeat dilation. She is followed by GI Dr. Carol Ada.  She reports that x3 days ago she dropped an iron on her right forearm. She has been applying petroleum jelly on it with improvement but continues to have some blistering and soreness.  She has been compliant with Ozempic without any negative side effects. She recently started using an automatic insulin pump with improvement of her sugars. She also wears a continue glucose monitor. She states that her most recent A1c was 5.7. She is also on Jardiance. She was last seen by endocrinology NP Jacolyn Reedy on 03/17/22. She states that she had a routine foot exam at that time and was referred to podiatry for further maintenance care. She is scheduled for podiatry consult  with Dr. Newton Pigg next month. Wt Readings from Last 5 Encounters:  03/24/22 244 lb 9.6 oz (110.9 kg)  03/12/22 244 lb 6.4 oz (110.9 kg)  01/27/22 249 lb 14.4 oz (113.4 kg)  01/24/22 251 lb (113.9 kg)  01/03/22 247 lb 9.6 oz (112.3 kg)    She is followed by [facility]. She receives [place] monthly injections at their office.  Patient is followed by nephrology Dr. Biagio Quint for her CKD. This is complicated by use of Bumex every other day for CHF management. She denies any swelling issues. She weighs herself daily. Her Bumex is managed byher cardiologist Dr. Glenetta Hew. Lab Results  Component Value Date   CREATININE 1.67 (H) 12/22/2021   BUN 14 12/22/2021   NA 137 12/22/2021   K 3.7 12/22/2021   CL 107 12/22/2021   CO2 22 12/22/2021    Patient reports having a UA and culture done in 11/2021 which revealed yeast. She was asymptomatic at that time and was not treated with any medications.  She reports having chronic, intermittent burning shoulder pain and muscle spasms. She goes to physical therapy for this. The pain is worsened with repetitive motions at work. She sees a Restaurant manager, fast food but plans to establish care with a massage therapist and acupuncturist.   She denies having any fever, new muscle pain, new joint pain, new moles, congestion, sinus pain, sore throat, chest pain, palpitations, cough, SOB, wheezing, n/v/d,  constipation, blood in stool, dysuria, frequency, hematuria, or headaches at this time.    Past Medical History:  Diagnosis Date   Asthma    CAD S/P two-vessel DES PCI 2008   LAD and RI; 2013 PTCA of small RCA.   CHF (congestive heart failure), NYHA class I, chronic, diastolic ([place]) 9233   Recent Echo 12/16/2021: Mild concentric LVH.  EF 59%.  GI 1 DD.  Normal LAP-(Mildly dilated LA;; has converted from to torsemide and now on bumetanide   CKD stage 3 due to type 2 diabetes mellitus (HCC)    COPD (chronic obstructive pulmonary  disease) ([place]) 0076   Complicated by asthma and seasonal allergies.   Diabetes mellitus, type II, insulin dependent ([place]) 2010   On insulin 60 units TID Premeal.  Also on Jardiance and Ozempic.   Eczema    Essential hypertension    Heart attack ([place]) 2008   2008-two-vessel PCI; 2013 PTCA only small RCA   History of left hip replacement 04/2018   Hyperlipidemia associated with type 2 diabetes mellitus (HCC)    140 mg rosuvastatin   Insulin pump in place    PAD (peripheral artery disease) ([place])    Status post bilateral SFA stents and right posterior tibial DES stent    Past Surgical History:  Procedure Laterality Date   AAA DUPLEX  10/09/2020   Max Aorta (sac) diameter 2.51 cm prox with mild ectasia in Prox Aorta. Diffuse plaque in mid-distal Aorta. Bilateral ICA poorly visulailzed - appear to be Severely stenosed with difuse plaque. - Consider CTA of cath directed Angio.   APPENDECTOMY  1974   BIOPSY  02/22/2021   Procedure: BIOPSY;  Surgeon: Carol Ada, MD;  Location: [facility];  Service: Endoscopy;;   CARPAL TUNNEL RELEASE  2017   COLONOSCOPY WITH PROPOFOL N/A 02/22/2021   Procedure: COLONOSCOPY WITH PROPOFOL;  Surgeon: Carol Ada, MD;  Location: [facility];  Service: Endoscopy;  Laterality: N/A;   CORONARY BALLOON ANGIOPLASTY  2013   PTCA of RCA followed by PCI   CORONARY STENT INTERVENTION  2008   Text DES PCI to LAD and RI/OM1; also prox RCA   ESOPHAGOGASTRODUODENOSCOPY (EGD) WITH PROPOFOL N/A 02/22/2021   Procedure: ESOPHAGOGASTRODUODENOSCOPY (EGD) WITH PROPOFOL;  Surgeon: Carol Ada, MD;  Location: [facility];  Service: Endoscopy;  Laterality: N/A;   LEA Dopplers  10/09/2020   R mid & Distal SFA -CTO - dampend flow in R Pop via collaterals - Moderate velocity increase in R PFA. No signficant stenosis in LLE.   R ABI 0.97) - normal. L ABI - 0.91 w/ monophasic flow in L AT -> c/w 11/2019- R SFA new.   LEFT HEART CATH AND CORONARY ANGIOGRAPHY  12/22/2017   Mild  LM plaque.  Mild proximal LAD plaque.  High D1-40% ostial.  Patent mid LAD DES (Taxus from 2008), large distal LAD free disease; Prox LCx-OM1 stents patent (Taxus 2008). Small RCA - patent prox stent(~50-60% ISR), PTCA site from 2013 - patent   LOWER EXTREMITY ANGIOGRAM Bilateral 12/22/2017   Bilateral SFA stents with evidence of severe right and mid left ISR bilateral popliteal arteries proximal trifurcation vessels are patent.  Moderate to severe lesion involving mid R AntTib, poor distal flow in L Ant Tib - 2 V runoff Bilat. --> referred for R SFA Laser Atherectomy & DCB 5 x 120 mm.   LOWER EXTREMITY INTERVENTION Right 12/22/2017   Laser atherectomy of right SFA followed by [facility] with 5.0 x 120 mm impact.-For severe right SFA  ISR   LUMBAR SPINE SURGERY  2010   NM MYOVIEW LTD  07/07/2019   [facility]): [place].  Nondiagnostic EKG.  Dyspnea with effusion.  No ischemia or infarction.  Soft tissue attenuation noted.Marland Kitchen  LVEF 72%.  No RWMA.  LOW RISK.--No change from 11/2017   POLYPECTOMY  02/22/2021   Procedure: POLYPECTOMY;  Surgeon: Carol Ada, MD;  Location: [facility];  Service: Endoscopy;;   REPLACEMENT TOTAL KNEE  2017 and 2018   TONSILLECTOMY     TOTAL ABDOMINAL HYSTERECTOMY  1989   TOTAL HIP ARTHROPLASTY  04/2018   TOTAL SHOULDER ARTHROPLASTY  11/2019   TRANSTHORACIC ECHOCARDIOGRAM  07/04/2019   Normal LV size, mild LVH, hyperdynamic LVEF at >65% with grade 1 diastolic dysfunction.  Otherwise no other significant abnormality.  Poor quality due to patient body habitus.   TRANSTHORACIC ECHOCARDIOGRAM  12/16/2021   [facility]) normal LV size and function.  Moderate concentric LVH.  Normal WM.  EF estimated 59%.  GR 1 DD.  Mild LA dilation.  No valvular lesions.    Family History  Problem Relation Age of Onset   Heart disease Mother    Breast cancer Mother    Heart attack Father    Lung cancer Father    Eczema Grandson    Allergic  rhinitis Neg Hx    Angioedema Neg Hx    Asthma Neg Hx    Atopy Neg Hx    Immunodeficiency Neg Hx    Urticaria Neg Hx     Social History   Socioeconomic History   Marital status: Single    Spouse name: Not on file   Number of children: 5   Years of education: Not on file   Highest education level: Not on file  Occupational History   Not on file  Tobacco Use   Smoking status: Former    Packs/day: 0.50    Years: 15.00    Total pack years: 7.50    Types: Cigarettes    Quit date: 10/14/1995    Years since quitting: 26.4   Smokeless tobacco: Never  Vaping Use   Vaping Use: Never used  Substance and Sexual Activity   Alcohol use: Not Currently   Drug use: Not Currently   Sexual activity: Yes  Other Topics Concern   Not on file  Social History Narrative   Not on file   Social Determinants of Health   Financial Resource Strain: Low Risk  (07/16/2021)   Overall Financial Resource Strain (CARDIA)    Difficulty of Paying Living Expenses: Not hard at all  Food Insecurity: No Food Insecurity (07/16/2021)   Hunger Vital Sign    Worried About Running Out of Food in the Last Year: Never true    Ran Out of Food in the Last Year: Never true  Transportation Needs: No Transportation Needs (07/16/2021)   PRAPARE - Hydrologist (Medical): No    Lack of Transportation (Non-Medical): No  Physical Activity: Inactive (07/16/2021)   Exercise Vital Sign    Days of Exercise per Week: 0 days    Minutes of Exercise per Session: 0 min  Stress: No Stress Concern Present (07/16/2021)   [place]    Feeling of Stress : Only a little  Social Connections: Moderately Isolated (07/16/2021)   Social Connection and Isolation Panel [NHANES]    Frequency of Communication with Friends and Family: More than three times a week  Frequency of Social Gatherings with Friends and Family: Never    Attends Religious  Services: More than 4 times per year    Active Member of [employer] or Organizations: No    Attends Archivist Meetings: Never    Marital Status: Never married  Intimate Partner Violence: Not At Risk (07/16/2021)   Humiliation, Afraid, Rape, and Kick questionnaire    Fear of Current or Ex-Partner: No    Emotionally Abused: No    Physically Abused: No    Sexually Abused: No    Outpatient Medications Prior to Visit  Medication Sig Dispense Refill   albuterol (PROVENTIL) (2.5 MG/3ML) 0.083% nebulizer solution Take 3 mLs (2.5 mg total) by nebulization every 6 (six) hours as needed for wheezing or shortness of breath. 75 mL 2   albuterol (VENTOLIN HFA) 108 (90 Base) MCG/ACT inhaler Inhale 2 puffs into the lungs every 6 (six) hours as needed for wheezing. 18 g 1   BD AUTOSHIELD DUO 30G X 5 MM MISC 1 KIT BY MISC ROUTE 3 TIMES DAILY BEFORE MEALS     benzonatate (TESSALON) 200 MG capsule Take 1 capsule (200 mg total) by mouth 2 (two) times daily as needed for cough. 20 capsule 0   Budeson-Glycopyrrol-Formoterol (BREZTRI AEROSPHERE) 160-9-4.8 MCG/ACT AERO Inhale two puffs twice daily to prevent cough or wheeze.  Rinse, gargle, and spit after use. 32.1 g 1   bumetanide (BUMEX) 1 MG tablet Take 1 tablet (1 mg total) by mouth daily. 90 tablet 3   cetirizine (ZYRTEC) 10 MG tablet Take 1 tablet by mouth daily.     Cholecalciferol 50 MCG (2000 UT) TABS Take 2,000 Units by mouth daily.     clopidogrel (PLAVIX) 75 MG tablet Take 1 tablet (75 mg total) by mouth daily. 90 tablet 3   Continuous Blood Gluc Sensor (DEXCOM G6 SENSOR) MISC USE 1 SENSOR AS NEEDED CHANGE EVERY 10 DAYS     Continuous Blood Gluc Transmit (DEXCOM G6 TRANSMITTER) MISC      cromolyn (OPTICROM) 4 % ophthalmic solution Place 1 drop into both eyes 4 (four) times daily. INSTILL 1 DROP INTO BOTH EYES UP TO 4 TIMES DAILY AS NEEDED FOR  RED, ITCHY, OR WATERY EYES 60 mL 1   empagliflozin (JARDIANCE) 10 MG TABS tablet Take 1 tablet (10 mg  total) by mouth daily before breakfast. 30 tablet 3   EPINEPHrine (EPIPEN 2-PAK) 0.3 mg/0.3 mL IJ SOAJ injection Use as directed for severe allergic reactions 2 each 3   FASENRA PEN 30 MG/ML SOAJ Inject into the skin.     gabapentin (NEURONTIN) 300 MG capsule TAKE 1 CAPSULE BY MOUTH 3 TIMES  DAILY (Patient taking differently: Take 300 mg by mouth 3 (three) times daily.) 270 capsule 3   glucagon 1 MG injection Inject 1 mg into the muscle once as needed (low blood sugar).     glucose 4 GM chewable tablet Chew 1 tablet by mouth once as needed for low blood sugar.     HUMALOG 100 UNIT/ML injection Inject into the skin.     HUMULIN N 100 UNIT/ML injection Inject 60 Units into the skin 3 (three) times daily before meals.     Insulin Disposable Pump (OMNIPOD 5 G6 INTRO, GEN 5,) KIT CHANGE POD EVERY 2 DAYS     isosorbide mononitrate (IMDUR) 60 MG 24 hr tablet Take 1 tablet (60 mg total) by mouth daily. 90 tablet 3   levocetirizine (XYZAL) 5 MG tablet TAKE 1 TABLET BY MOUTH EVERY  EVENING IF NEEDED 100 tablet 3   lisinopril (ZESTRIL) 20 MG tablet Take 1 tablet (20 mg total) by mouth daily. 90 tablet 3   methocarbamol (ROBAXIN) 500 MG tablet Take 1 tablet (500 mg total) by mouth every 8 (eight) hours as needed for muscle spasms. 20 tablet 0   metoprolol tartrate (LOPRESSOR) 50 MG tablet Take 1 tablet (50 mg total) by mouth every 12 (twelve) hours. 180 tablet 3   montelukast (SINGULAIR) 10 MG tablet TAKE 1 TABLET(10 MG) BY MOUTH AT BEDTIME (Patient taking differently: Take 10 mg by mouth at bedtime.) 90 tablet 1   nitroGLYCERIN (NITROSTAT) 0.4 MG SL tablet Place 1 tablet (0.4 mg total) under the tongue every 5 (five) minutes as needed for chest pain. 25 tablet 3   OZEMPIC, 2 MG/DOSE, 8 MG/3ML SOPN Inject 2 mg into the skin once a week.     pantoprazole (PROTONIX) 40 MG tablet TAKE 1 TABLET BY MOUTH IN THE  MORNING AND AT BEDTIME (Patient taking differently: Take 40 mg by mouth 2 (two) times daily.) 200 tablet  2   potassium chloride (KLOR-CON) 10 MEQ tablet Take 1 tablet (10 mEq total) by mouth daily. 90 tablet 3   Respiratory Therapy Supplies (CARETOUCH 2 CPAP HOSE HANGER) MISC by Does not apply route.     rosuvastatin (CRESTOR) 40 MG tablet Take 1 tablet (40 mg total) by mouth at bedtime. 90 tablet 3   No facility-administered medications prior to visit.    Allergies  Allergen Reactions   Contrast Media [Iodinated Contrast Media] Shortness Of Breath and Other (See Comments)    sob, chest tight   Morphine Itching   Statins Diarrhea and Other (See Comments)    nausea, diarrhea, myalgias   Ioversol     Review of Systems  Constitutional:  Negative for fever and malaise/fatigue.  HENT:  Negative for congestion, sinus pain and sore throat.   Eyes:  Negative for blurred vision.  Respiratory:  Negative for cough, shortness of breath and wheezing.   Cardiovascular:  Negative for chest pain, palpitations and leg swelling.  Gastrointestinal:  Positive for heartburn. Negative for abdominal pain, blood in stool, constipation, diarrhea, nausea and vomiting.  Genitourinary:  Negative for dysuria, frequency, hematuria and urgency.  Musculoskeletal:  Positive for joint pain and myalgias. Negative for falls.  Skin:  Negative for rash.       (-)New moles  Neurological:  Negative for dizziness, loss of consciousness and headaches.  Endo/Heme/Allergies:  Negative for environmental allergies.  Psychiatric/Behavioral:  Negative for depression. The patient is not nervous/anxious.        Objective:    Physical Exam Vitals and nursing note reviewed.  Constitutional:      General: She is not in acute distress.    Appearance: Normal appearance. She is not ill-appearing.  HENT:     Head: Normocephalic and atraumatic.     Right Ear: Tympanic membrane, ear canal and external ear normal.     Left Ear: Tympanic membrane, ear canal and external ear normal.  Eyes:     Extraocular Movements: Extraocular  movements intact.     Pupils: Pupils are equal, round, and reactive to light.  Cardiovascular:     Rate and Rhythm: Normal rate and regular rhythm.     Heart sounds: Normal heart sounds. No murmur heard.    No gallop.  Pulmonary:     Effort: Pulmonary effort is normal. No respiratory distress.     Breath sounds: Normal breath sounds. No  wheezing or rales.  Abdominal:     General: Bowel sounds are normal. There is no distension.     Palpations: Abdomen is soft.     Tenderness: There is no abdominal tenderness. There is no guarding.  Skin:    General: Skin is warm and dry.  Neurological:     Mental Status: She is alert and oriented to person, place, and time.  Psychiatric:        Judgment: Judgment normal.    BP 120/60 (BP Location: Right Arm, Patient Position: Sitting, Cuff Size: Large)   Pulse 80   Temp 97.9 F (36.6 C) (Oral)   Resp 18   Ht '5\' 5"'$  (1.651 m)   Wt 244 lb 9.6 oz (110.9 kg)   SpO2 95%   BMI 40.70 kg/m  Wt Readings from Last 3 Encounters:  03/24/22 244 lb 9.6 oz (110.9 kg)  03/12/22 244 lb 6.4 oz (110.9 kg)  01/27/22 249 lb 14.4 oz (113.4 kg)       Assessment & Plan:  Preventative health care  Superficial burn of right forearm, initial encounter Assessment & Plan: Silvadene cream  Td today  Orders: -     Silver sulfADIAZINE; Apply 1 Application topically daily.  Dispense: 50 g; Refill: 0 -     Td vaccine greater than or equal to 7yo preservative free IM  Iron deficiency anemia, unspecified iron deficiency anemia type -     CBC with Differential/Platelet -     Comprehensive metabolic panel -     Lipid panel -     TSH  CKD stage 3 due to type 2 diabetes mellitus (HCC) -     CBC with Differential/Platelet -     Comprehensive metabolic panel -     Lipid panel -     TSH -     Microalbumin / creatinine urine ratio -     POCT Urinalysis Dipstick (Automated)  Hyperlipidemia, unspecified hyperlipidemia type -     CBC with Differential/Platelet -      Comprehensive metabolic panel -     Lipid panel -     TSH  Hiatal hernia with GERD and esophagitis  Controlled type 2 diabetes mellitus with hyperglycemia, without long-term current use of insulin (HCC) Assessment & Plan: Per nephrology  Orders: -     CBC with Differential/Platelet -     Comprehensive metabolic panel -     Lipid panel -     TSH -     Microalbumin / creatinine urine ratio -     POCT Urinalysis Dipstick (Automated)  Need for tetanus booster -     Td vaccine greater than or equal to 7yo preservative free IM  Hyperlipidemia due to type 2 diabetes mellitus (HCC) Assessment & Plan: .sblipi   Asthma-COPD overlap syndrome Assessment & Plan: Per pulmonary   Atherosclerosis of native coronary artery of native heart without angina pectoris Assessment & Plan: Pt is on statin Check labs    Chronic diastolic congestive heart failure [facility]) Assessment & Plan: Per cardiology On bumex       I,Alexis Herring,acting as a scribe for Ann Held, DO.,have documented all relevant documentation on the behalf of Ann Held, DO,as directed by  Ann Held, DO while in the presence of Ann Held, DO.   I, Ann Held, DO, personally preformed the services described in this documentation.  All medical record entries made by the scribe were at my  direction and in my presence.  I have reviewed the chart and discharge instructions (if applicable) and agree that the record reflects my personal performance and is accurate and complete. 03/24/22   Ann Held, DO

## 2022-03-25 LAB — COMPREHENSIVE METABOLIC PANEL
ALT: 18 U/L (ref 0–35)
AST: 14 U/L (ref 0–37)
Albumin: 4.3 g/dL (ref 3.5–5.2)
Alkaline Phosphatase: 66 U/L (ref 39–117)
BUN: 18 mg/dL (ref 6–23)
CO2: 25 mEq/L (ref 19–32)
Calcium: 9.5 mg/dL (ref 8.4–10.5)
Chloride: 108 mEq/L (ref 96–112)
Creatinine, Ser: 1.39 mg/dL — ABNORMAL HIGH (ref 0.40–1.20)
GFR: 39.08 mL/min — ABNORMAL LOW (ref >60.00)
Glucose, Bld: 79 mg/dL (ref 70–99)
Potassium: 4 mEq/L (ref 3.5–5.1)
Sodium: 141 mEq/L (ref 135–145)
Total Bilirubin: 0.3 mg/dL (ref 0.2–1.2)
Total Protein: 6.9 g/dL (ref 6.0–8.3)

## 2022-03-25 LAB — CBC WITH DIFFERENTIAL/PLATELET
Basophils Absolute: 0.1 10*3/uL (ref 0.0–0.1)
Basophils Relative: 1 % (ref 0.0–3.0)
Eosinophils Absolute: 0 10*3/uL (ref 0.0–0.7)
Eosinophils Relative: 0.1 % (ref 0.0–5.0)
HCT: 38.9 % (ref 36.0–46.0)
Hemoglobin: 12.4 g/dL (ref 12.0–15.0)
Lymphocytes Relative: 40.1 % (ref 12.0–46.0)
Lymphs Abs: 2.8 10*3/uL (ref 0.7–4.0)
MCHC: 31.9 g/dL (ref 30.0–36.0)
MCV: 83.4 fl (ref 78.0–100.0)
Monocytes Absolute: 0.5 10*3/uL (ref 0.1–1.0)
Monocytes Relative: 7.5 % (ref 3.0–12.0)
Neutro Abs: 3.5 10*3/uL (ref 1.4–7.7)
Neutrophils Relative %: 51.3 % (ref 43.0–77.0)
Platelets: 320 10*3/uL (ref 150.0–400.0)
RBC: 4.66 Mil/uL (ref 3.87–5.11)
RDW: 16.7 % — ABNORMAL HIGH (ref 11.5–15.5)
WBC: 6.9 10*3/uL (ref 4.0–10.5)

## 2022-03-25 LAB — MICROALBUMIN / CREATININE URINE RATIO
Creatinine,U: 124.7 mg/dL
Microalb Creat Ratio: 17.7 mg/g (ref 0.0–30.0)
Microalb, Ur: 22.1 mg/dL — ABNORMAL HIGH (ref 0.0–1.9)

## 2022-03-25 LAB — LIPID PANEL
Cholesterol: 120 mg/dL (ref 0–200)
HDL: 39.2 mg/dL (ref >39.00)
LDL Cholesterol: 61 mg/dL (ref 0–99)
NonHDL: 80.65
Total CHOL/HDL Ratio: 3
Triglycerides: 97 mg/dL (ref 0.0–149.0)
VLDL: 19.4 mg/dL (ref 0.0–40.0)

## 2022-03-25 LAB — TSH: TSH: 0.74 u[IU]/mL (ref 0.35–5.50)

## 2022-04-03 DIAGNOSIS — Z794 Long term (current) use of insulin: Secondary | ICD-10-CM | POA: Diagnosis not present

## 2022-04-03 DIAGNOSIS — E1165 Type 2 diabetes mellitus with hyperglycemia: Secondary | ICD-10-CM | POA: Diagnosis not present

## 2022-04-07 ENCOUNTER — Ambulatory Visit: Payer: 59

## 2022-04-07 LAB — HM DIABETES EYE EXAM

## 2022-04-13 ENCOUNTER — Other Ambulatory Visit: Payer: Self-pay | Admitting: Family

## 2022-04-17 ENCOUNTER — Other Ambulatory Visit: Payer: Self-pay

## 2022-04-17 DIAGNOSIS — E119 Type 2 diabetes mellitus without complications: Secondary | ICD-10-CM | POA: Diagnosis not present

## 2022-04-17 DIAGNOSIS — Z794 Long term (current) use of insulin: Secondary | ICD-10-CM | POA: Diagnosis not present

## 2022-04-17 DIAGNOSIS — Z7985 Long-term (current) use of injectable non-insulin antidiabetic drugs: Secondary | ICD-10-CM | POA: Diagnosis not present

## 2022-04-17 DIAGNOSIS — M79671 Pain in right foot: Secondary | ICD-10-CM | POA: Diagnosis not present

## 2022-04-17 DIAGNOSIS — L84 Corns and callosities: Secondary | ICD-10-CM | POA: Diagnosis not present

## 2022-04-17 DIAGNOSIS — M722 Plantar fascial fibromatosis: Secondary | ICD-10-CM | POA: Diagnosis not present

## 2022-04-17 DIAGNOSIS — R0609 Other forms of dyspnea: Secondary | ICD-10-CM

## 2022-04-17 DIAGNOSIS — I5032 Chronic diastolic (congestive) heart failure: Secondary | ICD-10-CM

## 2022-04-20 ENCOUNTER — Other Ambulatory Visit: Payer: Self-pay | Admitting: Family Medicine

## 2022-04-21 ENCOUNTER — Encounter: Payer: Self-pay | Admitting: Cardiology

## 2022-04-21 ENCOUNTER — Ambulatory Visit: Payer: 59 | Admitting: Cardiology

## 2022-04-21 ENCOUNTER — Ambulatory Visit (INDEPENDENT_AMBULATORY_CARE_PROVIDER_SITE_OTHER): Payer: 59 | Admitting: *Deleted

## 2022-04-21 DIAGNOSIS — J4550 Severe persistent asthma, uncomplicated: Secondary | ICD-10-CM | POA: Diagnosis not present

## 2022-04-21 MED ORDER — BENRALIZUMAB 30 MG/ML ~~LOC~~ SOSY
30.0000 mg | PREFILLED_SYRINGE | Freq: Once | SUBCUTANEOUS | Status: AC
  Administered 2022-04-21: 30 mg via SUBCUTANEOUS

## 2022-04-21 NOTE — Progress Notes (Unsigned)
Primary Physician/Referring:  Ann Held, DO  Patient ID: Katrina Vega, female    DOB: 12/07/1955, 67 y.o.   MRN: [id number]  No chief complaint on file.   HPI:    Katrina Vega  is a 67 y.o. Female patient with coronary artery disease and history of PCI to the LAD and  ramus in 2008 with implantation of Taxus stents, NSTEMI IN 2013 SP BALLOON ANGIOPLASTY TO SMALL RCA , chronic diastolic heart failure, hyperlipidemia, hypertension, diabetes mellitus with stage III chronic kidney disease, morbid obesity, OSA unable to tolerate CPAP,  peripheral arterial disease with right posterior tibial DES stent placed in 2008 and bilateral SFA stenting sometime in 2016 and atherectomy for ISR in 2019.  She presents for a 28-monthoffice visit.  She has been doing well and has not had any recurrence of chest pain, dyspnea, PND or orthopnea.  She denies any symptoms suggestive of claudication.  No PND or orthopnea, no leg edema.     Past Medical History:  Diagnosis Date   Asthma    CAD S/P two-vessel DES PCI 2008   LAD and RI; 2013 PTCA of small RCA.   CHF (congestive heart failure), NYHA class I, chronic, diastolic ([place] [id number]  Recent Echo 12/16/2021: Mild concentric LVH.  EF 59%.  GI 1 DD.  Normal LAP-(Mildly dilated LA;; has converted from to torsemide and now on bumetanide   CKD stage 3 due to type 2 diabetes mellitus (HCC)    COPD (chronic obstructive pulmonary disease) ([facility] [id number]  Complicated by asthma and seasonal allergies.   Diabetes mellitus, type II, insulin dependent ([place] 2010   On insulin 60 units TID Premeal.  Also on Jardiance and Ozempic.   Eczema    Essential hypertension    Heart attack ([facility] 2008   2008-two-vessel PCI; 2013 PTCA only small RCA   History of left hip replacement 04/2018   Hyperlipidemia associated with type 2 diabetes mellitus (HCC)    140 mg rosuvastatin   Insulin pump in place    PAD (peripheral artery disease) ([place]    Status post bilateral SFA  stents and right posterior tibial DES stent   Primary hypertension 01/20/2011      Patient's blood pressure is well controlled.  Continue current medications.   Social History   Tobacco Use   Smoking status: Former    Packs/day: 0.50    Years: 15.00    Total pack years: 7.50    Types: Cigarettes    Quit date: 10/14/1995    Years since quitting: 26.5   Smokeless tobacco: Never  Substance Use Topics   Alcohol use: Not Currently   Marital Status: Single  ROS  Review of Systems  Cardiovascular:  Negative for chest pain, claudication, dyspnea on exertion and leg swelling.  Musculoskeletal:  Positive for back pain.  Gastrointestinal:  Negative for melena.   Objective  There were no vitals taken for this visit.     03/24/2022    1:10 PM 03/12/2022   11:47 AM 01/27/2022   10:44 AM  Vitals with BMI  Height '5\' 5"'$  '5\' 5"'$    Weight 244 lbs 10 oz 244 lbs 6 oz 249 lbs 14 oz  BMI 40.7 [id number] Systolic [id number] Diastolic 60 58 70  Pulse 80 80 84     Physical Exam Constitutional:      Appearance: She is morbidly obese.     Comments: Morbidly obese in no acute  distress.  Neck:     Vascular: No carotid bruit or JVD.  Cardiovascular:     Rate and Rhythm: Normal rate and regular rhythm.     Pulses:          Carotid pulses are 2+ on the right side and 2+ on the left side.      Radial pulses are 2+ on the right side and 2+ on the left side.       Femoral pulses are 1+ on the right side and 1+ on the left side.      Dorsalis pedis pulses are 0 on the right side and 0 on the left side.       Posterior tibial pulses are 0 on the right side and 0 on the left side.     Heart sounds: Normal heart sounds. No murmur heard.    No gallop.  Pulmonary:     Effort: Pulmonary effort is normal.     Breath sounds: Normal breath sounds.  Abdominal:     General: Bowel sounds are normal.     Palpations: Abdomen is soft.     Comments: Obese. Pannus present  Musculoskeletal:     Right lower  leg: No edema.     Left lower leg: No edema.    Laboratory examination:   Lab Results  Component Value Date   NA 141 03/24/2022   K 4.0 03/24/2022   CO2 25 03/24/2022   GLUCOSE 79 03/24/2022   BUN 18 03/24/2022   CREATININE 1.39 (H) 03/24/2022   CALCIUM 9.5 03/24/2022   GFRNONAA 33 (L) 12/22/2021    Lipid Panel     Component Value Date/Time   CHOL 120 03/24/2022 1408   TRIG 97.0 03/24/2022 1408   HDL 39.20 03/24/2022 1408   CHOLHDL 3 03/24/2022 1408   VLDL 19.4 03/24/2022 1408   LDLCALC 61 03/24/2022 1408   HEMOGLOBIN A1C Lab Results  Component Value Date   HGBA1C 7.7 (H) 12/20/2021   MPG 174.29 12/20/2021    Lab Results  Component Value Date   TSH 0.74 03/24/2022    External labs:   Labs 03/06/2021:  A1c 7.7%.  Urinary protein 100 mg/dL.  Labs 10/10/2020:  Sodium 141, potassium 4.4, BUN 25, creatinine 1.33, EGFR 44 mL.  Vitamin D 08/02/2020: Markedly reduced at 15.  Medications and allergies   Allergies  Allergen Reactions   Contrast Media [Iodinated Contrast Media] Shortness Of Breath and Other (See Comments)    sob, chest tight   Morphine Itching   Statins Diarrhea and Other (See Comments)    nausea, diarrhea, myalgias   Ioversol      Current Outpatient Medications:    albuterol (PROVENTIL) (2.5 MG/3ML) 0.083% nebulizer solution, Take 3 mLs (2.5 mg total) by nebulization every 6 (six) hours as needed for wheezing or shortness of breath., Disp: 75 mL, Rfl: 2   albuterol (VENTOLIN HFA) 108 (90 Base) MCG/ACT inhaler, Inhale 2 puffs into the lungs every 6 (six) hours as needed for wheezing., Disp: 18 g, Rfl: 1   BD AUTOSHIELD DUO 30G X 5 MM MISC, 1 KIT BY MISC ROUTE 3 TIMES DAILY BEFORE MEALS, Disp: , Rfl:    benzonatate (TESSALON) 200 MG capsule, Take 1 capsule (200 mg total) by mouth 2 (two) times daily as needed for cough., Disp: 20 capsule, Rfl: 0   Budeson-Glycopyrrol-Formoterol (BREZTRI AEROSPHERE) 160-9-4.8 MCG/ACT AERO, Inhale two puffs twice daily  to prevent cough or wheeze.  Rinse, gargle, and spit after use., Disp:  32.1 g, Rfl: 1   bumetanide (BUMEX) 1 MG tablet, TAKE 1 TABLET(1 MG) BY MOUTH DAILY, Disp: 30 tablet, Rfl: 0   cetirizine (ZYRTEC) 10 MG tablet, Take 1 tablet by mouth daily., Disp: , Rfl:    Cholecalciferol 50 MCG (2000 UT) TABS, Take 2,000 Units by mouth daily., Disp: , Rfl:    clopidogrel (PLAVIX) 75 MG tablet, Take 1 tablet (75 mg total) by mouth daily., Disp: 90 tablet, Rfl: 3   Continuous Blood Gluc Sensor (DEXCOM G6 SENSOR) MISC, USE 1 SENSOR AS NEEDED CHANGE EVERY 10 DAYS, Disp: , Rfl:    Continuous Blood Gluc Transmit (DEXCOM G6 TRANSMITTER) MISC, , Disp: , Rfl:    cromolyn (OPTICROM) 4 % ophthalmic solution, Place 1 drop into both eyes 4 (four) times daily. INSTILL 1 DROP INTO BOTH EYES UP TO 4 TIMES DAILY AS NEEDED FOR  RED, ITCHY, OR WATERY EYES, Disp: 60 mL, Rfl: 1   empagliflozin (JARDIANCE) 10 MG TABS tablet, Take 1 tablet (10 mg total) by mouth daily before breakfast., Disp: 30 tablet, Rfl: 3   EPINEPHrine (EPIPEN 2-PAK) 0.3 mg/0.3 mL IJ SOAJ injection, Use as directed for severe allergic reactions, Disp: 2 each, Rfl: 3   FASENRA PEN 30 MG/ML SOAJ, Inject into the skin., Disp: , Rfl:    gabapentin (NEURONTIN) 300 MG capsule, TAKE 1 CAPSULE BY MOUTH 3 TIMES  DAILY (Patient taking differently: Take 300 mg by mouth 3 (three) times daily.), Disp: 270 capsule, Rfl: 3   glucagon 1 MG injection, Inject 1 mg into the muscle once as needed (low blood sugar)., Disp: , Rfl:    glucose 4 GM chewable tablet, Chew 1 tablet by mouth once as needed for low blood sugar., Disp: , Rfl:    HUMALOG 100 UNIT/ML injection, Inject into the skin., Disp: , Rfl:    HUMULIN N 100 UNIT/ML injection, Inject 60 Units into the skin 3 (three) times daily before meals., Disp: , Rfl:    Insulin Disposable Pump (OMNIPOD 5 G6 INTRO, GEN 5,) KIT, CHANGE POD EVERY 2 DAYS, Disp: , Rfl:    isosorbide mononitrate (IMDUR) 60 MG 24 hr tablet, Take 1 tablet  (60 mg total) by mouth daily., Disp: 90 tablet, Rfl: 3   levocetirizine (XYZAL) 5 MG tablet, TAKE 1 TABLET BY MOUTH EVERY  EVENING IF NEEDED, Disp: 100 tablet, Rfl: 3   lisinopril (ZESTRIL) 20 MG tablet, Take 1 tablet (20 mg total) by mouth daily., Disp: 90 tablet, Rfl: 3   methocarbamol (ROBAXIN) 500 MG tablet, Take 1 tablet (500 mg total) by mouth every 8 (eight) hours as needed for muscle spasms., Disp: 20 tablet, Rfl: 0   metoprolol tartrate (LOPRESSOR) 50 MG tablet, Take 1 tablet (50 mg total) by mouth every 12 (twelve) hours., Disp: 180 tablet, Rfl: 3   montelukast (SINGULAIR) 10 MG tablet, TAKE 1 TABLET(10 MG) BY MOUTH AT BEDTIME (Patient taking differently: Take 10 mg by mouth at bedtime.), Disp: 90 tablet, Rfl: 1   nitroGLYCERIN (NITROSTAT) 0.4 MG SL tablet, Place 1 tablet (0.4 mg total) under the tongue every 5 (five) minutes as needed for chest pain., Disp: 25 tablet, Rfl: 3   OZEMPIC, 2 MG/DOSE, 8 MG/3ML SOPN, Inject 2 mg into the skin once a week., Disp: , Rfl:    pantoprazole (PROTONIX) 40 MG tablet, TAKE 1 TABLET BY MOUTH IN THE  MORNING AND AT BEDTIME (Patient taking differently: Take 40 mg by mouth 2 (two) times daily.), Disp: 200 tablet, Rfl: 2  potassium chloride (KLOR-CON) 10 MEQ tablet, Take 1 tablet (10 mEq total) by mouth daily., Disp: 90 tablet, Rfl: 3   Respiratory Therapy Supplies (CARETOUCH 2 CPAP HOSE HANGER) MISC, by Does not apply route., Disp: , Rfl:    rosuvastatin (CRESTOR) 40 MG tablet, Take 1 tablet (40 mg total) by mouth at bedtime., Disp: 90 tablet, Rfl: 3   silver sulfADIAZINE (SILVADENE) 1 % cream, Apply 1 Application topically daily., Disp: 50 g, Rfl: 0    Radiology:   No results found.  Cardiac Studies:   Coronary angioplasty: -RCA PCI (Dr. Tonye Becket) 10/2011 for NSTEMI; LAD/[place] DES 2008 Taxus;  Coronary angiogram 12/22/2017: A- Left Main: Mild plaquing, no critical lesions seen.  B- LAD: The LAD contains mild plaquing proximally and gives rise to a  large high lying 1st diagonal branch in which a 40% ostial stenosis is present. Mid LAD contains a well deployed stent which is patent (2008 Taxus), no evidence for significant restenoses. The distal LAD is very large and free of significant disease.  C- Left Circumflex: Limited views of the left circumflex were obtained due to dye issues. There appears to be a proximal left circumflex and 1st obtuse marginal stented segments which are patent (Taxus 2008) and show no it evidence for significant restenoses.  D- RCA: The RCA is small and contains a patent proximal stent with 50 to 60% mid stenosis and mild plaquing noted distally.  PTCA site from 2013 is patent.  Peripheral arteriogram 12/22/2017:  There are bilateral SFA stents present with evidence for severe right and mild left in stent restenoses present. The bilateral popliteal arteries and proximal trifurcation vessels are patent. However, there may be moderate to severe lesion involving the mid right anterior tibial artery in particular. In addition, there was poor flow noted in the left anterior tibial artery with 2 vessel runoff probably present bilaterally. Laser atherectomy of right SFA followed by DCB with 5.0 x 120 mm impact.  Echocardiogram 07/04/2019: Normal LV size, mild LVH, hyperdynamic LVEF at >65% with grade 1 diastolic dysfunction.  Otherwise no other significant abnormality.  Poor quality due to patient body habitus.  Lexiscan nuclear stress test 07/07/2019: Nondiagnostic EKG.  Dyspnea during stress test. No evidence of ischemia.  Soft tissue attenuation noted.  LVEF 72%.  Normal wall motion.  Low risk study.  No significant change from 12/20/2017.  Lower extremity venous duplex 07/02/2019: No evidence of DVT in bilateral lower extremity.  Lower Extremity Arterial Duplex 10/09/2020: Right mid and distal SFA occluded, dampened flow in the popliteal artery via collaterals. Moderate velocity increase at the right profunda femoral  artery.  No hemodynamically significant stenoses are identified in the left lower extremity arterial system. This exam reveals normal perfusion of the right lower extremity (ABI 0.97) with monophasic waveform in the PT This exam reveals mildly decreased perfusion of the left lower extremity, noted at the post tibial artery level (ABI 0.90) with monophasic waveform in the left AT.  Compared to 12/16/2019, right SFA stenosis is new.  No significant change in ABI.  Abdominal Aortic Duplex 10/09/2020: The maximum aorta (sac) diameter is 2.51 cm (prox) with mild ectasia in the proximal aorta. Diffuse plaque observed in the proximal, mid and distal aorta.  Bilateral CIA are not well visualized but appear to be severely stenosed with diffuse plaque, however, the flow velocity appears normal.  Clinical correlation recommended. Consider CTA or catheter directed angiography if clinically indicated.  PCV ECHOCARDIOGRAM COMPLETE 12/16/2021  Narrative Echocardiogram 12/16/2021: Left ventricle  cavity is normal in size. Mild concentric hypertrophy of the left ventricle. Normal global wall motion. Normal LV systolic function with EF 59%. Doppler evidence of grade I (impaired) diastolic dysfunction, normal LAP. Left atrial cavity is mildly dilated. No significant valvular abnormality, IVC not well visualized.    EKG:   *** EKG 10/17/2021.  Normal sinus rhythm at rate of 72 bpm, normal axis, no evidence of ischemia, normal EKG. No significant change from 04/19/2021.   Assessment     ICD-10-CM   1. Atherosclerosis of native coronary artery of native heart without angina pectoris  I25.10     2. Primary hypertension  I10     3. PAD (peripheral artery disease) ([place]): R Post Tib DES, Bilat SFA stents  I73.9     4. Pure hypercholesterolemia  E78.00        No orders of the defined types were placed in this encounter.   No orders of the defined types were placed in this encounter.  Recommendations:    Katrina Vega is a 67 y.o. Female patient with coronary artery disease and history of PCI to the LAD and  ramus in 2008 with implantation of Taxus stents, NSTEMI IN 2013 SP BALLOON ANGIOPLASTY TO SMALL RCA , chronic diastolic heart failure, hyperlipidemia, hypertension, diabetes mellitus with stage III chronic kidney disease, morbid obesity, OSA unable to tolerate CPAP,  peripheral arterial disease with right posterior tibial DES stent placed in 2008 and bilateral SFA stenting sometime in 2016 and atherectomy for ISR in 2019.  She presents for a 19-monthoffice visit.  She has been doing well and has not had any recurrence of chest pain, dyspnea, PND or orthopnea.  She denies any symptoms suggestive of claudication.  Vascular examination reveals improved pedal pulses.  Right, has absent pulses.  Pedal on the left but no clinical evidence of critical limb ischemia.  Her diabetes is not improving, she is presently on a insulin pump.  She is also on Jardiance and since then she has not had any acute diastolic heart failure and she has discontinued taking torsemide.  I discussed with her regarding use of Ozempic, advised her to eat only half her meals and wait for at least 15 to 20 minutes prior to going back for the second which will help with weight loss and also trying to get in the water/water aerobics to improve her metabolism which would help with weight loss as well.  Otherwise she is on appropriate medical therapy, she is on Plavix for CAD and PAD and off of aspirin, blood pressure is well controlled and lipids are at goal.  No changes in the medications were done today, I will see her back in 6 months for follow-up.  I spent 40 minutes on patient encounter, I reviewed her external labs, I reviewed her medications, complexity of medical care including coronary disease and peripheral arterial disease and obesity discussed in detail.  Diet counseling and exercise counseling was also performed  today.   JAdrian Prows MD, [facility]2/26/2024, 7:20 AM Office: [phone]

## 2022-05-03 NOTE — Patient Instructions (Incomplete)
Asthma Continue montelukast 10 mg once a day to prevent cough or wheeze.  We will send in a refill Continue Breztri 2 puffs twice a day with a spacer to prevent cough or wheeze Continue albuterol 2 puffs every 4 hours as needed for cough or wheeze OR Instead use albuterol 0.083% solution via nebulizer one unit vial every 4 hours as needed for cough or wheeze Continue to avoid cigarette smoke Continue Fasenra injections every 4 weeks x 3 doses then go to every 8 weeks  Asthma control goals:  Full participation in all desired activities (may need albuterol before activity) Albuterol use two time or less a week on average (not counting use with activity) Cough interfering with sleep two time or less a month Oral steroids no more than once a year No hospitalizations   Allergic rhinitis Continue allergen avoidance measures directed toward dust mite, cockroach, weed pollen, cat, and dog Hold off on getting allergy injections until we get your breathing under better control Continue Nasacort 1 to 2 sprays in each nostril once a day as needed for nasal congestion. Continue  azelastine 2 sprays in each nostril up to twice a day as needed for runny nose/drainage down throat Consider saline nasal rinses as needed for nasal symptoms. Use this before any medicated nasal sprays for best result. Increase saline rinses to twice a day for now Continue an antihistamine once a day as needed for runny nose or itch. Remember to rotate to a different antihistamine about every 3 months. Some examples of over the counter antihistamines include Zyrtec (cetirizine), Xyzal (levocetirizine), Allegra (fexofenadine), and Claritin (loratidine).   Allergic conjunctivitis Continue cromolyn 4% eyedrops.  Use 1 drop in each eye up to 4 times a day as needed for red, itchy, or watery eyes. May try over the counter Pataday or Zaditor eye drops to see if they do not sting/burn with use  Reflux Continue dietary and lifestyle  modifications as listed below Continue pantoprazole twice a day  as previously prescribed Continue to follow up with GI   Follow up in 3 months or sooner if needed.   Lifestyle Changes for Controlling GERD When you have GERD, stomach acid feels as if it's backing up toward your mouth. Whether or not you take medication to control your GERD, your symptoms can often be improved with lifestyle changes.   Raise Your Head Reflux is more likely to strike when you're lying down flat, because stomach fluid can flow backward more easily. Raising the head of your bed 4-6 inches can help. To do this: Slide blocks or books under the legs at the head of your bed. Or, place a wedge under the mattress. Many foam stores can make a suitable wedge for you. The wedge should run from your waist to the top of your head. Don't just prop your head on several pillows. This increases pressure on your stomach. It can make GERD worse.  Watch Your Eating Habits Certain foods may increase the acid in your stomach or relax the lower esophageal sphincter, making GERD more likely. It's best to avoid the following: Coffee, tea, and carbonated drinks (with and without caffeine) Fatty, fried, or spicy food Mint, chocolate, onions, and tomatoes Any other foods that seem to irritate your stomach or cause you pain  Relieve the Pressure Eat smaller meals, even if you have to eat more often. Don't lie down right after you eat. Wait a few hours for your stomach to empty. Avoid tight belts and tight-fitting  clothes. Lose excess weight.  Tobacco and Alcohol Avoid smoking tobacco and drinking alcohol. They can make GERD symptoms worse.  Reducing Pollen Exposure The American Academy of Allergy, Asthma and Immunology suggests the following steps to reduce your exposure to pollen during allergy seasons. Do not hang sheets or clothing out to dry; pollen may collect on these items. Do not mow lawns or spend time around  freshly cut grass; mowing stirs up pollen. Keep windows closed at night.  Keep car windows closed while driving. Minimize morning activities outdoors, a time when pollen counts are usually at their highest. Stay indoors as much as possible when pollen counts or humidity is high and on windy days when pollen tends to remain in the air longer. Use air conditioning when possible.  Many air conditioners have filters that trap the pollen spores. Use a HEPA room air filter to remove pollen form the indoor air you breathe.   Control of Dust Mite Allergen Dust mites play a major role in allergic asthma and rhinitis. They occur in environments with high humidity wherever human skin is found. Dust mites absorb humidity from the atmosphere (ie, they do not drink) and feed on organic matter (including shed human and animal skin). Dust mites are a microscopic type of insect that you cannot see with the naked eye. High levels of dust mites have been detected from mattresses, pillows, carpets, upholstered furniture, bed covers, clothes, soft toys and any woven material. The principal allergen of the dust mite is found in its feces. A gram of dust may contain 1,000 mites and 250,000 fecal particles. Mite antigen is easily measured in the air during house cleaning activities. Dust mites do not bite and do not cause harm to humans, other than by triggering allergies/asthma.  Ways to decrease your exposure to dust mites in your home:  1. Encase mattresses, box springs and pillows with a mite-impermeable barrier or cover  2. Wash sheets, blankets and drapes weekly in hot water (130 F) with detergent and dry them in a dryer on the hot setting.  3. Have the room cleaned frequently with a vacuum cleaner and a damp dust-mop. For carpeting or rugs, vacuuming with a vacuum cleaner equipped with a high-efficiency particulate air (HEPA) filter. The dust mite allergic individual should not be in a room which is being cleaned  and should wait 1 hour after cleaning before going into the room.  4. Do not sleep on upholstered furniture (eg, couches).  5. If possible removing carpeting, upholstered furniture and drapery from the home is ideal. Horizontal blinds should be eliminated in the rooms where the person spends the most time (bedroom, study, television room). Washable vinyl, roller-type shades are optimal.  6. Remove all non-washable stuffed toys from the bedroom. Wash stuffed toys weekly like sheets and blankets above.  7. Reduce indoor humidity to less than 50%. Inexpensive humidity monitors can be purchased at most hardware stores. Do not use a humidifier as can make the problem worse and are not recommended.  Control of Dog or Cat Allergen Avoidance is the best way to manage a dog or cat allergy. If you have a dog or cat and are allergic to dog or cats, consider removing the dog or cat from the home. If you have a dog or cat but don't want to find it a new home, or if your family wants a pet even though someone in the household is allergic, here are some strategies that may help keep symptoms  at [place]:  Keep the pet out of your bedroom and restrict it to only a few rooms. Be advised that keeping the dog or cat in only one room will not limit the allergens to that room. Don't pet, hug or kiss the dog or cat; if you do, wash your hands with soap and water. High-efficiency particulate air (HEPA) cleaners run continuously in a bedroom or living room can reduce allergen levels over time. Regular use of a high-efficiency vacuum cleaner or a central vacuum can reduce allergen levels. Giving your dog or cat a bath at least once a week can reduce airborne allergen.  Control of Cockroach Allergen Cockroach allergen has been identified as an important cause of acute attacks of asthma, especially in urban settings.  There are fifty-five species of cockroach that exist in the [place], however only three, the [place],  [place] species produce allergen that can affect patients with Asthma.  Allergens can be obtained from fecal particles, egg casings and secretions from cockroaches.    Remove food sources. Reduce access to water. Seal access and entry points. Spray runways with 0.5-1% Diazinon or Chlorpyrifos Blow boric acid power under stoves and refrigerator. Place bait stations (hydramethylnon) at feeding sites.

## 2022-05-05 ENCOUNTER — Ambulatory Visit (INDEPENDENT_AMBULATORY_CARE_PROVIDER_SITE_OTHER): Payer: 59 | Admitting: Family

## 2022-05-05 ENCOUNTER — Encounter: Payer: Self-pay | Admitting: Family

## 2022-05-05 VITALS — BP 100/60 | HR 71 | Temp 97.7°F | Resp 16 | Wt 243.4 lb

## 2022-05-05 DIAGNOSIS — K219 Gastro-esophageal reflux disease without esophagitis: Secondary | ICD-10-CM | POA: Diagnosis not present

## 2022-05-05 DIAGNOSIS — J309 Allergic rhinitis, unspecified: Secondary | ICD-10-CM | POA: Diagnosis not present

## 2022-05-05 DIAGNOSIS — R0609 Other forms of dyspnea: Secondary | ICD-10-CM | POA: Diagnosis not present

## 2022-05-05 DIAGNOSIS — J4550 Severe persistent asthma, uncomplicated: Secondary | ICD-10-CM | POA: Diagnosis not present

## 2022-05-05 DIAGNOSIS — R09A2 Foreign body sensation, throat: Secondary | ICD-10-CM | POA: Diagnosis not present

## 2022-05-05 DIAGNOSIS — T781XXA Other adverse food reactions, not elsewhere classified, initial encounter: Secondary | ICD-10-CM

## 2022-05-05 DIAGNOSIS — H1013 Acute atopic conjunctivitis, bilateral: Secondary | ICD-10-CM | POA: Diagnosis not present

## 2022-05-05 DIAGNOSIS — H1010 Acute atopic conjunctivitis, unspecified eye: Secondary | ICD-10-CM

## 2022-05-05 NOTE — Progress Notes (Signed)
[place] [address] Dept: [phone]  FOLLOW UP NOTE  Patient ID: Katrina Vega, female    DOB: [birth year]  Age: 67 y.o. MRN: [id number] Date of Office Visit: 05/05/2022  Assessment  Chief Complaint: Follow-up  HPI Katrina Vega is a 67 year old female who presents today for follow-up of not well-controlled moderate persistent asthma, seasonal and perennial allergic rhinoconjunctivitis, gastroesophageal reflux disease, allergic conjunctivitis, and globus sensation.  She was last seen on January 27, 2022 by myself.  She denies any new diagnosis or surgery since her last office visit.  Moderate persistent asthma: She continues to take montelukast 10 mg at night, Breztri 2 puffs twice a day with a spacer, Fasenra injections per protocol and albuterol as needed.  She has received 2 Fasenra injections.  She denies any problems or reactions with her Fasenra injections.  She thinks that Berna Bue has helped her breathing.  She does report coughing fits that are occurring more often.  The cough is nonproductive.  She will cough all night.  She is not sure if her cough is due to drainage.  The cough is occurring almost every day.  She also reports wheezing a couple nights and a little shortness of breath with exertion.  She tries not to take the water pill every day to protect her kidneys.  She also reports nocturnal awakenings a couple times.  She reports that she feels like she is fighting for air couple nights she has woken up.  She denies tightness in chest, fever, and chills.  Since her last office visit she has not required any systemic steroids or made any trips to the emergency room or urgent care due to breathing problems.  She has not used her albuterol inhaler since we last saw her.  She does mention though that when she has shortness of breath sometimes her albuterol helps and sometimes it does not.  She has recently seen her cardiologist and was told that her heart looked good.   She mentions that the last time when she had fluid on her heart due to congestive heart failure that she was told that it did some damage to her lungs.  She is interested in seeing a lung doctor.  Allergic rhinitis: She is currently using Nasacort nasal spray as needed, azelastine nasal spray as needed, levocetirizine every night, and montelukast 10 mg every night.  She is interested in restarting allergy injections.  Her last allergy injection was on October 29, 2021.  She reports that when she was on allergy injections she felt like they were working.  She reports that last week she blew yellow from her nose.  Right now what she is blowing from her nose is clear in color.  When she gets in a warm atmosphere her nose runs all the time.  She also reports that she is clearing her throat and it is hard to get up.  She has an upcoming appointment with ear nose and throat in April.  She denies nasal congestion.  She has not had any sinus infections since we last saw her.  Allergic conjunctivitis: She reports occasional itchy eyes and she will also have watery eyes.  She reports that she recently saw her eye doctor and was told that she had "red spots on the left eye due to her diabetes" She was also told that due to her watery eyes where the fluid builds up that they can do a surgery to correct this, but she is  not interested.  Reflux: She continues to take pantoprazole twice a day.  She does also still follow-up with GI.  She reports reflux symptoms when she eats something that she should not.  She will also at times feel a soreness in her chest that she feels like is due to acid.  He was told at her last office appointment with GI that when they did the EGD previously that her esophagus had narrowed.  She reports that last week, on Wednesday or Thursday, she was eating chicken salad from [employer].  She normally eats this chicken salad, but added in red onion.  The chicken salad contained cranberries and sliced  almonds.  She reports after eating it she was itching all over without a rash.  She denies any concomitant cardiorespiratory and gastrointestinal symptoms.  She took an antihistamine.  She has not had almonds, chicken ,cranberries, or red onion since.  She also mentions that she cannot eat peanuts due to it causing her throat to be scratchy/itchy.  She has eaten peanuts every now and then, but not lately.  She also reports that tomatoes make her to itch, but she can eat 1 tomato slice for a BLT if she uses more lettuce.   Drug Allergies:  Allergies  Allergen Reactions   Contrast Media [Iodinated Contrast Media] Shortness Of Breath and Other (See Comments)    sob, chest tight   Morphine Itching   Statins Diarrhea and Other (See Comments)    nausea, diarrhea, myalgias   Ioversol     Review of Systems: Review of Systems  Constitutional:  Negative for chills and fever.  HENT:         Reports clear rhinorrhea now.  Last week it was yellow.  She mentions when she is in a warm atmosphere her nose will run all the time.  She also reports clearing her throat and notes that it is hard to get up.  She denies nasal congestion.  Eyes:        Reports itchy eyes at times.  She also reports watery eyes for which her eye doctor says she has fluid builds up and that they can do surgery to correct this if needed.  Respiratory:  Positive for cough, shortness of breath and wheezing.        She reports coughing fits that are occurring more frequently and almost every day.  The other night she was coughing all night.  The cough is nonproductive.  She also reports wheezing a couple nights.  She also reports shortness of breath with exertion and nocturnal awakenings 2 times due to breathing problems.  She denies tightness in chest.  Cardiovascular:  Negative for chest pain and palpitations.       Denies dizziness/lightheaded  Gastrointestinal:        Reports heartburn or reflux symptoms only when she does not eat  the right food.  Genitourinary:  Negative for frequency.  Skin:  Positive for itching. Negative for rash.       Reports itching all over without a rash after eating [employer] chicken salad that contained sliced almonds and cranberries.  She added red a red onion.  Neurological:  Positive for headaches.       Reports a headache sometimes  Endo/Heme/Allergies:  Positive for environmental allergies.     Physical Exam: BP 100/60   Pulse 71   Temp 97.7 F (36.5 C) (Temporal)   Resp 16   Wt 243 lb 6.4 oz (110.4 kg)  SpO2 95%   BMI 40.50 kg/m    Physical Exam Constitutional:      Appearance: Normal appearance.  HENT:     Head: Normocephalic and atraumatic.     Comments: Pharynx normal, eyes normal, ears normal, nose: Bilateral lower turbinates mildly edematous and slightly erythematous with no drainage noted    Right Ear: Tympanic membrane, ear canal and external ear normal.     Left Ear: Tympanic membrane, ear canal and external ear normal.     Mouth/Throat:     Mouth: Mucous membranes are moist.     Pharynx: Oropharynx is clear.  Eyes:     Conjunctiva/sclera: Conjunctivae normal.  Cardiovascular:     Rate and Rhythm: Normal rate and regular rhythm.     Heart sounds: Normal heart sounds.  Pulmonary:     Effort: Pulmonary effort is normal.     Breath sounds: Normal breath sounds.     Comments: Lungs clear to auscultation Musculoskeletal:     Cervical back: Neck supple.  Skin:    General: Skin is warm.  Neurological:     Mental Status: She is alert and oriented to person, place, and time.  Psychiatric:        Mood and Affect: Mood normal.        Behavior: Behavior normal.        Thought Content: Thought content normal.        Judgment: Judgment normal.     Diagnostics: FVC 1.68 L (64%), FEV1 1.41 L (68%).  Spirometry indicates possible restrictive defect.  Spirometry is improved from previous spirometry  Assessment and Plan: 1. Severe persistent asthma without  complication   2. Dyspnea on exertion   3. Adverse food reaction, initial encounter   4. Seasonal and perennial allergic rhinoconjunctivitis   5. Allergic conjunctivitis of both eyes   6. Gastroesophageal reflux disease, unspecified whether esophagitis present   7. Globus sensation     No orders of the defined types were placed in this encounter.   Patient Instructions  Asthma Consider discussing with your cardiologist about changing your Lisinopril to something different due to cough We will refer you to a lung doctor (pulmonologist) due to your restrictive lung disease/ shortness of breath with exertion. You may need further imaging of your lungs/ PFT. Continue montelukast 10 mg once a day to prevent cough or wheeze.  We will send in a refill Continue Breztri 2 puffs twice a day with a spacer to prevent cough or wheeze Continue albuterol 2 puffs every 4 hours as needed for cough or wheeze OR Instead use albuterol 0.083% solution via nebulizer one unit vial every 4 hours as needed for cough or wheeze Continue to avoid cigarette smoke Continue Fasenra injections every 4 weeks x 3 doses then go to every 8 weeks  Asthma control goals:  Full participation in all desired activities (may need albuterol before activity) Albuterol use two time or less a week on average (not counting use with activity) Cough interfering with sleep two time or less a month Oral steroids no more than once a year No hospitalizations   Allergic rhinitis Continue allergen avoidance measures directed toward dust mite, cockroach, weed pollen, cat, and dog Restart allergy injections. Make sure to carry your epinephrine auto-injector device with you on days you get your allergy injections Continue Nasacort 1 to 2 sprays in each nostril once a day as needed for nasal congestion. Continue  azelastine 2 sprays in each nostril up to twice a day  as needed for runny nose/drainage down throat. Consider using this more  frequently while you are having drainage Consider saline nasal rinses as needed for nasal symptoms. Use this before any medicated nasal sprays for best result. Increase saline rinses to twice a day for now Continue an antihistamine once a day as needed for runny nose or itch. Remember to rotate to a different antihistamine about every 3 months. Some examples of over the counter antihistamines include Zyrtec (cetirizine), Xyzal (levocetirizine), Allegra (fexofenadine), and Claritin (loratidine).   Allergic conjunctivitis Continue cromolyn 4% eyedrops.  Use 1 drop in each eye up to 4 times a day as needed for red, itchy, or watery eyes. May try over the counter Pataday or Zaditor eye drops to see if they do not sting/burn with use  Reflux Continue dietary and lifestyle modifications as listed below Continue pantoprazole twice a day  as previously prescribed Continue to follow up with GI  Adverse food reaction Avoid the chicken salad from [employer] along with the ingredients in it (red onion,chicken, almonds, cranberries) We will get lab work since you are currently on antihistamines.  We will call you with results once they are back.  Discussed checking her blood pressure at home and contacting her cardiologist due to decrease in blood pressure Follow up in 3 months with Dr. Maudie Mercury or sooner if needed.   Lifestyle Changes for Controlling GERD When you have GERD, stomach acid feels as if it's backing up toward your mouth. Whether or not you take medication to control your GERD, your symptoms can often be improved with lifestyle changes.   Raise Your Head Reflux is more likely to strike when you're lying down flat, because stomach fluid can flow backward more easily. Raising the head of your bed 4-6 inches can help. To do this: Slide blocks or books under the legs at the head of your bed. Or, place a wedge under the mattress. Many foam stores can make a suitable wedge for you. The wedge should  run from your waist to the top of your head. Don't just prop your head on several pillows. This increases pressure on your stomach. It can make GERD worse.  Watch Your Eating Habits Certain foods may increase the acid in your stomach or relax the lower esophageal sphincter, making GERD more likely. It's best to avoid the following: Coffee, tea, and carbonated drinks (with and without caffeine) Fatty, fried, or spicy food Mint, chocolate, onions, and tomatoes Any other foods that seem to irritate your stomach or cause you pain  Relieve the Pressure Eat smaller meals, even if you have to eat more often. Don't lie down right after you eat. Wait a few hours for your stomach to empty. Avoid tight belts and tight-fitting clothes. Lose excess weight.  Tobacco and Alcohol Avoid smoking tobacco and drinking alcohol. They can make GERD symptoms worse.  Reducing Pollen Exposure The American Academy of Allergy, Asthma and Immunology suggests the following steps to reduce your exposure to pollen during allergy seasons. Do not hang sheets or clothing out to dry; pollen may collect on these items. Do not mow lawns or spend time around freshly cut grass; mowing stirs up pollen. Keep windows closed at night.  Keep car windows closed while driving. Minimize morning activities outdoors, a time when pollen counts are usually at their highest. Stay indoors as much as possible when pollen counts or humidity is high and on windy days when pollen tends to remain in the air longer. Use air  conditioning when possible.  Many air conditioners have filters that trap the pollen spores. Use a HEPA room air filter to remove pollen form the indoor air you breathe.   Control of Dust Mite Allergen Dust mites play a major role in allergic asthma and rhinitis. They occur in environments with high humidity wherever human skin is found. Dust mites absorb humidity from the atmosphere (ie, they do not drink) and feed on  organic matter (including shed human and animal skin). Dust mites are a microscopic type of insect that you cannot see with the naked eye. High levels of dust mites have been detected from mattresses, pillows, carpets, upholstered furniture, bed covers, clothes, soft toys and any woven material. The principal allergen of the dust mite is found in its feces. A gram of dust may contain 1,000 mites and 250,000 fecal particles. Mite antigen is easily measured in the air during house cleaning activities. Dust mites do not bite and do not cause harm to humans, other than by triggering allergies/asthma.  Ways to decrease your exposure to dust mites in your home:  1. Encase mattresses, box springs and pillows with a mite-impermeable barrier or cover  2. Wash sheets, blankets and drapes weekly in hot water (130 F) with detergent and dry them in a dryer on the hot setting.  3. Have the room cleaned frequently with a vacuum cleaner and a damp dust-mop. For carpeting or rugs, vacuuming with a vacuum cleaner equipped with a high-efficiency particulate air (HEPA) filter. The dust mite allergic individual should not be in a room which is being cleaned and should wait 1 hour after cleaning before going into the room.  4. Do not sleep on upholstered furniture (eg, couches).  5. If possible removing carpeting, upholstered furniture and drapery from the home is ideal. Horizontal blinds should be eliminated in the rooms where the person spends the most time (bedroom, study, television room). Washable vinyl, roller-type shades are optimal.  6. Remove all non-washable stuffed toys from the bedroom. Wash stuffed toys weekly like sheets and blankets above.  7. Reduce indoor humidity to less than 50%. Inexpensive humidity monitors can be purchased at most hardware stores. Do not use a humidifier as can make the problem worse and are not recommended.  Control of Dog or Cat Allergen Avoidance is the best way to manage a  dog or cat allergy. If you have a dog or cat and are allergic to dog or cats, consider removing the dog or cat from the home. If you have a dog or cat but don't want to find it a new home, or if your family wants a pet even though someone in the household is allergic, here are some strategies that may help keep symptoms at bay:  Keep the pet out of your bedroom and restrict it to only a few rooms. Be advised that keeping the dog or cat in only one room will not limit the allergens to that room. Don't pet, hug or kiss the dog or cat; if you do, wash your hands with soap and water. High-efficiency particulate air (HEPA) cleaners run continuously in a bedroom or living room can reduce allergen levels over time. Regular use of a high-efficiency vacuum cleaner or a central vacuum can reduce allergen levels. Giving your dog or cat a bath at least once a week can reduce airborne allergen.  Control of Cockroach Allergen Cockroach allergen has been identified as an important cause of acute attacks of asthma, especially in urban  settings.  There are fifty-five species of cockroach that exist in the [place], however only three, the [place], [place] species produce allergen that can affect patients with Asthma.  Allergens can be obtained from fecal particles, egg casings and secretions from cockroaches.    Remove food sources. Reduce access to water. Seal access and entry points. Spray runways with 0.5-1% Diazinon or Chlorpyrifos Blow boric acid power under stoves and refrigerator. Place bait stations (hydramethylnon) at feeding sites.  Return in about 3 months (around 08/05/2022), or if symptoms worsen or fail to improve.    Thank you for the opportunity to care for this patient.  Please do not hesitate to contact me with questions.  Althea Charon, FNP [facility]

## 2022-05-05 NOTE — Progress Notes (Signed)
Immunotherapy   Patient Details  Name: Katrina Vega MRN: [id number] Date of Birth: [id number]  [id number]  [name] started injections for  W-C-D,MOLDS-DM-CR Following schedule: A  Frequency:1 time per week Epi-Pen:Epi-Pen Available  Consent signed and patient instructions given. Patient restarted allergy injections today, she received 0.28m of W-C-D in the [facility] and 0.059mof MOLDS-DM-CR [place] waited 30 minutes in office and did not experience any issues.    [name] 05/05/2022, 12:04 PM

## 2022-05-07 ENCOUNTER — Telehealth: Payer: Self-pay

## 2022-05-07 NOTE — Telephone Encounter (Signed)
Katrina Vega has been referred to [facility] two days ago.  It was an internal referral to [facility] and they will reach out to her to schedule.  I sent Waylyn a [employer] after she called [facility] and was really rude to everyone she spoke to.  I explained her referral was sent 2 days ago and they will reach out to her.  I told her if she would like to expedite the referral and not wait on [facility] to call her, she was more than welcomed to call them.

## 2022-05-07 NOTE — Telephone Encounter (Signed)
Please refer patient to pulmonology for restrictive lung disease/ shortness of breath with exertion. Per Quita Skye.     Patient called wanting the pulmonology office information, and I did inform her that the front office is working on referrals as fast as they can and understand the importance of them for all of our patients. Patient stated she would like a call back as soon as possible because her

## 2022-05-08 ENCOUNTER — Ambulatory Visit (INDEPENDENT_AMBULATORY_CARE_PROVIDER_SITE_OTHER): Payer: 59 | Admitting: Pulmonary Disease

## 2022-05-08 ENCOUNTER — Encounter: Payer: Self-pay | Admitting: Pulmonary Disease

## 2022-05-08 VITALS — BP 100/60 | HR 78 | Ht 65.0 in | Wt 242.6 lb

## 2022-05-08 DIAGNOSIS — Z8616 Personal history of COVID-19: Secondary | ICD-10-CM | POA: Diagnosis not present

## 2022-05-08 DIAGNOSIS — R0609 Other forms of dyspnea: Secondary | ICD-10-CM

## 2022-05-08 DIAGNOSIS — R0602 Shortness of breath: Secondary | ICD-10-CM

## 2022-05-08 NOTE — Telephone Encounter (Signed)
Thank you for your work [employer]

## 2022-05-08 NOTE — Patient Instructions (Addendum)
Thank you for visiting Dr. Valeta Harms at [facility]. Today we recommend the following:  Orders Placed This Encounter  Procedures   CT CHEST HIGH RESOLUTION   Pulmonary Function Test   Return in about 6 weeks (around 06/19/2022) for with APP or Dr. Valeta Harms. After HRCT and PFTs     Please do your part to reduce the spread of COVID-19.

## 2022-05-08 NOTE — Progress Notes (Signed)
Synopsis: Referred in March 2024 for asthma on fasenra by Carollee Herter, Alferd Apa, *  Subjective:   PATIENT ID: Katrina Vega GENDER: female DOB: [birth year], MRN: [id number]  Chief Complaint  Patient presents with   Consult    SOB with exertion     This is a 67 year old female, coronary disease, history of heart failure diastolic heart failure, CKD 3, COPD/asthma.  History of hyperlipidemia patient was seen today for evaluation of shortness of breath.  She has a BMI of 40.  She is currently managed with her asthma from the allergy center.  On inhalers as well as Berna Bue.  Her PFTs completed there showed a reduction in her FEV1 and FVC but a normal ratio.  Felt to have possible underlying restriction.  She does have a history of COVID.  No occupational exposures.  No history of rheumatologic diseases    Past Medical History:  Diagnosis Date   Asthma    CAD S/P two-vessel DES PCI 2008   LAD and RI; 2013 PTCA of small RCA.   CHF (congestive heart failure), NYHA class I, chronic, diastolic ([place]) [id number]   Recent Echo 12/16/2021: Mild concentric LVH.  EF 59%.  GI 1 DD.  Normal LAP-(Mildly dilated LA;; has converted from to torsemide and now on bumetanide   CKD stage 3 due to type 2 diabetes mellitus (HCC)    COPD (chronic obstructive pulmonary disease) ([place]) [id number]   Complicated by asthma and seasonal allergies.   Diabetes mellitus, type II, insulin dependent ([place]) 2010   On insulin 60 units TID Premeal.  Also on Jardiance and Ozempic.   Eczema    Essential hypertension    Heart attack [facility]) 2008   2008-two-vessel PCI; 2013 PTCA only small RCA   History of left hip replacement 04/2018   Hyperlipidemia associated with type 2 diabetes mellitus (HCC)    140 mg rosuvastatin   Insulin pump in place    PAD (peripheral artery disease) ([place])    Status post bilateral SFA stents and right posterior tibial DES stent   Primary hypertension 01/20/2011      Patient's blood pressure is well  controlled.  Continue current medications.     Family History  Problem Relation Age of Onset   Heart disease Mother    Breast cancer Mother    Heart attack Father    Lung cancer Father    Eczema Grandson    Allergic rhinitis Neg Hx    Angioedema Neg Hx    Asthma Neg Hx    Atopy Neg Hx    Immunodeficiency Neg Hx    Urticaria Neg Hx      Past Surgical History:  Procedure Laterality Date   AAA DUPLEX  10/09/2020   Max Aorta (sac) diameter 2.51 cm prox with mild ectasia in Prox Aorta. Diffuse plaque in mid-distal Aorta. Bilateral ICA poorly visulailzed - appear to be Severely stenosed with difuse plaque. - Consider CTA of cath directed Angio.   APPENDECTOMY  1974   BIOPSY  02/22/2021   Procedure: BIOPSY;  Surgeon: Carol Ada, MD;  Location: [facility];  Service: Endoscopy;;   CARPAL TUNNEL RELEASE  2017   COLONOSCOPY WITH PROPOFOL N/A 02/22/2021   Procedure: COLONOSCOPY WITH PROPOFOL;  Surgeon: Carol Ada, MD;  Location: [facility];  Service: Endoscopy;  Laterality: N/A;   CORONARY BALLOON ANGIOPLASTY  2013   PTCA of RCA followed by PCI   CORONARY STENT INTERVENTION  2008   Text DES PCI to LAD and  RI/OM1; also prox RCA   ESOPHAGOGASTRODUODENOSCOPY (EGD) WITH PROPOFOL N/A 02/22/2021   Procedure: ESOPHAGOGASTRODUODENOSCOPY (EGD) WITH PROPOFOL;  Surgeon: Carol Ada, MD;  Location: [facility];  Service: Endoscopy;  Laterality: N/A;   LEA Dopplers  10/09/2020   R mid & Distal SFA -CTO - dampend flow in R Pop via collaterals - Moderate velocity increase in R PFA. No signficant stenosis in LLE.   R ABI 0.97) - normal. L ABI - 0.91 w/ monophasic flow in L AT -> c/w 11/2019- R SFA new.   LEFT HEART CATH AND CORONARY ANGIOGRAPHY  12/22/2017   Mild LM plaque.  Mild proximal LAD plaque.  High D1-40% ostial.  Patent mid LAD DES (Taxus from 2008), large distal LAD free disease; Prox LCx-OM1 stents patent (Taxus 2008). Small RCA - patent prox stent(~50-60% ISR), PTCA site from 2013 -  patent   LOWER EXTREMITY ANGIOGRAM Bilateral 12/22/2017   Bilateral SFA stents with evidence of severe right and mid left ISR bilateral popliteal arteries proximal trifurcation vessels are patent.  Moderate to severe lesion involving mid R AntTib, poor distal flow in L Ant Tib - 2 V runoff Bilat. --> referred for R SFA Laser Atherectomy & DCB 5 x 120 mm.   LOWER EXTREMITY INTERVENTION Right 12/22/2017   Laser atherectomy of right SFA followed by [facility] with 5.0 x 120 mm impact.-For severe right SFA ISR   LUMBAR SPINE SURGERY  2010   NM MYOVIEW LTD  07/07/2019   [facility]): Lexiscan.  Nondiagnostic EKG.  Dyspnea with effusion.  No ischemia or infarction.  Soft tissue attenuation noted.Marland Kitchen  LVEF 72%.  No RWMA.  LOW RISK.--No change from 11/2017   POLYPECTOMY  02/22/2021   Procedure: POLYPECTOMY;  Surgeon: Carol Ada, MD;  Location: [facility];  Service: Endoscopy;;   REPLACEMENT TOTAL KNEE  2017 and 2018   TONSILLECTOMY     TOTAL ABDOMINAL HYSTERECTOMY  1989   TOTAL HIP ARTHROPLASTY  04/2018   TOTAL SHOULDER ARTHROPLASTY  11/2019   TRANSTHORACIC ECHOCARDIOGRAM  07/04/2019   Normal LV size, mild LVH, hyperdynamic LVEF at >65% with grade 1 diastolic dysfunction.  Otherwise no other significant abnormality.  Poor quality due to patient body habitus.   TRANSTHORACIC ECHOCARDIOGRAM  12/16/2021   [facility]) normal LV size and function.  Moderate concentric LVH.  Normal WM.  EF estimated 59%.  GR 1 DD.  Mild LA dilation.  No valvular lesions.    Social History   Socioeconomic History   Marital status: Single    Spouse name: Not on file   Number of children: 5   Years of education: Not on file   Highest education level: Not on file  Occupational History   Not on file  Tobacco Use   Smoking status: Former    Packs/day: 0.50    Years: 15.00    Additional pack years: 0.00    Total pack years: 7.50    Types: Cigarettes    Quit date:  10/14/1995    Years since quitting: 26.5   Smokeless tobacco: Never  Vaping Use   Vaping Use: Never used  Substance and Sexual Activity   Alcohol use: Not Currently   Drug use: Not Currently   Sexual activity: Yes  Other Topics Concern   Not on file  Social History Narrative   Not on file   Social Determinants of Health   Financial Resource Strain: Low Risk  (07/16/2021)   Overall Financial Resource Strain (CARDIA)  Difficulty of Paying Living Expenses: Not hard at all  Food Insecurity: No Food Insecurity (07/16/2021)   Hunger Vital Sign    Worried About Running Out of Food in the Last Year: Never true    Ran Out of Food in the Last Year: Never true  Transportation Needs: No Transportation Needs (07/16/2021)   PRAPARE - Hydrologist (Medical): No    Lack of Transportation (Non-Medical): No  Physical Activity: Inactive (07/16/2021)   Exercise Vital Sign    Days of Exercise per Week: 0 days    Minutes of Exercise per Session: 0 min  Stress: No Stress Concern Present (07/16/2021)   [place]    Feeling of Stress : Only a little  Social Connections: Moderately Isolated (07/16/2021)   Social Connection and Isolation Panel [NHANES]    Frequency of Communication with Friends and Family: More than three times a week    Frequency of Social Gatherings with Friends and Family: Never    Attends Religious Services: More than 4 times per year    Active Member of [employer] or Organizations: No    Attends Archivist Meetings: Never    Marital Status: Never married  Intimate Partner Violence: Not At Risk (07/16/2021)   Humiliation, Afraid, Rape, and Kick questionnaire    Fear of Current or Ex-Partner: No    Emotionally Abused: No    Physically Abused: No    Sexually Abused: No     Allergies  Allergen Reactions   Contrast Media [Iodinated Contrast Media] Shortness Of Breath and Other (See  Comments)    sob, chest tight   Morphine Itching   Statins Diarrhea and Other (See Comments)    nausea, diarrhea, myalgias   Ioversol      Outpatient Medications Prior to Visit  Medication Sig Dispense Refill   albuterol (PROVENTIL) (2.5 MG/3ML) 0.083% nebulizer solution Take 3 mLs (2.5 mg total) by nebulization every 6 (six) hours as needed for wheezing or shortness of breath. 75 mL 2   albuterol (VENTOLIN HFA) 108 (90 Base) MCG/ACT inhaler Inhale 2 puffs into the lungs every 6 (six) hours as needed for wheezing. 18 g 1   BD AUTOSHIELD DUO 30G X 5 MM MISC 1 KIT BY MISC ROUTE 3 TIMES DAILY BEFORE MEALS     benzonatate (TESSALON) 200 MG capsule Take 1 capsule (200 mg total) by mouth 2 (two) times daily as needed for cough. 20 capsule 0   Budeson-Glycopyrrol-Formoterol (BREZTRI AEROSPHERE) 160-9-4.8 MCG/ACT AERO Inhale two puffs twice daily to prevent cough or wheeze.  Rinse, gargle, and spit after use. 32.1 g 1   bumetanide (BUMEX) 1 MG tablet TAKE 1 TABLET(1 MG) BY MOUTH DAILY 30 tablet 0   cetirizine (ZYRTEC) 10 MG tablet Take 1 tablet by mouth daily.     Cholecalciferol 50 MCG (2000 UT) TABS Take 2,000 Units by mouth daily.     clopidogrel (PLAVIX) 75 MG tablet Take 1 tablet (75 mg total) by mouth daily. 90 tablet 3   Continuous Blood Gluc Sensor (DEXCOM G6 SENSOR) MISC USE 1 SENSOR AS NEEDED CHANGE EVERY 10 DAYS     Continuous Blood Gluc Transmit (DEXCOM G6 TRANSMITTER) MISC      cromolyn (OPTICROM) 4 % ophthalmic solution Place 1 drop into both eyes 4 (four) times daily. INSTILL 1 DROP INTO BOTH EYES UP TO 4 TIMES DAILY AS NEEDED FOR  RED, ITCHY, OR WATERY EYES 60 mL 1  empagliflozin (JARDIANCE) 10 MG TABS tablet Take 1 tablet (10 mg total) by mouth daily before breakfast. 30 tablet 3   EPINEPHrine (EPIPEN 2-PAK) 0.3 mg/0.3 mL IJ SOAJ injection Use as directed for severe allergic reactions 2 each 3   FASENRA PEN 30 MG/ML SOAJ Inject into the skin.     gabapentin (NEURONTIN) 300 MG  capsule TAKE 1 CAPSULE BY MOUTH 3 TIMES  DAILY 270 capsule 1   glucagon 1 MG injection Inject 1 mg into the muscle once as needed (low blood sugar).     glucose 4 GM chewable tablet Chew 1 tablet by mouth once as needed for low blood sugar.     HUMALOG 100 UNIT/ML injection Inject into the skin.     HUMULIN N 100 UNIT/ML injection Inject 60 Units into the skin 3 (three) times daily before meals.     Insulin Disposable Pump (OMNIPOD 5 G6 INTRO, GEN 5,) KIT CHANGE POD EVERY 2 DAYS     isosorbide mononitrate (IMDUR) 60 MG 24 hr tablet Take 1 tablet (60 mg total) by mouth daily. 90 tablet 3   levocetirizine (XYZAL) 5 MG tablet TAKE 1 TABLET BY MOUTH EVERY  EVENING IF NEEDED 100 tablet 3   lisinopril (ZESTRIL) 20 MG tablet Take 1 tablet (20 mg total) by mouth daily. 90 tablet 3   methocarbamol (ROBAXIN) 500 MG tablet Take 1 tablet (500 mg total) by mouth every 8 (eight) hours as needed for muscle spasms. 20 tablet 0   metoprolol tartrate (LOPRESSOR) 50 MG tablet Take 1 tablet (50 mg total) by mouth every 12 (twelve) hours. 180 tablet 3   montelukast (SINGULAIR) 10 MG tablet TAKE 1 TABLET BY MOUTH AT  BEDTIME 100 tablet 1   nitroGLYCERIN (NITROSTAT) 0.4 MG SL tablet Place 1 tablet (0.4 mg total) under the tongue every 5 (five) minutes as needed for chest pain. 25 tablet 3   OZEMPIC, 2 MG/DOSE, 8 MG/3ML SOPN Inject 2 mg into the skin once a week.     pantoprazole (PROTONIX) 40 MG tablet TAKE 1 TABLET BY MOUTH IN THE  MORNING AND AT BEDTIME (Patient taking differently: Take 40 mg by mouth 2 (two) times daily.) 200 tablet 2   potassium chloride (KLOR-CON) 10 MEQ tablet Take 1 tablet (10 mEq total) by mouth daily. 90 tablet 3   Respiratory Therapy Supplies (CARETOUCH 2 CPAP HOSE HANGER) MISC by Does not apply route.     rosuvastatin (CRESTOR) 40 MG tablet Take 1 tablet (40 mg total) by mouth at bedtime. 90 tablet 3   silver sulfADIAZINE (SILVADENE) 1 % cream Apply 1 Application topically daily. 50 g 0   No  facility-administered medications prior to visit.    Review of Systems  Constitutional:  Negative for chills, fever, malaise/fatigue and weight loss.  HENT:  Negative for hearing loss, sore throat and tinnitus.   Eyes:  Negative for blurred vision and double vision.  Respiratory:  Positive for shortness of breath. Negative for cough, hemoptysis, sputum production, wheezing and stridor.   Cardiovascular:  Negative for chest pain, palpitations, orthopnea, leg swelling and PND.  Gastrointestinal:  Negative for abdominal pain, constipation, diarrhea, heartburn, nausea and vomiting.  Genitourinary:  Negative for dysuria, hematuria and urgency.  Musculoskeletal:  Negative for joint pain and myalgias.  Skin:  Negative for itching and rash.  Neurological:  Negative for dizziness, tingling, weakness and headaches.  Endo/Heme/Allergies:  Negative for environmental allergies. Does not bruise/bleed easily.  Psychiatric/Behavioral:  Negative for depression. The patient is  not nervous/anxious and does not have insomnia.   All other systems reviewed and are negative.    Objective:  Physical Exam Vitals reviewed.  Constitutional:      General: She is not in acute distress.    Appearance: She is well-developed. She is obese.  HENT:     Head: Normocephalic and atraumatic.  Eyes:     General: No scleral icterus.    Conjunctiva/sclera: Conjunctivae normal.     Pupils: Pupils are equal, round, and reactive to light.  Neck:     Vascular: No JVD.     Trachea: No tracheal deviation.  Cardiovascular:     Rate and Rhythm: Normal rate and regular rhythm.     Heart sounds: Normal heart sounds. No murmur heard. Pulmonary:     Effort: Pulmonary effort is normal. No tachypnea, accessory muscle usage or respiratory distress.     Breath sounds: No stridor. No wheezing, rhonchi or rales.     Comments: Distant breath sounds, obese. Abdominal:     Tenderness: There is no abdominal tenderness.   Musculoskeletal:        General: No tenderness.     Cervical back: Neck supple.  Lymphadenopathy:     Cervical: No cervical adenopathy.  Skin:    General: Skin is warm and dry.     Capillary Refill: Capillary refill takes less than 2 seconds.     Findings: No rash.  Neurological:     Mental Status: She is alert and oriented to person, place, and time.  Psychiatric:        Behavior: Behavior normal.      Vitals:   05/08/22 1528  BP: 100/60  Pulse: 78  SpO2: 95%  Weight: 242 lb 9.6 oz (110 kg)  Height: '5\' 5"'$  (1.651 m)   95% on RA BMI Readings from Last 3 Encounters:  05/08/22 40.37 kg/m  05/05/22 40.50 kg/m  03/24/22 40.70 kg/m   Wt Readings from Last 3 Encounters:  05/08/22 242 lb 9.6 oz (110 kg)  05/05/22 243 lb 6.4 oz (110.4 kg)  03/24/22 244 lb 9.6 oz (110.9 kg)     CBC    Component Value Date/Time   WBC 6.9 03/24/2022 1408   RBC 4.66 03/24/2022 1408   HGB 12.4 03/24/2022 1408   HGB 12.3 01/24/2022 1055   HCT 38.9 03/24/2022 1408   PLT 320.0 03/24/2022 1408   PLT 237 01/24/2022 1055   MCV 83.4 03/24/2022 1408   MCH 25.9 (L) 01/24/2022 1055   MCHC 31.9 03/24/2022 1408   RDW 16.7 (H) 03/24/2022 1408   LYMPHSABS 2.8 03/24/2022 1408   MONOABS 0.5 03/24/2022 1408   EOSABS 0.0 03/24/2022 1408   BASOSABS 0.1 03/24/2022 1408     Chest Imaging:  No recent axial CT imaging.  October 2023 chest x-ray: Unchanged ill-defined opacities in the right base, likely atelectasis The patient's images have been independently reviewed by me.    Pulmonary Functions Testing Results:     No data to display          FeNO:   Pathology:   Echocardiogram:   Heart Catheterization:     Assessment & Plan:     ICD-10-CM   1. SOB (shortness of breath)  R06.02 Pulmonary Function Test    CT CHEST HIGH RESOLUTION    2. DOE (dyspnea on exertion)  R06.09 CT CHEST HIGH RESOLUTION    3. History of COVID-19  Z86.16       Discussion:  67 year old female  seen today for dyspnea on exertion shortness of breath.  History of COVID-19.  PFTs with reduced FEV1 and FVC, normal ratio.  No rheumatologic diseases she does have chronic diastolic heart failure.  Does not routinely take her Lasix every day.  Plan: Will start by evaluating her lung parenchyma with HRCT to rule out ILD. Complete full set of PFTs to include lung volumes and DLCO. This also include repeat spirometry. Being upon the results of the studies will consider neck step in evaluation. However I suspect her restriction is likely related to body habitus. Encouraged her to continue stay active and work on weight loss. She can continue her current triple therapy inhaler regimen plus Fasenra for asthma management and follow-up with asthma and allergy.  We appreciate the referral.   Current Outpatient Medications:    albuterol (PROVENTIL) (2.5 MG/3ML) 0.083% nebulizer solution, Take 3 mLs (2.5 mg total) by nebulization every 6 (six) hours as needed for wheezing or shortness of breath., Disp: 75 mL, Rfl: 2   albuterol (VENTOLIN HFA) 108 (90 Base) MCG/ACT inhaler, Inhale 2 puffs into the lungs every 6 (six) hours as needed for wheezing., Disp: 18 g, Rfl: 1   BD AUTOSHIELD DUO 30G X 5 MM MISC, 1 KIT BY MISC ROUTE 3 TIMES DAILY BEFORE MEALS, Disp: , Rfl:    benzonatate (TESSALON) 200 MG capsule, Take 1 capsule (200 mg total) by mouth 2 (two) times daily as needed for cough., Disp: 20 capsule, Rfl: 0   Budeson-Glycopyrrol-Formoterol (BREZTRI AEROSPHERE) 160-9-4.8 MCG/ACT AERO, Inhale two puffs twice daily to prevent cough or wheeze.  Rinse, gargle, and spit after use., Disp: 32.1 g, Rfl: 1   bumetanide (BUMEX) 1 MG tablet, TAKE 1 TABLET(1 MG) BY MOUTH DAILY, Disp: 30 tablet, Rfl: 0   cetirizine (ZYRTEC) 10 MG tablet, Take 1 tablet by mouth daily., Disp: , Rfl:    Cholecalciferol 50 MCG (2000 UT) TABS, Take 2,000 Units by mouth daily., Disp: , Rfl:    clopidogrel (PLAVIX) 75 MG tablet, Take 1  tablet (75 mg total) by mouth daily., Disp: 90 tablet, Rfl: 3   Continuous Blood Gluc Sensor (DEXCOM G6 SENSOR) MISC, USE 1 SENSOR AS NEEDED CHANGE EVERY 10 DAYS, Disp: , Rfl:    Continuous Blood Gluc Transmit (DEXCOM G6 TRANSMITTER) MISC, , Disp: , Rfl:    cromolyn (OPTICROM) 4 % ophthalmic solution, Place 1 drop into both eyes 4 (four) times daily. INSTILL 1 DROP INTO BOTH EYES UP TO 4 TIMES DAILY AS NEEDED FOR  RED, ITCHY, OR WATERY EYES, Disp: 60 mL, Rfl: 1   empagliflozin (JARDIANCE) 10 MG TABS tablet, Take 1 tablet (10 mg total) by mouth daily before breakfast., Disp: 30 tablet, Rfl: 3   EPINEPHrine (EPIPEN 2-PAK) 0.3 mg/0.3 mL IJ SOAJ injection, Use as directed for severe allergic reactions, Disp: 2 each, Rfl: 3   FASENRA PEN 30 MG/ML SOAJ, Inject into the skin., Disp: , Rfl:    gabapentin (NEURONTIN) 300 MG capsule, TAKE 1 CAPSULE BY MOUTH 3 TIMES  DAILY, Disp: 270 capsule, Rfl: 1   glucagon 1 MG injection, Inject 1 mg into the muscle once as needed (low blood sugar)., Disp: , Rfl:    glucose 4 GM chewable tablet, Chew 1 tablet by mouth once as needed for low blood sugar., Disp: , Rfl:    HUMALOG 100 UNIT/ML injection, Inject into the skin., Disp: , Rfl:    HUMULIN N 100 UNIT/ML injection, Inject 60 Units into the skin 3 (three) times daily  before meals., Disp: , Rfl:    Insulin Disposable Pump (OMNIPOD 5 G6 INTRO, GEN 5,) KIT, CHANGE POD EVERY 2 DAYS, Disp: , Rfl:    isosorbide mononitrate (IMDUR) 60 MG 24 hr tablet, Take 1 tablet (60 mg total) by mouth daily., Disp: 90 tablet, Rfl: 3   levocetirizine (XYZAL) 5 MG tablet, TAKE 1 TABLET BY MOUTH EVERY  EVENING IF NEEDED, Disp: 100 tablet, Rfl: 3   lisinopril (ZESTRIL) 20 MG tablet, Take 1 tablet (20 mg total) by mouth daily., Disp: 90 tablet, Rfl: 3   methocarbamol (ROBAXIN) 500 MG tablet, Take 1 tablet (500 mg total) by mouth every 8 (eight) hours as needed for muscle spasms., Disp: 20 tablet, Rfl: 0   metoprolol tartrate (LOPRESSOR) 50 MG  tablet, Take 1 tablet (50 mg total) by mouth every 12 (twelve) hours., Disp: 180 tablet, Rfl: 3   montelukast (SINGULAIR) 10 MG tablet, TAKE 1 TABLET BY MOUTH AT  BEDTIME, Disp: 100 tablet, Rfl: 1   nitroGLYCERIN (NITROSTAT) 0.4 MG SL tablet, Place 1 tablet (0.4 mg total) under the tongue every 5 (five) minutes as needed for chest pain., Disp: 25 tablet, Rfl: 3   OZEMPIC, 2 MG/DOSE, 8 MG/3ML SOPN, Inject 2 mg into the skin once a week., Disp: , Rfl:    pantoprazole (PROTONIX) 40 MG tablet, TAKE 1 TABLET BY MOUTH IN THE  MORNING AND AT BEDTIME (Patient taking differently: Take 40 mg by mouth 2 (two) times daily.), Disp: 200 tablet, Rfl: 2   potassium chloride (KLOR-CON) 10 MEQ tablet, Take 1 tablet (10 mEq total) by mouth daily., Disp: 90 tablet, Rfl: 3   Respiratory Therapy Supplies (CARETOUCH 2 CPAP HOSE HANGER) MISC, by Does not apply route., Disp: , Rfl:    rosuvastatin (CRESTOR) 40 MG tablet, Take 1 tablet (40 mg total) by mouth at bedtime., Disp: 90 tablet, Rfl: 3   silver sulfADIAZINE (SILVADENE) 1 % cream, Apply 1 Application topically daily., Disp: 50 g, Rfl: 0  Garner Nash, DO [facility] 05/08/2022 3:54 PM

## 2022-05-09 ENCOUNTER — Ambulatory Visit (INDEPENDENT_AMBULATORY_CARE_PROVIDER_SITE_OTHER): Payer: 59 | Admitting: Family Medicine

## 2022-05-09 VITALS — BP 110/80 | HR 81 | Temp 98.3°F | Resp 20 | Ht 65.0 in | Wt 242.8 lb

## 2022-05-09 DIAGNOSIS — N1832 Chronic kidney disease, stage 3b: Secondary | ICD-10-CM | POA: Diagnosis not present

## 2022-05-09 DIAGNOSIS — M25552 Pain in left hip: Secondary | ICD-10-CM | POA: Diagnosis not present

## 2022-05-09 DIAGNOSIS — G8929 Other chronic pain: Secondary | ICD-10-CM | POA: Diagnosis not present

## 2022-05-09 DIAGNOSIS — J42 Unspecified chronic bronchitis: Secondary | ICD-10-CM | POA: Diagnosis not present

## 2022-05-09 DIAGNOSIS — E1122 Type 2 diabetes mellitus with diabetic chronic kidney disease: Secondary | ICD-10-CM

## 2022-05-09 DIAGNOSIS — N2581 Secondary hyperparathyroidism of renal origin: Secondary | ICD-10-CM

## 2022-05-09 DIAGNOSIS — Z794 Long term (current) use of insulin: Secondary | ICD-10-CM

## 2022-05-09 DIAGNOSIS — I5043 Acute on chronic combined systolic (congestive) and diastolic (congestive) heart failure: Secondary | ICD-10-CM

## 2022-05-09 DIAGNOSIS — D689 Coagulation defect, unspecified: Secondary | ICD-10-CM

## 2022-05-09 DIAGNOSIS — I25118 Atherosclerotic heart disease of native coronary artery with other forms of angina pectoris: Secondary | ICD-10-CM | POA: Diagnosis not present

## 2022-05-09 LAB — IGE NUT PROF. W/COMPONENT RFLX
F017-IgE Hazelnut (Filbert): <0.1 kU/L
F018-IgE Brazil Nut: <0.1 kU/L
F020-IgE Almond: <0.1 kU/L
F202-IgE Cashew Nut: <0.1 kU/L
F203-IgE Pistachio Nut: <0.1 kU/L
F256-IgE Walnut: <0.1 kU/L
Macadamia Nut, IgE: <0.1 kU/L
Peanut, IgE: <0.1 kU/L
Pecan Nut IgE: <0.1 kU/L

## 2022-05-09 LAB — ALLERGEN, CHICKEN F83: Chicken IgE: <0.1 kU/L

## 2022-05-09 LAB — F341-IGE CRANBERRY: Cranberry IgE: <0.1 kU/L

## 2022-05-09 LAB — ALLERGEN, ONION, F48: Allergen Onion IgE: <0.1 kU/L

## 2022-05-09 NOTE — Progress Notes (Signed)
Subjective:   By signing my name below, I, Katrina Vega, attest that this documentation has been prepared under the direction and in the presence of Ann Held, DO [id number]   Patient ID: Katrina Vega, female    DOB: 08/19/1955, 67 y.o.   MRN: [id number]  Chief Complaint  Patient presents with   Diabetes    Pt states needing diabetic foot exam.     HPI Patient is in today for an office visit.   She reports that she is currently trying to get shoes fitted for diabetes. She has already gotten sized by her podiatrist, Dr. Eston Esters. She received an injection in her right foot. She endorses pain while walking due to bilateral callus on her feet. She at times tries to soften the callus on her feet.  She complains of pain in her right groin area. She also endorses soreness and lower back pain. She states that pain occurs while making certain movements like lifting up her right leg.   She also endorses shortness of breath with exertion. She has been following up with pulmonary and had an appointment with them yesterday. She will soon be receiving a third round of injection for her asthma. She is scheduled to have a CT scan and lung testing within the next month.   Past Medical History:  Diagnosis Date   Asthma    CAD S/P two-vessel DES PCI 2008   LAD and RI; 2013 PTCA of small RCA.   CHF (congestive heart failure), NYHA class I, chronic, diastolic ([place]) [id number]   Recent Echo 12/16/2021: Mild concentric LVH.  EF 59%.  GI 1 DD.  Normal LAP-(Mildly dilated LA;; has converted from to torsemide and now on bumetanide   CKD stage 3 due to type 2 diabetes mellitus (HCC)    COPD (chronic obstructive pulmonary disease) ([place]) 123XX123   Complicated by asthma and seasonal allergies.   Diabetes mellitus, type II, insulin dependent ([place]) 2010   On insulin 60 units TID Premeal.  Also on Jardiance and Ozempic.   Eczema    Essential hypertension    Heart attack [facility]) 2008   2008-two-vessel PCI;  2013 PTCA only small RCA   History of left hip replacement 04/2018   Hyperlipidemia associated with type 2 diabetes mellitus (HCC)    140 mg rosuvastatin   Insulin pump in place    PAD (peripheral artery disease) ([place])    Status post bilateral SFA stents and right posterior tibial DES stent   Primary hypertension 01/20/2011      Patient's blood pressure is well controlled.  Continue current medications.    Past Surgical History:  Procedure Laterality Date   AAA DUPLEX  10/09/2020   Max Aorta (sac) diameter 2.51 cm prox with mild ectasia in Prox Aorta. Diffuse plaque in mid-distal Aorta. Bilateral ICA poorly visulailzed - appear to be Severely stenosed with difuse plaque. - Consider CTA of cath directed Angio.   APPENDECTOMY  1974   BIOPSY  02/22/2021   Procedure: BIOPSY;  Surgeon: Carol Ada, MD;  Location: [facility];  Service: Endoscopy;;   CARPAL TUNNEL RELEASE  2017   COLONOSCOPY WITH PROPOFOL N/A 02/22/2021   Procedure: COLONOSCOPY WITH PROPOFOL;  Surgeon: Carol Ada, MD;  Location: [facility];  Service: Endoscopy;  Laterality: N/A;   CORONARY BALLOON ANGIOPLASTY  2013   PTCA of RCA followed by PCI   CORONARY STENT INTERVENTION  2008   Text DES PCI to LAD and RI/OM1; also prox RCA  ESOPHAGOGASTRODUODENOSCOPY (EGD) WITH PROPOFOL N/A 02/22/2021   Procedure: ESOPHAGOGASTRODUODENOSCOPY (EGD) WITH PROPOFOL;  Surgeon: Carol Ada, MD;  [facility];  Service: Endoscopy;  Laterality: N/A;   LEA Dopplers  10/09/2020   R mid & Distal SFA -CTO - dampend flow in R Pop via collaterals - Moderate velocity increase in R PFA. No signficant stenosis in LLE.   R ABI 0.97) - normal. L ABI - 0.91 w/ monophasic flow in L AT -> c/w 11/2019- R SFA new.   LEFT HEART CATH AND CORONARY ANGIOGRAPHY  12/22/2017   Mild LM plaque.  Mild proximal LAD plaque.  High D1-40% ostial.  Patent mid LAD DES (Taxus from 2008), large distal LAD free disease; Prox LCx-OM1 stents patent (Taxus 2008).  Small RCA - patent prox stent(~50-60% ISR), PTCA site from 2013 - patent   LOWER EXTREMITY ANGIOGRAM Bilateral 12/22/2017   Bilateral SFA stents with evidence of severe right and mid left ISR bilateral popliteal arteries proximal trifurcation vessels are patent.  Moderate to severe lesion involving mid R AntTib, poor distal flow in L Ant Tib - 2 V runoff Bilat. --> referred for R SFA Laser Atherectomy & DCB 5 x 120 mm.   LOWER EXTREMITY INTERVENTION Right 12/22/2017   Laser atherectomy of right SFA followed by [facility] with 5.0 x 120 mm impact.-For severe right SFA ISR   LUMBAR SPINE SURGERY  2010   NM MYOVIEW LTD  07/07/2019   [facility]): Lexiscan.  Nondiagnostic EKG.  Dyspnea with effusion.  No ischemia or infarction.  Soft tissue attenuation noted.Marland Kitchen  LVEF 72%.  No RWMA.  LOW RISK.--No change from 11/2017   POLYPECTOMY  02/22/2021   Procedure: POLYPECTOMY;  Surgeon: Carol Ada, MD;  [facility]: [facility];  Service: Endoscopy;;   REPLACEMENT TOTAL KNEE  2017 and 2018   TONSILLECTOMY     TOTAL ABDOMINAL HYSTERECTOMY  1989   TOTAL HIP ARTHROPLASTY  04/2018   TOTAL SHOULDER ARTHROPLASTY  11/2019   TRANSTHORACIC ECHOCARDIOGRAM  07/04/2019   Normal LV size, mild LVH, hyperdynamic LVEF at >65% with grade 1 diastolic dysfunction.  Otherwise no other significant abnormality.  Poor quality due to patient body habitus.   TRANSTHORACIC ECHOCARDIOGRAM  12/16/2021   [facility]) normal LV size and function.  Moderate concentric LVH.  Normal WM.  EF estimated 59%.  GR 1 DD.  Mild LA dilation.  No valvular lesions.    Family History  Problem Relation Age of Onset   Heart disease Mother    Breast cancer Mother    Heart attack Father    Lung cancer Father    Eczema Grandson    Allergic rhinitis Neg Hx    Angioedema Neg Hx    Asthma Neg Hx    Atopy Neg Hx    Immunodeficiency Neg Hx    Urticaria Neg Hx     Social History   Socioeconomic History    Marital status: Single    Spouse name: Not on file   Number of children: 5   Years of education: Not on file   Highest education level: Not on file  Occupational History   Not on file  Tobacco Use   Smoking status: Former    Packs/day: 0.50    Years: 15.00    Additional pack years: 0.00    Total pack years: 7.50    Types: Cigarettes    Quit date: 10/14/1995    Years since quitting: 26.5   Smokeless tobacco: Never  Vaping Use  Vaping Use: Never used  Substance and Sexual Activity   Alcohol use: Not Currently   Drug use: Not Currently   Sexual activity: Yes  Other Topics Concern   Not on file  Social History Narrative   Not on file   Social Determinants of Health   Financial Resource Strain: Low Risk  (07/16/2021)   Overall Financial Resource Strain (CARDIA)    Difficulty of Paying Living Expenses: Not hard at all  Food Insecurity: No Food Insecurity (07/16/2021)   Hunger Vital Sign    Worried About Running Out of Food in the Last Year: Never true    Ran Out of Food in the Last Year: Never true  Transportation Needs: No Transportation Needs (07/16/2021)   PRAPARE - Hydrologist (Medical): No    Lack of Transportation (Non-Medical): No  Physical Activity: Inactive (07/16/2021)   Exercise Vital Sign    Days of Exercise per Week: 0 days    Minutes of Exercise per Session: 0 min  Stress: No Stress Concern Present (07/16/2021)   [place]    Feeling of Stress : Only a little  Social Connections: Moderately Isolated (07/16/2021)   Social Connection and Isolation Panel [NHANES]    Frequency of Communication with Friends and Family: More than three times a week    Frequency of Social Gatherings with Friends and Family: Never    Attends Religious Services: More than 4 times per year    Active Member of [employer] or Organizations: No    Attends Archivist Meetings: Never     Marital Status: Never married  Intimate Partner Violence: Not At Risk (07/16/2021)   Humiliation, Afraid, Rape, and Kick questionnaire    Fear of Current or Ex-Partner: No    Emotionally Abused: No    Physically Abused: No    Sexually Abused: No    Outpatient Medications Prior to Visit  Medication Sig Dispense Refill   albuterol (PROVENTIL) (2.5 MG/3ML) 0.083% nebulizer solution Take 3 mLs (2.5 mg total) by nebulization every 6 (six) hours as needed for wheezing or shortness of breath. 75 mL 2   albuterol (VENTOLIN HFA) 108 (90 Base) MCG/ACT inhaler Inhale 2 puffs into the lungs every 6 (six) hours as needed for wheezing. 18 g 1   BD AUTOSHIELD DUO 30G X 5 MM MISC 1 KIT BY MISC ROUTE 3 TIMES DAILY BEFORE MEALS     benzonatate (TESSALON) 200 MG capsule Take 1 capsule (200 mg total) by mouth 2 (two) times daily as needed for cough. 20 capsule 0   Budeson-Glycopyrrol-Formoterol (BREZTRI AEROSPHERE) 160-9-4.8 MCG/ACT AERO Inhale two puffs twice daily to prevent cough or wheeze.  Rinse, gargle, and spit after use. 32.1 g 1   bumetanide (BUMEX) 1 MG tablet TAKE 1 TABLET(1 MG) BY MOUTH DAILY 30 tablet 0   cetirizine (ZYRTEC) 10 MG tablet Take 1 tablet by mouth daily.     Cholecalciferol 50 MCG (2000 UT) TABS Take 2,000 Units by mouth daily.     clopidogrel (PLAVIX) 75 MG tablet Take 1 tablet (75 mg total) by mouth daily. 90 tablet 3   Continuous Blood Gluc Sensor (DEXCOM G6 SENSOR) MISC USE 1 SENSOR AS NEEDED CHANGE EVERY 10 DAYS     Continuous Blood Gluc Transmit (DEXCOM G6 TRANSMITTER) MISC      cromolyn (OPTICROM) 4 % ophthalmic solution Place 1 drop into both eyes 4 (four) times daily. INSTILL 1 DROP INTO BOTH  EYES UP TO 4 TIMES DAILY AS NEEDED FOR  RED, ITCHY, OR WATERY EYES 60 mL 1   empagliflozin (JARDIANCE) 10 MG TABS tablet Take 1 tablet (10 mg total) by mouth daily before breakfast. 30 tablet 3   EPINEPHrine (EPIPEN 2-PAK) 0.3 mg/0.3 mL IJ SOAJ injection Use as directed for severe allergic  reactions 2 each 3   FASENRA PEN 30 MG/ML SOAJ Inject into the skin.     gabapentin (NEURONTIN) 300 MG capsule TAKE 1 CAPSULE BY MOUTH 3 TIMES  DAILY 270 capsule 1   glucagon 1 MG injection Inject 1 mg into the muscle once as needed (low blood sugar).     glucose 4 GM chewable tablet Chew 1 tablet by mouth once as needed for low blood sugar.     HUMALOG 100 UNIT/ML injection Inject into the skin.     HUMULIN N 100 UNIT/ML injection Inject 60 Units into the skin 3 (three) times daily before meals.     Insulin Disposable Pump (OMNIPOD 5 G6 INTRO, GEN 5,) KIT CHANGE POD EVERY 2 DAYS     isosorbide mononitrate (IMDUR) 60 MG 24 hr tablet Take 1 tablet (60 mg total) by mouth daily. 90 tablet 3   levocetirizine (XYZAL) 5 MG tablet TAKE 1 TABLET BY MOUTH EVERY  EVENING IF NEEDED 100 tablet 3   lisinopril (ZESTRIL) 20 MG tablet Take 1 tablet (20 mg total) by mouth daily. 90 tablet 3   methocarbamol (ROBAXIN) 500 MG tablet Take 1 tablet (500 mg total) by mouth every 8 (eight) hours as needed for muscle spasms. 20 tablet 0   metoprolol tartrate (LOPRESSOR) 50 MG tablet Take 1 tablet (50 mg total) by mouth every 12 (twelve) hours. 180 tablet 3   montelukast (SINGULAIR) 10 MG tablet TAKE 1 TABLET BY MOUTH AT  BEDTIME 100 tablet 1   nitroGLYCERIN (NITROSTAT) 0.4 MG SL tablet Place 1 tablet (0.4 mg total) under the tongue every 5 (five) minutes as needed for chest pain. 25 tablet 3   OZEMPIC, 2 MG/DOSE, 8 MG/3ML SOPN Inject 2 mg into the skin once a week.     pantoprazole (PROTONIX) 40 MG tablet TAKE 1 TABLET BY MOUTH IN THE  MORNING AND AT BEDTIME (Patient taking differently: Take 40 mg by mouth 2 (two) times daily.) 200 tablet 2   potassium chloride (KLOR-CON) 10 MEQ tablet Take 1 tablet (10 mEq total) by mouth daily. 90 tablet 3   Respiratory Therapy Supplies (CARETOUCH 2 CPAP HOSE HANGER) MISC by Does not apply route.     rosuvastatin (CRESTOR) 40 MG tablet Take 1 tablet (40 mg total) by mouth at bedtime. 90  tablet 3   silver sulfADIAZINE (SILVADENE) 1 % cream Apply 1 Application topically daily. 50 g 0   No facility-administered medications prior to visit.    Allergies  Allergen Reactions   Contrast Media [Iodinated Contrast Media] Shortness Of Breath and Other (See Comments)    sob, chest tight   Morphine Itching   Statins Diarrhea and Other (See Comments)    nausea, diarrhea, myalgias   Ioversol     Review of Systems  Constitutional:  Negative for fever and malaise/fatigue.  HENT:  Negative for congestion.   Eyes:  Negative for blurred vision.  Respiratory:  Positive for shortness of breath (with exertion).   Cardiovascular:  Negative for chest pain, palpitations and leg swelling.  Gastrointestinal:  Negative for abdominal pain, blood in stool and nausea.  Genitourinary:  Negative for dysuria and frequency.  Musculoskeletal:  Positive for back pain (lower). Negative for falls.       (+) Right groin pain   Skin:  Negative for rash.       (+) bilateral callus on feet  Neurological:  Negative for dizziness, loss of consciousness and headaches.  Endo/Heme/Allergies:  Negative for environmental allergies.  Psychiatric/Behavioral:  Negative for depression. The patient is not nervous/anxious.        Objective:    Physical Exam Vitals and nursing note reviewed.  Constitutional:      Appearance: She is well-developed. She is not diaphoretic.  HENT:     Head: Normocephalic and atraumatic.     Nose: No nasal deformity or rhinorrhea.     Left Sinus: No frontal sinus tenderness.     Mouth/Throat:     Pharynx: No oropharyngeal exudate.  Eyes:     Conjunctiva/sclera: Conjunctivae normal.  Neck:     Thyroid: No thyromegaly.     Vascular: No carotid bruit or JVD.  Cardiovascular:     Rate and Rhythm: Normal rate and regular rhythm.     Heart sounds: Normal heart sounds. No murmur heard. Pulmonary:     Effort: Pulmonary effort is normal. No respiratory distress.     Breath  sounds: Normal breath sounds. No wheezing or rales.  Chest:     Chest wall: No tenderness.  Musculoskeletal:     Cervical back: Normal range of motion and neck supple.     Right foot: Bony tenderness present.     Left foot: Bony tenderness present.     Comments: Callous on ball of both feet  + tender spot bottom both heels   Feet:     Right foot:     Skin integrity: Callus (ball of foot) present.     Left foot:     Skin integrity: Callus (ball of foot) present.  Lymphadenopathy:     Cervical: No cervical adenopathy.  Skin:    General: Skin is warm.  Neurological:     General: No focal deficit present.     Mental Status: She is alert and oriented to person, place, and time.  Psychiatric:        Mood and Affect: Mood normal.        Behavior: Behavior normal.        Thought Content: Thought content normal.        Judgment: Judgment normal.     BP 110/80 (BP Location: Right Arm, Patient Position: Sitting, Cuff Size: Large)   Pulse 81   Temp 98.3 F (36.8 C) (Oral)   Resp 20   Ht 5\' 5"  (1.651 m)   Wt 242 lb 12.8 oz (110.1 kg)   SpO2 97%   BMI 40.40 kg/m  Wt Readings from Last 3 Encounters:  05/09/22 242 lb 12.8 oz (110.1 kg)  05/08/22 242 lb 9.6 oz (110 kg)  05/05/22 243 lb 6.4 oz (110.4 kg)       Assessment & Plan:  Chronic hip pain, left Assessment & Plan: Check xray  Consider ortho/ sport med  Orders: -     DG HIP UNILAT W OR W/O PELVIS 2-3 VIEWS LEFT; Future  Acute on chronic combined systolic and diastolic CHF (congestive heart failure) (HCC)  Chronic bronchitis, unspecified chronic bronchitis type (HCC)  Blood clotting disorder ([place])  Morbid obesity ([place])  Secondary hyperparathyroidism of renal origin ([place])  Coronary artery disease of native artery of native heart with stable angina pectoris (HCC)  Type 2 diabetes  mellitus with stage 3b chronic kidney disease, with long-term current use of insulin ([place]) Assessment & Plan: Rx for diabetic shoes  filled out and to be faxed to the podiatry      I,Rachel Rivera,acting as a scribe for Ann Held, DO.,have documented all relevant documentation on the behalf of Ann Held, DO,as directed by  Ann Held, DO while in the presence of Ann Held, DO.   I, Ann Held, DO, personally preformed the services described in this documentation.  All medical record entries made by the scribe were at my direction and in my presence.  I have reviewed the chart and discharge instructions (if applicable) and agree that the record reflects my personal performance and is accurate and complete. 05/11/22   Ann Held, DO

## 2022-05-11 ENCOUNTER — Encounter: Payer: Self-pay | Admitting: Family Medicine

## 2022-05-11 DIAGNOSIS — J42 Unspecified chronic bronchitis: Secondary | ICD-10-CM | POA: Insufficient documentation

## 2022-05-11 DIAGNOSIS — D689 Coagulation defect, unspecified: Secondary | ICD-10-CM | POA: Insufficient documentation

## 2022-05-11 DIAGNOSIS — G8929 Other chronic pain: Secondary | ICD-10-CM | POA: Insufficient documentation

## 2022-05-11 DIAGNOSIS — I5043 Acute on chronic combined systolic (congestive) and diastolic (congestive) heart failure: Secondary | ICD-10-CM | POA: Insufficient documentation

## 2022-05-11 NOTE — Assessment & Plan Note (Signed)
Check xray  Consider ortho/ sport med

## 2022-05-11 NOTE — Assessment & Plan Note (Signed)
Rx for diabetic shoes filled out and to be faxed to the podiatry

## 2022-05-11 NOTE — Patient Instructions (Signed)

## 2022-05-14 ENCOUNTER — Ambulatory Visit: Payer: Self-pay | Admitting: Licensed Clinical Social Worker

## 2022-05-14 NOTE — Patient Outreach (Signed)
  Care Coordination  Initial Visit Note   [id number] Name: Katrina Vega MRN: [id number] DOB: [id number]  Katrina Vega is a 67 y.o. year old female who sees Carollee Herter, Alferd Apa, DO for primary care. I spoke with  Katrina Vega by phone today.  What matters to the patients health and wellness today?  Patient scheduled phone appointment to obtain additional information about the Care Coordination Program..   SDOH assessments and interventions completed:  No  Care Coordination Interventions:  No, not indicated   Follow up plan: Follow up call scheduled for 05/15/22    Encounter Outcome:  Pt. Visit Completed   Casimer Lanius, [place] [phone]

## 2022-05-14 NOTE — Patient Instructions (Signed)
  It was a pleasure speaking with you today. Per your request a Care Coordination phone appointment is scheduled 04/16/22  Casimer Lanius, [place] Work Care Coordination  [phone]

## 2022-05-14 NOTE — Progress Notes (Signed)
Please let Katrina Vega know that we received her lab work. 1. chicken: This is negative. 2. onion: This is negative. 3.  Cranberry: This is negative. 4.  Peanuts and tree nuts: These were all negative.  If interested she could schedule an in office oral food challenge to the chicken salad that she had from [facility] with the cranberries, slice almonds, and the added red onion. She would need to bring this with her the day of the challenge. We would need to do skin testing to these foods prior to starting the challenge. We do not have cranberry available to skin test. She could bring some fresh, not canned, cranberries with her the day of the challenge. This appointment will last 2-3 hours and she will need to be in good health. (Not on any antibiotics or recent vaccinations.) She would also need to be off all antihistamines 3 days prior to this appointment. If she is not interested in doing this challenge she can continue to avoid.

## 2022-05-15 ENCOUNTER — Ambulatory Visit: Payer: Self-pay | Admitting: Licensed Clinical Social Worker

## 2022-05-15 ENCOUNTER — Ambulatory Visit (INDEPENDENT_AMBULATORY_CARE_PROVIDER_SITE_OTHER): Payer: 59

## 2022-05-15 DIAGNOSIS — J309 Allergic rhinitis, unspecified: Secondary | ICD-10-CM | POA: Diagnosis not present

## 2022-05-15 NOTE — Patient Instructions (Signed)
Visit Information  Thank you for taking time to visit with me today. Please don't hesitate to contact me if I can be of assistance to you.   Following are the goals we discussed today:   Goals Addressed             This Visit's Progress    Manage mental and physical health       Activities and task to complete in order to accomplish goals.   You have decided to focus on your mental health needs during our next appointment Continue with compliance of taking medication prescribed by Doctor I have scheduled a phone appointment with the [place] she will provide educational informational and assist you with managing your health needs you have indicated you would like to start with diabetes.       Our next appointment is by telephone on 05/27/22 at 1:15  Please call the care guide team at [phone] if you need to cancel or reschedule your appointment.   If you are experiencing a Mental Health or [place] or need someone to talk to, please call [phone]-TALK (toll free, 24 hour hotline)   Patient verbalizes understanding of instructions and care plan provided today and agrees to view in [place]. Active MyChart status and patient understanding of how to access instructions and care plan via MyChart confirmed with patient.     Casimer Lanius, [place] [phone]

## 2022-05-15 NOTE — Patient Outreach (Signed)
  Care Coordination  Initial Visit Note   [id number] Name: Katrina Vega MRN: [id number] DOB: [id number]  Katrina Vega is a 67 y.o. year old female who sees Carollee Herter, Alferd Apa, DO for primary care. I spoke with  Katrina Vega by phone today.  What matters to the patients health and wellness today?  Managing her mental and physical health  Patient is looking forward to work with the Care Coordination team.  She reports several health concerns that she would benefit from receiving additional education and support.     Goals Addressed             This Visit's Progress    Manage mental and physical health       Activities and task to complete in order to accomplish goals.   You have decided to focus on your mental health needs during our next appointment Continue with compliance of taking medication prescribed by Doctor I have scheduled a phone appointment with the [place] she will provide educational informational and assist you with managing your health needs you have indicated you would like to start with diabetes.       SDOH assessments and interventions completed:  Yes  SDOH Interventions Today    Flowsheet Row Most Recent Value  SDOH Interventions   Food Insecurity Interventions Intervention Not Indicated  Housing Interventions Intervention Not Indicated  Transportation Interventions Intervention Not Indicated  Stress Interventions Provide Counseling      Care Coordination Interventions:  Yes, provided  Interventions Today    Flowsheet Row Most Recent Value  Chronic Disease   Chronic disease during today's visit Congestive Heart Failure (CHF), Chronic Kidney Disease/End Stage Renal Disease (ESRD), Diabetes, Chronic Obstructive Pulmonary Disease (COPD)  General Interventions   General Interventions Discussed/Reviewed General Interventions Discussed, Referral to Nurse  [facility] Coordination program and assessed needs]  Mental Health Interventions    Mental Health Discussed/Reviewed Mental Health Reviewed, Anxiety, Other  [stress]  Nutrition Interventions   Nutrition Discussed/Reviewed Nutrition Discussed  [would like to discuss healthy eating with RN]       Follow up plan:  Referral made to RN Care Manager 05/30/22 Follow up call scheduled for LCSW 05/27/22    Encounter Outcome:  Pt. Visit Completed   Casimer Lanius, [place] [phone]

## 2022-05-19 ENCOUNTER — Ambulatory Visit (INDEPENDENT_AMBULATORY_CARE_PROVIDER_SITE_OTHER): Payer: 59 | Admitting: *Deleted

## 2022-05-19 DIAGNOSIS — J4550 Severe persistent asthma, uncomplicated: Secondary | ICD-10-CM

## 2022-05-19 MED ORDER — BENRALIZUMAB 30 MG/ML ~~LOC~~ SOSY
30.0000 mg | PREFILLED_SYRINGE | SUBCUTANEOUS | Status: AC
  Administered 2022-05-19 – 2024-03-08 (×11): 30 mg via SUBCUTANEOUS

## 2022-05-22 ENCOUNTER — Ambulatory Visit (INDEPENDENT_AMBULATORY_CARE_PROVIDER_SITE_OTHER): Payer: 59

## 2022-05-22 DIAGNOSIS — J309 Allergic rhinitis, unspecified: Secondary | ICD-10-CM | POA: Diagnosis not present

## 2022-05-27 ENCOUNTER — Encounter: Payer: Self-pay | Admitting: Licensed Clinical Social Worker

## 2022-05-29 ENCOUNTER — Ambulatory Visit (INDEPENDENT_AMBULATORY_CARE_PROVIDER_SITE_OTHER): Payer: 59

## 2022-05-29 DIAGNOSIS — J309 Allergic rhinitis, unspecified: Secondary | ICD-10-CM

## 2022-05-30 ENCOUNTER — Ambulatory Visit: Payer: Self-pay

## 2022-05-30 NOTE — Patient Outreach (Signed)
  Care Coordination   Initial Visit Note   05/30/2022 Name: CLESTER CISEK MRN: [id number] DOB: 1955/04/18  [name] is a 67 y.o. year old female who sees Zola Button, [place], DO for primary care. I spoke with  [name] by phone today.  What matters to the patients health and wellness today?  Ms. Talwar reports she would like education regarding diabetes and nutrition. She reports she has an Omnipod and Dexcom 6. She is being followed by the Diabetic educator and reports her blood sugars have improved since switching to the Omnipod.   Goals Addressed             This Visit's Progress    Health Management education       Interventions Today    Flowsheet Row Most Recent Value  Chronic Disease   Chronic disease during today's visit Diabetes, Chronic Obstructive Pulmonary Disease (COPD), Congestive Heart Failure (CHF), Hypertension (HTN)  [patient reports low back pain and states arthritis]  General Interventions   General Interventions Discussed/Reviewed General Interventions Discussed, Labs, Doctor Visits  Labs --  [latest A1C 7.5]  Doctor Visits Discussed/Reviewed Doctor Visits Discussed, Doctor Visits Reviewed  Education Interventions   Education Provided Provided Education, Provided Web-based Education  [emmi diabetes type 2, Diabetes: nutrition and healthy eating, diabetes: carbohydrat counting: making everyday task easier with arthritis]  Provided Verbal Education On Blood Sugar Monitoring, Exercise, Medication, When to see the doctor, Insurance Plans  [encouraged to contact insurance company to discuss holistic care coverage and to see inquire about a CHF monitoring program and elibility if they have one. encouraged to contact provider with health questions/concerns.]  Nutrition Interventions   Nutrition Discussed/Reviewed Nutrition Discussed  Pharmacy Interventions   Pharmacy Dicussed/Reviewed Pharmacy Topics Discussed            SDOH assessments and interventions  completed:  Yes  SDOH Interventions Today    Flowsheet Row Most Recent Value  SDOH Interventions   Utilities Interventions Intervention Not Indicated     Care Coordination Interventions:  Yes, provided   Follow up plan: Follow up call scheduled for 06/25/22    Encounter Outcome:  Pt. Visit Completed   Kathyrn Sheriff, RN, MSN, BSN, CCM [facility] Care Coordinator [phone]

## 2022-05-30 NOTE — Patient Instructions (Addendum)
Visit Information  Thank you for taking time to visit with me today. Please don't hesitate to contact me if I can be of assistance to you.   Following are the goals we discussed today:   Goals Addressed             This Visit's Progress    Health Management education       Interventions Today    Flowsheet Row Most Recent Value  Chronic Disease   Chronic disease during today's visit Diabetes, Chronic Obstructive Pulmonary Disease (COPD), Congestive Heart Failure (CHF), Hypertension (HTN)  [patient reports low back pain and states arthritis]  General Interventions   General Interventions Discussed/Reviewed General Interventions Discussed, Labs, Doctor Visits  Labs --  [latest A1C 7.5]  Doctor Visits Discussed/Reviewed Doctor Visits Discussed, Doctor Visits Reviewed  Education Interventions   Education Provided Provided Education, Provided Web-based Education  [emmi diabetes type 2, Diabetes: nutrition and healthy eating, diabetes: carbohydrat counting: making everyday task easier with arthritis]  Provided Verbal Education On Blood Sugar Monitoring, Exercise, Medication, When to see the doctor, Insurance Plans  [encouraged to contact insurance company to discuss holistic care coverage and to see inquire about a CHF monitoring program and elibility if they have one. encouraged to contact provider with health questions/concerns.]  Nutrition Interventions   Nutrition Discussed/Reviewed Nutrition Discussed  Pharmacy Interventions   Pharmacy Dicussed/Reviewed Pharmacy Topics Discussed            Our next appointment is by telephone on 06/25/22 at 2:00 pm  Please call the care guide team at [phone] if you need to cancel or reschedule your appointment.   If you are experiencing a Mental Health or Behavioral Health Crisis or need someone to talk to, please call the Suicide and Crisis Lifeline: 13  Kathyrn Sheriff, RN, MSN, BSN, CCM [facility] Care Coordinator [phone]

## 2022-06-02 ENCOUNTER — Inpatient Hospital Stay: Payer: 59

## 2022-06-02 ENCOUNTER — Inpatient Hospital Stay: Payer: 59 | Admitting: Family

## 2022-06-03 ENCOUNTER — Ambulatory Visit: Payer: Self-pay | Admitting: Licensed Clinical Social Worker

## 2022-06-03 NOTE — Patient Outreach (Signed)
  Care Coordination  Follow Up Visit Note   06/03/2022 Name: MANASVI BERNDT MRN: [id number] DOB: 06/21/1955  [name] is a 67 y.o. year old female who sees Zola Button, Grayling Congress, DO for primary care. I spoke with  [name] by phone today.  What matters to the patients health and wellness today?  Managing stress and improving sleep    Goals Addressed             This Visit's Progress    Manage mental and physical health       Activities and task to complete in order to accomplish goals.   Keep all upcoming appointment discussed today Continue with compliance of taking medication prescribed by Doctor Self Support options  (working of life balance, and pouring back into yourself) Review your EMMI educational information (Insomnia and getting a good night's sleep) Look for an e-mail from [facility]  Review check list for better sleep and sleep hygiene work sheet that was e-mailed to you       SDOH assessments and interventions completed:  Yes      06/03/2022    1:21 PM 03/24/2022    1:12 PM 12/17/2021   10:32 AM  Depression screen PHQ 2/9  Decreased Interest 1 0 0  Down, Depressed, Hopeless 0 0 0  PHQ - 2 Score 1 0 0     Care Coordination Interventions:  Yes, provided  Interventions Today    Flowsheet Row Most Recent Value  Chronic Disease   Chronic disease during today's visit Congestive Heart Failure (CHF), Hypertension (HTN), Chronic Obstructive Pulmonary Disease (COPD)  Education Interventions   Education Provided Provided Education, Provided Web-based Education  [sleep hygiene from [facility] education]  Provided Verbal Education On Mental Health/Coping with Illness  Mental Health Interventions   Mental Health Discussed/Reviewed Mental Health Discussed, Anxiety, Coping Strategies  [solution focused,  task centered, emotional support, active listening, ]       Follow up plan: Follow up call scheduled for 2 weeks    Encounter Outcome:  Pt. Visit  Completed    Sammuel Hines, LCSW Social Work Care Coordination  [facility] [employer] [employer] [phone]

## 2022-06-03 NOTE — Patient Instructions (Addendum)
Visit Information  Thank you for taking time to visit with me today. Please don't hesitate to contact me if I can be of assistance to you.   Following are the goals we discussed today:   Goals Addressed             This Visit's Progress    Manage mental and physical health       Activities and task to complete in order to accomplish goals.   Keep all upcoming appointment discussed today Continue with compliance of taking medication prescribed by Doctor Self Support options  (working of life balance, and pouring back into yourself) Review your EMMI educational information (Insomnia and getting a good night's sleep) Look for an e-mail from [facility].  Review check list for better sleep and sleep hygiene work sheet that was e-mailed to you        Our next appointment is by telephone on 06/17/22 at 1:15  Please call the care guide team at [phone] if you need to cancel or reschedule your appointment.   Patient verbalizes understanding of instructions and care plan provided today and agrees to view in MyChart. Active MyChart status and patient understanding of how to access instructions and care plan via MyChart confirmed with patient.     Sammuel Hines, LCSW Social Work Care Coordination  [facility] [employer] [employer] [phone]

## 2022-06-05 ENCOUNTER — Ambulatory Visit (INDEPENDENT_AMBULATORY_CARE_PROVIDER_SITE_OTHER): Payer: 59

## 2022-06-05 DIAGNOSIS — J309 Allergic rhinitis, unspecified: Secondary | ICD-10-CM | POA: Diagnosis not present

## 2022-06-06 ENCOUNTER — Ambulatory Visit
Admission: RE | Admit: 2022-06-06 | Discharge: 2022-06-06 | Disposition: A | Discharge status: Home / Self Care | Payer: 59 | Source: Ambulatory Visit | Attending: Pulmonary Disease | Admitting: Pulmonary Disease

## 2022-06-06 DIAGNOSIS — I700 Atherosclerosis of aorta: Secondary | ICD-10-CM | POA: Diagnosis not present

## 2022-06-06 DIAGNOSIS — R918 Other nonspecific abnormal finding of lung field: Secondary | ICD-10-CM | POA: Diagnosis not present

## 2022-06-06 DIAGNOSIS — R590 Localized enlarged lymph nodes: Secondary | ICD-10-CM | POA: Diagnosis not present

## 2022-06-06 DIAGNOSIS — R0602 Shortness of breath: Secondary | ICD-10-CM

## 2022-06-06 DIAGNOSIS — R0609 Other forms of dyspnea: Secondary | ICD-10-CM

## 2022-06-06 DIAGNOSIS — J449 Chronic obstructive pulmonary disease, unspecified: Secondary | ICD-10-CM | POA: Diagnosis not present

## 2022-06-10 DIAGNOSIS — E1165 Type 2 diabetes mellitus with hyperglycemia: Secondary | ICD-10-CM | POA: Diagnosis not present

## 2022-06-10 DIAGNOSIS — M2042 Other hammer toe(s) (acquired), left foot: Secondary | ICD-10-CM | POA: Diagnosis not present

## 2022-06-16 ENCOUNTER — Ambulatory Visit: Payer: 59

## 2022-06-16 ENCOUNTER — Other Ambulatory Visit: Payer: Self-pay | Admitting: Family Medicine

## 2022-06-17 ENCOUNTER — Ambulatory Visit: Payer: Self-pay | Admitting: Licensed Clinical Social Worker

## 2022-06-17 NOTE — Patient Instructions (Signed)
Visit Information  Thank you for taking time to visit with me today. Please don't hesitate to contact me if I can be of assistance to you.   Following are the goals we discussed today:   Goals Addressed             This Visit's Progress    Manage mental health       Activities and task to complete in order to accomplish goals.   Keep all upcoming appointment discussed today Continue with compliance of taking medication prescribed by Doctor Self Support options  (continue working on life balance, pouring back into yourself and continue with sleep hygiene)         Our next appointment is by telephone on 07/08/22 at 1:15  Please call the care guide team at [phone] if you need to cancel or reschedule your appointment.   If you are experiencing a Mental Health or Behavioral Health Crisis or need someone to talk to, please call the Suicide and Crisis Lifeline: 988 call the [place] National Suicide Prevention Lifeline: (702)367-7946 or TTY: 954-547-1099 TTY (916)288-4504) to talk to a trained counselor call 1-800-273-TALK (toll free, 24 hour hotline) go to [facility] [address], [place] [phone])   Patient verbalizes understanding of instructions and care plan provided today and agrees to view in MyChart. Active MyChart status and patient understanding of how to access instructions and care plan via MyChart confirmed with patient.     Sammuel Hines, LCSW Social Work Care Coordination  [facility] [employer] [phone]

## 2022-06-17 NOTE — Patient Outreach (Signed)
  Care Coordination  Follow Up Visit Note   06/17/2022 Name: Katrina Vega MRN: [id number] DOB: 04-Mar-1955  Katrina Vega is a 67 y.o. year old female who sees Zola Button, Grayling Congress, DO for primary care. I spoke with  Katrina Vega by phone today.  What matters to the patients health and wellness today?  Reducing stress    Goals Addressed             This Visit's Progress    Manage mental health       Activities and task to complete in order to accomplish goals.   Keep all upcoming appointment discussed today Continue with compliance of taking medication prescribed by Doctor Self Support options  (continue working on life balance, pouring back into yourself and continue with sleep hygiene)        SDOH assessments and interventions completed:  No   Care Coordination Interventions:  Yes, provided  Interventions Today    Flowsheet Row Most Recent Value  Chronic Disease   Chronic disease during today's visit Congestive Heart Failure (CHF), Chronic Kidney Disease/End Stage Renal Disease (ESRD), Diabetes  General Interventions   Doctor Visits Discussed/Reviewed Doctor Visits Reviewed  [upcoming appointments]  Education Interventions   Education Provided Provided Education  Provided Verbal Education On Mental Health/Coping with Illness  Mental Health Interventions   Mental Health Discussed/Reviewed Mental Health Reviewed, Anxiety  [solution focused,  emotional support ,  active listening,  also reviewed sleep hygiene, ]       Follow up plan: Follow up call scheduled for 07/08/22    Encounter Outcome:  Pt. Visit Completed   Sammuel Hines, LCSW Social Work Care Coordination  [facility] [employer] [employer] [employer] [phone]

## 2022-06-19 ENCOUNTER — Ambulatory Visit (INDEPENDENT_AMBULATORY_CARE_PROVIDER_SITE_OTHER): Payer: 59

## 2022-06-19 ENCOUNTER — Encounter: Payer: Self-pay | Admitting: Pulmonary Disease

## 2022-06-19 ENCOUNTER — Ambulatory Visit (INDEPENDENT_AMBULATORY_CARE_PROVIDER_SITE_OTHER): Payer: 59 | Admitting: Pulmonary Disease

## 2022-06-19 VITALS — BP 130/80 | HR 80 | Ht 65.0 in | Wt 249.2 lb

## 2022-06-19 DIAGNOSIS — H1010 Acute atopic conjunctivitis, unspecified eye: Secondary | ICD-10-CM

## 2022-06-19 DIAGNOSIS — R0602 Shortness of breath: Secondary | ICD-10-CM

## 2022-06-19 DIAGNOSIS — J3089 Other allergic rhinitis: Secondary | ICD-10-CM

## 2022-06-19 DIAGNOSIS — J4489 Other specified chronic obstructive pulmonary disease: Secondary | ICD-10-CM

## 2022-06-19 DIAGNOSIS — J449 Chronic obstructive pulmonary disease, unspecified: Secondary | ICD-10-CM

## 2022-06-19 DIAGNOSIS — J302 Other seasonal allergic rhinitis: Secondary | ICD-10-CM

## 2022-06-19 DIAGNOSIS — J309 Allergic rhinitis, unspecified: Secondary | ICD-10-CM

## 2022-06-19 DIAGNOSIS — R0609 Other forms of dyspnea: Secondary | ICD-10-CM

## 2022-06-19 LAB — PULMONARY FUNCTION TEST
DL/VA % pred: 118 %
DL/VA: 4.89 ml/min/mmHg/L
DLCO cor % pred: 60 %
DLCO cor: 12.35 ml/min/mmHg
DLCO unc % pred: 60 %
DLCO unc: 12.35 ml/min/mmHg
FEF 25-75 Post: 1.48 L/sec
FEF 25-75 Pre: 2.53 L/sec
FEF2575-%Change-Post: -41 %
FEF2575-%Pred-Post: 72 %
FEF2575-%Pred-Pre: 123 %
FEV1-%Change-Post: -12 %
FEV1-%Pred-Post: 64 %
FEV1-%Pred-Pre: 73 %
FEV1-Post: 1.57 L
FEV1-Pre: 1.78 L
FEV1FVC-%Change-Post: -7 %
FEV1FVC-%Pred-Pre: 117 %
FEV6-%Change-Post: -8 %
FEV6-%Pred-Post: 59 %
FEV6-%Pred-Pre: 65 %
FEV6-Post: 1.83 L
FEV6-Pre: 2 L
FEV6FVC-%Pred-Post: 104 %
FEV6FVC-%Pred-Pre: 104 %
FVC-%Change-Post: -4 %
FVC-%Pred-Post: 59 %
FVC-%Pred-Pre: 62 %
FVC-Post: 1.9 L
FVC-Pre: 2 L
Post FEV1/FVC ratio: 82 %
Post FEV6/FVC ratio: 100 %
Pre FEV1/FVC ratio: 89 %
Pre FEV6/FVC Ratio: 100 %
RV % pred: 136 %
RV: 2.99 L
TLC % pred: 92 %
TLC: 4.81 L

## 2022-06-19 NOTE — Progress Notes (Signed)
Full PFT performed today. °

## 2022-06-19 NOTE — Patient Instructions (Signed)
Full PFT performed today. °

## 2022-06-19 NOTE — Patient Instructions (Addendum)
Thank you for visiting Dr. Tonia Brooms at [facility]. Today we recommend the following:  Continue fasenra Continue breztri  Continue albuterol as needed   Return in about 1 year (around 06/19/2023) for with APP. Or as needed     Please do your part to reduce the spread of COVID-19.

## 2022-06-19 NOTE — Progress Notes (Signed)
Synopsis: Referred in March 2024 for asthma on fasenra by Zola Button, Grayling Congress, *  Subjective:   PATIENT ID: Katrina Vega GENDER: female DOB: 05/29/1955, MRN: [id number]  Chief Complaint  Patient presents with   Follow-up    F/up PFT    This is a 67 year old female, coronary disease, history of heart failure diastolic heart failure, CKD 3, COPD/asthma.  History of hyperlipidemia patient was seen today for evaluation of shortness of breath.  She has a BMI of 40.  She is currently managed with her asthma from the allergy center.  On inhalers as well as Harrington Challenger.  Her PFTs completed there showed a reduction in her FEV1 and FVC but a normal ratio.  Felt to have possible underlying restriction.  She does have a history of COVID.  No occupational exposures.  No history of rheumatologic diseases  OV 06/19/2022:Patient here today for follow-up for evaluation after HRCT imaging and PFTs.  HRCT imaging complete on 06/06/2022.  This revealed patchy areas of air trapping within both lungs consistent with small airway disease.  No compelling findings consistent with ILD.  Also has a 2.1 cm adrenal nodule consistent with benign lesion.  From respiratory complaint has no issues.  Still feels some shortness of breath when she exerts herself.  She has been doing well on Fasenra injections started by her allergist.  Also using her triple therapy inhaler regimen.  No recent exacerbations, no steroids or antibiotics.    Past Medical History:  Diagnosis Date   Asthma    CAD S/P two-vessel DES PCI 2008   LAD and RI; 2013 PTCA of small RCA.   CHF (congestive heart failure), NYHA class I, chronic, diastolic 2019   Recent Echo 12/16/2021: Mild concentric LVH.  EF 59%.  GI 1 DD.  Normal LAP-(Mildly dilated LA;; has converted from to torsemide and now on bumetanide   CKD stage 3 due to type 2 diabetes mellitus    COPD (chronic obstructive pulmonary disease) 2021   Complicated by asthma and seasonal allergies.    Diabetes mellitus, type II, insulin dependent 2010   On insulin 60 units TID Premeal.  Also on Jardiance and Ozempic.   Eczema    Essential hypertension    Heart attack 2008   2008-two-vessel PCI; 2013 PTCA only small RCA   History of left hip replacement 04/2018   Hyperlipidemia associated with type 2 diabetes mellitus    140 mg rosuvastatin   Insulin pump in place    PAD (peripheral artery disease)    Status post bilateral SFA stents and right posterior tibial DES stent   Primary hypertension 01/20/2011      Patient's blood pressure is well controlled.  Continue current medications.     Family History  Problem Relation Age of Onset   Heart disease Mother    Breast cancer Mother    Heart attack Father    Lung cancer Father    Eczema Grandson    Allergic rhinitis Neg Hx    Angioedema Neg Hx    Asthma Neg Hx    Atopy Neg Hx    Immunodeficiency Neg Hx    Urticaria Neg Hx      Past Surgical History:  Procedure Laterality Date   AAA DUPLEX  10/09/2020   Max Aorta (sac) diameter 2.51 cm prox with mild ectasia in Prox Aorta. Diffuse plaque in mid-distal Aorta. Bilateral ICA poorly visulailzed - appear to be Severely stenosed with difuse plaque. - Consider CTA of cath  directed Angio.   APPENDECTOMY  1974   BIOPSY  02/22/2021   Procedure: BIOPSY;  Surgeon: Jeani Hawking, MD;  Location: [facility];  Service: Endoscopy;;   CARPAL TUNNEL RELEASE  2017   COLONOSCOPY WITH PROPOFOL N/A 02/22/2021   Procedure: COLONOSCOPY WITH PROPOFOL;  Surgeon: Jeani Hawking, MD;  Location: [facility];  Service: Endoscopy;  Laterality: N/A;   CORONARY BALLOON ANGIOPLASTY  2013   PTCA of RCA followed by PCI   CORONARY STENT INTERVENTION  2008   Text DES PCI to LAD and RI/OM1; also prox RCA   ESOPHAGOGASTRODUODENOSCOPY (EGD) WITH PROPOFOL N/A 02/22/2021   Procedure: ESOPHAGOGASTRODUODENOSCOPY (EGD) WITH PROPOFOL;  Surgeon: Jeani Hawking, MD;  Location: [facility];  Service: Endoscopy;   Laterality: N/A;   LEA Dopplers  10/09/2020   R mid & Distal SFA -CTO - dampend flow in R Pop via collaterals - Moderate velocity increase in R PFA. No signficant stenosis in LLE.   R ABI 0.97) - normal. L ABI - 0.91 w/ monophasic flow in L AT -> c/w 11/2019- R SFA new.   LEFT HEART CATH AND CORONARY ANGIOGRAPHY  12/22/2017   Mild LM plaque.  Mild proximal LAD plaque.  High D1-40% ostial.  Patent mid LAD DES (Taxus from 2008), large distal LAD free disease; Prox LCx-OM1 stents patent (Taxus 2008). Small RCA - patent prox stent(~50-60% ISR), PTCA site from 2013 - patent   LOWER EXTREMITY ANGIOGRAM Bilateral 12/22/2017   Bilateral SFA stents with evidence of severe right and mid left ISR bilateral popliteal arteries proximal trifurcation vessels are patent.  Moderate to severe lesion involving mid R AntTib, poor distal flow in L Ant Tib - 2 V runoff Bilat. --> referred for R SFA Laser Atherectomy & DCB 5 x 120 mm.   LOWER EXTREMITY INTERVENTION Right 12/22/2017   Laser atherectomy of right SFA followed by [facility] with 5.0 x 120 mm impact.-For severe right SFA ISR   LUMBAR SPINE SURGERY  2010   NM MYOVIEW LTD  07/07/2019   [facility]): Lexiscan.  Nondiagnostic EKG.  Dyspnea with effusion.  No ischemia or infarction.  Soft tissue attenuation noted.Marland Kitchen  LVEF 72%.  No RWMA.  LOW RISK.--No change from 11/2017   POLYPECTOMY  02/22/2021   Procedure: POLYPECTOMY;  Surgeon: Jeani Hawking, MD;  Location: [facility];  Service: Endoscopy;;   REPLACEMENT TOTAL KNEE  2017 and 2018   TONSILLECTOMY     TOTAL ABDOMINAL HYSTERECTOMY  1989   TOTAL HIP ARTHROPLASTY  04/2018   TOTAL SHOULDER ARTHROPLASTY  11/2019   TRANSTHORACIC ECHOCARDIOGRAM  07/04/2019   Normal LV size, mild LVH, hyperdynamic LVEF at >65% with grade 1 diastolic dysfunction.  Otherwise no other significant abnormality.  Poor quality due to patient body habitus.   TRANSTHORACIC ECHOCARDIOGRAM  12/16/2021   [facility]) normal LV size and function.  Moderate concentric LVH.  Normal WM.  EF estimated 59%.  GR 1 DD.  Mild LA dilation.  No valvular lesions.    Social History   Socioeconomic History   Marital status: Single    Spouse name: Not on file   Number of children: 5   Years of education: Not on file   Highest education level: Not on file  Occupational History   Not on file  Tobacco Use   Smoking status: Former    Packs/day: 0.50    Years: 15.00    Additional pack years: 0.00    Total pack years: 7.50    Types:  Cigarettes    Quit date: 10/14/1995    Years since quitting: 26.6   Smokeless tobacco: Never  Vaping Use   Vaping Use: Never used  Substance and Sexual Activity   Alcohol use: Not Currently   Drug use: Not Currently   Sexual activity: Yes  Other Topics Concern   Not on file  Social History Narrative   Not on file   Social Determinants of Health   Financial Resource Strain: Low Risk  (07/16/2021)   Overall Financial Resource Strain (CARDIA)    Difficulty of Paying Living Expenses: Not hard at all  Food Insecurity: No Food Insecurity (05/15/2022)   Hunger Vital Sign    Worried About Running Out of Food in the Last Year: Never true    Ran Out of Food in the Last Year: Never true  Transportation Needs: No Transportation Needs (05/15/2022)   PRAPARE - Administrator, Civil Service (Medical): No    Lack of Transportation (Non-Medical): No  Physical Activity: Inactive (07/16/2021)   Exercise Vital Sign    Days of Exercise per Week: 0 days    Minutes of Exercise per Session: 0 min  Stress: Stress Concern Present (05/15/2022)   [employer] of Occupational Health - Occupational Stress Questionnaire    Feeling of Stress : Rather much  Social Connections: Moderately Isolated (07/16/2021)   Social Connection and Isolation Panel [NHANES]    Frequency of Communication with Friends and Family: More than three times a week    Frequency of Social  Gatherings with Friends and Family: Never    Attends Religious Services: More than 4 times per year    Active Member of [employer] or Organizations: No    Attends Banker Meetings: Never    Marital Status: Never married  Intimate Partner Violence: Not At Risk (07/16/2021)   Humiliation, Afraid, Rape, and Kick questionnaire    Fear of Current or Ex-Partner: No    Emotionally Abused: No    Physically Abused: No    Sexually Abused: No     Allergies  Allergen Reactions   Contrast Media [Iodinated Contrast Media] Shortness Of Breath and Other (See Comments)    sob, chest tight   Morphine Itching   Statins Diarrhea and Other (See Comments)    nausea, diarrhea, myalgias   Ioversol      Outpatient Medications Prior to Visit  Medication Sig Dispense Refill   albuterol (PROVENTIL) (2.5 MG/3ML) 0.083% nebulizer solution Take 3 mLs (2.5 mg total) by nebulization every 6 (six) hours as needed for wheezing or shortness of breath. 75 mL 2   albuterol (VENTOLIN HFA) 108 (90 Base) MCG/ACT inhaler Inhale 2 puffs into the lungs every 6 (six) hours as needed for wheezing. 18 g 1   BD AUTOSHIELD DUO 30G X 5 MM MISC 1 KIT BY MISC ROUTE 3 TIMES DAILY BEFORE MEALS     benzonatate (TESSALON) 200 MG capsule Take 1 capsule (200 mg total) by mouth 2 (two) times daily as needed for cough. 20 capsule 0   Budeson-Glycopyrrol-Formoterol (BREZTRI AEROSPHERE) 160-9-4.8 MCG/ACT AERO INHALE 2 INHALATIONS BY MOUTH  TWICE DAILY TO PREVENT COUGH OR  WHEEZE. RINSE, GARGLE, AND SPIT  AFTER USE 32.1 g 1   bumetanide (BUMEX) 1 MG tablet TAKE 1 TABLET(1 MG) BY MOUTH DAILY 30 tablet 0   cetirizine (ZYRTEC) 10 MG tablet Take 1 tablet by mouth daily.     Cholecalciferol 50 MCG (2000 UT) TABS Take 2,000 Units by mouth daily.  clopidogrel (PLAVIX) 75 MG tablet Take 1 tablet (75 mg total) by mouth daily. 90 tablet 3   Continuous Blood Gluc Sensor (DEXCOM G6 SENSOR) MISC USE 1 SENSOR AS NEEDED CHANGE EVERY 10 DAYS      Continuous Blood Gluc Transmit (DEXCOM G6 TRANSMITTER) MISC      cromolyn (OPTICROM) 4 % ophthalmic solution Place 1 drop into both eyes 4 (four) times daily. INSTILL 1 DROP INTO BOTH EYES UP TO 4 TIMES DAILY AS NEEDED FOR  RED, ITCHY, OR WATERY EYES 60 mL 1   empagliflozin (JARDIANCE) 10 MG TABS tablet Take 1 tablet (10 mg total) by mouth daily before breakfast. 30 tablet 3   EPINEPHrine (EPIPEN 2-PAK) 0.3 mg/0.3 mL IJ SOAJ injection Use as directed for severe allergic reactions 2 each 3   FASENRA PEN 30 MG/ML SOAJ Inject into the skin.     gabapentin (NEURONTIN) 300 MG capsule TAKE 1 CAPSULE BY MOUTH 3 TIMES  DAILY 270 capsule 1   glucagon 1 MG injection Inject 1 mg into the muscle once as needed (low blood sugar).     glucose 4 GM chewable tablet Chew 1 tablet by mouth once as needed for low blood sugar.     HUMALOG 100 UNIT/ML injection Inject into the skin.     HUMULIN N 100 UNIT/ML injection Inject 60 Units into the skin 3 (three) times daily before meals.     Insulin Disposable Pump (OMNIPOD 5 G6 INTRO, GEN 5,) KIT CHANGE POD EVERY 2 DAYS     isosorbide mononitrate (IMDUR) 60 MG 24 hr tablet Take 1 tablet (60 mg total) by mouth daily. 90 tablet 3   levalbuterol (XOPENEX HFA) 45 MCG/ACT inhaler USE 2 INHALATIONS BY MOUTH EVERY 4 TO 6 HOURS AS NEEDED FOR  SHORTNESS OF BREATH , COUGH,  WHEEZE AND TIGHTNESS IN CHEST 45 g 0   levocetirizine (XYZAL) 5 MG tablet TAKE 1 TABLET BY MOUTH EVERY  EVENING IF NEEDED 100 tablet 3   lisinopril (ZESTRIL) 20 MG tablet Take 1 tablet (20 mg total) by mouth daily. 90 tablet 3   methocarbamol (ROBAXIN) 500 MG tablet Take 1 tablet (500 mg total) by mouth every 8 (eight) hours as needed for muscle spasms. 20 tablet 0   metoprolol tartrate (LOPRESSOR) 50 MG tablet Take 1 tablet (50 mg total) by mouth every 12 (twelve) hours. 180 tablet 3   montelukast (SINGULAIR) 10 MG tablet TAKE 1 TABLET BY MOUTH AT  BEDTIME 100 tablet 1   nitroGLYCERIN (NITROSTAT) 0.4 MG SL  tablet Place 1 tablet (0.4 mg total) under the tongue every 5 (five) minutes as needed for chest pain. 25 tablet 3   OZEMPIC, 2 MG/DOSE, 8 MG/3ML SOPN Inject 2 mg into the skin once a week.     pantoprazole (PROTONIX) 40 MG tablet TAKE 1 TABLET BY MOUTH IN THE  MORNING AND AT BEDTIME (Patient taking differently: Take 40 mg by mouth 2 (two) times daily.) 200 tablet 2   potassium chloride (KLOR-CON) 10 MEQ tablet Take 1 tablet (10 mEq total) by mouth daily. 90 tablet 3   Respiratory Therapy Supplies (CARETOUCH 2 CPAP HOSE HANGER) MISC by Does not apply route.     rosuvastatin (CRESTOR) 40 MG tablet Take 1 tablet (40 mg total) by mouth at bedtime. 90 tablet 3   silver sulfADIAZINE (SILVADENE) 1 % cream Apply 1 Application topically daily. (Patient not taking: Reported on 05/30/2022) 50 g 0   Facility-Administered Medications Prior to Visit  Medication Dose Route  Frequency Provider Last Rate Last Admin   Benralizumab SOSY 30 mg  30 mg Subcutaneous Q8 Lynnae January, FNP   30 mg at 05/19/22 1030    Review of Systems  Constitutional:  Negative for chills, fever, malaise/fatigue and weight loss.  HENT:  Negative for hearing loss, sore throat and tinnitus.   Eyes:  Negative for blurred vision and double vision.  Respiratory:  Positive for shortness of breath. Negative for cough, hemoptysis, sputum production, wheezing and stridor.   Cardiovascular:  Negative for chest pain, palpitations, orthopnea, leg swelling and PND.  Gastrointestinal:  Negative for abdominal pain, constipation, diarrhea, heartburn, nausea and vomiting.  Genitourinary:  Negative for dysuria, hematuria and urgency.  Musculoskeletal:  Negative for joint pain and myalgias.  Skin:  Negative for itching and rash.  Neurological:  Negative for dizziness, tingling, weakness and headaches.  Endo/Heme/Allergies:  Negative for environmental allergies. Does not bruise/bleed easily.  Psychiatric/Behavioral:  Negative for depression. The  patient is not nervous/anxious and does not have insomnia.   All other systems reviewed and are negative.    Objective:  Physical Exam Vitals reviewed.  Constitutional:      General: She is not in acute distress.    Appearance: She is well-developed. She is obese.  HENT:     Head: Normocephalic and atraumatic.  Eyes:     General: No scleral icterus.    Conjunctiva/sclera: Conjunctivae normal.     Pupils: Pupils are equal, round, and reactive to light.  Neck:     Vascular: No JVD.     Trachea: No tracheal deviation.  Cardiovascular:     Rate and Rhythm: Normal rate and regular rhythm.     Heart sounds: Normal heart sounds. No murmur heard. Pulmonary:     Effort: Pulmonary effort is normal. No tachypnea, accessory muscle usage or respiratory distress.     Breath sounds: No stridor. No wheezing, rhonchi or rales.  Abdominal:     General: There is no distension.     Palpations: Abdomen is soft.     Tenderness: There is no abdominal tenderness.  Musculoskeletal:        General: No tenderness.     Cervical back: Neck supple.  Lymphadenopathy:     Cervical: No cervical adenopathy.  Skin:    General: Skin is warm and dry.     Capillary Refill: Capillary refill takes less than 2 seconds.     Findings: No rash.  Neurological:     Mental Status: She is alert and oriented to person, place, and time.  Psychiatric:        Behavior: Behavior normal.      Vitals:   06/19/22 1605  BP: 130/80  Pulse: 80  SpO2: 95%  Weight: 249 lb 3.2 oz (113 kg)  Height: 5\' 5"  (1.651 m)   95% on RA BMI Readings from Last 3 Encounters:  06/19/22 41.47 kg/m  05/09/22 40.40 kg/m  05/08/22 40.37 kg/m   Wt Readings from Last 3 Encounters:  06/19/22 249 lb 3.2 oz (113 kg)  05/09/22 242 lb 12.8 oz (110.1 kg)  05/08/22 242 lb 9.6 oz (110 kg)     CBC    Component Value Date/Time   WBC 6.9 03/24/2022 1408   RBC 4.66 03/24/2022 1408   HGB 12.4 03/24/2022 1408   HGB 12.3 01/24/2022 1055    HCT 38.9 03/24/2022 1408   PLT 320.0 03/24/2022 1408   PLT 237 01/24/2022 1055   MCV 83.4 03/24/2022 1408  MCH 25.9 (L) 01/24/2022 1055   MCHC 31.9 03/24/2022 1408   RDW 16.7 (H) 03/24/2022 1408   LYMPHSABS 2.8 03/24/2022 1408   MONOABS 0.5 03/24/2022 1408   EOSABS 0.0 03/24/2022 1408   BASOSABS 0.1 03/24/2022 1408     Chest Imaging:  No recent axial CT imaging.  October 2023 chest x-ray: Unchanged ill-defined opacities in the right base, likely atelectasis The patient's images have been independently reviewed by me.    HRCT imaging April 2024: No significant parenchymal disease identified. The patient's images have been independently reviewed by me.    Pulmonary Functions Testing Results:    Latest Ref Rng & Units 06/19/2022    2:51 PM  PFT Results  FVC-Pre L 2.00  P  FVC-Predicted Pre % 62  P  FVC-Post L 1.90  P  FVC-Predicted Post % 59  P  Pre FEV1/FVC % % 89  P  Post FEV1/FCV % % 82  P  FEV1-Pre L 1.78  P  FEV1-Predicted Pre % 73  P  FEV1-Post L 1.57  P  DLCO uncorrected ml/min/mmHg 12.35  P  DLCO UNC% % 60  P  DLCO corrected ml/min/mmHg 12.35  P  DLCO COR %Predicted % 60  P  DLVA Predicted % 118  P  TLC L 4.81  P  TLC % Predicted % 92  P  RV % Predicted % 136  P    P Preliminary result    FeNO:   Pathology:   Echocardiogram:   Heart Catheterization:     Assessment & Plan:     ICD-10-CM   1. Seasonal and perennial allergic rhinoconjunctivitis  J30.2    H10.10    J30.89     2. Asthma-COPD overlap syndrome  J44.89     3. 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  She does have impaired spirometry and a low DLCO.  No significant evidence of interstitial lung disease on CT imaging but she does have areas of air trapping and small airway disease which is likely related to her obesity and history of  asthma.  Plan: Continue Fasenra per allergist Continue triple therapy inhaler regimen Continue albuterol as needed. Continue weight loss management. Patient to follow-up with [place] in 1 year or as needed.    Current Outpatient Medications:    albuterol (PROVENTIL) (2.5 MG/3ML) 0.083% nebulizer solution, Take 3 mLs (2.5 mg total) by nebulization every 6 (six) hours as needed for wheezing or shortness of breath., Disp: 75 mL, Rfl: 2   albuterol (VENTOLIN HFA) 108 (90 Base) MCG/ACT inhaler, Inhale 2 puffs into the lungs every 6 (six) hours as needed for wheezing., Disp: 18 g, Rfl: 1   BD AUTOSHIELD DUO 30G X 5 MM MISC, 1 KIT BY MISC ROUTE 3 TIMES DAILY BEFORE MEALS, Disp: , Rfl:    benzonatate (TESSALON) 200 MG capsule, Take 1 capsule (200 mg total) by mouth 2 (two) times daily as needed for cough., Disp: 20 capsule, Rfl: 0   Budeson-Glycopyrrol-Formoterol (BREZTRI AEROSPHERE) 160-9-4.8 MCG/ACT AERO, INHALE 2 INHALATIONS BY MOUTH  TWICE DAILY TO PREVENT COUGH OR  WHEEZE. RINSE, GARGLE, AND SPIT  AFTER USE, Disp: 32.1 g, Rfl: 1   bumetanide (BUMEX) 1 MG tablet, TAKE 1 TABLET(1 MG) BY MOUTH DAILY, Disp: 30 tablet, Rfl: 0   cetirizine (ZYRTEC) 10 MG tablet, Take 1  tablet by mouth daily., Disp: , Rfl:    Cholecalciferol 50 MCG (2000 UT) TABS, Take 2,000 Units by mouth daily., Disp: , Rfl:    clopidogrel (PLAVIX) 75 MG tablet, Take 1 tablet (75 mg total) by mouth daily., Disp: 90 tablet, Rfl: 3   Continuous Blood Gluc Sensor (DEXCOM G6 SENSOR) MISC, USE 1 SENSOR AS NEEDED CHANGE EVERY 10 DAYS, Disp: , Rfl:    Continuous Blood Gluc Transmit (DEXCOM G6 TRANSMITTER) MISC, , Disp: , Rfl:    cromolyn (OPTICROM) 4 % ophthalmic solution, Place 1 drop into both eyes 4 (four) times daily. INSTILL 1 DROP INTO BOTH EYES UP TO 4 TIMES DAILY AS NEEDED FOR  RED, ITCHY, OR WATERY EYES, Disp: 60 mL, Rfl: 1   empagliflozin (JARDIANCE) 10 MG TABS tablet, Take 1 tablet (10 mg total) by mouth daily before breakfast., Disp:  30 tablet, Rfl: 3   EPINEPHrine (EPIPEN 2-PAK) 0.3 mg/0.3 mL IJ SOAJ injection, Use as directed for severe allergic reactions, Disp: 2 each, Rfl: 3   FASENRA PEN 30 MG/ML SOAJ, Inject into the skin., Disp: , Rfl:    gabapentin (NEURONTIN) 300 MG capsule, TAKE 1 CAPSULE BY MOUTH 3 TIMES  DAILY, Disp: 270 capsule, Rfl: 1   glucagon 1 MG injection, Inject 1 mg into the muscle once as needed (low blood sugar)., Disp: , Rfl:    glucose 4 GM chewable tablet, Chew 1 tablet by mouth once as needed for low blood sugar., Disp: , Rfl:    HUMALOG 100 UNIT/ML injection, Inject into the skin., Disp: , Rfl:    HUMULIN N 100 UNIT/ML injection, Inject 60 Units into the skin 3 (three) times daily before meals., Disp: , Rfl:    Insulin Disposable Pump (OMNIPOD 5 G6 INTRO, GEN 5,) KIT, CHANGE POD EVERY 2 DAYS, Disp: , Rfl:    isosorbide mononitrate (IMDUR) 60 MG 24 hr tablet, Take 1 tablet (60 mg total) by mouth daily., Disp: 90 tablet, Rfl: 3   levalbuterol (XOPENEX HFA) 45 MCG/ACT inhaler, USE 2 INHALATIONS BY MOUTH EVERY 4 TO 6 HOURS AS NEEDED FOR  SHORTNESS OF BREATH , COUGH,  WHEEZE AND TIGHTNESS IN CHEST, Disp: 45 g, Rfl: 0   levocetirizine (XYZAL) 5 MG tablet, TAKE 1 TABLET BY MOUTH EVERY  EVENING IF NEEDED, Disp: 100 tablet, Rfl: 3   lisinopril (ZESTRIL) 20 MG tablet, Take 1 tablet (20 mg total) by mouth daily., Disp: 90 tablet, Rfl: 3   methocarbamol (ROBAXIN) 500 MG tablet, Take 1 tablet (500 mg total) by mouth every 8 (eight) hours as needed for muscle spasms., Disp: 20 tablet, Rfl: 0   metoprolol tartrate (LOPRESSOR) 50 MG tablet, Take 1 tablet (50 mg total) by mouth every 12 (twelve) hours., Disp: 180 tablet, Rfl: 3   montelukast (SINGULAIR) 10 MG tablet, TAKE 1 TABLET BY MOUTH AT  BEDTIME, Disp: 100 tablet, Rfl: 1   nitroGLYCERIN (NITROSTAT) 0.4 MG SL tablet, Place 1 tablet (0.4 mg total) under the tongue every 5 (five) minutes as needed for chest pain., Disp: 25 tablet, Rfl: 3   OZEMPIC, 2 MG/DOSE, 8  MG/3ML SOPN, Inject 2 mg into the skin once a week., Disp: , Rfl:    pantoprazole (PROTONIX) 40 MG tablet, TAKE 1 TABLET BY MOUTH IN THE  MORNING AND AT BEDTIME (Patient taking differently: Take 40 mg by mouth 2 (two) times daily.), Disp: 200 tablet, Rfl: 2   potassium chloride (KLOR-CON) 10 MEQ tablet, Take 1 tablet (10 mEq total) by mouth daily.,  Disp: 90 tablet, Rfl: 3   Respiratory Therapy Supplies (CARETOUCH 2 CPAP HOSE HANGER) MISC, by Does not apply route., Disp: , Rfl:    rosuvastatin (CRESTOR) 40 MG tablet, Take 1 tablet (40 mg total) by mouth at bedtime., Disp: 90 tablet, Rfl: 3   silver sulfADIAZINE (SILVADENE) 1 % cream, Apply 1 Application topically daily. (Patient not taking: Reported on 05/30/2022), Disp: 50 g, Rfl: 0  Current Facility-Administered Medications:    Benralizumab SOSY 30 mg, 30 mg, Subcutaneous, Q8 Lynnae January, FNP, 30 mg at 05/19/22 1030  Josephine Igo, DO  Pulmonary Critical Care 06/19/2022 4:37 PM

## 2022-06-23 ENCOUNTER — Other Ambulatory Visit: Payer: Self-pay | Admitting: Cardiology

## 2022-06-25 ENCOUNTER — Ambulatory Visit: Payer: Self-pay

## 2022-06-25 NOTE — Patient Outreach (Signed)
  Care Coordination   Follow Up Visit Note   06/25/2022 Name: TIANNA BAUS MRN: [id number] DOB: [birth year]  Katrina Vega is a 67 y.o. year old female who sees Zola Button, Grayling Congress, DO for primary care. I spoke with  Katrina Vega by phone today.  What matters to the patients health and wellness today?  RNCM spoke with Katrina Vega who reports she is doing alright. However, she was at a hair appointment. RNCM offered to reschedule telephone visit in order to maintain HIPPA compliance.  Goals Addressed             This Visit's Progress    Health Management education       Interventions Today    Flowsheet Row Most Recent Value  General Interventions   General Interventions Discussed/Reviewed General Interventions Reviewed  [Telephone assessment rescheduled]            SDOH assessments and interventions completed:  No  Care Coordination Interventions:  Yes, provided   Follow up plan: Follow up call scheduled for 07/04/22    Encounter Outcome:  Pt. Visit Completed   Kathyrn Sheriff, RN, MSN, BSN, CCM [facility] Care Coordinator [phone]

## 2022-06-26 ENCOUNTER — Ambulatory Visit (INDEPENDENT_AMBULATORY_CARE_PROVIDER_SITE_OTHER): Payer: 59

## 2022-06-26 ENCOUNTER — Telehealth: Payer: Self-pay

## 2022-06-26 DIAGNOSIS — M9902 Segmental and somatic dysfunction of thoracic region: Secondary | ICD-10-CM | POA: Diagnosis not present

## 2022-06-26 DIAGNOSIS — J309 Allergic rhinitis, unspecified: Secondary | ICD-10-CM | POA: Diagnosis not present

## 2022-06-26 DIAGNOSIS — M9903 Segmental and somatic dysfunction of lumbar region: Secondary | ICD-10-CM | POA: Diagnosis not present

## 2022-06-26 NOTE — Telephone Encounter (Signed)
Called patient - [facility] DPR verified - No [facility] DPR on file - LMOVM advising of provider notation below.

## 2022-06-26 NOTE — Telephone Encounter (Signed)
Please call patient back and let her know that she can't get Harrington Challenger on the same day while building up on her allergy shots.  She can do at home injections with the Harrington Challenger if she's interested.

## 2022-06-26 NOTE — Telephone Encounter (Signed)
Patient came in for her allergy injections and wants to know if she can get her allergy injects and Fasenra at the same time to save her a trip.

## 2022-06-30 ENCOUNTER — Inpatient Hospital Stay (HOSPITAL_BASED_OUTPATIENT_CLINIC_OR_DEPARTMENT_OTHER): Payer: 59 | Admitting: Family

## 2022-06-30 ENCOUNTER — Other Ambulatory Visit: Payer: Self-pay

## 2022-06-30 ENCOUNTER — Inpatient Hospital Stay: Payer: 59 | Attending: Family

## 2022-06-30 ENCOUNTER — Encounter: Payer: Self-pay | Admitting: Family

## 2022-06-30 VITALS — BP 92/59 | HR 82 | Temp 98.5°F | Resp 18 | Ht 65.0 in | Wt 245.8 lb

## 2022-06-30 DIAGNOSIS — D509 Iron deficiency anemia, unspecified: Secondary | ICD-10-CM | POA: Insufficient documentation

## 2022-06-30 LAB — IRON AND IRON BINDING CAPACITY (CC-WL,HP ONLY)
Iron: 69 ug/dL (ref 28–170)
Saturation Ratios: 14 % (ref 10.4–31.8)
TIBC: 479 ug/dL — ABNORMAL HIGH (ref 250–450)
UIBC: 410 ug/dL (ref 148–442)

## 2022-06-30 LAB — CBC WITH DIFFERENTIAL (CANCER CENTER ONLY)
Abs Immature Granulocytes: 0.03 10*3/uL (ref 0.00–0.07)
Basophils Absolute: 0.1 10*3/uL (ref 0.0–0.1)
Basophils Relative: 1 %
Eosinophils Absolute: 0.4 10*3/uL (ref 0.0–0.5)
Eosinophils Relative: 6 %
HCT: 42.9 % (ref 36.0–46.0)
Hemoglobin: 13.5 g/dL (ref 12.0–15.0)
Immature Granulocytes: 0 %
Lymphocytes Relative: 41 %
Lymphs Abs: 3 10*3/uL (ref 0.7–4.0)
MCH: 26.6 pg (ref 26.0–34.0)
MCHC: 31.5 g/dL (ref 30.0–36.0)
MCV: 84.6 fL (ref 80.0–100.0)
Monocytes Absolute: 0.6 10*3/uL (ref 0.1–1.0)
Monocytes Relative: 8 %
Neutro Abs: 3.1 10*3/uL (ref 1.7–7.7)
Neutrophils Relative %: 44 %
Platelet Count: 323 10*3/uL (ref 150–400)
RBC: 5.07 MIL/uL (ref 3.87–5.11)
RDW: 17 % — ABNORMAL HIGH (ref 11.5–15.5)
WBC Count: 7.2 10*3/uL (ref 4.0–10.5)
nRBC: 0 % (ref 0.0–0.2)

## 2022-06-30 LAB — RETICULOCYTES
Immature Retic Fract: 20.4 % — ABNORMAL HIGH (ref 2.3–15.9)
RBC.: 5.07 MIL/uL (ref 3.87–5.11)
Retic Count, Absolute: 121.2 10*3/uL (ref 19.0–186.0)
Retic Ct Pct: 2.4 % (ref 0.4–3.1)

## 2022-06-30 LAB — FERRITIN: Ferritin: 11 ng/mL (ref 11–307)

## 2022-06-30 NOTE — Progress Notes (Signed)
Hematology and Oncology Follow Up Visit  Katrina Vega [id number] 09-Feb-1956 67 y.o. 06/30/2022   Principle Diagnosis:  Iron deficiency anemia    Current Therapy:        IV iron as indicated    Interim History:  Katrina Vega is here today for follow-up. She is doing well and has no complaints at this time. She has a new beau and is quite happy.  No issue with blood loss. No abnormal bruising, no petechiae.  No fever, chills, n/v, cough, rash, dizziness, SOB, chest pain, palpitations, abdominal pain or changes in bowel or bladder habits.  No swelling, tenderness, numbness or tingling in her extremities.  No falls or syncope.  Appetite and hydration are good. Weight is stable at 245 lbs.   ECOG Performance Status: 1 - Symptomatic but completely ambulatory  Medications:  Allergies as of 06/30/2022       Reactions   Contrast Media [iodinated Contrast Media] Shortness Of Breath, Other (See Comments)   sob, chest tight   Morphine Itching   Statins Diarrhea, Other (See Comments)   nausea, diarrhea, myalgias   Ioversol         Medication List        Accurate as of Jun 30, 2022  1:06 PM. If you have any questions, ask your nurse or doctor.          albuterol (2.5 MG/3ML) 0.083% nebulizer solution Commonly known as: PROVENTIL Take 3 mLs (2.5 mg total) by nebulization every 6 (six) hours as needed for wheezing or shortness of breath.   albuterol 108 (90 Base) MCG/ACT inhaler Commonly known as: VENTOLIN HFA Inhale 2 puffs into the lungs every 6 (six) hours as needed for wheezing.   BD AutoShield Duo 30G X 5 MM Misc Generic drug: Insulin Pen Needle 1 KIT BY MISC ROUTE 3 TIMES DAILY BEFORE MEALS   benzonatate 200 MG capsule Commonly known as: TESSALON Take 1 capsule (200 mg total) by mouth 2 (two) times daily as needed for cough.   Breztri Aerosphere 160-9-4.8 MCG/ACT Aero Generic drug: Budeson-Glycopyrrol-Formoterol INHALE 2 INHALATIONS BY MOUTH  TWICE DAILY TO PREVENT  COUGH OR  WHEEZE. RINSE, GARGLE, AND SPIT  AFTER USE   bumetanide 1 MG tablet Commonly known as: BUMEX TAKE 1 TABLET(1 MG) BY MOUTH DAILY   CareTouch 2 CPAP Hose [employer] Misc by Does not apply route.   cetirizine 10 MG tablet Commonly known as: ZYRTEC Take 1 tablet by mouth daily.   Cholecalciferol 50 MCG (2000 UT) Tabs Take 2,000 Units by mouth daily.   clopidogrel 75 MG tablet Commonly known as: PLAVIX Take 1 tablet (75 mg total) by mouth daily.   cromolyn 4 % ophthalmic solution Commonly known as: OPTICROM Place 1 drop into both eyes 4 (four) times daily. INSTILL 1 DROP INTO BOTH EYES UP TO 4 TIMES DAILY AS NEEDED FOR  RED, ITCHY, OR WATERY EYES   Dexcom G6 Sensor Misc USE 1 SENSOR AS NEEDED CHANGE EVERY 10 DAYS   Dexcom G6 Transmitter Misc   empagliflozin 10 MG Tabs tablet Commonly known as: Jardiance Take 1 tablet (10 mg total) by mouth daily before breakfast.   EPINEPHrine 0.3 mg/0.3 mL Soaj injection Commonly known as: EpiPen 2-Pak Use as directed for severe allergic reactions   Fasenra Pen 30 MG/ML Soaj Generic drug: Benralizumab Inject into the skin.   gabapentin 300 MG capsule Commonly known as: NEURONTIN TAKE 1 CAPSULE BY MOUTH 3 TIMES  DAILY   glucagon 1 MG injection  Inject 1 mg into the muscle once as needed (low blood sugar).   glucose 4 GM chewable tablet Chew 1 tablet by mouth once as needed for low blood sugar.   HumaLOG 100 UNIT/ML injection Generic drug: insulin lispro Inject into the skin.   HumuLIN N 100 UNIT/ML injection Generic drug: insulin NPH Human Inject 60 Units into the skin 3 (three) times daily before meals.   isosorbide mononitrate 60 MG 24 hr tablet Commonly known as: IMDUR Take 1 tablet (60 mg total) by mouth daily.   levalbuterol 45 MCG/ACT inhaler Commonly known as: XOPENEX HFA USE 2 INHALATIONS BY MOUTH EVERY 4 TO 6 HOURS AS NEEDED FOR  SHORTNESS OF BREATH , COUGH,  WHEEZE AND TIGHTNESS IN CHEST   levocetirizine 5  MG tablet Commonly known as: XYZAL TAKE 1 TABLET BY MOUTH EVERY  EVENING IF NEEDED   lisinopril 20 MG tablet Commonly known as: ZESTRIL Take 1 tablet (20 mg total) by mouth daily.   methocarbamol 500 MG tablet Commonly known as: ROBAXIN Take 1 tablet (500 mg total) by mouth every 8 (eight) hours as needed for muscle spasms.   metoprolol tartrate 50 MG tablet Commonly known as: LOPRESSOR TAKE 1 TABLET BY MOUTH EVERY 12  HOURS   montelukast 10 MG tablet Commonly known as: SINGULAIR TAKE 1 TABLET BY MOUTH AT  BEDTIME   nitroGLYCERIN 0.4 MG SL tablet Commonly known as: NITROSTAT Place 1 tablet (0.4 mg total) under the tongue every 5 (five) minutes as needed for chest pain.   Omnipod 5 G6 Intro (Gen 5) Kit CHANGE POD EVERY 2 DAYS   Ozempic (2 MG/DOSE) 8 MG/3ML Sopn Generic drug: Semaglutide (2 MG/DOSE) Inject 2 mg into the skin once a week.   pantoprazole 40 MG tablet Commonly known as: PROTONIX TAKE 1 TABLET BY MOUTH IN THE  MORNING AND AT BEDTIME What changed: See the new instructions.   potassium chloride 10 MEQ tablet Commonly known as: KLOR-CON Take 1 tablet (10 mEq total) by mouth daily.   rosuvastatin 40 MG tablet Commonly known as: CRESTOR Take 1 tablet (40 mg total) by mouth at bedtime.   silver sulfADIAZINE 1 % cream Commonly known as: Silvadene Apply 1 Application topically daily.        Allergies:  Allergies  Allergen Reactions   Contrast Media [Iodinated Contrast Media] Shortness Of Breath and Other (See Comments)    sob, chest tight   Morphine Itching   Statins Diarrhea and Other (See Comments)    nausea, diarrhea, myalgias   Ioversol     Past Medical History, Surgical history, Social history, and Family History were reviewed and updated.  Review of Systems: All other 10 point review of systems is negative.   Physical Exam:  vitals were not taken for this visit.   Wt Readings from Last 3 Encounters:  06/19/22 249 lb 3.2 oz (113 kg)   05/09/22 242 lb 12.8 oz (110.1 kg)  05/08/22 242 lb 9.6 oz (110 kg)    Ocular: Sclerae unicteric, pupils equal, round and reactive to light Ear-nose-throat: Oropharynx clear, dentition fair Lymphatic: No cervical or supraclavicular adenopathy Lungs no rales or rhonchi, good excursion bilaterally Heart regular rate and rhythm, no murmur appreciated Abd soft, nontender, positive bowel sounds MSK no focal spinal tenderness, no joint edema Neuro: non-focal, well-oriented, appropriate affect Breasts: Deferred   Lab Results  Component Value Date   WBC 7.2 06/30/2022   HGB 13.5 06/30/2022   HCT 42.9 06/30/2022   MCV 84.6 06/30/2022  PLT 323 06/30/2022   Lab Results  Component Value Date   FERRITIN 16 01/24/2022   IRON 59 01/24/2022   TIBC 442 01/24/2022   UIBC 383 01/24/2022   IRONPCTSAT 13 01/24/2022   Lab Results  Component Value Date   RETICCTPCT 2.4 06/30/2022   RBC 5.07 06/30/2022   RBC 5.07 06/30/2022   No results found for: "KPAFRELGTCHN", "LAMBDASER", "KAPLAMBRATIO" No results found for: "IGGSERUM", "IGA", "IGMSERUM" No results found for: "TOTALPROTELP", "ALBUMINELP", "A1GS", "A2GS", "BETS", "BETA2SER", "GAMS", "MSPIKE", "SPEI"   Chemistry      Component Value Date/Time   NA 141 03/24/2022 1408   K 4.0 03/24/2022 1408   CL 108 03/24/2022 1408   CO2 25 03/24/2022 1408   BUN 18 03/24/2022 1408   CREATININE 1.39 (H) 03/24/2022 1408   CREATININE 0.99 02/12/2021 0849      Component Value Date/Time   CALCIUM 9.5 03/24/2022 1408   ALKPHOS 66 03/24/2022 1408   AST 14 03/24/2022 1408   AST 13 (L) 02/12/2021 0849   ALT 18 03/24/2022 1408   ALT 13 02/12/2021 0849   BILITOT 0.3 03/24/2022 1408   BILITOT 0.5 02/12/2021 0849       Impression and Plan: Ms. Eisenbraun is a very pleasant 67 yo African American female with long history of iron deficiency anemia.  Iron studies are pending.  Follow-up in 5 months.   Eileen Stanford, NP 5/6/20241:06 PM

## 2022-07-01 DIAGNOSIS — M9902 Segmental and somatic dysfunction of thoracic region: Secondary | ICD-10-CM | POA: Diagnosis not present

## 2022-07-01 DIAGNOSIS — M9903 Segmental and somatic dysfunction of lumbar region: Secondary | ICD-10-CM | POA: Diagnosis not present

## 2022-07-02 ENCOUNTER — Telehealth: Payer: Self-pay | Admitting: Family Medicine

## 2022-07-02 DIAGNOSIS — E1165 Type 2 diabetes mellitus with hyperglycemia: Secondary | ICD-10-CM | POA: Diagnosis not present

## 2022-07-02 NOTE — Telephone Encounter (Signed)
Contacted Katrina Vega to schedule their annual wellness visit. Appointment made for 07/30/2022.  Katrina Vega; Care Guide Ambulatory Clinical Support [place] [facility] Direct Dial: [phone]

## 2022-07-02 NOTE — Telephone Encounter (Signed)
Contacted [name] to schedule their annual wellness visit. Call back at later date: SPOKE WITH PATIENT AND HER PHONE KEPT HANGING UP  Katrina Vega; Care Guide Ambulatory Clinical Support [place] [facility] Direct Dial: [phone]

## 2022-07-04 ENCOUNTER — Telehealth: Payer: Self-pay

## 2022-07-04 ENCOUNTER — Ambulatory Visit: Payer: Self-pay

## 2022-07-04 NOTE — Patient Outreach (Signed)
  Care Coordination   07/04/2022 Name: Katrina Vega MRN: [id number] DOB: Dec 07, 1955   Care Coordination Outreach Attempts:  An unsuccessful telephone outreach was attempted for a scheduled appointment today.  Follow Up Plan:  Additional outreach attempts will be made to offer the patient care coordination information and services.   Encounter Outcome:  No Answer   Care Coordination Interventions:  No, not indicated    Kathyrn Sheriff, RN, MSN, BSN, CCM [facility] Care Coordinator [phone]

## 2022-07-04 NOTE — Patient Outreach (Signed)
  Care Coordination   Follow Up Visit Note   07/04/2022 Name: SAIDIE LAYER MRN: [id number] DOB: 06-05-1955  [name] is a 67 y.o. year old female who sees Zola Button, Grayling Congress, DO for primary care. I spoke with  [name] by phone today.  What matters to the patients health and wellness today?  Could only speak with patient briefly as her phone continues to drop calls. She states her A1C is down to 7.2. Ms. Deily to contact RNCM when she gets another phone within the next 1-2 weeks.  Goals Addressed             This Visit's Progress    Health Management education       Interventions Today    Flowsheet Row Most Recent Value  General Interventions   General Interventions Discussed/Reviewed --  [referral to care guide to contact patient to reschedule telephone visit within the next 1-2 weeks.]            SDOH assessments and interventions completed:  No  Care Coordination Interventions:  No, not indicated   Follow up plan: Referral made to care guide to reschedule telephone call in the next 1-2 weeks.    Encounter Outcome:  Pt. Visit Completed   Kathyrn Sheriff, RN, MSN, BSN, CCM [facility] Care Coordinator [phone]

## 2022-07-07 ENCOUNTER — Ambulatory Visit (INDEPENDENT_AMBULATORY_CARE_PROVIDER_SITE_OTHER): Payer: 59 | Admitting: *Deleted

## 2022-07-07 DIAGNOSIS — J309 Allergic rhinitis, unspecified: Secondary | ICD-10-CM | POA: Diagnosis not present

## 2022-07-08 ENCOUNTER — Encounter: Payer: 59 | Admitting: Licensed Clinical Social Worker

## 2022-07-09 ENCOUNTER — Ambulatory Visit: Payer: Self-pay | Admitting: Licensed Clinical Social Worker

## 2022-07-09 NOTE — Patient Instructions (Signed)
Social Work Visit Information  Thank you for taking time to visit with me today. Please don't hesitate to contact me if I can be of assistance to you.   Following are the goals we discussed today:   Goals Addressed             This Visit's Progress    Manage mental health       Activities and task to complete in order to accomplish goals.   Call [facility] [phone] to follow up on scheduling a therapy appointment Enjoy your vacation     Keep all upcoming appointment discussed today Continue with compliance of taking medication prescribed by Doctor Self Support options  (continue working on life balance, pouring back into yourself and continue with sleep hygiene)        Our next appointment is by telephone on 07/28/22/ at 11:00   Please call the care guide team at [phone] if you need to cancel or reschedule your appointment.   If you or anyone you know are experiencing a Mental Health or Behavioral Health Crisis or need someone to talk to, please call the Suicide and Crisis Lifeline: 988 call the [place] National Suicide Prevention Lifeline: 610-470-5086 or TTY: 802-667-8994 TTY 431-591-0499) to talk to a trained counselor call 1-800-273-TALK (toll free, 24 hour hotline) go to [facility] [address], [place] [phone])   Patient verbalizes understanding of instructions and care plan provided today and agrees to view in MyChart. Active MyChart status and patient understanding of how to access instructions and care plan via MyChart confirmed with patient.      Sammuel Hines, LCSW Social Work Care Coordination  [facility] [employer] [phone]

## 2022-07-09 NOTE — Patient Outreach (Signed)
  Care Coordination  Follow Up Visit Note   07/09/2022 Name: Katrina Vega MRN: [id number] DOB: 03/16/1955  Katrina Vega is a 67 y.o. year old female who sees Zola Button, Grayling Congress, DO for primary care. I spoke with  Katrina Vega by phone today.  What matters to the patients health and wellness today?  Managing her physical and mental health  Patient is making progress with managing current stressors.  She is ready to move to the next level and seek ongoing therapy for her mental health needs.  Goals Addressed             This Visit's Progress    Manage mental health       Activities and task to complete in order to accomplish goals.   Call [facility] [phone] to follow up on scheduling a therapy appointment Enjoy your vacation     Keep all upcoming appointment discussed today Continue with compliance of taking medication prescribed by Doctor Self Support options  (continue working on life balance, pouring back into yourself and continue with sleep hygiene)        SDOH assessments and interventions completed:  Yes  SDOH Interventions Today    Flowsheet Row Most Recent Value  SDOH Interventions   Stress Interventions Provide Counseling       Care Coordination Interventions:  Yes, provided  Interventions Today    Flowsheet Row Most Recent Value  Chronic Disease   Chronic disease during today's visit Congestive Heart Failure (CHF), Chronic Obstructive Pulmonary Disease (COPD), Chronic Kidney Disease/End Stage Renal Disease (ESRD)  General Interventions   General Interventions Discussed/Reviewed General Interventions Reviewed, Communication with  Communication with --  [therapist offices to find new therapist ( 3 way calls)]  Education Interventions   Education Provided Provided Education  Provided Verbal Education On Mental Health/Coping with Illness  Mental Health Interventions   Mental Health Discussed/Reviewed Mental Health Reviewed        Follow up plan: Follow up call scheduled for 07/28/22    Encounter Outcome:  Pt. Visit Completed   Sammuel Hines, LCSW Social Work Care Coordination  [facility] [employer] [employer] [phone]

## 2022-07-10 ENCOUNTER — Other Ambulatory Visit: Payer: Self-pay | Admitting: Cardiology

## 2022-07-14 ENCOUNTER — Ambulatory Visit (INDEPENDENT_AMBULATORY_CARE_PROVIDER_SITE_OTHER): Payer: 59 | Admitting: *Deleted

## 2022-07-14 DIAGNOSIS — J4550 Severe persistent asthma, uncomplicated: Secondary | ICD-10-CM | POA: Diagnosis not present

## 2022-07-15 DIAGNOSIS — M9902 Segmental and somatic dysfunction of thoracic region: Secondary | ICD-10-CM | POA: Diagnosis not present

## 2022-07-15 DIAGNOSIS — M9903 Segmental and somatic dysfunction of lumbar region: Secondary | ICD-10-CM | POA: Diagnosis not present

## 2022-07-17 ENCOUNTER — Other Ambulatory Visit: Payer: Self-pay | Admitting: Family

## 2022-07-17 ENCOUNTER — Other Ambulatory Visit: Payer: Self-pay | Admitting: Family Medicine

## 2022-07-20 ENCOUNTER — Other Ambulatory Visit: Payer: Self-pay | Admitting: Family

## 2022-07-25 ENCOUNTER — Other Ambulatory Visit: Payer: Self-pay | Admitting: Family Medicine

## 2022-07-25 DIAGNOSIS — K2100 Gastro-esophageal reflux disease with esophagitis, without bleeding: Secondary | ICD-10-CM

## 2022-07-25 MED ORDER — PANTOPRAZOLE SODIUM 40 MG PO TBEC
40.0000 mg | DELAYED_RELEASE_TABLET | Freq: Two times a day (BID) | ORAL | 3 refills | Status: DC
Start: 2022-07-25 — End: 2022-09-23

## 2022-07-28 ENCOUNTER — Ambulatory Visit: Payer: Self-pay | Admitting: Licensed Clinical Social Worker

## 2022-07-28 NOTE — Patient Outreach (Signed)
  Care Coordination   Follow Up Visit Note   07/28/2022 Name: Katrina Vega MRN: [id number] DOB: 05/26/1955  Katrina Vega is a 67 y.o. year old female who sees Zola Button, Grayling Congress, DO for primary care. I spoke with  Katrina Vega by phone today.  What matters to the patients health and wellness today?  Following up on getting therapy appointment scheduled.  Patient reports she is doing well, currently on vacation this week and has not called to schedule therapy appointment.  Report she will call within the next few weeks   Goals Addressed             This Visit's Progress    Manage mental health       Activities and task to complete in order to accomplish goals.   Call [facility] [phone] to schedule a therapy appointment Enjoy your vacation    Keep all upcoming appointment discussed today Continue with compliance of taking medication prescribed by Doctor Self Support options  (continue working on life balance, pouring back into yourself and continue with sleep hygiene)        SDOH assessments and interventions completed:  No   Care Coordination Interventions:  Yes, provided  Interventions Today    Flowsheet Row Most Recent Value  Chronic Disease   Chronic disease during today's visit Congestive Heart Failure (CHF), Chronic Obstructive Pulmonary Disease (COPD), Diabetes, Chronic Kidney Disease/End Stage Renal Disease (ESRD)  General Interventions   General Interventions Discussed/Reviewed General Interventions Reviewed  [reminded patient of upcome appointments]  Mental Health Interventions   Mental Health Discussed/Reviewed Mental Health Discussed, Mental Health Reviewed  [solution focused, active listening and task centered provided]       Follow up plan: Follow up call scheduled for 08/19/22    Encounter Outcome:  Pt. Visit Completed   Sammuel Hines, LCSW Social Work Care Coordination  [facility] [employer] [employer] [phone]

## 2022-07-28 NOTE — Patient Instructions (Signed)
Social Work Visit Information  Thank you for taking time to visit with me today. Please don't hesitate to contact me if I can be of assistance to you.   Following are the goals we discussed today:   Goals Addressed             This Visit's Progress    Manage mental health       Activities and task to complete in order to accomplish goals.   Call [facility] [phone] to schedule a therapy appointment Enjoy your vacation    Keep all upcoming appointment discussed today Continue with compliance of taking medication prescribed by Doctor Self Support options  (continue working on life balance, pouring back into yourself and continue with sleep hygiene)         Our next appointment is by telephone on 08/19/22 at 11:00  Please call the care guide team at [phone] if you need to cancel or reschedule your appointment.   If you or anyone you know are experiencing a Mental Health or Behavioral Health Crisis or need someone to talk to, please call the Suicide and Crisis Lifeline: 988 call the [place] National Suicide Prevention Lifeline: 731 041 1640 or TTY: 276-629-6381 TTY 269-451-8044) to talk to a trained counselor call 1-800-273-TALK (toll free, 24 hour hotline)   Patient verbalizes understanding of instructions and care plan provided today and agrees to view in MyChart. Active MyChart status and patient understanding of how to access instructions and care plan via MyChart confirmed with patient.      Sammuel Hines, LCSW Social Work Care Coordination  [facility] [employer] [phone]

## 2022-07-30 ENCOUNTER — Ambulatory Visit (INDEPENDENT_AMBULATORY_CARE_PROVIDER_SITE_OTHER): Payer: 59 | Admitting: *Deleted

## 2022-07-30 ENCOUNTER — Other Ambulatory Visit: Payer: Self-pay | Admitting: Cardiology

## 2022-07-30 DIAGNOSIS — Z0000 Encounter for general adult medical examination without abnormal findings: Secondary | ICD-10-CM | POA: Diagnosis not present

## 2022-07-30 NOTE — Patient Instructions (Signed)
Katrina Vega , Thank you for taking time to come for your Medicare Wellness Visit. I appreciate your ongoing commitment to your health goals. Please review the following plan we discussed and let me know if I can assist you in the future.      This is a list of the screening recommended for you and due dates:  Health Maintenance  Topic Date Due   Eye exam for diabetics  Never done   Pneumonia Vaccine (2 of 2 - PCV) 01/14/2019   COVID-19 Vaccine (3 - 2023-24 season) 10/25/2021   Complete foot exam   11/21/2021   Hemoglobin A1C  06/21/2022   Mammogram  08/24/2022   Flu Shot  09/25/2022   Yearly kidney function blood test for diabetes  03/25/2023   Yearly kidney health urinalysis for diabetes  03/25/2023   Medicare Annual Wellness Visit  07/30/2023   Colon Cancer Screening  02/23/2031   DTaP/Tdap/Td vaccine (3 - Td or Tdap) 03/24/2032   DEXA scan (bone density measurement)  Completed   Hepatitis C Screening  Completed   Zoster (Shingles) Vaccine  Completed   HPV Vaccine  Aged Out    Next appointment: Follow up in one year for your annual wellness visit.   Preventive Care 69 Years and Older, Female Preventive care refers to lifestyle choices and visits with your health care provider that can promote health and wellness. What does preventive care include? A yearly physical exam. This is also called an annual well check. Dental exams once or twice a year. Routine eye exams. Ask your health care provider how often you should have your eyes checked. Personal lifestyle choices, including: Daily care of your teeth and gums. Regular physical activity. Eating a healthy diet. Avoiding tobacco and drug use. Limiting alcohol use. Practicing safe sex. Taking low-dose aspirin every day. Taking vitamin and mineral supplements as recommended by your health care provider. What happens during an annual well check? The services and screenings done by your health care provider during your annual  well check will depend on your age, overall health, lifestyle risk factors, and family history of disease. Counseling  Your health care provider may ask you questions about your: Alcohol use. Tobacco use. Drug use. Emotional well-being. Home and relationship well-being. Sexual activity. Eating habits. History of falls. Memory and ability to understand (cognition). Work and work Astronomer. Reproductive health. Screening  You may have the following tests or measurements: Height, weight, and BMI. Blood pressure. Lipid and cholesterol levels. These may be checked every 5 years, or more frequently if you are over 42 years old. Skin check. Lung cancer screening. You may have this screening every year starting at age 69 if you have a 30-pack-year history of smoking and currently smoke or have quit within the past 15 years. Fecal occult blood test (FOBT) of the stool. You may have this test every year starting at age 69. Flexible sigmoidoscopy or colonoscopy. You may have a sigmoidoscopy every 5 years or a colonoscopy every 10 years starting at age 5. Hepatitis C blood test. Hepatitis B blood test. Sexually transmitted disease (STD) testing. Diabetes screening. This is done by checking your blood sugar (glucose) after you have not eaten for a while (fasting). You may have this done every 1-3 years. Bone density scan. This is done to screen for osteoporosis. You may have this done starting at age 69. Mammogram. This may be done every 1-2 years. Talk to your health care provider about how often you should have  regular mammograms. Talk with your health care provider about your test results, treatment options, and if necessary, the need for more tests. Vaccines  Your health care provider may recommend certain vaccines, such as: Influenza vaccine. This is recommended every year. Tetanus, diphtheria, and acellular pertussis (Tdap, Td) vaccine. You may need a Td booster every 10 years. Zoster  vaccine. You may need this after age 69. Pneumococcal 13-valent conjugate (PCV13) vaccine. One dose is recommended after age 18. Pneumococcal polysaccharide (PPSV23) vaccine. One dose is recommended after age 23. Talk to your health care provider about which screenings and vaccines you need and how often you need them. This information is not intended to replace advice given to you by your health care provider. Make sure you discuss any questions you have with your health care provider. Document Released: 03/09/2015 Document Revised: 10/31/2015 Document Reviewed: 12/12/2014 Elsevier Interactive Patient Education  2017 [employer].  Fall Prevention in the Home Falls can cause injuries. They can happen to people of all ages. There are many things you can do to make your home safe and to help prevent falls. What can I do on the outside of my home? Regularly fix the edges of walkways and driveways and fix any cracks. Remove anything that might make you trip as you walk through a door, such as a raised step or threshold. Trim any bushes or trees on the path to your home. Use bright outdoor lighting. Clear any walking paths of anything that might make someone trip, such as rocks or tools. Regularly check to see if handrails are loose or broken. Make sure that both sides of any steps have handrails. Any raised decks and porches should have guardrails on the edges. Have any leaves, snow, or ice cleared regularly. Use sand or salt on walking paths during winter. Clean up any spills in your garage right away. This includes oil or grease spills. What can I do in the bathroom? Use night lights. Install grab bars by the toilet and in the tub and shower. Do not use towel bars as grab bars. Use non-skid mats or decals in the tub or shower. If you need to sit down in the shower, use a plastic, non-slip stool. Keep the floor dry. Clean up any water that spills on the floor as soon as it happens. Remove  soap buildup in the tub or shower regularly. Attach bath mats securely with double-sided non-slip rug tape. Do not have throw rugs and other things on the floor that can make you trip. What can I do in the bedroom? Use night lights. Make sure that you have a light by your bed that is easy to reach. Do not use any sheets or blankets that are too big for your bed. They should not hang down onto the floor. Have a firm chair that has side arms. You can use this for support while you get dressed. Do not have throw rugs and other things on the floor that can make you trip. What can I do in the kitchen? Clean up any spills right away. Avoid walking on wet floors. Keep items that you use a lot in easy-to-reach places. If you need to reach something above you, use a strong step stool that has a grab bar. Keep electrical cords out of the way. Do not use floor polish or wax that makes floors slippery. If you must use wax, use non-skid floor wax. Do not have throw rugs and other things on the floor that  can make you trip. What can I do with my stairs? Do not leave any items on the stairs. Make sure that there are handrails on both sides of the stairs and use them. Fix handrails that are broken or loose. Make sure that handrails are as long as the stairways. Check any carpeting to make sure that it is firmly attached to the stairs. Fix any carpet that is loose or worn. Avoid having throw rugs at the top or bottom of the stairs. If you do have throw rugs, attach them to the floor with carpet tape. Make sure that you have a light switch at the top of the stairs and the bottom of the stairs. If you do not have them, ask someone to add them for you. What else can I do to help prevent falls? Wear shoes that: Do not have high heels. Have rubber bottoms. Are comfortable and fit you well. Are closed at the toe. Do not wear sandals. If you use a stepladder: Make sure that it is fully opened. Do not climb a  closed stepladder. Make sure that both sides of the stepladder are locked into place. Ask someone to hold it for you, if possible. Clearly mark and make sure that you can see: Any grab bars or handrails. First and last steps. Where the edge of each step is. Use tools that help you move around (mobility aids) if they are needed. These include: Canes. Walkers. Scooters. Crutches. Turn on the lights when you go into a dark area. Replace any light bulbs as soon as they burn out. Set up your furniture so you have a clear path. Avoid moving your furniture around. If any of your floors are uneven, fix them. If there are any pets around you, be aware of where they are. Review your medicines with your doctor. Some medicines can make you feel dizzy. This can increase your chance of falling. Ask your doctor what other things that you can do to help prevent falls. This information is not intended to replace advice given to you by your health care provider. Make sure you discuss any questions you have with your health care provider. Document Released: 12/07/2008 Document Revised: 07/19/2015 Document Reviewed: 03/17/2014 Elsevier Interactive Patient Education  2017 [employer].

## 2022-07-30 NOTE — Progress Notes (Signed)
Subjective:   Katrina Vega is a 67 y.o. female who presents for Medicare Annual (Subsequent) preventive examination.  I connected with  [name] on 07/30/22 by a audio enabled telemedicine application and verified that I am speaking with the correct person using two identifiers.  Patient Location: Home  Provider Location: Office/Clinic  I discussed the limitations of evaluation and management by telemedicine. The patient expressed understanding and agreed to proceed.   Review of Systems     Cardiac Risk Factors include: obesity (BMI >30kg/m2);advanced age (>78men, >55 women);diabetes mellitus;dyslipidemia;hypertension     Objective:    Today's Vitals   There is no height or weight on file to calculate BMI.     07/30/2022    1:43 PM 06/30/2022   12:52 PM 01/24/2022   11:11 AM 12/19/2021    8:05 PM 12/09/2021    4:06 PM 09/24/2021   10:51 AM 07/25/2021    9:20 AM  Advanced Directives  Does Patient Have a Medical Advance Directive? No No No No No No No  Would patient like information on creating a medical advance directive? No - Patient declined No - Patient declined No - Patient declined No - Patient declined No - Patient declined No - Patient declined No - Patient declined    Current Medications (verified) Outpatient Encounter Medications as of 07/30/2022  Medication Sig   pantoprazole (PROTONIX) 40 MG tablet Take 1 tablet (40 mg total) by mouth 2 (two) times daily.   albuterol (PROVENTIL) (2.5 MG/3ML) 0.083% nebulizer solution Take 3 mLs (2.5 mg total) by nebulization every 6 (six) hours as needed for wheezing or shortness of breath.   albuterol (VENTOLIN HFA) 108 (90 Base) MCG/ACT inhaler Inhale 2 puffs into the lungs every 6 (six) hours as needed for wheezing.   BD AUTOSHIELD DUO 30G X 5 MM MISC 1 KIT BY MISC ROUTE 3 TIMES DAILY BEFORE MEALS   Budeson-Glycopyrrol-Formoterol (BREZTRI AEROSPHERE) 160-9-4.8 MCG/ACT AERO INHALE 2 INHALATIONS BY MOUTH  TWICE DAILY TO PREVENT  COUGH OR  WHEEZE. RINSE, GARGLE, AND SPIT  AFTER USE   bumetanide (BUMEX) 1 MG tablet TAKE 1 TABLET(1 MG) BY MOUTH DAILY   cetirizine (ZYRTEC) 10 MG tablet Take 1 tablet by mouth daily.   Cholecalciferol 50 MCG (2000 UT) TABS Take 2,000 Units by mouth daily.   clopidogrel (PLAVIX) 75 MG tablet Take 1 tablet (75 mg total) by mouth daily.   Continuous Blood Gluc Sensor (DEXCOM G6 SENSOR) MISC USE 1 SENSOR AS NEEDED CHANGE EVERY 10 DAYS   Continuous Blood Gluc Transmit (DEXCOM G6 TRANSMITTER) MISC    cromolyn (OPTICROM) 4 % ophthalmic solution Place 1 drop into both eyes 4 (four) times daily. INSTILL 1 DROP INTO BOTH EYES UP TO 4 TIMES DAILY AS NEEDED FOR  RED, ITCHY, OR WATERY EYES   empagliflozin (JARDIANCE) 10 MG TABS tablet Take 1 tablet (10 mg total) by mouth daily before breakfast.   EPINEPHrine (EPIPEN 2-PAK) 0.3 mg/0.3 mL IJ SOAJ injection Use as directed for severe allergic reactions   FASENRA PEN 30 MG/ML SOAJ Inject into the skin.   gabapentin (NEURONTIN) 300 MG capsule Take 1 capsule (300 mg total) by mouth 3 (three) times daily.   glucagon 1 MG injection Inject 1 mg into the muscle once as needed (low blood sugar).   glucose 4 GM chewable tablet Chew 1 tablet by mouth once as needed for low blood sugar.   HUMALOG 100 UNIT/ML injection Inject into the skin.   HUMULIN N  100 UNIT/ML injection Inject 60 Units into the skin 3 (three) times daily before meals.   Insulin Disposable Pump (OMNIPOD 5 G6 INTRO, GEN 5,) KIT CHANGE POD EVERY 2 DAYS   isosorbide mononitrate (IMDUR) 60 MG 24 hr tablet Take 1 tablet (60 mg total) by mouth daily.   levalbuterol (XOPENEX HFA) 45 MCG/ACT inhaler USE 2 INHALATIONS BY MOUTH EVERY 4 TO 6 HOURS AS NEEDED FOR  SHORTNESS OF BREATH , COUGH,  WHEEZE AND TIGHTNESS IN CHEST   levocetirizine (XYZAL) 5 MG tablet TAKE 1 TABLET BY MOUTH EVERY  EVENING IF NEEDED   lisinopril (ZESTRIL) 20 MG tablet Take 1 tablet (20 mg total) by mouth daily.   methocarbamol (ROBAXIN)  500 MG tablet Take 1 tablet (500 mg total) by mouth every 8 (eight) hours as needed for muscle spasms.   metoprolol tartrate (LOPRESSOR) 50 MG tablet TAKE 1 TABLET BY MOUTH EVERY 12  HOURS   montelukast (SINGULAIR) 10 MG tablet TAKE 1 TABLET BY MOUTH AT  BEDTIME   nitroGLYCERIN (NITROSTAT) 0.4 MG SL tablet Place 1 tablet (0.4 mg total) under the tongue every 5 (five) minutes as needed for chest pain.   OZEMPIC, 2 MG/DOSE, 8 MG/3ML SOPN Inject 2 mg into the skin once a week.   potassium chloride (KLOR-CON) 10 MEQ tablet Take 1 tablet (10 mEq total) by mouth daily.   Respiratory Therapy Supplies (CARETOUCH 2 CPAP HOSE HANGER) MISC by Does not apply route.   rosuvastatin (CRESTOR) 40 MG tablet Take 1 tablet (40 mg total) by mouth at bedtime.   silver sulfADIAZINE (SILVADENE) 1 % cream Apply 1 Application topically daily.   [DISCONTINUED] benzonatate (TESSALON) 200 MG capsule Take 1 capsule (200 mg total) by mouth 2 (two) times daily as needed for cough.   [DISCONTINUED] pantoprazole (PROTONIX) 40 MG tablet TAKE 1 TABLET BY MOUTH IN THE  MORNING AND AT BEDTIME   Facility-Administered Encounter Medications as of 07/30/2022  Medication   Benralizumab SOSY 30 mg    Allergies (verified) Contrast media [iodinated contrast media], Morphine, Statins, and Ioversol   History: Past Medical History:  Diagnosis Date   Asthma    CAD S/P two-vessel DES PCI 2008   LAD and RI; 2013 PTCA of small RCA.   CHF (congestive heart failure), NYHA class I, chronic, diastolic (HCC) 2019   Recent Echo 12/16/2021: Mild concentric LVH.  EF 59%.  GI 1 DD.  Normal LAP-(Mildly dilated LA;; has converted from to torsemide and now on bumetanide   CKD stage 3 due to type 2 diabetes mellitus (HCC)    COPD (chronic obstructive pulmonary disease) (HCC) 2021   Complicated by asthma and seasonal allergies.   Diabetes mellitus, type II, insulin dependent (HCC) 2010   On insulin 60 units TID Premeal.  Also on Jardiance and Ozempic.    Eczema    Essential hypertension    Heart attack [facility]) 2008   2008-two-vessel PCI; 2013 PTCA only small RCA   History of left hip replacement 04/2018   Hyperlipidemia associated with type 2 diabetes mellitus (HCC)    140 mg rosuvastatin   Insulin pump in place    PAD (peripheral artery disease) (HCC)    Status post bilateral SFA stents and right posterior tibial DES stent   Primary hypertension 01/20/2011      Patient's blood pressure is well controlled.  Continue current medications.   Past Surgical History:  Procedure Laterality Date   AAA DUPLEX  10/09/2020   Max Aorta (sac) diameter 2.51  cm prox with mild ectasia in Prox Aorta. Diffuse plaque in mid-distal Aorta. Bilateral ICA poorly visulailzed - appear to be Severely stenosed with difuse plaque. - Consider CTA of cath directed Angio.   APPENDECTOMY  1974   BIOPSY  02/22/2021   Procedure: BIOPSY;  Surgeon: Jeani Hawking, MD;  Location: [facility];  Service: Endoscopy;;   CARPAL TUNNEL RELEASE  2017   COLONOSCOPY WITH PROPOFOL N/A 02/22/2021   Procedure: COLONOSCOPY WITH PROPOFOL;  Surgeon: Jeani Hawking, MD;  Location: [facility];  Service: Endoscopy;  Laterality: N/A;   CORONARY BALLOON ANGIOPLASTY  2013   PTCA of RCA followed by PCI   CORONARY STENT INTERVENTION  2008   Text DES PCI to LAD and RI/OM1; also prox RCA   ESOPHAGOGASTRODUODENOSCOPY (EGD) WITH PROPOFOL N/A 02/22/2021   Procedure: ESOPHAGOGASTRODUODENOSCOPY (EGD) WITH PROPOFOL;  Surgeon: Jeani Hawking, MD;  Location: [facility];  Service: Endoscopy;  Laterality: N/A;   LEA Dopplers  10/09/2020   R mid & Distal SFA -CTO - dampend flow in R Pop via collaterals - Moderate velocity increase in R PFA. No signficant stenosis in LLE.   R ABI 0.97) - normal. L ABI - 0.91 w/ monophasic flow in L AT -> c/w 11/2019- R SFA new.   LEFT HEART CATH AND CORONARY ANGIOGRAPHY  12/22/2017   Mild LM plaque.  Mild proximal LAD plaque.  High D1-40% ostial.  Patent mid LAD DES  (Taxus from 2008), large distal LAD free disease; Prox LCx-OM1 stents patent (Taxus 2008). Small RCA - patent prox stent(~50-60% ISR), PTCA site from 2013 - patent   LOWER EXTREMITY ANGIOGRAM Bilateral 12/22/2017   Bilateral SFA stents with evidence of severe right and mid left ISR bilateral popliteal arteries proximal trifurcation vessels are patent.  Moderate to severe lesion involving mid R AntTib, poor distal flow in L Ant Tib - 2 V runoff Bilat. --> referred for R SFA Laser Atherectomy & DCB 5 x 120 mm.   LOWER EXTREMITY INTERVENTION Right 12/22/2017   Laser atherectomy of right SFA followed by [facility] with 5.0 x 120 mm impact.-For severe right SFA ISR   LUMBAR SPINE SURGERY  2010   NM MYOVIEW LTD  07/07/2019   [facility]): Lexiscan.  Nondiagnostic EKG.  Dyspnea with effusion.  No ischemia or infarction.  Soft tissue attenuation noted.Marland Kitchen  LVEF 72%.  No RWMA.  LOW RISK.--No change from 11/2017   POLYPECTOMY  02/22/2021   Procedure: POLYPECTOMY;  Surgeon: Jeani Hawking, MD;  Location: [facility];  Service: Endoscopy;;   REPLACEMENT TOTAL KNEE  2017 and 2018   TONSILLECTOMY     TOTAL ABDOMINAL HYSTERECTOMY  1989   TOTAL HIP ARTHROPLASTY  04/2018   TOTAL SHOULDER ARTHROPLASTY  11/2019   TRANSTHORACIC ECHOCARDIOGRAM  07/04/2019   Normal LV size, mild LVH, hyperdynamic LVEF at >65% with grade 1 diastolic dysfunction.  Otherwise no other significant abnormality.  Poor quality due to patient body habitus.   TRANSTHORACIC ECHOCARDIOGRAM  12/16/2021   [facility]) normal LV size and function.  Moderate concentric LVH.  Normal WM.  EF estimated 59%.  GR 1 DD.  Mild LA dilation.  No valvular lesions.   Family History  Problem Relation Age of Onset   Heart disease Mother    Breast cancer Mother    Heart attack Father    Lung cancer Father    Eczema Grandson    Allergic rhinitis Neg Hx    Angioedema Neg Hx    Asthma Neg Hx  Atopy Neg Hx     Immunodeficiency Neg Hx    Urticaria Neg Hx    Social History   Socioeconomic History   Marital status: Single    Spouse name: Not on file   Number of children: 5   Years of education: Not on file   Highest education level: Not on file  Occupational History   Not on file  Tobacco Use   Smoking status: Former    Packs/day: 0.50    Years: 15.00    Additional pack years: 0.00    Total pack years: 7.50    Types: Cigarettes    Quit date: 10/14/1995    Years since quitting: 26.8   Smokeless tobacco: Never  Vaping Use   Vaping Use: Never used  Substance and Sexual Activity   Alcohol use: Not Currently   Drug use: Not Currently   Sexual activity: Yes  Other Topics Concern   Not on file  Social History Narrative   Not on file   Social Determinants of Health   Financial Resource Strain: Low Risk  (07/16/2021)   Overall Financial Resource Strain (CARDIA)    Difficulty of Paying Living Expenses: Not hard at all  Food Insecurity: No Food Insecurity (05/15/2022)   Hunger Vital Sign    Worried About Running Out of Food in the Last Year: Never true    Ran Out of Food in the Last Year: Never true  Transportation Needs: Unknown (05/15/2022)   PRAPARE - Administrator, Civil Service (Medical): Not on file    Lack of Transportation (Non-Medical): No  Physical Activity: Inactive (07/16/2021)   Exercise Vital Sign    Days of Exercise per Week: 0 days    Minutes of Exercise per Session: 0 min  Stress: Stress Concern Present (07/09/2022)   [employer] of Occupational Health - Occupational Stress Questionnaire    Feeling of Stress : To some extent  Social Connections: Moderately Isolated (07/16/2021)   Social Connection and Isolation Panel [NHANES]    Frequency of Communication with Friends and Family: More than three times a week    Frequency of Social Gatherings with Friends and Family: Never    Attends Religious Services: More than 4 times per year    Active Member of  [employer] or Organizations: No    Attends Engineer, structural: Never    Marital Status: Never married    Tobacco Counseling Counseling given: Not Answered   Clinical Intake:  Pre-visit preparation completed: Yes  Pain : No/denies pain  Nutritional Risks: None Diabetes: Yes CBG done?: No Did pt. bring in CBG monitor from home?: No  How often do you need to have someone help you when you read instructions, pamphlets, or other written materials from your doctor or pharmacy?: 1 - Never  Activities of Daily Living    07/30/2022    1:48 PM  In your present state of health, do you have any difficulty performing the following activities:  Hearing? 0  Vision? 0  Difficulty concentrating or making decisions? 1  Comment some forgetfullness  Walking or climbing stairs? 1  Dressing or bathing? 0  Doing errands, shopping? 0  Preparing Food and eating ? N  Using the Toilet? N  In the past six months, have you accidently leaked urine? N  Do you have problems with loss of bowel control? N  Managing your Medications? N  Managing your Finances? N  Housekeeping or managing your Housekeeping? N    Patient  Care Team: Zola Button, Grayling Congress, DO as PCP - General (Family Medicine) Marykay Lex, MD as PCP - Cardiology (Cardiology) Nehemiah Settle, FNP as Nurse Practitioner (Allergy and Immunology) Soundra Pilon, LCSW as Social Worker (Licensed Clinical Social Worker) Colletta Maryland, RN as [facility] Care Management  Indicate any recent Medical Services you may have received from other than [facility] providers in the past year (date may be approximate).     Assessment:   This is a routine wellness examination for Josee.  Hearing/Vision screen No results found.  Dietary issues and exercise activities discussed: Current Exercise Habits: The patient does not participate in regular exercise at present, Exercise limited by: orthopedic condition(s)   Goals Addressed    None    Depression Screen    07/30/2022    1:55 PM 06/03/2022    1:21 PM 03/24/2022    1:12 PM 12/17/2021   10:32 AM 11/05/2021   10:09 AM 07/16/2021    9:09 AM 01/31/2021   10:21 AM  PHQ 2/9 Scores  PHQ - 2 Score 0 1 0 0 0 0 0    Fall Risk    07/30/2022    1:44 PM 03/24/2022    1:12 PM 12/17/2021   10:32 AM 11/05/2021   10:08 AM 07/16/2021    9:01 AM  Fall Risk   Falls in the past year? 0 0 0 0 0  Number falls in past yr: 0 0 0 0 0  Injury with Fall? 0 0 0 0 0  Risk for fall due to : No Fall Risks  No Fall Risks  No Fall Risks  Follow up Falls evaluation completed Falls evaluation completed Falls evaluation completed  Falls evaluation completed    FALL RISK PREVENTION PERTAINING TO THE HOME:  Any stairs in or around the home? No  Home free of loose throw rugs in walkways, pet beds, electrical cords, etc? Yes  Adequate lighting in your home to reduce risk of falls? Yes   ASSISTIVE DEVICES UTILIZED TO PREVENT FALLS:  Life alert? No  Use of a cane, walker or w/c? No  Grab bars in the bathroom? No  Shower chair or bench in shower? Yes  Elevated toilet seat or a handicapped toilet? Yes   TIMED UP AND GO:  Was the test performed?  No, audio visit .   Cognitive Function:        07/30/2022    1:56 PM 07/16/2021    9:27 AM  6CIT Screen  What Year? 0 points 0 points  What month? 0 points 0 points  What time? 0 points 0 points  Count back from 20 0 points 0 points  Months in reverse 0 points 0 points  Repeat phrase 0 points 0 points  Total Score 0 points 0 points    Immunizations Immunization History  Administered Date(s) Administered   Fluad Quad(high Dose 65+) 12/17/2021   Influenza Split 12/26/2010, 11/21/2011   Influenza, Quadrivalent, Recombinant, Inj, Pf 11/10/2018   Influenza,inj,Quad PF,6+ Mos 03/12/2015, 11/10/2015, 11/26/2016, 12/02/2017   Influenza-Unspecified 11/21/2011, 03/12/2015, 12/02/2017   PFIZER(Purple Top)SARS-COV-2 Vaccination 03/17/2019,  04/07/2019   PPD Test 04/28/2017   Pneumococcal Polysaccharide-23 06/14/2012   Td 03/24/2022   Tdap 09/24/2010   Zoster Recombinat (Shingrix) 08/07/2015, 11/17/2018   Zoster, Live 08/12/2015    TDAP status: Up to date  Flu Vaccine status: Up to date  Pneumococcal vaccine status: Due, Education has been provided regarding the importance of this vaccine. Advised may receive this  vaccine at local pharmacy or Health Dept. Aware to provide a copy of the vaccination record if obtained from local pharmacy or Health Dept. Verbalized acceptance and understanding.  Covid-19 vaccine status: Information provided on how to obtain vaccines.   Qualifies for Shingles Vaccine? Yes   Zostavax completed Yes   Shingrix Completed?: Yes  Screening Tests Health Maintenance  Topic Date Due   OPHTHALMOLOGY EXAM  Never done   Pneumonia Vaccine 36+ Years old (2 of 2 - PCV) 01/14/2019   COVID-19 Vaccine (3 - 2023-24 season) 10/25/2021   FOOT EXAM  11/21/2021   HEMOGLOBIN A1C  06/21/2022   MAMMOGRAM  08/24/2022   INFLUENZA VACCINE  09/25/2022   Diabetic kidney evaluation - eGFR measurement  03/25/2023   Diabetic kidney evaluation - Urine ACR  03/25/2023   Medicare Annual Wellness (AWV)  07/30/2023   Colonoscopy  02/23/2031   DTaP/Tdap/Td (3 - Td or Tdap) 03/24/2032   DEXA SCAN  Completed   Hepatitis C Screening  Completed   Zoster Vaccines- Shingrix  Completed   HPV VACCINES  Aged Out    Health Maintenance  Health Maintenance Due  Topic Date Due   OPHTHALMOLOGY EXAM  Never done   Pneumonia Vaccine 85+ Years old (2 of 2 - PCV) 01/14/2019   COVID-19 Vaccine (3 - 2023-24 season) 10/25/2021   FOOT EXAM  11/21/2021   HEMOGLOBIN A1C  06/21/2022    Colorectal cancer screening: Type of screening: Colonoscopy. Completed 02/22/21. Repeat every 10 years  Mammogram status: Completed 08/23/20. Repeat every year  Bone Density status: Completed 07/18/21. Results reflect: Bone density results: NORMAL.  Repeat every 2 years.  Lung Cancer Screening: (Low Dose CT Chest recommended if Age 75-80 years, 30 pack-year currently smoking OR have quit w/in 15years.) does not qualify.    Additional Screening:  Hepatitis C Screening: does qualify; Completed 01/31/21  Vision Screening: Recommended annual ophthalmology exams for early detection of glaucoma and other disorders of the eye. Is the patient up to date with their annual eye exam?  Yes  Who is the provider or what is the name of the office in which the patient attends annual eye exams? [facility] If pt is not established with a provider, would they like to be referred to a provider to establish care? No .   Dental Screening: Recommended annual dental exams for proper oral hygiene  Community Resource Referral / Chronic Care Management: CRR required this visit?  No   CCM required this visit?  No      Plan:     I have personally reviewed and noted the following in the patient's chart:   Medical and social history Use of alcohol, tobacco or illicit drugs  Current medications and supplements including opioid prescriptions. Patient is not currently taking opioid prescriptions. Functional ability and status Nutritional status Physical activity Advanced directives List of other physicians Hospitalizations, surgeries, and ER visits in previous 12 months Vitals Screenings to include cognitive, depression, and falls Referrals and appointments  In addition, I have reviewed and discussed with patient certain preventive protocols, quality metrics, and best practice recommendations. A written personalized care plan for preventive services as well as general preventive health recommendations were provided to patient.   Due to this being a telephonic visit, the after visit summary with patients personalized plan was offered to patient via mail or my-chart. Patient would like to access on my-chart.  Donne Anon, [place]   07/30/2022   Nurse  Notes: None

## 2022-08-01 ENCOUNTER — Ambulatory Visit (INDEPENDENT_AMBULATORY_CARE_PROVIDER_SITE_OTHER): Payer: 59 | Admitting: *Deleted

## 2022-08-01 DIAGNOSIS — J309 Allergic rhinitis, unspecified: Secondary | ICD-10-CM

## 2022-08-19 ENCOUNTER — Ambulatory Visit: Payer: Self-pay | Admitting: Licensed Clinical Social Worker

## 2022-08-19 ENCOUNTER — Ambulatory Visit (INDEPENDENT_AMBULATORY_CARE_PROVIDER_SITE_OTHER): Payer: 59 | Admitting: *Deleted

## 2022-08-19 DIAGNOSIS — J309 Allergic rhinitis, unspecified: Secondary | ICD-10-CM

## 2022-08-19 NOTE — Patient Instructions (Signed)
Social Work Visit Information  Thank you for taking time to visit with me today. Please don't hesitate to contact me if I can be of assistance to you.   Following are the goals we discussed today:   Goals Addressed             This Visit's Progress    Manage mental health       Activities and task to complete in order to accomplish goals.   Call [facility] [phone] to schedule a therapy appointment Congratulations on finding a place of your own ( we will spend more time processing your stressors during our next call)   Keep all upcoming appointment discussed today Continue with compliance of taking medication prescribed by Doctor Self Support options  (continue working on life balance, pouring back into yourself and continue with sleep hygiene)         Our next appointment is by telephone on 08/25/22 at 2:30    Please call the care guide team at [phone] if you need to cancel or reschedule your appointment.   If you or anyone you know are experiencing a Mental Health or Behavioral Health Crisis or need someone to talk to, please call the Suicide and Crisis Lifeline: 988 call the [place] National Suicide Prevention Lifeline: (414) 866-8260 or TTY: (819)886-4599 TTY 781 496 1432) to talk to a trained counselor call 1-800-273-TALK (toll free, 24 hour hotline) go to [facility] [address], [place] [phone])   Patient verbalizes understanding of instructions and care plan provided today and agrees to view in MyChart. Active MyChart status and patient understanding of how to access instructions and care plan via MyChart confirmed with patient.       Sammuel Hines, LCSW Social Work Care Coordination  [facility] [employer] [phone]

## 2022-08-19 NOTE — Patient Outreach (Signed)
  Care Coordination  Follow Up Visit Note   08/19/2022 Name: Katrina Vega MRN: [id number] DOB: [birth year]  Katrina Vega is a 67 y.o. year old female who sees Zola Button, Grayling Congress, DO for primary care. I spoke with  Katrina Vega by phone today.  What matters to the patients health and wellness today?  Managing stressors in her life and moving into her new home. Patient has not been able to [facility] due to multi things going on in her life.  Goals Addressed             This Visit's Progress    Manage mental health       Activities and task to complete in order to accomplish goals.   Call [facility] [phone] to schedule a therapy appointment Congratulations on finding a place of your own ( we will spend more time processing your stressors during our next call)   Keep all upcoming appointment discussed today Continue with compliance of taking medication prescribed by Doctor Self Support options  (continue working on life balance, pouring back into yourself and continue with sleep hygiene)        SDOH assessments and interventions completed:  No   Care Coordination Interventions:  Yes, provided  Interventions Today    Flowsheet Row Most Recent Value  Chronic Disease   Chronic disease during today's visit Congestive Heart Failure (CHF), Chronic Obstructive Pulmonary Disease (COPD), Chronic Kidney Disease/End Stage Renal Disease (ESRD), Diabetes  Education Interventions   Education Provided Provided Education  Provided Verbal Education On Mental Health/Coping with Illness  Mental Health Interventions   Mental Health Discussed/Reviewed Mental Health Reviewed, Anxiety, Depression  [active listening, emotional support,]       Follow up plan: Follow up call scheduled for 08/25/22    Encounter Outcome:  Pt. Visit Completed   Sammuel Hines, LCSW Social Work Care Coordination  [facility] [employer] [phone]

## 2022-08-20 ENCOUNTER — Encounter: Payer: Self-pay | Admitting: Allergy

## 2022-08-20 ENCOUNTER — Ambulatory Visit (INDEPENDENT_AMBULATORY_CARE_PROVIDER_SITE_OTHER): Payer: 59 | Admitting: Allergy

## 2022-08-20 VITALS — BP 128/76 | HR 89 | Temp 98.1°F | Resp 18 | Ht 65.0 in | Wt 240.1 lb

## 2022-08-20 DIAGNOSIS — Z713 Dietary counseling and surveillance: Secondary | ICD-10-CM

## 2022-08-20 DIAGNOSIS — J4489 Other specified chronic obstructive pulmonary disease: Secondary | ICD-10-CM | POA: Diagnosis not present

## 2022-08-20 DIAGNOSIS — J302 Other seasonal allergic rhinitis: Secondary | ICD-10-CM | POA: Diagnosis not present

## 2022-08-20 DIAGNOSIS — J4550 Severe persistent asthma, uncomplicated: Secondary | ICD-10-CM | POA: Diagnosis not present

## 2022-08-20 DIAGNOSIS — H1013 Acute atopic conjunctivitis, bilateral: Secondary | ICD-10-CM

## 2022-08-20 DIAGNOSIS — H1010 Acute atopic conjunctivitis, unspecified eye: Secondary | ICD-10-CM

## 2022-08-20 DIAGNOSIS — K219 Gastro-esophageal reflux disease without esophagitis: Secondary | ICD-10-CM | POA: Diagnosis not present

## 2022-08-20 MED ORDER — ALBUTEROL SULFATE (2.5 MG/3ML) 0.083% IN NEBU: 2.5000 mg | INHALATION_SOLUTION | RESPIRATORY_TRACT | 1 refills | Status: DC | PRN

## 2022-08-20 MED ORDER — BUDESONIDE 0.5 MG/2ML IN SUSP: 0.5000 mg | Freq: Two times a day (BID) | RESPIRATORY_TRACT | 2 refills | Status: DC

## 2022-08-20 NOTE — Assessment & Plan Note (Signed)
Symptoms flared since traveling to [place] and on a cruise.  Make a follow up with your cardiologist - and discuss whether lisinopril can be stopped or switched so we can monitor if the coughing improves.  Today's spirometry was unremarkable - improved from previous one.  If breathing is not better by Friday - then will send in prednisone.  Daily controller medication(s): Breztri 2 puffs twice a day with spacer and rinse mouth afterwards. Fasenra injections every 8 weeks. Continue Singulair (montelukast) 10mg  daily at night. During respiratory infections/flares:  Start Pulmicort (budesonide) 0.5mg  nebulizer twice a day for 1-2 weeks until your breathing symptoms return to baseline.  Pretreat with albuterol 2 puffs or albuterol nebulizer.  If you need to use your albuterol nebulizer machine back to back within 15-30 minutes with no relief then please go to the ER/urgent care for further evaluation.  May use albuterol rescue inhaler 2 puffs or nebulizer every 4 to 6 hours as needed for shortness of breath, chest tightness, coughing, and wheezing. May use albuterol rescue inhaler 2 puffs 5 to 15 minutes prior to strenuous physical activities. Monitor frequency of use - if you need to use it more than twice per week on a consistent basis let us know.  Get spirometry at next visit.

## 2022-08-20 NOTE — Patient Instructions (Signed)
Make a follow up with your cardiologist - and discuss whether lisinopril can be stopped or switched so we can monitor if the coughing improves.   Asthma If your breathing is not better by Friday - gives [place] a call and I'll send in some prednisone.  Daily controller medication(s): Breztri 2 puffs twice a day with spacer and rinse mouth afterwards. Fasenra injections every 8 weeks. Continue Singulair (montelukast) 10mg  daily at night. During respiratory infections/flares:  Start Pulmicort (budesonide) 0.5mg  nebulizer twice a day for 1-2 weeks until your breathing symptoms return to baseline.  Pretreat with albuterol 2 puffs or albuterol nebulizer.  If you need to use your albuterol nebulizer machine back to back within 15-30 minutes with no relief then please go to the ER/urgent care for further evaluation.  May use albuterol rescue inhaler 2 puffs or nebulizer every 4 to 6 hours as needed for shortness of breath, chest tightness, coughing, and wheezing. May use albuterol rescue inhaler 2 puffs 5 to 15 minutes prior to strenuous physical activities. Monitor frequency of use - if you need to use it more than twice per week on a consistent basis let us know.  Breathing control goals:  Full participation in all desired activities (may need albuterol before activity) Albuterol use two times or less a week on average (not counting use with activity) Cough interfering with sleep two times or less a month Oral steroids no more than once a year No hospitalizations   Environmental allergies Continue environmental control measures. Continue allergy injections. Skip on the days when your asthma is acting up. Use over the counter antihistamines such as Zyrtec (cetirizine), Claritin (loratadine), Allegra (fexofenadine), or Xyzal (levocetirizine) daily as needed. May take twice a day during allergy flares. May switch antihistamines every few months. Use Nasacort (triamcinolone) nasal spray 1 spray per  nostril twice a day as needed for nasal congestion.  Use azelastine nasal spray 1-2 sprays per nostril twice a day as needed for runny nose/drainage. Nasal saline spray (i.e., Simply Saline) or nasal saline lavage (i.e., NeilMed) is recommended as needed and prior to medicated nasal sprays. Use cromolyn 4% 1 drop in each eye up to four times a day as needed for itchy/watery eyes.    Reflux:  Continue lifestyle and dietary modifications. Continue Protonix 40mg  twice day - nothing to eat or drink for 20-30 minutes afterwards.   Follow up in 3 months or sooner if needed.

## 2022-08-20 NOTE — Progress Notes (Signed)
Follow Up Note  RE: Katrina Vega MRN: [id number] DOB: [birth year] Date of Office Visit: 08/20/2022  Referring provider: Zola Button, Grayling Congress, * Primary care provider: Zola Button, Grayling Congress, DO  Chief Complaint: Asthma (Has been having some shortness of breath. Flem in her chest and nothing is coming up with chest pain. )  History of Present Illness: I had the pleasure of seeing Katrina Vega for a follow up visit at the [facility] on 08/20/2022. She is a 67 y.o. female, who is being followed for asthma on Fasenra, allergic rhinoconjunctivitis on AIT, reflux, adverse food reaction. Her previous allergy office visit was on 05/05/2022 with Katrina Settle FNP. Today is a regular follow up visit.  Asthma Cold air seems to make the coughing worse. Now having some drainage and chest congestion. This has been going on for about 2-3 weeks while she was traveling in [place] and on a cruise.  Denies fevers/chills or sick contacts.   Currently taking Breztri 2 puffs BID. Lately has been needing to use albuterol 3 times per day since [place].  Apparently both houses she visited has dogs.   No recent prednisone or antibiotics use.   Still taking Fasenra every 8 weeks with unknown benefit and Singulair daily.  Patient has been on lisinopril for over 2 years and not sure if coughing is worse since on it.   Allergic rhinitis Currently on Nasacort, azelastine nasal spray, zyrtec.  Restarted allergy injections with no issues.  Using cromolyn eye drops as needed with good benefit.    Reflux Taking pantoprazole twice a day with good control.   Adverse food reaction Patient had chicken, onion, cranberry, peanuts, tree nuts since then with no issues.   06/19/2022 pulm visit: "This is a 67 year old female, very little smoking history but does have count of mixed PFT pattern.  She does have impaired spirometry and a low DLCO.  No significant evidence of interstitial lung disease on  CT imaging but she does have areas of air trapping and small airway disease which is likely related to her obesity and history of asthma.   Plan: Continue Fasenra per allergist Continue triple therapy inhaler regimen Continue albuterol as needed. Continue weight loss management. Patient to follow-up with [place] in 1 year or as needed."  Assessment and Plan: Katrina Vega is a 67 y.o. female with: Asthma-COPD overlap syndrome Symptoms flared since traveling to [place] and on a cruise.  Make a follow up with your cardiologist - and discuss whether lisinopril can be stopped or switched so we can monitor if the coughing improves.  Today's spirometry was unremarkable - improved from previous one.  If breathing is not better by Friday - then will send in prednisone.  Daily controller medication(s): Breztri 2 puffs twice a day with spacer and rinse mouth afterwards. Fasenra injections every 8 weeks. Continue Singulair (montelukast) 10mg  daily at night. During respiratory infections/flares:  Start Pulmicort (budesonide) 0.5mg  nebulizer twice a day for 1-2 weeks until your breathing symptoms return to baseline.  Pretreat with albuterol 2 puffs or albuterol nebulizer.  If you need to use your albuterol nebulizer machine back to back within 15-30 minutes with no relief then please go to the ER/urgent care for further evaluation.  May use albuterol rescue inhaler 2 puffs or nebulizer every 4 to 6 hours as needed for shortness of breath, chest tightness, coughing, and wheezing. May use albuterol rescue inhaler 2 puffs 5 to 15 minutes prior to strenuous physical activities.  Monitor frequency of use - if you need to use it more than twice per week on a consistent basis let us know.  Get spirometry at next visit.  Seasonal and perennial allergic rhinoconjunctivitis Past history - started on allergy injections in [place] in 2021 [facility]). Interim history - restarted AIT.  Continue environmental control  measures. Continue allergy injections. Skip on the days when your asthma is acting up. Use over the counter antihistamines such as Zyrtec (cetirizine), Claritin (loratadine), Allegra (fexofenadine), or Xyzal (levocetirizine) daily as needed. May take twice a day during allergy flares. May switch antihistamines every few months. Use Nasacort (triamcinolone) nasal spray 1 spray per nostril twice a day as needed for nasal congestion.  Use azelastine nasal spray 1-2 sprays per nostril twice a day as needed for runny nose/drainage. Nasal saline spray (i.e., Simply Saline) or nasal saline lavage (i.e., NeilMed) is recommended as needed and prior to medicated nasal sprays. Use cromolyn 4% 1 drop in each eye up to four times a day as needed for itchy/watery eyes.    Gastroesophageal reflux disease Continue lifestyle and dietary modifications. Continue Protonix 40mg  twice day - nothing to eat or drink for 20-30 minutes afterwards.  Dietary counseling and surveillance Concern regarding food allergies. Bloodwork was all negative. Patient tolerating chicken, onion, cranberry, peanuts and tree nuts with no issues. No dietary restrictions.   Return in about 3 months (around 11/20/2022).  Meds ordered this encounter  Medications   albuterol (PROVENTIL) (2.5 MG/3ML) 0.083% nebulizer solution    Sig: Take 3 mLs (2.5 mg total) by nebulization every 4 (four) hours as needed for wheezing or shortness of breath (coughing fits).    Dispense:  75 mL    Refill:  1   budesonide (PULMICORT) 0.5 MG/2ML nebulizer solution    Sig: Take 2 mLs (0.5 mg total) by nebulization in the morning and at bedtime. During respiratory infections for 1-2 weeks at a time.    Dispense:  120 mL    Refill:  2   Lab Orders  No laboratory test(s) ordered today    Diagnostics: Spirometry:  Tracings reviewed. Her effort: It was hard to get consistent efforts and there is a question as to whether this reflects a maximal  maneuver. FVC: 2.21L FEV1: 1.89L, 92% predicted FEV1/FVC ratio: 86% Interpretation: No overt abnormalities noted given today's efforts.  Please see scanned spirometry results for details.  Medication List:  Current Outpatient Medications  Medication Sig Dispense Refill   albuterol (PROVENTIL) (2.5 MG/3ML) 0.083% nebulizer solution Take 3 mLs (2.5 mg total) by nebulization every 4 (four) hours as needed for wheezing or shortness of breath (coughing fits). 75 mL 1   albuterol (VENTOLIN HFA) 108 (90 Base) MCG/ACT inhaler Inhale 2 puffs into the lungs every 6 (six) hours as needed for wheezing. 18 g 1   BD AUTOSHIELD DUO 30G X 5 MM MISC 1 KIT BY MISC ROUTE 3 TIMES DAILY BEFORE MEALS     Budeson-Glycopyrrol-Formoterol (BREZTRI AEROSPHERE) 160-9-4.8 MCG/ACT AERO INHALE 2 INHALATIONS BY MOUTH  TWICE DAILY TO PREVENT COUGH OR  WHEEZE. RINSE, GARGLE, AND SPIT  AFTER USE 32.1 g 1   budesonide (PULMICORT) 0.5 MG/2ML nebulizer solution Take 2 mLs (0.5 mg total) by nebulization in the morning and at bedtime. During respiratory infections for 1-2 weeks at a time. 120 mL 2   bumetanide (BUMEX) 1 MG tablet TAKE 1 TABLET(1 MG) BY MOUTH DAILY 30 tablet 0   cetirizine (ZYRTEC) 10 MG tablet Take 1  tablet by mouth daily.     Cholecalciferol 50 MCG (2000 UT) TABS Take 2,000 Units by mouth daily.     clopidogrel (PLAVIX) 75 MG tablet TAKE 1 TABLET BY MOUTH DAILY 100 tablet 2   Continuous Blood Gluc Sensor (DEXCOM G6 SENSOR) MISC USE 1 SENSOR AS NEEDED CHANGE EVERY 10 DAYS     Continuous Blood Gluc Transmit (DEXCOM G6 TRANSMITTER) MISC      cromolyn (OPTICROM) 4 % ophthalmic solution Place 1 drop into both eyes 4 (four) times daily. INSTILL 1 DROP INTO BOTH EYES UP TO 4 TIMES DAILY AS NEEDED FOR  RED, ITCHY, OR WATERY EYES 60 mL 1   empagliflozin (JARDIANCE) 10 MG TABS tablet Take 1 tablet (10 mg total) by mouth daily before breakfast. 30 tablet 3   EPINEPHrine (EPIPEN 2-PAK) 0.3 mg/0.3 mL IJ SOAJ injection Use as  directed for severe allergic reactions 2 each 3   FASENRA PEN 30 MG/ML SOAJ Inject into the skin.     gabapentin (NEURONTIN) 300 MG capsule Take 1 capsule (300 mg total) by mouth 3 (three) times daily. 270 capsule 1   glucagon 1 MG injection Inject 1 mg into the muscle once as needed (low blood sugar).     glucose 4 GM chewable tablet Chew 1 tablet by mouth once as needed for low blood sugar.     HUMALOG 100 UNIT/ML injection Inject into the skin.     HUMULIN N 100 UNIT/ML injection Inject 60 Units into the skin 3 (three) times daily before meals.     Insulin Disposable Pump (OMNIPOD 5 G6 INTRO, GEN 5,) KIT CHANGE POD EVERY 2 DAYS     isosorbide mononitrate (IMDUR) 60 MG 24 hr tablet TAKE 1 TABLET BY MOUTH DAILY 100 tablet 2   levalbuterol (XOPENEX HFA) 45 MCG/ACT inhaler USE 2 INHALATIONS BY MOUTH EVERY 4 TO 6 HOURS AS NEEDED FOR  SHORTNESS OF BREATH , COUGH,  WHEEZE AND TIGHTNESS IN CHEST 45 g 6   levocetirizine (XYZAL) 5 MG tablet TAKE 1 TABLET BY MOUTH EVERY  EVENING IF NEEDED 100 tablet 3   lisinopril (ZESTRIL) 20 MG tablet TAKE 1 TABLET BY MOUTH DAILY 100 tablet 2   methocarbamol (ROBAXIN) 500 MG tablet Take 1 tablet (500 mg total) by mouth every 8 (eight) hours as needed for muscle spasms. 20 tablet 0   metoprolol tartrate (LOPRESSOR) 50 MG tablet TAKE 1 TABLET BY MOUTH EVERY 12  HOURS 60 tablet 11   montelukast (SINGULAIR) 10 MG tablet TAKE 1 TABLET BY MOUTH AT  BEDTIME 100 tablet 1   nitroGLYCERIN (NITROSTAT) 0.4 MG SL tablet Place 1 tablet (0.4 mg total) under the tongue every 5 (five) minutes as needed for chest pain. 25 tablet 3   OZEMPIC, 2 MG/DOSE, 8 MG/3ML SOPN Inject 2 mg into the skin once a week.     pantoprazole (PROTONIX) 40 MG tablet Take 1 tablet (40 mg total) by mouth 2 (two) times daily. 200 tablet 3   potassium chloride (KLOR-CON) 10 MEQ tablet Take 1 tablet (10 mEq total) by mouth daily. 90 tablet 3   Respiratory Therapy Supplies (CARETOUCH 2 CPAP HOSE HANGER) MISC by  Does not apply route.     rosuvastatin (CRESTOR) 40 MG tablet Take 1 tablet (40 mg total) by mouth at bedtime. 90 tablet 3   silver sulfADIAZINE (SILVADENE) 1 % cream Apply 1 Application topically daily. 50 g 0   Current Facility-Administered Medications  Medication Dose Route Frequency Provider Last Rate Last  Admin   Benralizumab SOSY 30 mg  30 mg Subcutaneous Q8 Lynnae January, FNP   30 mg at 07/14/22 1408   Allergies: Allergies  Allergen Reactions   Contrast Media [Iodinated Contrast Media] Shortness Of Breath and Other (See Comments)    sob, chest tight   Morphine Itching   Statins Diarrhea and Other (See Comments)    nausea, diarrhea, myalgias   Ioversol    I reviewed her past medical history, social history, family history, and environmental history and no significant changes have been reported from her previous visit.  Review of Systems  Constitutional:  Negative for appetite change, chills, fever and unexpected weight change.  HENT:  Positive for postnasal drip. Negative for congestion and rhinorrhea.   Eyes:  Negative for itching.  Respiratory:  Positive for cough and chest tightness. Negative for shortness of breath and wheezing.   Cardiovascular:  Negative for chest pain.  Gastrointestinal:  Negative for abdominal pain.  Genitourinary:  Negative for difficulty urinating.  Skin:  Negative for rash.  Allergic/Immunologic: Positive for environmental allergies.  Neurological:  Negative for headaches.    Objective: BP 128/76   Pulse 89   Temp 98.1 F (36.7 C)   Resp 18   Ht 5\' 5"  (1.651 m)   Wt 240 lb 1.6 oz (108.9 kg)   SpO2 98%   BMI 39.95 kg/m  Body mass index is 39.95 kg/m. Physical Exam Vitals and nursing note reviewed.  Constitutional:      Appearance: Normal appearance. She is well-developed.  HENT:     Head: Normocephalic and atraumatic.     Right Ear: Tympanic membrane and external ear normal.     Left Ear: Tympanic membrane and external ear  normal.     Nose: Nose normal.     Mouth/Throat:     Mouth: Mucous membranes are moist.     Pharynx: Oropharynx is clear.  Eyes:     Conjunctiva/sclera: Conjunctivae normal.  Cardiovascular:     Rate and Rhythm: Normal rate and regular rhythm.     Heart sounds: Normal heart sounds. No murmur heard.    No friction rub. No gallop.  Pulmonary:     Effort: Pulmonary effort is normal.     Breath sounds: Normal breath sounds. No wheezing, rhonchi or rales.  Musculoskeletal:     Cervical back: Neck supple.  Skin:    General: Skin is warm.     Findings: No rash.  Neurological:     Mental Status: She is alert and oriented to person, place, and time.  Psychiatric:        Behavior: Behavior normal.    Previous notes and tests were reviewed. The plan was reviewed with the patient/family, and all questions/concerned were addressed.  It was my pleasure to see Katrina Vega today and participate in her care. Please feel free to contact me with any questions or concerns.  Sincerely,  Wyline Mood, DO Allergy & Immunology  [facility] office: [phone] [facility] office: [phone]

## 2022-08-20 NOTE — Assessment & Plan Note (Signed)
Past history - started [place] allergy injections in [place] in 2021 [facility] & [facility]). Interim history - restarted AIT.  Continue environmental control measures. Continue allergy injections. Skip on the days when your asthma is acting up. Use over the counter antihistamines such as Zyrtec (cetirizine), Claritin (loratadine), Allegra (fexofenadine), or Xyzal (levocetirizine) daily as needed. May take twice a day during allergy flares. May switch antihistamines every few months. Use Nasacort (triamcinolone) nasal spray 1 spray per nostril twice a day as needed for nasal congestion.  Use azelastine nasal spray 1-2 sprays per nostril twice a day as needed for runny nose/drainage. Nasal saline spray (i.e., Simply Saline) or nasal saline lavage (i.e., NeilMed) is recommended as needed and prior to medicated nasal sprays. Use cromolyn 4% 1 drop in each eye up to four times a day as needed for itchy/watery eyes.

## 2022-08-20 NOTE — Assessment & Plan Note (Signed)
Continue lifestyle and dietary modifications. Continue Protonix 40mg  twice day - nothing to eat or drink for 20-30 minutes afterwards.

## 2022-08-20 NOTE — Assessment & Plan Note (Signed)
Concern regarding food allergies. Bloodwork was all negative. Patient tolerating chicken, onion, cranberry, peanuts and tree nuts with no issues. No dietary restrictions.

## 2022-08-25 ENCOUNTER — Ambulatory Visit: Payer: 59 | Admitting: Allergy

## 2022-08-25 ENCOUNTER — Ambulatory Visit: Payer: Self-pay | Admitting: Licensed Clinical Social Worker

## 2022-08-25 NOTE — Patient Outreach (Signed)
  Care Coordination  Follow Up Visit Note   08/25/2022 Name: Katrina Vega MRN: [id number] DOB: 03-18-1955  Katrina Vega is a 67 y.o. year old female who sees Katrina Vega, Katrina Congress, DO for primary care. I spoke with  Katrina Vega by phone today.  What matters to the patients health and wellness today?  Reducing stress  Patient is making progress with goals below and personal goals.  She has moved into her new home and is getting settled.     Goals Addressed             This Visit's Progress    Manage mental health and stressors       Activities and task to complete in order to accomplish goals.   Call [facility] [phone] to schedule a therapy appointment Congratulations on finding a place of your own Keep all upcoming appointment discussed today Continue with compliance of taking medication prescribed by Doctor Self Support options  (continue working on life balance, and continue with sleep hygiene) We will continue to discuss options to expand your community support during our next call        SDOH assessments and interventions completed:  Yes  SDOH Interventions Today    Flowsheet Row Most Recent Value  SDOH Interventions   Transportation Interventions Intervention Not Indicated       Care Coordination Interventions:  Yes, provided  Interventions Today    Flowsheet Row Most Recent Value  Chronic Disease   Chronic disease during today's visit Congestive Heart Failure (CHF), Chronic Obstructive Pulmonary Disease (COPD), Chronic Kidney Disease/End Stage Renal Disease (ESRD), Diabetes  General Interventions   General Interventions Discussed/Reviewed General Interventions Reviewed  Education Interventions   Education Provided Provided Education  Provided Verbal Education On Mental Health/Coping with Illness  Mental Health Interventions   Mental Health Discussed/Reviewed Mental Health Reviewed, Coping Strategies  [acitive listen, solution focused,  emotional support,]       Follow up plan: Follow up call scheduled for 3 weeks 09/16/22    Encounter Outcome:  Pt. Visit Completed   Katrina Hines, LCSW Social Work Care Coordination  [facility] Katrina Vega [employer] [phone]

## 2022-08-25 NOTE — Patient Instructions (Signed)
Social Work Visit Information  Thank you for taking time to visit with me today. Please don't hesitate to contact me if I can be of assistance to you.   Following are the goals we discussed today:   Goals Addressed             This Visit's Progress    Manage mental health and stressors       Activities and task to complete in order to accomplish goals.   Call [facility] [phone] to schedule a therapy appointment Congratulations on finding a place of your own Keep all upcoming appointment discussed today Continue with compliance of taking medication prescribed by Doctor Self Support options  (continue working on life balance, and continue with sleep hygiene) We will continue to discuss options to expand your community support during our next call        Our next appointment is by telephone on 09/16/22 at 3:00   Please call the care guide team at [phone] if you need to cancel or reschedule your appointment.   If you or anyone you know are experiencing a Mental Health or Behavioral Health Crisis or need someone to talk to, please call the Suicide and Crisis Lifeline: 988 call the [place] National Suicide Prevention Lifeline: 530-308-3491 or TTY: (416) 742-1652 TTY (803)528-9923) to talk to a trained counselor call 1-800-273-TALK (toll free, 24 hour hotline) go to [facility] [address] [place] [phone])   Patient verbalizes understanding of instructions and care plan provided today and agrees to view in MyChart. Active MyChart status and patient understanding of how to access instructions and care plan via MyChart confirmed with patient.       Sammuel Hines, LCSW Social Work Care Coordination  [facility] Emmie Niemann [employer] [phone]

## 2022-08-26 ENCOUNTER — Telehealth (INDEPENDENT_AMBULATORY_CARE_PROVIDER_SITE_OTHER): Payer: 59 | Admitting: Family Medicine

## 2022-08-26 ENCOUNTER — Encounter: Payer: Self-pay | Admitting: Family Medicine

## 2022-08-26 ENCOUNTER — Telehealth: Payer: Self-pay

## 2022-08-26 DIAGNOSIS — G5603 Carpal tunnel syndrome, bilateral upper limbs: Secondary | ICD-10-CM | POA: Diagnosis not present

## 2022-08-26 NOTE — Progress Notes (Signed)
MyChart Video Visit    Virtual Visit via Video Note   This patient is at least at moderate risk for complications without adequate follow up. This format is felt to be most appropriate for this patient at this time. Physical exam was limited by quality of the video and audio technology used for the visit. Herbert Seta was able to get the patient set up on a video visit.  Patient location: home,  Patient and provider in visit Provider location: Office  I discussed the limitations of evaluation and management by telemedicine and the availability of in person appointments. The patient expressed understanding and agreed to proceed.  Visit Date: 08/26/2022  Today's healthcare provider: Donato Schultz, DO     Subjective:    Patient ID: Katrina Vega, female    DOB: 02/24/1956, 67 y.o.   MRN: [id number]  Chief Complaint  Patient presents with   Hand Pain    HPI Discussed the use of AI scribe software for clinical note transcription with the patient, who gave verbal consent to proceed.  History of Present Illness   The patient, with a history of bilateral carpal tunnel syndrome status post surgical release, presents with left hand pain and swelling. The discomfort is located in the thumb and first two fingers and radiates up the forearm to the shoulder. The pain is exacerbated by finger movement and is associated with numbness and tingling. The patient denies neck pain. The symptoms began after a recent move, during which the patient was lifting heavy objects. The patient also reports right hand discomfort, but it is less severe than the left. 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 The patient is unable to find her left hand splint.       Past Medical History:  Diagnosis Date   Asthma    CAD S/P two-vessel DES PCI 2008   LAD and RI; 2013 PTCA of small RCA.   CHF (congestive heart  failure), NYHA class I, chronic, diastolic (HCC) 2019   Recent Echo 12/16/2021: Mild concentric LVH.  EF 59%.  GI 1 DD.  Normal LAP-(Mildly dilated LA;; has converted from to torsemide and now on bumetanide   CKD stage 3 due to type 2 diabetes mellitus (HCC)    COPD (chronic obstructive pulmonary disease) (HCC) 2021   Complicated by asthma and seasonal allergies.   Diabetes mellitus, type II, insulin dependent (HCC) 2010   On insulin 60 units TID Premeal.  Also on Jardiance and Ozempic.   Eczema    Essential hypertension    Heart attack [facility]) 2008   2008-two-vessel PCI; 2013 PTCA only small RCA   History of left hip replacement 04/2018   Hyperlipidemia associated with type 2 diabetes mellitus (HCC)    140 mg rosuvastatin   Insulin pump in place    PAD (peripheral artery disease) (HCC)    Status post bilateral SFA stents and right posterior tibial DES stent   Primary hypertension 01/20/2011      Patient's blood pressure is well controlled.  Continue current medications.    Past Surgical History:  Procedure Laterality Date   AAA DUPLEX  10/09/2020   Max Aorta (sac) diameter 2.51 cm prox with mild ectasia in Prox Aorta. Diffuse plaque in mid-distal Aorta. Bilateral ICA poorly visulailzed - appear to be Severely stenosed with difuse plaque. - Consider CTA of cath directed Angio.   APPENDECTOMY  1974   BIOPSY  02/22/2021   Procedure: BIOPSY;  Surgeon: Jeani Hawking, MD;  Location: [facility];  Service: Endoscopy;;   CARPAL TUNNEL RELEASE  2017   COLONOSCOPY WITH PROPOFOL N/A 02/22/2021   Procedure: COLONOSCOPY WITH PROPOFOL;  Surgeon: Jeani Hawking, MD;  Location: [facility];  Service: Endoscopy;  Laterality: N/A;   CORONARY BALLOON ANGIOPLASTY  2013   PTCA of RCA followed by PCI   CORONARY STENT INTERVENTION  2008   Text DES PCI to LAD and RI/OM1; also prox RCA   ESOPHAGOGASTRODUODENOSCOPY (EGD) WITH PROPOFOL N/A 02/22/2021   Procedure: ESOPHAGOGASTRODUODENOSCOPY (EGD) WITH  PROPOFOL;  Surgeon: Jeani Hawking, MD;  Location: [facility];  Service: Endoscopy;  Laterality: N/A;   LEA Dopplers  10/09/2020   R mid & Distal SFA -CTO - dampend flow in R Pop via collaterals - Moderate velocity increase in R PFA. No signficant stenosis in LLE.   R ABI 0.97) - normal. L ABI - 0.91 w/ monophasic flow in L AT -> c/w 11/2019- R SFA new.   LEFT HEART CATH AND CORONARY ANGIOGRAPHY  12/22/2017   Mild LM plaque.  Mild proximal LAD plaque.  High D1-40% ostial.  Patent mid LAD DES (Taxus from 2008), large distal LAD free disease; Prox LCx-OM1 stents patent (Taxus 2008). Small RCA - patent prox stent(~50-60% ISR), PTCA site from 2013 - patent   LOWER EXTREMITY ANGIOGRAM Bilateral 12/22/2017   Bilateral SFA stents with evidence of severe right and mid left ISR bilateral popliteal arteries proximal trifurcation vessels are patent.  Moderate to severe lesion involving mid R AntTib, poor distal flow in L Ant Tib - 2 V runoff Bilat. --> referred for R SFA Laser Atherectomy & DCB 5 x 120 mm.   LOWER EXTREMITY INTERVENTION Right 12/22/2017   Laser atherectomy of right SFA followed by [facility] with 5.0 x 120 mm impact.-For severe right SFA ISR   LUMBAR SPINE SURGERY  2010   NM MYOVIEW LTD  07/07/2019   [facility]): Lexiscan.  Nondiagnostic EKG.  Dyspnea with effusion.  No ischemia or infarction.  Soft tissue attenuation noted.Marland Kitchen  LVEF 72%.  No RWMA.  LOW RISK.--No change from 11/2017   POLYPECTOMY  02/22/2021   Procedure: POLYPECTOMY;  Surgeon: Jeani Hawking, MD;  Location: [facility];  Service: Endoscopy;;   REPLACEMENT TOTAL KNEE  2017 and 2018   TONSILLECTOMY     TOTAL ABDOMINAL HYSTERECTOMY  1989   TOTAL HIP ARTHROPLASTY  04/2018   TOTAL SHOULDER ARTHROPLASTY  11/2019   TRANSTHORACIC ECHOCARDIOGRAM  07/04/2019   Normal LV size, mild LVH, hyperdynamic LVEF at >65% with grade 1 diastolic dysfunction.  Otherwise no other significant abnormality.  Poor quality due to  patient body habitus.   TRANSTHORACIC ECHOCARDIOGRAM  12/16/2021   [facility]) normal LV size and function.  Moderate concentric LVH.  Normal WM.  EF estimated 59%.  GR 1 DD.  Mild LA dilation.  No valvular lesions.    Family History  Problem Relation Age of Onset   Heart disease Mother    Breast cancer Mother    Heart attack Father    Lung cancer Father    Eczema Grandson    Allergic rhinitis Neg Hx    Angioedema Neg Hx    Asthma Neg Hx    Atopy Neg Hx    Immunodeficiency Neg Hx    Urticaria Neg Hx     Social History   Socioeconomic History   Marital status: Single    Spouse name: Not on file  Number of children: 5   Years of education: Not on file   Highest education level: Not on file  Occupational History   Not on file  Tobacco Use   Smoking status: Former    Packs/day: 0.50    Years: 15.00    Additional pack years: 0.00    Total pack years: 7.50    Types: Cigarettes    Quit date: 10/14/1995    Years since quitting: 26.8   Smokeless tobacco: Never  Vaping Use   Vaping Use: Never used  Substance and Sexual Activity   Alcohol use: Not Currently   Drug use: Not Currently   Sexual activity: Yes  Other Topics Concern   Not on file  Social History Narrative   Not on file   Social Determinants of Health   Financial Resource Strain: Low Risk  (07/16/2021)   Overall Financial Resource Strain (CARDIA)    Difficulty of Paying Living Expenses: Not hard at all  Food Insecurity: No Food Insecurity (05/15/2022)   Hunger Vital Sign    Worried About Running Out of Food in the Last Year: Never true    Ran Out of Food in the Last Year: Never true  Transportation Needs: No Transportation Needs (08/25/2022)   PRAPARE - Administrator, Civil Service (Medical): No    Lack of Transportation (Non-Medical): No  Physical Activity: Inactive (07/16/2021)   Exercise Vital Sign    Days of Exercise per Week: 0 days    Minutes of Exercise per  Session: 0 min  Stress: Stress Concern Present (07/09/2022)   [employer] of Occupational Health - Occupational Stress Questionnaire    Feeling of Stress : To some extent  Social Connections: Moderately Isolated (07/16/2021)   Social Connection and Isolation Panel [NHANES]    Frequency of Communication with Friends and Family: More than three times a week    Frequency of Social Gatherings with Friends and Family: Never    Attends Religious Services: More than 4 times per year    Active Member of [employer] or Organizations: No    Attends Banker Meetings: Never    Marital Status: Never married  Intimate Partner Violence: Not At Risk (07/16/2021)   Humiliation, Afraid, Rape, and Kick questionnaire    Fear of Current or Ex-Partner: No    Emotionally Abused: No    Physically Abused: No    Sexually Abused: No    Outpatient Medications Prior to Visit  Medication Sig Dispense Refill   albuterol (PROVENTIL) (2.5 MG/3ML) 0.083% nebulizer solution Take 3 mLs (2.5 mg total) by nebulization every 4 (four) hours as needed for wheezing or shortness of breath (coughing fits). 75 mL 1   albuterol (VENTOLIN HFA) 108 (90 Base) MCG/ACT inhaler Inhale 2 puffs into the lungs every 6 (six) hours as needed for wheezing. 18 g 1   BD AUTOSHIELD DUO 30G X 5 MM MISC 1 KIT BY MISC ROUTE 3 TIMES DAILY BEFORE MEALS     Budeson-Glycopyrrol-Formoterol (BREZTRI AEROSPHERE) 160-9-4.8 MCG/ACT AERO INHALE 2 INHALATIONS BY MOUTH  TWICE DAILY TO PREVENT COUGH OR  WHEEZE. RINSE, GARGLE, AND SPIT  AFTER USE 32.1 g 1   budesonide (PULMICORT) 0.5 MG/2ML nebulizer solution Take 2 mLs (0.5 mg total) by nebulization in the morning and at bedtime. During respiratory infections for 1-2 weeks at a time. 120 mL 2   bumetanide (BUMEX) 1 MG tablet TAKE 1 TABLET(1 MG) BY MOUTH DAILY 30 tablet 0   cetirizine (ZYRTEC) 10 MG  tablet Take 1 tablet by mouth daily.     Cholecalciferol 50 MCG (2000 UT) TABS Take 2,000 Units by mouth  daily.     clopidogrel (PLAVIX) 75 MG tablet TAKE 1 TABLET BY MOUTH DAILY 100 tablet 2   Continuous Blood Gluc Sensor (DEXCOM G6 SENSOR) MISC USE 1 SENSOR AS NEEDED CHANGE EVERY 10 DAYS     Continuous Blood Gluc Transmit (DEXCOM G6 TRANSMITTER) MISC      cromolyn (OPTICROM) 4 % ophthalmic solution Place 1 drop into both eyes 4 (four) times daily. INSTILL 1 DROP INTO BOTH EYES UP TO 4 TIMES DAILY AS NEEDED FOR  RED, ITCHY, OR WATERY EYES 60 mL 1   empagliflozin (JARDIANCE) 10 MG TABS tablet Take 1 tablet (10 mg total) by mouth daily before breakfast. 30 tablet 3   EPINEPHrine (EPIPEN 2-PAK) 0.3 mg/0.3 mL IJ SOAJ injection Use as directed for severe allergic reactions 2 each 3   FASENRA PEN 30 MG/ML SOAJ Inject into the skin.     gabapentin (NEURONTIN) 300 MG capsule Take 1 capsule (300 mg total) by mouth 3 (three) times daily. 270 capsule 1   glucagon 1 MG injection Inject 1 mg into the muscle once as needed (low blood sugar).     glucose 4 GM chewable tablet Chew 1 tablet by mouth once as needed for low blood sugar.     HUMALOG 100 UNIT/ML injection Inject into the skin.     HUMULIN N 100 UNIT/ML injection Inject 60 Units into the skin 3 (three) times daily before meals.     Insulin Disposable Pump (OMNIPOD 5 G6 INTRO, GEN 5,) KIT CHANGE POD EVERY 2 DAYS     isosorbide mononitrate (IMDUR) 60 MG 24 hr tablet TAKE 1 TABLET BY MOUTH DAILY 100 tablet 2   levalbuterol (XOPENEX HFA) 45 MCG/ACT inhaler USE 2 INHALATIONS BY MOUTH EVERY 4 TO 6 HOURS AS NEEDED FOR  SHORTNESS OF BREATH , COUGH,  WHEEZE AND TIGHTNESS IN CHEST 45 g 6   levocetirizine (XYZAL) 5 MG tablet TAKE 1 TABLET BY MOUTH EVERY  EVENING IF NEEDED 100 tablet 3   lisinopril (ZESTRIL) 20 MG tablet TAKE 1 TABLET BY MOUTH DAILY 100 tablet 2   methocarbamol (ROBAXIN) 500 MG tablet Take 1 tablet (500 mg total) by mouth every 8 (eight) hours as needed for muscle spasms. 20 tablet 0   metoprolol tartrate (LOPRESSOR) 50 MG tablet TAKE 1 TABLET BY  MOUTH EVERY 12  HOURS 60 tablet 11   montelukast (SINGULAIR) 10 MG tablet TAKE 1 TABLET BY MOUTH AT  BEDTIME 100 tablet 1   nitroGLYCERIN (NITROSTAT) 0.4 MG SL tablet Place 1 tablet (0.4 mg total) under the tongue every 5 (five) minutes as needed for chest pain. 25 tablet 3   OZEMPIC, 2 MG/DOSE, 8 MG/3ML SOPN Inject 2 mg into the skin once a week.     pantoprazole (PROTONIX) 40 MG tablet Take 1 tablet (40 mg total) by mouth 2 (two) times daily. 200 tablet 3   potassium chloride (KLOR-CON) 10 MEQ tablet Take 1 tablet (10 mEq total) by mouth daily. 90 tablet 3   Respiratory Therapy Supplies (CARETOUCH 2 CPAP HOSE HANGER) MISC by Does not apply route.     rosuvastatin (CRESTOR) 40 MG tablet Take 1 tablet (40 mg total) by mouth at bedtime. 90 tablet 3   silver sulfADIAZINE (SILVADENE) 1 % cream Apply 1 Application topically daily. 50 g 0   Facility-Administered Medications Prior to Visit  Medication Dose Route  Frequency Provider Last Rate Last Admin   Benralizumab SOSY 30 mg  30 mg Subcutaneous Q8 Lynnae January, FNP   30 mg at 07/14/22 1408    Allergies  Allergen Reactions   Contrast Media [Iodinated Contrast Media] Shortness Of Breath and Other (See Comments)    sob, chest tight   Morphine Itching   Statins Diarrhea and Other (See Comments)    nausea, diarrhea, myalgias   Ioversol     Review of Systems  Constitutional:  Negative for fever and malaise/fatigue.  HENT:  Negative for congestion.   Eyes:  Negative for blurred vision.  Respiratory:  Negative for shortness of breath.   Cardiovascular:  Negative for chest pain, palpitations and leg swelling.  Gastrointestinal:  Negative for abdominal pain, blood in stool and nausea.  Genitourinary:  Negative for dysuria and frequency.  Musculoskeletal:  Negative for falls.  Skin:  Negative for rash.  Neurological:  Positive for tingling and sensory change. Negative for dizziness, loss of consciousness and headaches.   Endo/Heme/Allergies:  Negative for environmental allergies.  Psychiatric/Behavioral:  Negative for depression. The patient is not nervous/anxious.        Objective:    Physical Exam Vitals and nursing note reviewed.  Musculoskeletal:        General: Swelling present.  Neurological:     Sensory: Sensory deficit present.     There were no vitals taken for this visit. Wt Readings from Last 3 Encounters:  08/20/22 240 lb 1.6 oz (108.9 kg)  06/30/22 245 lb 12.8 oz (111.5 kg)  06/19/22 249 lb 3.2 oz (113 kg)       Assessment & Plan:  Bilateral carpal tunnel syndrome -     Ambulatory referral to Hand Surgery   Assessment and Plan    Carpal Tunnel Syndrome: Recurrent symptoms in the left hand with numbness, tingling, and pain extending up to the forearm. History of previous carpal tunnel surgery on both hands. Symptoms exacerbated by physical activity. -Refer to hand specialist for further evaluation and management. -Advise to purchase a splint from the pharmacy or sporting goods store. -Recommend Voltaren gel for topical pain relief and ice application at the end of the day.  Diabetic Footwear: Patient reports not receiving diabetic shoes despite previous order. -Resend prescription for diabetic shoes.        I discussed the assessment and treatment plan with the patient. The patient was provided an opportunity to ask questions and all were answered. The patient agreed with the plan and demonstrated an understanding of the instructions.   The patient was advised to call back or seek an in-person evaluation if the symptoms worsen or if the condition fails to improve as anticipated.  Donato Schultz, DO [place]  [facility] [phone] (phone) [phone] (fax)  [facility]

## 2022-08-26 NOTE — Telephone Encounter (Signed)
Pt had virtual today and mention that podiatry didn't receive the Therapeutic shoe form. Form was printed from [employer] and faxed to  Daleen Bo, DPM office at [phone]

## 2022-09-02 ENCOUNTER — Ambulatory Visit: Payer: 59 | Admitting: Family Medicine

## 2022-09-03 ENCOUNTER — Ambulatory Visit (INDEPENDENT_AMBULATORY_CARE_PROVIDER_SITE_OTHER): Payer: 59 | Admitting: Orthopaedic Surgery

## 2022-09-03 ENCOUNTER — Encounter: Payer: Self-pay | Admitting: Orthopaedic Surgery

## 2022-09-03 DIAGNOSIS — G5602 Carpal tunnel syndrome, left upper limb: Secondary | ICD-10-CM

## 2022-09-03 DIAGNOSIS — G5601 Carpal tunnel syndrome, right upper limb: Secondary | ICD-10-CM

## 2022-09-03 NOTE — Addendum Note (Signed)
Addended by: Wendi Maya on: 09/03/2022 12:46 PM   Modules accepted: Orders
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
## 2022-09-04 ENCOUNTER — Ambulatory Visit: Payer: 59 | Admitting: Family Medicine

## 2022-09-04 VITALS — BP 118/60 | HR 85 | Temp 98.1°F | Resp 18 | Ht 65.0 in | Wt 245.0 lb

## 2022-09-04 DIAGNOSIS — E1122 Type 2 diabetes mellitus with diabetic chronic kidney disease: Secondary | ICD-10-CM | POA: Diagnosis not present

## 2022-09-04 DIAGNOSIS — K219 Gastro-esophageal reflux disease without esophagitis: Secondary | ICD-10-CM

## 2022-09-04 DIAGNOSIS — I5043 Acute on chronic combined systolic (congestive) and diastolic (congestive) heart failure: Secondary | ICD-10-CM

## 2022-09-04 DIAGNOSIS — I25118 Atherosclerotic heart disease of native coronary artery with other forms of angina pectoris: Secondary | ICD-10-CM | POA: Diagnosis not present

## 2022-09-04 DIAGNOSIS — N183 Chronic kidney disease, stage 3 unspecified: Secondary | ICD-10-CM

## 2022-09-04 DIAGNOSIS — I509 Heart failure, unspecified: Secondary | ICD-10-CM | POA: Insufficient documentation

## 2022-09-04 DIAGNOSIS — N1832 Chronic kidney disease, stage 3b: Secondary | ICD-10-CM | POA: Diagnosis not present

## 2022-09-04 DIAGNOSIS — E1165 Type 2 diabetes mellitus with hyperglycemia: Secondary | ICD-10-CM

## 2022-09-04 DIAGNOSIS — Z794 Long term (current) use of insulin: Secondary | ICD-10-CM | POA: Diagnosis not present

## 2022-09-04 DIAGNOSIS — E785 Hyperlipidemia, unspecified: Secondary | ICD-10-CM | POA: Diagnosis not present

## 2022-09-04 NOTE — Assessment & Plan Note (Signed)
Per endo Last A1c --- 7.2

## 2022-09-04 NOTE — Assessment & Plan Note (Signed)
Per nephrology 

## 2022-09-04 NOTE — Assessment & Plan Note (Signed)
Stable on protonix

## 2022-09-04 NOTE — Assessment & Plan Note (Signed)
Per cardiology 
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
## 2022-09-04 NOTE — Assessment & Plan Note (Signed)
Tolerating statin, encouraged heart healthy diet, avoid trans fats, minimize simple carbs and saturated fats. Increase exercise as tolerated 

## 2022-09-05 LAB — CBC WITH DIFFERENTIAL/PLATELET
Basophils Absolute: 0.1 10*3/uL (ref 0.0–0.1)
Basophils Relative: 1.2 % (ref 0.0–3.0)
Eosinophils Absolute: 0 10*3/uL (ref 0.0–0.7)
Eosinophils Relative: 0 % (ref 0.0–5.0)
HCT: 39.7 % (ref 36.0–46.0)
Hemoglobin: 12.5 g/dL (ref 12.0–15.0)
Lymphocytes Relative: 51 % — ABNORMAL HIGH (ref 12.0–46.0)
Lymphs Abs: 3 10*3/uL (ref 0.7–4.0)
MCHC: 31.5 g/dL (ref 30.0–36.0)
MCV: 82.3 fl (ref 78.0–100.0)
Monocytes Absolute: 0.4 10*3/uL (ref 0.1–1.0)
Monocytes Relative: 6.5 % (ref 3.0–12.0)
Neutro Abs: 2.4 10*3/uL (ref 1.4–7.7)
Neutrophils Relative %: 41.3 % — ABNORMAL LOW (ref 43.0–77.0)
Platelets: 274 10*3/uL (ref 150.0–400.0)
RBC: 4.83 Mil/uL (ref 3.87–5.11)
RDW: 16.5 % — ABNORMAL HIGH (ref 11.5–15.5)
WBC: 5.9 10*3/uL (ref 4.0–10.5)

## 2022-09-05 LAB — COMPREHENSIVE METABOLIC PANEL
ALT: 15 U/L (ref 0–35)
AST: 18 U/L (ref 0–37)
Albumin: 4.2 g/dL (ref 3.5–5.2)
Alkaline Phosphatase: 57 U/L (ref 39–117)
BUN: 15 mg/dL (ref 6–23)
CO2: 27 mEq/L (ref 19–32)
Calcium: 9.8 mg/dL (ref 8.4–10.5)
Chloride: 103 mEq/L (ref 96–112)
Creatinine, Ser: 1.28 mg/dL — ABNORMAL HIGH (ref 0.40–1.20)
GFR: 43.01 mL/min — ABNORMAL LOW (ref >60.00)
Glucose, Bld: 169 mg/dL — ABNORMAL HIGH (ref 70–99)
Potassium: 4 mEq/L (ref 3.5–5.1)
Sodium: 138 mEq/L (ref 135–145)
Total Bilirubin: 0.4 mg/dL (ref 0.2–1.2)
Total Protein: 6.8 g/dL (ref 6.0–8.3)

## 2022-09-05 LAB — LIPID PANEL
Cholesterol: 108 mg/dL (ref 0–200)
HDL: 29.5 mg/dL — ABNORMAL LOW (ref >39.00)
LDL Cholesterol: 54 mg/dL (ref 0–99)
NonHDL: 78.05
Total CHOL/HDL Ratio: 4
Triglycerides: 121 mg/dL (ref 0.0–149.0)
VLDL: 24.2 mg/dL (ref 0.0–40.0)

## 2022-09-08 ENCOUNTER — Ambulatory Visit (INDEPENDENT_AMBULATORY_CARE_PROVIDER_SITE_OTHER): Payer: 59 | Admitting: *Deleted

## 2022-09-08 DIAGNOSIS — M9903 Segmental and somatic dysfunction of lumbar region: Secondary | ICD-10-CM | POA: Diagnosis not present

## 2022-09-08 DIAGNOSIS — J4550 Severe persistent asthma, uncomplicated: Secondary | ICD-10-CM | POA: Diagnosis not present

## 2022-09-08 DIAGNOSIS — M9902 Segmental and somatic dysfunction of thoracic region: Secondary | ICD-10-CM | POA: Diagnosis not present

## 2022-09-09 ENCOUNTER — Ambulatory Visit: Payer: 59 | Admitting: Orthopaedic Surgery

## 2022-09-09 DIAGNOSIS — M9902 Segmental and somatic dysfunction of thoracic region: Secondary | ICD-10-CM | POA: Diagnosis not present

## 2022-09-09 DIAGNOSIS — M9903 Segmental and somatic dysfunction of lumbar region: Secondary | ICD-10-CM | POA: Diagnosis not present

## 2022-09-10 DIAGNOSIS — M9903 Segmental and somatic dysfunction of lumbar region: Secondary | ICD-10-CM | POA: Diagnosis not present

## 2022-09-10 DIAGNOSIS — M9902 Segmental and somatic dysfunction of thoracic region: Secondary | ICD-10-CM | POA: Diagnosis not present

## 2022-09-11 ENCOUNTER — Other Ambulatory Visit: Payer: Self-pay | Admitting: Family Medicine

## 2022-09-11 DIAGNOSIS — Z1231 Encounter for screening mammogram for malignant neoplasm of breast: Secondary | ICD-10-CM

## 2022-09-15 ENCOUNTER — Encounter: Payer: Self-pay | Admitting: Physical Medicine and Rehabilitation

## 2022-09-15 ENCOUNTER — Ambulatory Visit: Payer: 59 | Admitting: Physical Medicine and Rehabilitation

## 2022-09-15 DIAGNOSIS — M5416 Radiculopathy, lumbar region: Secondary | ICD-10-CM | POA: Diagnosis not present

## 2022-09-15 DIAGNOSIS — M47816 Spondylosis without myelopathy or radiculopathy, lumbar region: Secondary | ICD-10-CM | POA: Diagnosis not present

## 2022-09-15 DIAGNOSIS — G8929 Other chronic pain: Secondary | ICD-10-CM

## 2022-09-15 DIAGNOSIS — M5442 Lumbago with sciatica, left side: Secondary | ICD-10-CM

## 2022-09-15 MED ORDER — DIAZEPAM 5 MG PO TABS: ORAL_TABLET | ORAL | 0 refills | Status: DC

## 2022-09-15 MED ORDER — TRAMADOL HCL 50 MG PO TABS: 50.0000 mg | ORAL_TABLET | Freq: Three times a day (TID) | ORAL | 0 refills | Status: DC | PRN

## 2022-09-15 NOTE — Progress Notes (Signed)
Functional Pain Scale - descriptive words and definitions  Intense (8)    Cannot complete any ADLs without much assistance/cannot concentrate/conversation is difficult/unable to sleep and unable to use distraction. Severe range order  Average Pain 10  Lower back pain all the way across. Pain is radiating into both legs and around to the abdomen. Nothing is helping pain
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
## 2022-09-16 ENCOUNTER — Ambulatory Visit: Payer: Self-pay | Admitting: Licensed Clinical Social Worker

## 2022-09-16 NOTE — Patient Outreach (Signed)
  Care Coordination  Follow Up Visit Note   09/16/2022 Name: Katrina Vega MRN: [id number] DOB: 05-Nov-1955  Katrina Vega is a 67 y.o. year old female who sees Zola Button, Grayling Congress, DO for primary care. I spoke with  Katrina Vega by phone today.  What matters to the patients health and wellness today?  Managing her health  Goal is to connect for ongoing therapy to manager stressors   Goals Addressed             This Visit's Progress    Manage mental health and connect for therapy       Activities and task to complete in order to accomplish goals.   Call [facility] [phone] to schedule a therapy appointment Keep all upcoming appointment discussed today Continue with compliance of taking medication prescribed by Doctor Self Support options  (continue working self care and focusing on your heath )         SDOH assessments and interventions completed:  No   Care Coordination Interventions:  Yes, provided  Interventions Today    Flowsheet Row Most Recent Value  Chronic Disease   Chronic disease during today's visit Congestive Heart Failure (CHF), Chronic Obstructive Pulmonary Disease (COPD), Hypertension (HTN), Chronic Kidney Disease/End Stage Renal Disease (ESRD), Diabetes  Education Interventions   Education Provided Provided Education  [reviewed therapy options, and provided information]  Provided Verbal Education On Mental Health/Coping with Illness  Mental Health Interventions   Mental Health Discussed/Reviewed Mental Health Reviewed, Coping Strategies  [active listening, motivational interviewing, solution focused, task centerer and problem solving]       Follow up plan: Follow up call scheduled for 10/07/22    Encounter Outcome:  Pt. Visit Completed   Sammuel Hines, LCSW Social Work Care Coordination  [facility] [employer] [phone]

## 2022-09-16 NOTE — Patient Instructions (Signed)
Social Work Visit Information  Thank you for taking time to visit with me today. Please don't hesitate to contact me if I can be of assistance to you.   Following are the goals we discussed today:   Goals Addressed             This Visit's Progress    Manage mental health and connect for therapy       Activities and task to complete in order to accomplish goals.   Call [facility] [phone] to schedule a therapy appointment Keep all upcoming appointment discussed today Continue with compliance of taking medication prescribed by Doctor Self Support options  (continue working self care and focusing on your heath )          Our next appointment is by telephone on 10/07/22 at 3:15   Please call the care guide team at [phone] if you need to cancel or reschedule your appointment.   If you or anyone you know are experiencing a Mental Health or Behavioral Health Crisis or need someone to talk to, please call the Suicide and Crisis Lifeline: 988 call the [place] National Suicide Prevention Lifeline: 6295909196 or TTY: (415)659-2641 TTY 6503284736) to talk to a trained counselor call 1-800-273-TALK (toll free, 24 hour hotline) go to [facility] [address] [place] [phone])   Patient verbalizes understanding of instructions and care plan provided today and agrees to view in MyChart. Active MyChart status and patient understanding of how to access instructions and care plan via MyChart confirmed with patient.       Katrina Hines, LCSW Social Work Care Coordination  [facility] [employer] [phone]

## 2022-09-17 ENCOUNTER — Ambulatory Visit: Payer: 59 | Admitting: Physical Medicine and Rehabilitation

## 2022-09-17 DIAGNOSIS — M79642 Pain in left hand: Secondary | ICD-10-CM | POA: Diagnosis not present

## 2022-09-17 DIAGNOSIS — R202 Paresthesia of skin: Secondary | ICD-10-CM | POA: Diagnosis not present

## 2022-09-17 DIAGNOSIS — M25532 Pain in left wrist: Secondary | ICD-10-CM

## 2022-09-17 DIAGNOSIS — M542 Cervicalgia: Secondary | ICD-10-CM | POA: Diagnosis not present

## 2022-09-17 DIAGNOSIS — M79641 Pain in right hand: Secondary | ICD-10-CM

## 2022-09-17 NOTE — Progress Notes (Signed)
Functional Pain Scale - descriptive words and definitions  Distracting (5)    Aware of pain/able to complete some ADL's but limited by pain/sleep is affected and active distractions are only slightly useful. Moderate range order  Average Pain  varies  Bilateral hand and wrist pain, left is worse than right. Pain when moving middle and ring fingers on hands

## 2022-09-18 ENCOUNTER — Ambulatory Visit
Admission: RE | Admit: 2022-09-18 | Discharge: 2022-09-18 | Disposition: A | Discharge status: Home / Self Care | Payer: 59 | Source: Ambulatory Visit | Attending: Physical Medicine and Rehabilitation | Admitting: Physical Medicine and Rehabilitation

## 2022-09-18 ENCOUNTER — Ambulatory Visit
Admission: RE | Admit: 2022-09-18 | Discharge: 2022-09-18 | Disposition: A | Discharge status: Home / Self Care | Payer: 59 | Source: Ambulatory Visit | Attending: Family Medicine | Admitting: Family Medicine

## 2022-09-18 DIAGNOSIS — M47816 Spondylosis without myelopathy or radiculopathy, lumbar region: Secondary | ICD-10-CM

## 2022-09-18 DIAGNOSIS — Z1231 Encounter for screening mammogram for malignant neoplasm of breast: Secondary | ICD-10-CM

## 2022-09-18 DIAGNOSIS — M545 Low back pain, unspecified: Secondary | ICD-10-CM | POA: Diagnosis not present

## 2022-09-18 DIAGNOSIS — M5416 Radiculopathy, lumbar region: Secondary | ICD-10-CM

## 2022-09-18 DIAGNOSIS — G8929 Other chronic pain: Secondary | ICD-10-CM

## 2022-09-19 NOTE — Procedures (Unsigned)
EMG & NCV Findings: Evaluation of the left median motor and the right median motor nerves showed decreased conduction velocity (Elbow-Wrist, L41, R45 m/s).  The left ulnar motor nerve showed decreased conduction velocity (B Elbow-Wrist, 51 m/s) and decreased conduction velocity (A Elbow-B Elbow, 50 m/s).  The left median (across palm) sensory and the right median (across palm) sensory nerves showed no response (Palm) and prolonged distal peak latency (L4.4, R4.6 ms).  The right ulnar sensory nerve showed no response (Wrist).  All remaining nerves (as indicated in the following tables) were within normal limits.  Left vs. Right side comparison data for the ulnar motor nerve indicates abnormal L-R amplitude difference (41.1 %).  All remaining left vs. right side differences were within normal limits.    All examined muscles (as indicated in the following table) showed no evidence of electrical instability.    Impression: The above electrodiagnostic study is ABNORMAL and reveals evidence of a mild bilateral median nerve entrapment at the wrist (carpal tunnel syndrome) affecting sensory components. 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 There is no significant electrodiagnostic evidence of any other focal nerve entrapment, brachial plexopathy or cervical radiculopathy.   Recommendations: 1.  Follow-up with referring physician. 2.  Continue current management of symptoms. 3.  Continue use of resting splint at night-time and as needed during the day.  ___________________________ Elease Hashimoto Board Certified, American Board of Physical Medicine and Rehabilitation    Nerve Conduction Studies Anti Sensory Summary Table   Stim Site NR Peak (ms) Norm Peak (ms) P-T Amp (V) Norm P-T Amp Site1 Site2 Delta-P (ms) Dist (cm) Vel (m/s) Norm Vel (m/s)  Left Median Acr Palm Anti Sensory (2nd Digit)  30.9C  Wrist    *4.4 <3.6 12.0 >10  Wrist Palm  0.0    Palm *NR  <2.0          Right Median Acr Palm Anti Sensory (2nd Digit)  29.1C  Wrist    *4.6 <3.6 20.3 >10 Wrist Palm  0.0    Palm *NR  <2.0          Left Radial Anti Sensory (Base 1st Digit)  29.9C  Wrist    2.7 <3.1 12.8  Wrist Base 1st Digit 2.7 0.0    Right Radial Anti Sensory (Base 1st Digit)  27C  Wrist    2.6 <3.1 15.3  Wrist Base 1st Digit 2.6 0.0    Left Ulnar Anti Sensory (5th Digit)  31.1C  Wrist    3.7 <3.7 20.4 >15.0 Wrist 5th Digit 3.7 14.0 38 >38  Right Ulnar Anti Sensory (5th Digit)  30C  Wrist *NR  <3.7  >15.0 Wrist 5th Digit  14.0  >38   Motor Summary Table   Stim Site NR Onset (ms) Norm Onset (ms) O-P Amp (mV) Norm O-P Amp Site1 Site2 Delta-0 (ms) Dist (cm) Vel (m/s) Norm Vel (m/s)  Left Median Motor (Abd Poll Brev)  30.4C  Wrist    3.7 <4.2 10.0 >5 Elbow Wrist 5.0 20.5 *41 >50  Elbow    8.7  6.0         Right Median Motor (Abd Poll Brev)  30.1C  Wrist    4.0 <4.2 9.4 >5 Elbow Wrist 4.8 21.5 *45 >50  Elbow    8.8  7.2         Left Ulnar Motor (Abd Dig Min)  30.3C  Wrist    3.3 <4.2 7.3 >3 B Elbow Wrist  3.9 20.0 *51 >53  B Elbow    7.2  6.6  A Elbow B Elbow 2.0 10.0 *50 >53  A Elbow    9.2  1.5         Right Ulnar Motor (Abd Dig Min)  30.1C  Wrist    3.3 <4.2 4.3 >3 B Elbow Wrist 3.3 19.0 58 >53  B Elbow    6.6  6.8  A Elbow B Elbow 2.0 11.0 55 >53  A Elbow    8.6  3.6          EMG   Side Muscle Nerve Root Ins Act Fibs Psw Amp Dur Poly Recrt Int Dennie Bible Comment  Left Abd Poll Brev Median C8-T1 Nml Nml Nml Nml Nml 0 Nml Nml   Left 1stDorInt Ulnar C8-T1 Nml Nml Nml Nml Nml 0 Nml Nml   Left PronatorTeres Median C6-7 Nml Nml Nml Nml Nml 0 Nml Nml   Left Biceps Musculocut C5-6 Nml Nml Nml Nml Nml 0 Nml Nml   Left Deltoid Axillary C5-6 Nml Nml Nml Nml Nml 0 Nml Nml     Nerve Conduction Studies Anti Sensory Left/Right Comparison   Stim Site L Lat (ms) R Lat (ms) L-R Lat (ms) L Amp (V) R Amp (V) L-R Amp (%) Site1 Site2 L Vel (m/s) R Vel  (m/s) L-R Vel (m/s)  Median Acr Palm Anti Sensory (2nd Digit)  30.9C  Wrist *4.4 *4.6 0.2 12.0 20.3 40.9 Wrist Palm     Palm             Radial Anti Sensory (Base 1st Digit)  29.9C  Wrist 2.7 2.6 0.1 12.8 15.3 16.3 Wrist Base 1st Digit     Ulnar Anti Sensory (5th Digit)  31.1C  Wrist 3.7   20.4   Wrist 5th Digit 38     Motor Left/Right Comparison   Stim Site L Lat (ms) R Lat (ms) L-R Lat (ms) L Amp (mV) R Amp (mV) L-R Amp (%) Site1 Site2 L Vel (m/s) R Vel (m/s) L-R Vel (m/s)  Median Motor (Abd Poll Brev)  30.4C  Wrist 3.7 4.0 0.3 10.0 9.4 6.0 Elbow Wrist *41 *45 4  Elbow 8.7 8.8 0.1 6.0 7.2 16.7       Ulnar Motor (Abd Dig Min)  30.3C  Wrist 3.3 3.3 0.0 7.3 4.3 *41.1 B Elbow Wrist *51 58 7  B Elbow 7.2 6.6 0.6 6.6 6.8 2.9 A Elbow B Elbow *50 55 5  A Elbow 9.2 8.6 0.6 1.5 3.6 58.3          Waveforms:

## 2022-09-22 ENCOUNTER — Other Ambulatory Visit: Payer: 59

## 2022-09-23 ENCOUNTER — Encounter: Payer: Self-pay | Admitting: Physical Medicine and Rehabilitation

## 2022-09-23 ENCOUNTER — Encounter: Payer: Self-pay | Admitting: Family Medicine

## 2022-09-23 ENCOUNTER — Ambulatory Visit (INDEPENDENT_AMBULATORY_CARE_PROVIDER_SITE_OTHER): Payer: 59 | Admitting: Family Medicine

## 2022-09-23 VITALS — BP 134/80 | HR 82 | Temp 98.6°F | Resp 18 | Ht 65.0 in | Wt 240.6 lb

## 2022-09-23 DIAGNOSIS — K2100 Gastro-esophageal reflux disease with esophagitis, without bleeding: Secondary | ICD-10-CM

## 2022-09-23 DIAGNOSIS — K449 Diaphragmatic hernia without obstruction or gangrene: Secondary | ICD-10-CM

## 2022-09-23 DIAGNOSIS — L0231 Cutaneous abscess of buttock: Secondary | ICD-10-CM | POA: Insufficient documentation

## 2022-09-23 DIAGNOSIS — R1030 Lower abdominal pain, unspecified: Secondary | ICD-10-CM

## 2022-09-23 LAB — POC URINALSYSI DIPSTICK (AUTOMATED)
Blood, UA: NEGATIVE
Glucose, UA: POSITIVE — AB
Leukocytes, UA: NEGATIVE
Nitrite, UA: NEGATIVE
Protein, UA: POSITIVE — AB
Spec Grav, UA: 1.015 (ref 1.010–1.025)
Urobilinogen, UA: 0.2 E.U./dL
pH, UA: 6.5 (ref 5.0–8.0)

## 2022-09-23 LAB — CBC WITH DIFFERENTIAL/PLATELET
Basophils Absolute: 0 10*3/uL (ref 0.0–0.1)
Basophils Relative: 0.7 % (ref 0.0–3.0)
Eosinophils Absolute: 0 10*3/uL (ref 0.0–0.7)
Eosinophils Relative: 0 % (ref 0.0–5.0)
HCT: 41.2 % (ref 36.0–46.0)
Hemoglobin: 12.8 g/dL (ref 12.0–15.0)
Lymphocytes Relative: 38.4 % (ref 12.0–46.0)
Lymphs Abs: 1.9 10*3/uL (ref 0.7–4.0)
MCHC: 31.2 g/dL (ref 30.0–36.0)
MCV: 81.9 fl (ref 78.0–100.0)
Monocytes Absolute: 0.4 10*3/uL (ref 0.1–1.0)
Monocytes Relative: 7.4 % (ref 3.0–12.0)
Neutro Abs: 2.6 10*3/uL (ref 1.4–7.7)
Neutrophils Relative %: 53.5 % (ref 43.0–77.0)
Platelets: 300 10*3/uL (ref 150.0–400.0)
RBC: 5.03 Mil/uL (ref 3.87–5.11)
RDW: 17.1 % — ABNORMAL HIGH (ref 11.5–15.5)
WBC: 4.9 10*3/uL (ref 4.0–10.5)

## 2022-09-23 LAB — COMPREHENSIVE METABOLIC PANEL
ALT: 11 U/L (ref 0–35)
AST: 12 U/L (ref 0–37)
Albumin: 4.4 g/dL (ref 3.5–5.2)
Alkaline Phosphatase: 66 U/L (ref 39–117)
BUN: 10 mg/dL (ref 6–23)
CO2: 27 mEq/L (ref 19–32)
Calcium: 10 mg/dL (ref 8.4–10.5)
Chloride: 103 mEq/L (ref 96–112)
Creatinine, Ser: 1.08 mg/dL (ref 0.40–1.20)
GFR: 52.71 mL/min — ABNORMAL LOW (ref >60.00)
Glucose, Bld: 130 mg/dL — ABNORMAL HIGH (ref 70–99)
Potassium: 3.8 mEq/L (ref 3.5–5.1)
Sodium: 138 mEq/L (ref 135–145)
Total Bilirubin: 0.7 mg/dL (ref 0.2–1.2)
Total Protein: 6.9 g/dL (ref 6.0–8.3)

## 2022-09-23 LAB — H. PYLORI ANTIBODY, IGG: H Pylori IgG: NEGATIVE

## 2022-09-23 MED ORDER — FAMOTIDINE 20 MG PO TABS: 20.0000 mg | ORAL_TABLET | Freq: Two times a day (BID) | ORAL | 2 refills | Status: DC

## 2022-09-23 MED ORDER — LIDOCAINE VISCOUS HCL 2 % MT SOLN
15.0000 mL | Freq: Once | OROMUCOSAL | Status: AC
Start: 2022-09-23 — End: 2022-09-23
  Administered 2022-09-23: 15 mL via OROMUCOSAL

## 2022-09-23 MED ORDER — DOXYCYCLINE HYCLATE 100 MG PO TABS
100.0000 mg | ORAL_TABLET | Freq: Two times a day (BID) | ORAL | 0 refills | Status: AC
Start: 2022-09-23 — End: ?

## 2022-09-23 MED ORDER — FLUCONAZOLE 150 MG PO TABS: 150.0000 mg | ORAL_TABLET | Freq: Every day | ORAL | 0 refills | Status: DC

## 2022-09-23 MED ORDER — HYOSCYAMINE SULFATE 0.125 MG PO TBDP
0.2500 mg | ORAL_TABLET | Freq: Once | ORAL | Status: AC
Start: 2022-09-23 — End: 2022-09-23
  Administered 2022-09-23: 0.25 mg via SUBLINGUAL

## 2022-09-23 MED ORDER — ALUM & MAG HYDROXIDE-SIMETH 200-200-20 MG/5ML PO SUSP
30.0000 mL | Freq: Once | ORAL | Status: AC
Start: 2022-09-23 — End: 2022-09-23
  Administered 2022-09-23: 30 mL via ORAL

## 2022-09-23 MED ORDER — PANTOPRAZOLE SODIUM 40 MG PO TBEC: 40.0000 mg | DELAYED_RELEASE_TABLET | Freq: Every day | ORAL | 3 refills | Status: DC

## 2022-09-23 NOTE — Progress Notes (Signed)
Katrina Vega - 67 y.o. female MRN [id number]  Date of birth: 1956/01/06  Office Visit Note: Visit Date: 09/17/2022 PCP: Donato Schultz, DO Referred by: Tarry Kos, MD  Subjective: Chief Complaint  Patient presents with   Right Wrist - Pain   Left Wrist - Pain   Right Hand - Numbness, Pain   Left Hand - Numbness, Pain   HPI:  Katrina Vega is a 67 y.o. female who comes in today at the request of Dr. Glee Arvin for evaluation and management of chronic, worsening and severe pain, numbness and tingling in the Bilateral upper extremities.  Patient is Right hand dominant.  She reports recent worsening of bilateral left more than right hand pain particularly with moving of the fingers but also paresthesias and numbness but more globally in the fingertips.  She has a history of prior carpal tunnel syndrome and carpal tunnel release which was completed in .  No electrodiagnostic studies to review.  She does report good relief with those decompression procedures at the time.  This was done more than 10 years ago.  She has some neck pain but no frank radicular complaints.  She does get pain in the wrist and thumb area as well.  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  At least one urinalysis in January with glucose spillover.   I spent more than 30 minutes speaking face-to-face with the patient with 50% of the time in counseling and discussing coordination of care.      Review of Systems  Musculoskeletal:  Positive for back pain and neck pain.  Neurological:  Positive for tingling.  All other systems reviewed and are negative.  Otherwise per HPI.  Assessment & Plan: Visit Diagnoses:    ICD-10-CM   1. Paresthesia of skin  R20.2 NCV with EMG (electromyography)    2. Bilateral hand pain  M79.641    M79.642     3. Pain in left wrist  M25.532     4. Cervicalgia  M54.2       Plan: Impression: Differential  diagnosis is musculoskeletal pain and arthritis with potential for carpal tunnel syndrome and underlying peripheral polyneuropathy.  Electrodiagnostic study performed.  The above electrodiagnostic study is ABNORMAL and reveals evidence of a mild bilateral median nerve entrapment at the wrist (carpal tunnel syndrome) affecting sensory components.   The study also reveals evidence suggestive of underlying peripheral neuropathy of bilateral upper extremities.   There is no significant electrodiagnostic evidence of any other focal nerve entrapment, brachial plexopathy or cervical radiculopathy.   Recommendations: 1.  Follow-up with referring physician.  Consider neurology workup for neuropathy if needed. 2.  Continue current management of symptoms.  Clinical correlation with symptoms is paramount. 3.  Continue use of resting splint at night-time and as needed during the day.  Meds & Orders: No orders of the defined types were placed in this encounter.   Orders Placed This Encounter  Procedures   NCV with EMG (electromyography)    Follow-up: Return for  Glee Arvin, MD.   Procedures: No procedures performed  EMG & NCV Findings: Evaluation of the left median motor and the right median motor nerves showed decreased conduction velocity (Elbow-Wrist, L41, R45 m/s).  The left ulnar motor nerve showed decreased conduction velocity (B Elbow-Wrist, 51 m/s) and decreased conduction velocity (A Elbow-B Elbow, 50 m/s).  The left median (across palm) sensory and the right median (across  palm) sensory nerves showed no response (Palm) and prolonged distal peak latency (L4.4, R4.6 ms).  The right ulnar sensory nerve showed no response (Wrist).  All remaining nerves (as indicated in the following tables) were within normal limits.  Left vs. Right side comparison data for the ulnar motor nerve indicates abnormal L-R amplitude difference (41.1 %).  All remaining left vs. right side differences were within normal  limits.    All examined muscles (as indicated in the following table) showed no evidence of electrical instability.    Impression: The above electrodiagnostic study is ABNORMAL and reveals evidence of a mild bilateral median nerve entrapment at the wrist (carpal tunnel syndrome) affecting sensory components.   The study also reveals evidence suggestive of underlying peripheral neuropathy of bilateral upper extremities.   There is no significant electrodiagnostic evidence of any other focal nerve entrapment, brachial plexopathy or cervical radiculopathy.   Recommendations: 1.  Follow-up with referring physician.  Consider neurology workup for neuropathy if needed. 2.  Continue current management of symptoms.  Clinical correlation with symptoms is paramount. 3.  Continue use of resting splint at night-time and as needed during the day.  ___________________________ Elease Hashimoto Board Certified, American Board of Physical Medicine and Rehabilitation    Nerve Conduction Studies Anti Sensory Summary Table   Stim Site NR Peak (ms) Norm Peak (ms) P-T Amp (V) Norm P-T Amp Site1 Site2 Delta-P (ms) Dist (cm) Vel (m/s) Norm Vel (m/s)  Left Median Acr Palm Anti Sensory (2nd Digit)  30.9C  Wrist    *4.4 <3.6 12.0 >10 Wrist Palm  0.0    Palm *NR  <2.0          Right Median Acr Palm Anti Sensory (2nd Digit)  29.1C  Wrist    *4.6 <3.6 20.3 >10 Wrist Palm  0.0    Palm *NR  <2.0          Left Radial Anti Sensory (Base 1st Digit)  29.9C  Wrist    2.7 <3.1 12.8  Wrist Base 1st Digit 2.7 0.0    Right Radial Anti Sensory (Base 1st Digit)  27C  Wrist    2.6 <3.1 15.3  Wrist Base 1st Digit 2.6 0.0    Left Ulnar Anti Sensory (5th Digit)  31.1C  Wrist    3.7 <3.7 20.4 >15.0 Wrist 5th Digit 3.7 14.0 38 >38  Right Ulnar Anti Sensory (5th Digit)  30C  Wrist *NR  <3.7  >15.0 Wrist 5th Digit  14.0  >38   Motor Summary Table   Stim Site NR Onset (ms) Norm Onset (ms) O-P Amp (mV) Norm O-P Amp  Site1 Site2 Delta-0 (ms) Dist (cm) Vel (m/s) Norm Vel (m/s)  Left Median Motor (Abd Poll Brev)  30.4C  Wrist    3.7 <4.2 10.0 >5 Elbow Wrist 5.0 20.5 *41 >50  Elbow    8.7  6.0         Right Median Motor (Abd Poll Brev)  30.1C  Wrist    4.0 <4.2 9.4 >5 Elbow Wrist 4.8 21.5 *45 >50  Elbow    8.8  7.2         Left Ulnar Motor (Abd Dig Min)  30.3C  Wrist    3.3 <4.2 7.3 >3 B Elbow Wrist 3.9 20.0 *51 >53  B Elbow    7.2  6.6  A Elbow B Elbow 2.0 10.0 *50 >53  A Elbow    9.2  1.5  Right Ulnar Motor (Abd Dig Min)  30.1C  Wrist    3.3 <4.2 4.3 >3 B Elbow Wrist 3.3 19.0 58 >53  B Elbow    6.6  6.8  A Elbow B Elbow 2.0 11.0 55 >53  A Elbow    8.6  3.6          EMG   Side Muscle Nerve Root Ins Act Fibs Psw Amp Dur Poly Recrt Int Dennie Bible Comment  Left Abd Poll Brev Median C8-T1 Nml Nml Nml Nml Nml 0 Nml Nml   Left 1stDorInt Ulnar C8-T1 Nml Nml Nml Nml Nml 0 Nml Nml   Left PronatorTeres Median C6-7 Nml Nml Nml Nml Nml 0 Nml Nml   Left Biceps Musculocut C5-6 Nml Nml Nml Nml Nml 0 Nml Nml   Left Deltoid Axillary C5-6 Nml Nml Nml Nml Nml 0 Nml Nml     Nerve Conduction Studies Anti Sensory Left/Right Comparison   Stim Site L Lat (ms) R Lat (ms) L-R Lat (ms) L Amp (V) R Amp (V) L-R Amp (%) Site1 Site2 L Vel (m/s) R Vel (m/s) L-R Vel (m/s)  Median Acr Palm Anti Sensory (2nd Digit)  30.9C  Wrist *4.4 *4.6 0.2 12.0 20.3 40.9 Wrist Palm     Palm             Radial Anti Sensory (Base 1st Digit)  29.9C  Wrist 2.7 2.6 0.1 12.8 15.3 16.3 Wrist Base 1st Digit     Ulnar Anti Sensory (5th Digit)  31.1C  Wrist 3.7   20.4   Wrist 5th Digit 38     Motor Left/Right Comparison   Stim Site L Lat (ms) R Lat (ms) L-R Lat (ms) L Amp (mV) R Amp (mV) L-R Amp (%) Site1 Site2 L Vel (m/s) R Vel (m/s) L-R Vel (m/s)  Median Motor (Abd Poll Brev)  30.4C  Wrist 3.7 4.0 0.3 10.0 9.4 6.0 Elbow Wrist *41 *45 4  Elbow 8.7 8.8 0.1 6.0 7.2 16.7       Ulnar Motor (Abd Dig Min)  30.3C  Wrist 3.3 3.3 0.0 7.3 4.3  *41.1 B Elbow Wrist *51 58 7  B Elbow 7.2 6.6 0.6 6.6 6.8 2.9 A Elbow B Elbow *50 55 5  A Elbow 9.2 8.6 0.6 1.5 3.6 58.3          Waveforms:                      Clinical History: Narrative & Impression CLINICAL DATA:  Low back pain.  No known injury.   EXAM: MRI LUMBAR SPINE WITHOUT CONTRAST   TECHNIQUE: Multiplanar, multisequence MR imaging of the lumbar spine was performed. No intravenous contrast was administered.   COMPARISON:  None.   FINDINGS: Segmentation:  Standard.   Alignment: Trace anterolisthesis L4 on L5 is due to facet degenerative disease. Alignment is otherwise normal.   Vertebrae:  No fracture, evidence of discitis, or bone lesion.   Conus medullaris and cauda equina: Conus extends to the L1-2 level. Conus and cauda equina appear normal.   Paraspinal and other soft tissues: Negative.   Disc levels:   T10-11, T11-12 and T12-L1 are imaged in the sagittal plane only. The central canal and foramina appear open at each level. Shallow disc bulge T12-L1 noted.   L1-2: Mild facet degenerative change.  Otherwise negative.   L2-3: Mild-to-moderate facet degenerative disease. There is a shallow disc bulge. The central canal is open. Mild bilateral foraminal narrowing noted.  L3-4: Moderate bilateral facet arthropathy. There is a shallow disc bulge and some ligamentum flavum thickening. Mild central canal and bilateral foraminal narrowing.   L4-5: Moderate to moderately severe bilateral facet degenerative change. Slight disc uncovering and a minimal bulge are seen. The central canal and foramina remain open.   L5-S1: Mild-to-moderate bilateral facet degenerative disease and a shallow disc bulge. No stenosis.   IMPRESSION: Mild central canal and bilateral foraminal narrowing at L3-4 where there is moderate bilateral facet arthropathy.   Moderate to moderately severe facet degenerative disease at L4-5 results in trace anterolisthesis. No  stenosis at L4-5.     Electronically Signed   By: Drusilla Kanner M.D.   On: 09/11/2020 14:27     Objective:  VS:  HT:    WT:   BMI:     BP:   HR: bpm  TEMP: ( )  RESP:  Physical Exam Vitals and nursing note reviewed.  Constitutional:      General: She is not in acute distress.    Appearance: Normal appearance. She is well-developed. She is obese. She is not ill-appearing.  HENT:     Head: Normocephalic and atraumatic.  Eyes:     Conjunctiva/sclera: Conjunctivae normal.     Pupils: Pupils are equal, round, and reactive to light.  Cardiovascular:     Rate and Rhythm: Normal rate.     Pulses: Normal pulses.  Pulmonary:     Effort: Pulmonary effort is normal.  Musculoskeletal:        General: No swelling, tenderness or deformity.     Right lower leg: No edema.     Left lower leg: No edema.     Comments: Inspection reveals well-healed carpal tunnel release scars bilaterally but no atrophy of the bilateral APB or FDI or hand intrinsics. There is no swelling, color changes, allodynia or dystrophic changes. There is 5 out of 5 strength in the bilateral wrist extension, finger abduction and long finger flexion. There is intact sensation to light touch in all dermatomal and peripheral nerve distributions.  There is a negative Tinel's test at the bilateral wrist and elbow. There is a negative Phalen's test bilaterally. There is a negative Hoffmann's test bilaterally.  Skin:    General: Skin is warm and dry.     Findings: No erythema or rash.  Neurological:     General: No focal deficit present.     Mental Status: She is alert and oriented to person, place, and time.     Cranial Nerves: No cranial nerve deficit.     Sensory: No sensory deficit.     Motor: No weakness or abnormal muscle tone.     Coordination: Coordination normal.     Gait: Gait normal.  Psychiatric:        Mood and Affect: Mood normal.        Behavior: Behavior normal.      Imaging: No results found.

## 2022-09-23 NOTE — Patient Instructions (Signed)
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
## 2022-09-23 NOTE — Assessment & Plan Note (Signed)
Gi cocktail given with good results  Con't protonix 2x a day  Pepcid bid  Refer to GI

## 2022-09-27 ENCOUNTER — Other Ambulatory Visit: Payer: Self-pay | Admitting: Family Medicine

## 2022-09-27 ENCOUNTER — Other Ambulatory Visit: Payer: Self-pay | Admitting: Cardiology

## 2022-09-29 ENCOUNTER — Other Ambulatory Visit: Payer: Self-pay | Admitting: Physical Medicine and Rehabilitation

## 2022-09-29 DIAGNOSIS — M5416 Radiculopathy, lumbar region: Secondary | ICD-10-CM

## 2022-09-29 NOTE — Progress Notes (Signed)
Spoke with patient this morning regarding recent lumbar MRI imaging. 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 There is  moderate spinal canal stenosis at L3-L4. I placed order for left L5-S1 interlaminar epidural steroid injection, we will need permission for her to come off Plavix for injection.

## 2022-09-30 ENCOUNTER — Ambulatory Visit (INDEPENDENT_AMBULATORY_CARE_PROVIDER_SITE_OTHER): Payer: 59 | Admitting: Orthopaedic Surgery

## 2022-09-30 ENCOUNTER — Encounter: Payer: Self-pay | Admitting: Orthopaedic Surgery

## 2022-09-30 DIAGNOSIS — M9903 Segmental and somatic dysfunction of lumbar region: Secondary | ICD-10-CM | POA: Diagnosis not present

## 2022-09-30 DIAGNOSIS — G5603 Carpal tunnel syndrome, bilateral upper limbs: Secondary | ICD-10-CM

## 2022-09-30 DIAGNOSIS — G5602 Carpal tunnel syndrome, left upper limb: Secondary | ICD-10-CM | POA: Diagnosis not present

## 2022-09-30 DIAGNOSIS — G5601 Carpal tunnel syndrome, right upper limb: Secondary | ICD-10-CM

## 2022-09-30 DIAGNOSIS — M9902 Segmental and somatic dysfunction of thoracic region: Secondary | ICD-10-CM | POA: Diagnosis not present

## 2022-09-30 MED ORDER — BUPIVACAINE HCL 0.5 % IJ SOLN
1.0000 mL | INTRAMUSCULAR | Status: AC | PRN
Start: 2022-09-30 — End: 2022-09-30
  Administered 2022-09-30: 1 mL

## 2022-09-30 MED ORDER — METHYLPREDNISOLONE ACETATE 40 MG/ML IJ SUSP
40.0000 mg | INTRAMUSCULAR | Status: AC | PRN
Start: 2022-09-30 — End: 2022-09-30
  Administered 2022-09-30: 40 mg

## 2022-09-30 MED ORDER — LIDOCAINE HCL 1 % IJ SOLN
1.0000 mL | INTRAMUSCULAR | Status: AC | PRN
Start: 2022-09-30 — End: 2022-09-30
  Administered 2022-09-30: 1 mL
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
## 2022-10-03 ENCOUNTER — Encounter: Payer: Self-pay | Admitting: Family Medicine

## 2022-10-06 ENCOUNTER — Other Ambulatory Visit: Payer: Self-pay | Admitting: Physical Medicine and Rehabilitation

## 2022-10-06 MED ORDER — HYDROCODONE-ACETAMINOPHEN 5-325 MG PO TABS: 1.0000 | ORAL_TABLET | Freq: Three times a day (TID) | ORAL | 0 refills | Status: DC | PRN

## 2022-10-07 ENCOUNTER — Ambulatory Visit: Payer: Self-pay | Admitting: Licensed Clinical Social Worker

## 2022-10-07 NOTE — Patient Instructions (Signed)
Social Work Visit Information  Thank you for taking time to visit with me today. Please don't hesitate to contact me if I can be of assistance to you.   Following are the goals we discussed today:   Goals Addressed             This Visit's Progress    Manage mental health and connect for therapy       Activities and task to complete in order to accomplish goals.   Follow up with [facility] to schedule your therapy appointment Keep all upcoming appointment discussed today Continue with compliance of taking medication prescribed by Doctor Continue working on self care and focusing on your heath ) Complete the advance directive document and provide a copy for your chart. I have scheduled you with a phone appointment with Donn Pierini the RN Care Manager         Our next appointment is by telephone on 10/28/22 at 3:30   Please call the care guide team at [phone] if you need to cancel or reschedule your appointment.   If you or anyone you know are experiencing a Mental Health or Behavioral Health Crisis or need someone to talk to, please call the Suicide and Crisis Lifeline: 988 call the [place] National Suicide Prevention Lifeline: 469-139-4450 or TTY: (507)881-1042 TTY 213-685-8494) to talk to a trained counselor call 1-800-273-TALK (toll free, 24 hour hotline) go to [facility] [address] [place] [phone])   Patient verbalizes understanding of instructions and care plan provided today and agrees to view in MyChart. Active MyChart status and patient understanding of how to access instructions and care plan via MyChart confirmed with patient.       Sammuel Hines, LCSW Social Work Care Coordination  [facility] [employer] [phone]

## 2022-10-07 NOTE — Patient Outreach (Signed)
  Care Coordination  Follow Up Visit Note   10/07/2022 Name: RUBICELA FERO MRN: [id number] DOB: [birth year]  [name] is a 67 y.o. year old female who sees Zola Button, Grayling Congress, DO for primary care. I spoke with  [name] by phone today.  What matters to the patients health and wellness today?    Patient is making progress with goals, she has connected with [facility] and waiting for return call to schedule her initial appointment. She will also work on her advance directive packet that will be mailed to her.   Goals Addressed             This Visit's Progress    Manage mental health and connect for therapy       Activities and task to complete in order to accomplish goals.   Follow up with [facility] to schedule your therapy appointment Keep all upcoming appointment discussed today Continue with compliance of taking medication prescribed by Doctor Continue working on self care and focusing on your heath ) Complete the advance directive document and provide a copy for your chart. I have scheduled you with a phone appointment with Donn Pierini the RN Care Manager         SDOH assessments and interventions completed:  No   Care Coordination Interventions:  Yes, provided  Interventions Today    Flowsheet Row Most Recent Value  Chronic Disease   Chronic disease during today's visit Congestive Heart Failure (CHF), Chronic Obstructive Pulmonary Disease (COPD), Chronic Kidney Disease/End Stage Renal Disease (ESRD), Diabetes  General Interventions   General Interventions Discussed/Reviewed General Interventions Reviewed, Referral to Nurse  Education Interventions   Education Provided Provided Education  Provided Verbal Education On Mental Health/Coping with Illness  Mental Health Interventions   Mental Health Discussed/Reviewed Mental Health Reviewed  [connecting for ongoing therapy]  Advanced Directive Interventions   Advanced Directives  Discussed/Reviewed Advanced Directives Discussed, Provided resource for acquiring and filling out documents       Follow up plan: Follow up call scheduled for 09/27/22    Encounter Outcome:  Pt. Visit Completed   Sammuel Hines, LCSW Social Work Care Coordination  [facility] [employer] [phone]

## 2022-10-09 ENCOUNTER — Encounter: Payer: Self-pay | Admitting: Cardiology

## 2022-10-09 ENCOUNTER — Other Ambulatory Visit: Payer: Self-pay | Admitting: Family

## 2022-10-09 DIAGNOSIS — M722 Plantar fascial fibromatosis: Secondary | ICD-10-CM | POA: Diagnosis not present

## 2022-10-09 DIAGNOSIS — M2042 Other hammer toe(s) (acquired), left foot: Secondary | ICD-10-CM | POA: Diagnosis not present

## 2022-10-09 DIAGNOSIS — E1165 Type 2 diabetes mellitus with hyperglycemia: Secondary | ICD-10-CM | POA: Diagnosis not present

## 2022-10-21 ENCOUNTER — Telehealth: Payer: Self-pay

## 2022-10-21 ENCOUNTER — Ambulatory Visit: Payer: Self-pay

## 2022-10-21 NOTE — Patient Outreach (Signed)
  Care Coordination   10/21/2022 Name: Katrina Vega MRN: [id number] DOB: 05-27-1955   Care Coordination Outreach Attempts:  An unsuccessful telephone outreach was attempted for a scheduled appointment today.  Follow Up Plan:  Additional outreach attempts will be made to offer the patient care coordination information and services.   Encounter Outcome:  No Answer   Care Coordination Interventions:  No, not indicated    Kathyrn Sheriff, RN, MSN, BSN, CCM Care Management Coordinator [phone]

## 2022-10-21 NOTE — Patient Outreach (Signed)
  Care Coordination   Follow Up Visit Note   10/21/2022 Name: Katrina Vega MRN: [id number] DOB: 07-26-1955  Katrina Vega is a 67 y.o. year old female who sees Zola Button, Grayling Congress, DO for primary care. I spoke with  Katrina Vega by phone today.  What matters to the patients health and wellness today?  Ms. Servin reports she is improved. She continues to work part time. She states she still needs to make an appointment with gastroenterologist, but is following appointments as scheduled. She is without questions or concerns today.  Goals Addressed             This Visit's Progress    Health Management education       Interventions Today    Flowsheet Row Most Recent Value  Chronic Disease   Chronic disease during today's visit Congestive Heart Failure (CHF), Chronic Obstructive Pulmonary Disease (COPD), Diabetes, Chronic Kidney Disease/End Stage Renal Disease (ESRD), Hypertension (HTN)  General Interventions   General Interventions Discussed/Reviewed General Interventions Reviewed, Doctor Visits  Doctor Visits Discussed/Reviewed Doctor Visits Reviewed, Doctor Visits Discussed, Specialist  PCP/Specialist Visits Compliance with follow-up visit  [reports nephrology appointment on 01/01/23]  Education Interventions   Provided Verbal Education On Blood Sugar Monitoring, Medication, Exercise, Nutrition  [Advised to continue to eat healthy remain as active as tolerated,  attend provider appointments as recommended,  advised to contact gastroenterologist to schedule appointment.]  Pharmacy Interventions   Pharmacy Dicussed/Reviewed Pharmacy Topics Reviewed, Medications and their functions, Medication Adherence, Affording Medications            SDOH assessments and interventions completed:  Yes  Care Coordination Interventions:  Yes, provided   Follow up plan: Follow up call scheduled for 11/19/22    Encounter Outcome:  Pt. Visit Completed   Kathyrn Sheriff, RN, MSN, BSN, CCM Care  Management Coordinator [phone]

## 2022-10-21 NOTE — Patient Instructions (Signed)
Visit Information  Thank you for taking time to visit with me today. Please don't hesitate to contact me if I can be of assistance to you.   Following are the goals we discussed today:   Goals Addressed             This Visit's Progress    Health Management education       Interventions Today    Flowsheet Row Most Recent Value  Chronic Disease   Chronic disease during today's visit Congestive Heart Failure (CHF), Chronic Obstructive Pulmonary Disease (COPD), Diabetes, Chronic Kidney Disease/End Stage Renal Disease (ESRD), Hypertension (HTN)  General Interventions   General Interventions Discussed/Reviewed General Interventions Reviewed, Doctor Visits  Doctor Visits Discussed/Reviewed Doctor Visits Reviewed, Doctor Visits Discussed, Specialist  PCP/Specialist Visits Compliance with follow-up visit  [reports nephrology appointment on 01/01/23]  Education Interventions   Provided Verbal Education On Blood Sugar Monitoring, Medication, Exercise, Nutrition  [Advised to continue to eat healthy remain as active as tolerated,  attend provider appointments as recommended,  advised to contact gastroenterologist to schedule appointment.]  Pharmacy Interventions   Pharmacy Dicussed/Reviewed Pharmacy Topics Reviewed, Medications and their functions, Medication Adherence, Affording Medications            Our next appointment is by telephone on 11/19/22 at 2:30 pm  Please call the care guide team at [phone] if you need to cancel or reschedule your appointment.   If you are experiencing a Mental Health or Behavioral Health Crisis or need someone to talk to, please call the Suicide and Crisis Lifeline: 988 call the [place] National Suicide Prevention Lifeline: 409-099-4051 or TTY: 586-581-3127 TTY (865) 199-5035) to talk to a trained counselor call 1-800-273-TALK (toll free, 24 hour hotline)  Kathyrn Sheriff, RN, MSN, BSN, CCM Care Management Coordinator [phone]

## 2022-10-23 ENCOUNTER — Other Ambulatory Visit: Payer: Self-pay

## 2022-10-23 ENCOUNTER — Ambulatory Visit: Payer: 59 | Admitting: Physical Medicine and Rehabilitation

## 2022-10-23 DIAGNOSIS — M5416 Radiculopathy, lumbar region: Secondary | ICD-10-CM | POA: Diagnosis not present

## 2022-10-23 MED ORDER — METHYLPREDNISOLONE ACETATE 80 MG/ML IJ SUSP
80.0000 mg | Freq: Once | INTRAMUSCULAR | Status: AC
  Administered 2022-10-23: 80 mg

## 2022-10-23 NOTE — Progress Notes (Signed)
Functional Pain Scale - descriptive words and definitions  Distracting (5)    Aware of pain/able to complete some ADL's but limited by pain/sleep is affected and active distractions are only slightly useful. Moderate range order  Average Pain 10  Her pain isn't bad until she starts moving around.  She stopped BT on the 21st  +Driver, -BT, -Dye Allergies.

## 2022-10-23 NOTE — Patient Instructions (Signed)

## 2022-10-25 NOTE — Procedures (Signed)
Lumbar Epidural Steroid Injection - Interlaminar Approach with Fluoroscopic Guidance  Patient: Katrina Vega      Date of Birth: 1955-04-19 MRN: [id number] PCP: Donato Schultz, DO      Visit Date: 10/23/2022   Universal Protocol:     Consent Given By: the patient  Position: PRONE  Additional Comments: Vital signs were monitored before and after the procedure. Patient was prepped and draped in the usual sterile fashion. The correct patient, procedure, and site was verified.   Injection Procedure Details:   Procedure diagnoses: Lumbar radiculopathy [M54.16]   Meds Administered:  Meds ordered this encounter  Medications   methylPREDNISolone acetate (DEPO-MEDROL) injection 80 mg     Laterality: Left  Location/Site:  L5-S1  Needle: 4.5 in., 20 ga. Tuohy  Needle Placement: Paramedian epidural  Findings:   -Comments: Excellent flow of contrast into the epidural space.  Procedure Details: Using a paramedian approach from the side mentioned above, the region overlying the inferior lamina was localized under fluoroscopic visualization and the soft tissues overlying this structure were infiltrated with 4 ml. of 1% Lidocaine without Epinephrine. The Tuohy needle was inserted into the epidural space using a paramedian approach.   The epidural space was localized using loss of resistance along with counter oblique bi-planar fluoroscopic views.  After negative aspirate for air, blood, and CSF, a 2 ml. volume of Isovue-250 was injected into the epidural space and the flow of contrast was observed. Radiographs were obtained for documentation purposes.    The injectate was administered into the level noted above.   Additional Comments:  No complications occurred Dressing: 2 x 2 sterile gauze and Band-Aid    Post-procedure details: Patient was observed during the procedure. Post-procedure instructions were reviewed.  Patient left the clinic in stable condition.

## 2022-10-25 NOTE — Progress Notes (Signed)
Katrina Vega - 67 y.o. female MRN [id number]  Date of birth: Feb 08, 1956  Office Visit Note: Visit Date: 10/23/2022 PCP: Donato Schultz, DO Referred by: Donato Schultz, *  Subjective: Chief Complaint  Patient presents with   Lower Back - Pain   HPI:  Katrina Vega is a 67 y.o. female who comes in today at the request of Ellin Goodie, FNP for planned Left L5-S1 Lumbar Interlaminar epidural steroid injection with fluoroscopic guidance.  The patient has failed conservative care including home exercise, medications, time and activity modification.  This injection will be diagnostic and hopefully therapeutic.  Please see requesting physician notes for further details and justification. 2016 contrast dye with SOB but no rash or anaphylaxis. Small amount used.   ROS Otherwise per HPI.  Assessment & Plan: Visit Diagnoses:    ICD-10-CM   1. Lumbar radiculopathy  M54.16 XR C-ARM NO REPORT    Epidural Steroid injection    methylPREDNISolone acetate (DEPO-MEDROL) injection 80 mg      Plan: No additional findings.   Meds & Orders:  Meds ordered this encounter  Medications   methylPREDNISolone acetate (DEPO-MEDROL) injection 80 mg    Orders Placed This Encounter  Procedures   XR C-ARM NO REPORT   Epidural Steroid injection    Follow-up: Return for visit to requesting provider as needed.   Procedures: No procedures performed  Lumbar Epidural Steroid Injection - Interlaminar Approach with Fluoroscopic Guidance  Patient: Katrina Vega      Date of Birth: 02/12/1956 MRN: [id number] PCP: Donato Schultz, DO      Visit Date: 10/23/2022   Universal Protocol:     Consent Given By: the patient  Position: PRONE  Additional Comments: Vital signs were monitored before and after the procedure. Patient was prepped and draped in the usual sterile fashion. The correct patient, procedure, and site was verified.   Injection Procedure Details:   Procedure  diagnoses: Lumbar radiculopathy [M54.16]   Meds Administered:  Meds ordered this encounter  Medications   methylPREDNISolone acetate (DEPO-MEDROL) injection 80 mg     Laterality: Left  Location/Site:  L5-S1  Needle: 4.5 in., 20 ga. Tuohy  Needle Placement: Paramedian epidural  Findings:   -Comments: Excellent flow of contrast into the epidural space.  Procedure Details: Using a paramedian approach from the side mentioned above, the region overlying the inferior lamina was localized under fluoroscopic visualization and the soft tissues overlying this structure were infiltrated with 4 ml. of 1% Lidocaine without Epinephrine. The Tuohy needle was inserted into the epidural space using a paramedian approach.   The epidural space was localized using loss of resistance along with counter oblique bi-planar fluoroscopic views.  After negative aspirate for air, blood, and CSF, a 2 ml. volume of Isovue-250 was injected into the epidural space and the flow of contrast was observed. Radiographs were obtained for documentation purposes.    The injectate was administered into the level noted above.   Additional Comments:  No complications occurred Dressing: 2 x 2 sterile gauze and Band-Aid    Post-procedure details: Patient was observed during the procedure. Post-procedure instructions were reviewed.  Patient left the clinic in stable condition.   Clinical History: CLINICAL DATA:  Low back pain, symptoms persist with > 6 wks treatment   EXAM: MRI LUMBAR SPINE WITHOUT CONTRAST   TECHNIQUE: Multiplanar, multisequence MR imaging of the lumbar spine was performed. No intravenous contrast was administered.   COMPARISON:  MR L  Spine 09/11/20   FINDINGS: Segmentation:  Standard.   Alignment:  Trace stepwise anterolisthesis of L3 on L4 and L4 on L5.   Vertebrae: No fracture, evidence of discitis, or bone lesion. Mild T2/stir hyperintense signal abnormality of the superior endplate  of L2 is favored to be degenerative.   Conus medullaris and cauda equina: Conus extends to the L1-L2 disc space level. Conus and cauda equina appear normal.   Paraspinal and other soft tissues: Negative.   Disc levels:   T12-L1: Mild bilateral facet degenerative change. Minimal disc bulge. No spinal canal narrowing. No neural foraminal narrowing.   L1-L2: Mild bilateral facet degenerative change. No significant disc bulge. No spinal canal narrowing. Mild right neural foraminal narrowing.   L2-L3: Mild bilateral facet degenerative change. Minimal disc bulge. Mild spinal canal narrowing. Moderate bilateral neural foraminal narrowing.   L3-L4: Moderate to severe bilateral facet degenerative change. Circumferential disc bulge. Moderate spinal canal narrowing. Moderate bilateral neural foraminal narrowing.   L4-L5: Moderate to severe bilateral facet degenerative change. Minimal disc bulge. Mild spinal canal narrowing. Moderate bilateral neural foraminal narrowing.   L5-S1: Severe left and moderate right facet degenerative change. Minimal disc bulge. No spinal canal narrowing. Moderate right and mild left neural foraminal narrowing. There is a 4 x 4 mm intraspinal synovial cyst arising from the right-sided facet joint.   IMPRESSION: Multilevel degenerative changes of the lumbar spine, most pronounced at L3-L4 where there is moderate spinal canal narrowing and moderate bilateral neural foraminal narrowing.     Electronically Signed   By: Lorenza Cambridge M.D.   On: 09/27/2022 20:05     Objective:  VS:  HT:    WT:   BMI:     BP:   HR: bpm  TEMP: ( )  RESP:  Physical Exam Vitals and nursing note reviewed.  Constitutional:      General: She is not in acute distress.    Appearance: Normal appearance. She is not ill-appearing.  HENT:     Head: Normocephalic and atraumatic.     Right Ear: External ear normal.     Left Ear: External ear normal.  Eyes:     Extraocular  Movements: Extraocular movements intact.  Cardiovascular:     Rate and Rhythm: Normal rate.     Pulses: Normal pulses.  Pulmonary:     Effort: Pulmonary effort is normal. No respiratory distress.  Abdominal:     General: There is no distension.     Palpations: Abdomen is soft.  Musculoskeletal:        General: Tenderness present.     Cervical back: Neck supple.     Right lower leg: No edema.     Left lower leg: No edema.     Comments: Patient has good distal strength with no pain over the greater trochanters.  No clonus or focal weakness.  Skin:    Findings: No erythema, lesion or rash.  Neurological:     General: No focal deficit present.     Mental Status: She is alert and oriented to person, place, and time.     Sensory: No sensory deficit.     Motor: No weakness or abnormal muscle tone.     Coordination: Coordination normal.  Psychiatric:        Mood and Affect: Mood normal.        Behavior: Behavior normal.      Imaging: No results found.

## 2022-10-28 ENCOUNTER — Telehealth: Payer: Self-pay | Admitting: Licensed Clinical Social Worker

## 2022-10-28 ENCOUNTER — Encounter: Payer: Self-pay | Admitting: Licensed Clinical Social Worker

## 2022-10-28 NOTE — Patient Instructions (Signed)
  I am sorry you were unable to keep your phone appointment today.   Please call me to reschedule  Deborah Moore, LCSW Social Work Care Coordination  [phone]  

## 2022-10-28 NOTE — Patient Outreach (Signed)
  Care Coordination   10/28/2022 Name: Katrina Vega MRN: [id number] DOB: 1955-06-09   Care Coordination Outreach Attempts:  An unsuccessful telephone outreach was attempted for a scheduled appointment today.  Follow Up Plan:  No further outreach attempts will be made at this time. We have been unable to contact the patient. Today was the last scheduled call.  Left voice message to call LCSW if she would like to reschedule.  Patient has f/u appointment with RN, will inform RN with additional support is needed with social work.  Encounter Outcome:  No Answer   Care Coordination Interventions:  No, not indicated    Sammuel Hines, LCSW [place]  [facility] Licensed Clinical Social Work Care Coordinator  Direct Dial: [phone]

## 2022-11-03 ENCOUNTER — Ambulatory Visit (INDEPENDENT_AMBULATORY_CARE_PROVIDER_SITE_OTHER): Payer: 59

## 2022-11-03 DIAGNOSIS — J4550 Severe persistent asthma, uncomplicated: Secondary | ICD-10-CM | POA: Diagnosis not present

## 2022-11-04 DIAGNOSIS — Z794 Long term (current) use of insulin: Secondary | ICD-10-CM | POA: Diagnosis not present

## 2022-11-04 DIAGNOSIS — E1165 Type 2 diabetes mellitus with hyperglycemia: Secondary | ICD-10-CM | POA: Diagnosis not present

## 2022-11-04 LAB — HEMOGLOBIN A1C: Hemoglobin A1C: 7.5

## 2022-11-05 ENCOUNTER — Encounter: Payer: Self-pay | Admitting: Cardiology

## 2022-11-05 DIAGNOSIS — N1831 Chronic kidney disease, stage 3a: Secondary | ICD-10-CM | POA: Diagnosis not present

## 2022-11-05 DIAGNOSIS — E1122 Type 2 diabetes mellitus with diabetic chronic kidney disease: Secondary | ICD-10-CM | POA: Diagnosis not present

## 2022-11-05 NOTE — Telephone Encounter (Signed)
Call to patient from My chart message Patient states leg right below buttock painful.  It now is groin to foot.  Right Foot hot and slightly swollen.  States the right leg hurts"so bad" she can barely get up without tears in her eyes. States she does not feel warmth in the foot now, slightly red.  More pain in the leg. Pain in the buttock to thigh started last Friday and then the leg to foot more today. She discusses how painful it is. Difficult to walk when she gets up, she can't get up right away. Unsure if it is due to stents or if another issue.  She has contacted otrho with no results.  She is set for appt tomorrow morning with DOD.  She is advised if pain increases or swelling with hot to touch, she can go to ED.  Also if pain continues and/or the color to leg changes to pale then she needs to go to ED.  She states understanding.

## 2022-11-06 ENCOUNTER — Telehealth: Payer: Self-pay | Admitting: Physical Medicine and Rehabilitation

## 2022-11-06 ENCOUNTER — Ambulatory Visit: Payer: 59 | Attending: Internal Medicine | Admitting: Internal Medicine

## 2022-11-06 ENCOUNTER — Encounter: Payer: Self-pay | Admitting: Internal Medicine

## 2022-11-06 VITALS — BP 126/76 | HR 72 | Ht 65.0 in | Wt 241.6 lb

## 2022-11-06 DIAGNOSIS — I739 Peripheral vascular disease, unspecified: Secondary | ICD-10-CM

## 2022-11-06 MED ORDER — LOSARTAN POTASSIUM 25 MG PO TABS: 25.0000 mg | ORAL_TABLET | Freq: Every day | ORAL | 2 refills | Status: DC

## 2022-11-06 NOTE — Patient Instructions (Signed)
Medication Instructions:   STOP TAKING LISINOPRIL NOW  START TAKING LOSARTAN 25 MG BY MOUTH DAILY  *If you need a refill on your cardiac medications before your next appointment, please call your pharmacy*    Follow-Up: At [facility], you and your health needs are our priority.  As part of our continuing mission to provide you with exceptional heart care, we have created designated Provider Care Teams.  These Care Teams include your primary Cardiologist (physician) and Advanced Practice Providers (APPs -  Physician Assistants and Nurse Practitioners) who all work together to provide you with the care you need, when you need it.  We recommend signing up for the patient portal called "MyChart".  Sign up information is provided on this After Visit Summary.  MyChart is used to connect with patients for Virtual Visits (Telemedicine).  Patients are able to view lab/test results, encounter notes, upcoming appointments, etc.  Non-urgent messages can be sent to your provider as well.   To learn more about what you can do with MyChart, go to [email or web address].    Your next appointment:   6 month(s)  Provider:   Bryan Lemma, MD

## 2022-11-06 NOTE — Telephone Encounter (Signed)
Patient called needing to schedule an appointment with Dr. Alvester Morin. Patient said the injection is not working. The number to contact patient is [phone]

## 2022-11-06 NOTE — Progress Notes (Signed)
  Cardiology Office Note:  .   Date:  11/06/2022  ID:  Katrina Vega, DOB October 30, 1955, MRN [id number] PCP: Zola Button, Grayling Congress, DO  [facility] Providers Cardiologist:  Bryan Lemma, MD    History of Present Illness: .   Katrina Vega is a 67 y.o. female hx of PAD s/p R Post Tib DES, Bilat SFA stents , T2DM, HTN,  HFpEF here for follow up. She saw Dr. Herbie Baltimore in the past and was managing PAD and HFpEF. She's been on bumex. He last saw her in January of this year.  She comes in today and states she has shooting pain down her R thigh down to her calf. She notes when she sits down it shoots down. She was concerned and called cardiology and orthopedics. Otherwise, She denies angina, dyspnea on exertion, lower extremity edema. She does not report PND or orthopnea.   She also reports a recent cough and was told lisinopril may contribute.  ROS:  per HPI otherwise negative   Studies Reviewed: Marland Kitchen       Myoview in May of 2021 did not show ischemia  Risk Assessment/Calculations:    Physical Exam:   VS:   Vitals:   11/06/22 0835  BP: 126/76  Pulse: 72  SpO2: 96%     Wt Readings from Last 3 Encounters:  09/23/22 240 lb 9.6 oz (109.1 kg)  09/04/22 245 lb (111.1 kg)  08/20/22 240 lb 1.6 oz (108.9 kg)    GEN: Well nourished, well developed in no acute distress NECK: No JVD; No carotid bruits CARDIAC: RRR, no murmurs, rubs, gallops RESPIRATORY:  Clear to auscultation without rales, wheezing or rhonchi  VASC: palpable DP pulses BL Skin: warm and well perfused ABDOMEN: Soft, non-tender, non-distended EXTREMITIES:  No edema; No deformity   ASSESSMENT AND PLAN: .   Sciatica -discussed this is likely related to lumbar degenerative disc dx  Stable chronic dx PAD -LDL 54 mg/dL - continue crestor 40 mg daily and plavix 75 mg daily - no claudication   HFpEF - euvolemic - continue bumex 1 mg daily or as needed (notes has not taken recently)  T2DM - 7.7%  - continue  jardiance 10 mg daily and crestor  HTN - well controlled - change lisinopril to losartan 25 mg daily; otherwise  continue current regimen.        Dispo: Follow up with Dr. Herbie Baltimore  Signed, Pamelia Botto, Alben Spittle, MD

## 2022-11-07 DIAGNOSIS — E278 Other specified disorders of adrenal gland: Secondary | ICD-10-CM | POA: Diagnosis not present

## 2022-11-07 NOTE — Telephone Encounter (Signed)
Patient called in and she is having pain in right leg and cannot put pressure on it. Cardiologist thinks it is the disc when she saw them earlier in the week. She is having trouble with the pain and states this pain is different. I did schedule her for an OV on 11/10/22. Please advise

## 2022-11-10 ENCOUNTER — Ambulatory Visit (INDEPENDENT_AMBULATORY_CARE_PROVIDER_SITE_OTHER): Payer: 59 | Admitting: Physical Medicine and Rehabilitation

## 2022-11-10 ENCOUNTER — Encounter: Payer: Self-pay | Admitting: Physical Medicine and Rehabilitation

## 2022-11-10 ENCOUNTER — Other Ambulatory Visit (INDEPENDENT_AMBULATORY_CARE_PROVIDER_SITE_OTHER): Payer: 59

## 2022-11-10 DIAGNOSIS — M7918 Myalgia, other site: Secondary | ICD-10-CM

## 2022-11-10 DIAGNOSIS — R1031 Right lower quadrant pain: Secondary | ICD-10-CM | POA: Diagnosis not present

## 2022-11-10 DIAGNOSIS — M5416 Radiculopathy, lumbar region: Secondary | ICD-10-CM | POA: Diagnosis not present

## 2022-11-10 DIAGNOSIS — M25551 Pain in right hip: Secondary | ICD-10-CM

## 2022-11-10 NOTE — Progress Notes (Unsigned)
More hip pain than anything..  She had back injections and it didn't even last 2 weeks.   Pain last thurs in behind her leg. And the pain is getting worse.  She is unstable and has falling a few times.
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
## 2022-11-11 ENCOUNTER — Ambulatory Visit: Payer: 59 | Admitting: Orthopaedic Surgery

## 2022-11-13 ENCOUNTER — Other Ambulatory Visit: Payer: Self-pay

## 2022-11-13 ENCOUNTER — Ambulatory Visit (INDEPENDENT_AMBULATORY_CARE_PROVIDER_SITE_OTHER): Payer: 59 | Admitting: Physical Medicine and Rehabilitation

## 2022-11-13 DIAGNOSIS — M25551 Pain in right hip: Secondary | ICD-10-CM

## 2022-11-13 NOTE — Progress Notes (Signed)
Here for scheduled right hip injection

## 2022-11-13 NOTE — Progress Notes (Signed)
Katrina Vega - 67 y.o. female MRN [id number]  Date of birth: May 06, 1955  Office Visit Note: Visit Date: 11/13/2022 PCP: Donato Schultz, DO Referred by: Donato Schultz, *  Subjective: Chief Complaint  Patient presents with   Right Hip - Pain   HPI:  Katrina Vega is a 67 y.o. female who comes in today at the request of Ellin Goodie, FNP for planned Right anesthetic hip arthrogram with fluoroscopic guidance.  The patient has failed conservative care including home exercise, medications, time and activity modification.  This injection will be diagnostic and hopefully therapeutic.  Please see requesting physician notes for further details and justification.   ROS Otherwise per HPI.  Assessment & Plan: Visit Diagnoses:    ICD-10-CM   1. Pain in right hip  M25.551 Large Joint Inj: R hip joint    XR C-ARM NO REPORT      Plan: No additional findings.   Meds & Orders: No orders of the defined types were placed in this encounter.   Orders Placed This Encounter  Procedures   Large Joint Inj: R hip joint   XR C-ARM NO REPORT    Follow-up: Return for visit to requesting provider as needed.   Procedures: Large Joint Inj: R hip joint on 11/13/2022 10:02 AM Indications: diagnostic evaluation and pain Details: 22 G 3.5 in needle, fluoroscopy-guided anterior approach  Arthrogram: No  Medications: 4 mL bupivacaine 0.25 %; 40 mg triamcinolone acetonide 40 MG/ML Outcome: tolerated well, no immediate complications  There was excellent flow of contrast producing a partial arthrogram of the hip. The patient did have some relief of symptoms during the anesthetic phase of the injection. Procedure, treatment alternatives, risks and benefits explained, specific risks discussed. Consent was given by the patient. Immediately prior to procedure a time out was called to verify the correct patient, procedure, equipment, support staff and site/side marked as required. Patient was  prepped and draped in the usual sterile fashion.          Clinical History: CLINICAL DATA:  Low back pain, symptoms persist with > 6 wks treatment   EXAM: MRI LUMBAR SPINE WITHOUT CONTRAST   TECHNIQUE: Multiplanar, multisequence MR imaging of the lumbar spine was performed. No intravenous contrast was administered.   COMPARISON:  MR L Spine 09/11/20   FINDINGS: Segmentation:  Standard.   Alignment:  Trace stepwise anterolisthesis of L3 on L4 and L4 on L5.   Vertebrae: No fracture, evidence of discitis, or bone lesion. Mild T2/stir hyperintense signal abnormality of the superior endplate of L2 is favored to be degenerative.   Conus medullaris and cauda equina: Conus extends to the L1-L2 disc space level. Conus and cauda equina appear normal.   Paraspinal and other soft tissues: Negative.   Disc levels:   T12-L1: Mild bilateral facet degenerative change. Minimal disc bulge. No spinal canal narrowing. No neural foraminal narrowing.   L1-L2: Mild bilateral facet degenerative change. No significant disc bulge. No spinal canal narrowing. Mild right neural foraminal narrowing.   L2-L3: Mild bilateral facet degenerative change. Minimal disc bulge. Mild spinal canal narrowing. Moderate bilateral neural foraminal narrowing.   L3-L4: Moderate to severe bilateral facet degenerative change. Circumferential disc bulge. Moderate spinal canal narrowing. Moderate bilateral neural foraminal narrowing.   L4-L5: Moderate to severe bilateral facet degenerative change. Minimal disc bulge. Mild spinal canal narrowing. Moderate bilateral neural foraminal narrowing.   L5-S1: Severe left and moderate right facet degenerative change. Minimal disc bulge. No spinal  canal narrowing. Moderate right and mild left neural foraminal narrowing. There is a 4 x 4 mm intraspinal synovial cyst arising from the right-sided facet joint.   IMPRESSION: Multilevel degenerative changes of the lumbar  spine, most pronounced at L3-L4 where there is moderate spinal canal narrowing and moderate bilateral neural foraminal narrowing.     Electronically Signed   By: Lorenza Cambridge M.D.   On: 09/27/2022 20:05     Objective:  VS:  HT:    WT:   BMI:     BP:   HR: bpm  TEMP: ( )  RESP:  Physical Exam   Imaging: XR Wrist Complete Right  Result Date: 11/17/2022 X-rays of the right wrist, multiple views were completed today X-rays demonstrate stable appearance of the radiocarpal and midcarpal articulations without significant abnormalities.  There is mild widening at the scapholunate interval without significant dissociation or DISI deformity.

## 2022-11-17 ENCOUNTER — Ambulatory Visit (INDEPENDENT_AMBULATORY_CARE_PROVIDER_SITE_OTHER): Payer: 59 | Admitting: Orthopedic Surgery

## 2022-11-17 ENCOUNTER — Other Ambulatory Visit (INDEPENDENT_AMBULATORY_CARE_PROVIDER_SITE_OTHER): Payer: 59

## 2022-11-17 DIAGNOSIS — M25531 Pain in right wrist: Secondary | ICD-10-CM

## 2022-11-17 DIAGNOSIS — Z9889 Other specified postprocedural states: Secondary | ICD-10-CM

## 2022-11-17 MED ORDER — TRIAMCINOLONE ACETONIDE 40 MG/ML IJ SUSP
40.0000 mg | INTRAMUSCULAR | Status: AC | PRN
Start: 2022-11-13 — End: 2022-11-13
  Administered 2022-11-13: 40 mg via INTRA_ARTICULAR

## 2022-11-17 MED ORDER — BUPIVACAINE HCL 0.25 % IJ SOLN
4.0000 mL | INTRAMUSCULAR | Status: AC | PRN
Start: 2022-11-13 — End: 2022-11-13
  Administered 2022-11-13: 4 mL via INTRA_ARTICULAR
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
## 2022-11-18 ENCOUNTER — Telehealth: Payer: Self-pay | Admitting: *Deleted

## 2022-11-18 ENCOUNTER — Telehealth: Payer: Self-pay

## 2022-11-18 NOTE — Telephone Encounter (Signed)
Pre-operative Risk Assessment    Patient Name: Katrina Vega  DOB: 1955-10-20 MRN: [id number]    LAST APPT:  912/2024 NEXT APPT:  05/18/23  Request for Surgical Clearance    Procedure:   RIGHT REVISION CARPAL TUNNEL RELEASE  Date of Surgery:  Clearance TBD                                 Surgeon:  Samuella Cota, MD Surgeon's Group or Practice Name:  [facility] Phone number:  [phone] Fax number:  [phone]   Type of Clearance Requested:   - Medical  - Pharmacy:  Hold Clopidogrel (Plavix) X'S 5 DAYS   Type of Anesthesia:  General    Additional requests/questions:    Wilhemina Cash   11/18/2022, 11:16 AM

## 2022-11-18 NOTE — Telephone Encounter (Signed)
Spoke with patient who is agreeable to do a tele visit on 10/16 at 1:40 pm. Consent done, med rec to be completed.

## 2022-11-18 NOTE — Telephone Encounter (Signed)
Name: Katrina Vega  DOB: [birth year]  MRN: [id number]  Primary Cardiologist: Bryan Lemma, MD   Preoperative team, please contact this patient and set up a phone call appointment for further preoperative risk assessment. Please obtain consent and complete medication review. Thank you for your help.  I confirm that guidance regarding antiplatelet and oral anticoagulation therapy has been completed and, if necessary, noted below.  Pt was seen by Dr. Wyline Mood for DOD work-in appt. Needs phone call to make sure medication change is doing well and no cardiac symptoms.  Per Dr. Elissa Hefty last noge, she may hold plavix 5-7 days prior to procedures.    Marcelino Duster, PA 11/18/2022, 3:30 PM [facility]

## 2022-11-18 NOTE — Telephone Encounter (Signed)
  Patient Consent for Virtual Visit        Katrina Vega has provided verbal consent on 11/18/2022 for a virtual visit (video or telephone).   CONSENT FOR VIRTUAL VISIT FOR:  Katrina Vega  By participating in this virtual visit I agree to the following:  I hereby voluntarily request, consent and authorize [facility] and its employed or contracted physicians, physician assistants, nurse practitioners or other licensed health care professionals (the Practitioner), to provide me with telemedicine health care services (the "Services") as deemed necessary by the treating Practitioner. I acknowledge and consent to receive the Services by the Practitioner via telemedicine. I understand that the telemedicine visit will involve communicating with the Practitioner through live audiovisual communication technology and the disclosure of certain medical information by electronic transmission. I acknowledge that I have been given the opportunity to request an in-person assessment or other available alternative prior to the telemedicine visit and am voluntarily participating in the telemedicine visit.  I understand that I have the right to withhold or withdraw my consent to the use of telemedicine in the course of my care at any time, without affecting my right to future care or treatment, and that the Practitioner or I may terminate the telemedicine visit at any time. I understand that I have the right to inspect all information obtained and/or recorded in the course of the telemedicine visit and may receive copies of available information for a reasonable fee.  I understand that some of the potential risks of receiving the Services via telemedicine include:  Delay or interruption in medical evaluation due to technological equipment failure or disruption; Information transmitted may not be sufficient (e.g. poor resolution of images) to allow for appropriate medical decision making by the Practitioner;  and/or  In rare instances, security protocols could fail, causing a breach of personal health information.  Furthermore, I acknowledge that it is my responsibility to provide information about my medical history, conditions and care that is complete and accurate to the best of my ability. I acknowledge that Practitioner's advice, recommendations, and/or decision may be based on factors not within their control, such as incomplete or inaccurate data provided by me or distortions of diagnostic images or specimens that may result from electronic transmissions. I understand that the practice of medicine is not an exact science and that Practitioner makes no warranties or guarantees regarding treatment outcomes. I acknowledge that a copy of this consent can be made available to me via my patient portal [facility] MyChart), or I can request a printed copy by calling the office of [facility].    I understand that my insurance will be billed for this visit.   I have read or had this consent read to me. I understand the contents of this consent, which adequately explains the benefits and risks of the Services being provided via telemedicine.  I have been provided ample opportunity to ask questions regarding this consent and the Services and have had my questions answered to my satisfaction. I give my informed consent for the services to be provided through the use of telemedicine in my medical care

## 2022-11-19 ENCOUNTER — Other Ambulatory Visit: Payer: Self-pay | Admitting: Physical Medicine and Rehabilitation

## 2022-11-19 DIAGNOSIS — G8929 Other chronic pain: Secondary | ICD-10-CM

## 2022-11-19 DIAGNOSIS — M47819 Spondylosis without myelopathy or radiculopathy, site unspecified: Secondary | ICD-10-CM

## 2022-11-19 NOTE — Progress Notes (Signed)
Patient sent in MyChart message regarding recent right hip injection, minimal relief of pain with this procedure. She now reports lower back pain. I will go ahead and place order for bilateral L4-L5 and L5-S1 facet joint injections. We will get her in when approved.

## 2022-11-20 ENCOUNTER — Ambulatory Visit: Payer: Self-pay

## 2022-11-20 NOTE — Patient Outreach (Signed)
Care Coordination   Follow Up Visit Note   11/20/2022 Name: SHAWNEE PENICK MRN: [id number] DOB: [birth year]  [name] is a 67 y.o. year old female who sees Zola Button, Grayling Congress, DO for primary care. I spoke with  [name] by phone today.  What matters to the patients health and wellness today?  Ms. Mohring reports concerning today is carpel tunnel of right hand. She states planned surgery(nerve wrap) planned for November. She reports use of dexcom 6. And states her blood sugars have been "all over the place" due to cortisone injections received for back/hip. She reports her Blood sugar down to 53 today while at work. She states treated with soda. She works part time and states ate after getting home. Denies any concerns at this time.  Goals Addressed             This Visit's Progress    Health Management education       Interventions Today    Flowsheet Row Most Recent Value  Chronic Disease   Chronic disease during today's visit Diabetes  General Interventions   General Interventions Discussed/Reviewed General Interventions Reviewed, Doctor Visits  Doctor Visits Discussed/Reviewed Doctor Visits Reviewed, PCP, Specialist  PCP/Specialist Visits Compliance with follow-up visit  [reviewed upcoming follow up appointments]  Education Interventions   Education Provided Provided Education  [discussed planing ahead what may need to happend post procedure to her hand.]  Provided Verbal Education On Blood Sugar Monitoring, Medication, When to see the doctor, Other  [discussed treatment for low blood sugar,  advised patient to contact provider if continues to have drops in blood sugar. encouraged to continue to monitor BS.]  Nutrition Interventions   Nutrition Discussed/Reviewed Nutrition Discussed  Pharmacy Interventions   Pharmacy Dicussed/Reviewed Pharmacy Topics Reviewed, Referral to Pharmacist  [medications reviewed. polypharmacy-medication reconciliation]            SDOH  assessments and interventions completed:  No  Care Coordination Interventions:  Yes, provided   Follow up plan: Follow up call scheduled for 12/19/22    Encounter Outcome:  Patient Visit Completed   Kathyrn Sheriff, RN, MSN, BSN, CCM Care Management Coordinator [phone]

## 2022-11-20 NOTE — Patient Instructions (Signed)
Visit Information  Thank you for taking time to visit with me today. Please don't hesitate to contact me if I can be of assistance to you.   Following are the goals we discussed today:  Continue to take medications as prescribed. Continue to attend provider visits as scheduled Continue to eat healthy, lean meats, vegetables, fruits, avoid saturated and transfats Continue to check blood sugar as recommended and notify provider if questions or concerns  Our next appointment is by telephone on 12/19/22 at 2:30 pm  Please call the care guide team at [phone] if you need to cancel or reschedule your appointment.   If you are experiencing a Mental Health or Behavioral Health Crisis or need someone to talk to, please call the Suicide and Crisis Lifeline: 988 call the [place] National Suicide Prevention Lifeline: 832-062-9732 or TTY: 236-059-0089 TTY 862-051-5881) to talk to a trained counselor call 1-800-273-TALK (toll free, 24 hour hotline)  Kathyrn Sheriff, RN, MSN, BSN, CCM Care Management Coordinator [phone]    Hypoglycemia Hypoglycemia is when the amount of sugar, or glucose, in your blood is too low. Low blood sugar can happen if you have diabetes or if you don't have diabetes. It may be an emergency. What are the causes? Low blood sugar happens most often in people who have diabetes. It may be caused by: Diabetes medicine. Not eating enough, or not eating often enough. Being more active than normal. If you don't have diabetes, you may still get low blood sugar if: There's a tumor in your pancreas. A tumor is a growth of cells that isn't normal. You don't eat enough, or you fast. Fasting is when you don't eat for long periods at a time. You have a bad infection or illness. You have problems after weight loss surgery. You have kidney or liver problems. You take certain medicines. What increases the risk? You're more likely to have low blood sugar if: You have diabetes and  take medicine for it. You drink a lot of alcohol. You get sick. What are the signs or symptoms? Mild Hunger or feeling like you may vomit. Sweating and feeling cold to the touch. Feeling dizzy or light-headed. Being sleepy or having trouble sleeping. A headache. Blurry vision. Mood changes. These include feeling worried, nervous, or easily annoyed. Moderate Feeling confused. Changes in the way you act. Weakness. An uneven heartbeat. Very bad Having very low blood sugar is an emergency. It can cause: Fainting. Seizures. A coma. Death. How is this diagnosed?  Low blood sugar can be found with a blood test. This test tells you how much sugar is in your blood. It's done while you're having symptoms. Your health care provider may also do an exam and look at your medical history. How is this treated? Treating low blood sugar If you have low blood sugar, eat or drink something with sugar in it right away. The food or drink should have 15 grams of a fast-acting carbohydrate (carb). Options include: 4 oz (120 mL) of fruit juice. 4 oz (120 mL) of soda (not diet soda). A few pieces of hard candy. Check food labels to see how many pieces to eat. 1 Tbsp (15 mL) of sugar or honey. 4 glucose tablets. 1 tube of glucose gel. Treating low blood sugar if you have diabetes Talk with your provider about how much carb you should take. If you're alert and can swallow safely, you may follow the 15:15 rule: Take 15 grams of a fast-acting carb. Check your blood sugar  15 minutes after you take the carb. If your blood sugar is still at or below 70 mg/dL (3.9 mmol/L), take 15 grams of a carb again. If your blood sugar doesn't go above 70 mg/dL (3.9 mmol/L) after 3 tries, get help right away. After your blood sugar goes back to normal, eat a meal or a snack within 1 hour. Always keep 15 grams of a fast-acting carb with you. This could be: 4 glucose tablets. A few pieces of hard candy. 1 Tbsp (15 mL)  of honey or sugar. 1 tube of glucose gel. Treating very low blood sugar If your blood sugar is less than 54 mg/dL (3 mmol/L), it's an emergency. Get help right away. If you can't eat or drink, you will need to be given glucagon. A family member or friend should learn how to check your blood sugar and give you glucagon. Ask your provider if you should keep a glucagon kit at home. You may also need to be treated in a hospital. Follow these instructions at home: If you have diabetes: Always keep a fast-acting carb (15 grams) with you. Follow your diabetes care plan. Make sure you: Know the symptoms of low blood sugar. Check your blood sugar as often as told. Always check it before and after you exercise. Always check your blood sugar before you drive. Take your medicines as told. Eat on time. Do not skip meals. Share your diabetes care plan with: Your work or school. The people you live with. Wear an alert bracelet or carry a card that says you have diabetes. General instructions If you drink alcohol: Limit how much you have to: 0-1 drink a day if you're female. 0-2 drinks a day if you're female. Know how much alcohol is in your drink. In the [place] one drink is one 12 oz bottle of beer (355 mL), one 5 oz glass of wine (148 mL), or one 1 oz glass of hard liquor (44 mL). Be sure to eat food when you drink alcohol. Be sure to check your blood sugar after you drink. Alcohol may lead to low blood sugar later. Where to find more information American Diabetes Association (ADA): diabetes.org Contact a health care provider if: You have low blood sugar often. You have diabetes and are having trouble keeping your blood sugar in the right range. Get help right away if: You can't get your blood sugar above 70 mg/dL (3.9 mmol/L) after 3 tries. Your blood sugar is below 54 mg/dL (3 mmol/L). You have a seizure. You faint. These symptoms may be an emergency. Call 911 right away. Do not wait to see  if the symptoms will go away. Do not drive yourself to the hospital. This information is not intended to replace advice given to you by your health care provider. Make sure you discuss any questions you have with your health care provider. Document Revised: 04/30/2022 Document Reviewed: 04/30/2022 Elsevier Patient Education  2024 [employer].

## 2022-11-23 NOTE — Progress Notes (Unsigned)
Follow Up Note  RE: Katrina Vega MRN: [id number] DOB: 01/10/1956 Date of Office Visit: 11/24/2022  Referring provider: Donato Schultz, * Primary care provider: Zola Button, Grayling Congress, DO  Chief Complaint: No chief complaint on file.  History of Present Illness: I had the pleasure of seeing Katrina Vega for a follow up visit at the [facility] on 11/23/2022. She is a 67 y.o. female, who is being followed for asthma/COPD on Fasenra, allergic rhinoconjunctivitis on AIT, GERD. Her previous allergy office visit was on 08/20/2022 with Dr. Selena Batten. Today is a regular follow up visit.  Discussed the use of AI scribe software for clinical note transcription with the patient, who gave verbal consent to proceed.  History of Present Illness            Asthma-COPD overlap syndrome Symptoms flared since traveling to [place] and on a cruise.  Make a follow up with your cardiologist - and discuss whether lisinopril can be stopped or switched so we can monitor if the coughing improves.  Today's spirometry was unremarkable - improved from previous one.  If breathing is not better by Friday - then will send in prednisone.  Daily controller medication(s): Breztri 2 puffs twice a day with spacer and rinse mouth afterwards. Fasenra injections every 8 weeks. Continue Singulair (montelukast) 10mg  daily at night. During respiratory infections/flares:  Start Pulmicort (budesonide) 0.5mg  nebulizer twice a day for 1-2 weeks until your breathing symptoms return to baseline.  Pretreat with albuterol 2 puffs or albuterol nebulizer.  If you need to use your albuterol nebulizer machine back to back within 15-30 minutes with no relief then please go to the ER/urgent care for further evaluation.  May use albuterol rescue inhaler 2 puffs or nebulizer every 4 to 6 hours as needed for shortness of breath, chest tightness, coughing, and wheezing. May use albuterol rescue inhaler 2 puffs 5 to 15  minutes prior to strenuous physical activities. Monitor frequency of use - if you need to use it more than twice per week on a consistent basis let us know.  Get spirometry at next visit.   Seasonal and perennial allergic rhinoconjunctivitis Past history - started on allergy injections in [place] in 2021 [facility]). Interim history - restarted AIT.  Continue environmental control measures. Continue allergy injections. Skip on the days when your asthma is acting up. Use over the counter antihistamines such as Zyrtec (cetirizine), Claritin (loratadine), Allegra (fexofenadine), or Xyzal (levocetirizine) daily as needed. May take twice a day during allergy flares. May switch antihistamines every few months. Use Nasacort (triamcinolone) nasal spray 1 spray per nostril twice a day as needed for nasal congestion.  Use azelastine nasal spray 1-2 sprays per nostril twice a day as needed for runny nose/drainage. Nasal saline spray (i.e., Simply Saline) or nasal saline lavage (i.e., NeilMed) is recommended as needed and prior to medicated nasal sprays. Use cromolyn 4% 1 drop in each eye up to four times a day as needed for itchy/watery eyes.     Gastroesophageal reflux disease Continue lifestyle and dietary modifications. Continue Protonix 40mg  twice day - nothing to eat or drink for 20-30 minutes afterwards.   Dietary counseling and surveillance Concern regarding food allergies. Bloodwork was all negative. Patient tolerating chicken, onion, cranberry, peanuts and tree nuts with no issues. No dietary restrictions.   Assessment and Plan: Katrina Vega is a 67 y.o. female with: Asthma-COPD overlap syndrome ***  Seasonal allergic rhinitis due to pollen Allergic rhinitis  due to dust mite Allergic rhinitis due to mold Allergic rhinitis due to animal dander Past history - started on AIT in [place] in 2021 [facility]). Interim history -   Gastroesophageal reflux disease, unspecified whether  esophagitis present ***  Assessment and Plan              No follow-ups on file.  No orders of the defined types were placed in this encounter.  Lab Orders  No laboratory test(s) ordered today    Diagnostics: Spirometry:  Tracings reviewed. Her effort: {Blank single:19197::"Good reproducible efforts.","It was hard to get consistent efforts and there is a question as to whether this reflects a maximal maneuver.","Poor effort, data can not be interpreted."} FVC: ***L FEV1: ***L, ***% predicted FEV1/FVC ratio: ***% Interpretation: {Blank single:19197::"Spirometry consistent with mild obstructive disease","Spirometry consistent with moderate obstructive disease","Spirometry consistent with severe obstructive disease","Spirometry consistent with possible restrictive disease","Spirometry consistent with mixed obstructive and restrictive disease","Spirometry uninterpretable due to technique","Spirometry consistent with normal pattern","No overt abnormalities noted given today's efforts"}.  Please see scanned spirometry results for details.  Skin Testing: {Blank single:19197::"Select foods","Environmental allergy panel","Environmental allergy panel and select foods","Food allergy panel","None","Deferred due to recent antihistamines use"}. *** Results discussed with patient/family.   Medication List:  Current Outpatient Medications  Medication Sig Dispense Refill  . albuterol (PROVENTIL) (2.5 MG/3ML) 0.083% nebulizer solution Take 3 mLs (2.5 mg total) by nebulization every 4 (four) hours as needed for wheezing or shortness of breath (coughing fits). 75 mL 1  . albuterol (VENTOLIN HFA) 108 (90 Base) MCG/ACT inhaler Inhale 2 puffs into the lungs every 6 (six) hours as needed for wheezing. 18 g 1  . BD AUTOSHIELD DUO 30G X 5 MM MISC 1 KIT BY MISC ROUTE 3 TIMES DAILY BEFORE MEALS    . Budeson-Glycopyrrol-Formoterol (BREZTRI AEROSPHERE) 160-9-4.8 MCG/ACT AERO INHALE 2 INHALATIONS BY MOUTH   TWICE DAILY TO PREVENT COUGH OR  WHEEZE. RINSE, GARGLE, AND SPIT  AFTER USE 32.1 g 1  . budesonide (PULMICORT) 0.5 MG/2ML nebulizer solution Take 2 mLs (0.5 mg total) by nebulization in the morning and at bedtime. During respiratory infections for 1-2 weeks at a time. (Patient not taking: Reported on 11/20/2022) 120 mL 2  . bumetanide (BUMEX) 1 MG tablet TAKE 1 TABLET(1 MG) BY MOUTH DAILY 30 tablet 0  . cetirizine (ZYRTEC) 10 MG tablet Take 1 tablet by mouth daily.    . Cholecalciferol 50 MCG (2000 UT) TABS Take 2,000 Units by mouth daily.    . clopidogrel (PLAVIX) 75 MG tablet TAKE 1 TABLET BY MOUTH DAILY 100 tablet 2  . Continuous Blood Gluc Sensor (DEXCOM G6 SENSOR) MISC USE 1 SENSOR AS NEEDED CHANGE EVERY 10 DAYS    . Continuous Blood Gluc Transmit (DEXCOM G6 TRANSMITTER) MISC     . cromolyn (OPTICROM) 4 % ophthalmic solution Place 1 drop into both eyes 4 (four) times daily. INSTILL 1 DROP INTO BOTH EYES UP TO 4 TIMES DAILY AS NEEDED FOR  RED, ITCHY, OR WATERY EYES 60 mL 1  . diazepam (VALIUM) 5 MG tablet Take one tablet by mouth with food one hour prior to procedure. May repeat 30 minutes prior if needed. 2 tablet 0  . doxycycline (VIBRA-TABS) 100 MG tablet Take 1 tablet (100 mg total) by mouth 2 (two) times daily. (Patient not taking: Reported on 11/06/2022) 20 tablet 0  . empagliflozin (JARDIANCE) 10 MG TABS tablet Take 1 tablet (10 mg total) by mouth daily before breakfast. 30 tablet 3  . EPINEPHrine (EPIPEN  2-PAK) 0.3 mg/0.3 mL IJ SOAJ injection Use as directed for severe allergic reactions 2 each 3  . famotidine (PEPCID) 20 MG tablet Take 1 tablet (20 mg total) by mouth 2 (two) times daily. 60 tablet 2  . FASENRA PEN 30 MG/ML SOAJ Inject into the skin.    . fluconazole (DIFLUCAN) 150 MG tablet Take 1 tablet (150 mg total) by mouth daily. May repeat in 3 days if needed. 2 tablet 0  . gabapentin (NEURONTIN) 300 MG capsule TAKE 1 CAPSULE BY MOUTH 3 TIMES  DAILY 300 capsule 2  . glucagon 1 MG  injection Inject 1 mg into the muscle once as needed (low blood sugar).    Marland Kitchen glucose 4 GM chewable tablet Chew 1 tablet by mouth once as needed for low blood sugar.    Marland Kitchen HUMALOG 100 UNIT/ML injection Inject into the skin.    Marland Kitchen HUMULIN N 100 UNIT/ML injection Inject 60 Units into the skin 3 (three) times daily before meals. (Patient not taking: Reported on 11/06/2022)    . HYDROcodone-acetaminophen (NORCO/VICODIN) 5-325 MG tablet Take 1 tablet by mouth every 8 (eight) hours as needed for moderate pain. 20 tablet 0  . Insulin Disposable Pump (OMNIPOD 5 G6 INTRO, GEN 5,) KIT CHANGE POD EVERY 2 DAYS    . isosorbide mononitrate (IMDUR) 60 MG 24 hr tablet TAKE 1 TABLET BY MOUTH DAILY 100 tablet 2  . levalbuterol (XOPENEX HFA) 45 MCG/ACT inhaler USE 2 INHALATIONS BY MOUTH EVERY 4 TO 6 HOURS AS NEEDED FOR  SHORTNESS OF BREATH , COUGH,  WHEEZE AND TIGHTNESS IN CHEST (Patient not taking: Reported on 11/20/2022) 45 g 6  . levocetirizine (XYZAL) 5 MG tablet TAKE 1 TABLET BY MOUTH EVERY  EVENING IF NEEDED 100 tablet 3  . losartan (COZAAR) 25 MG tablet Take 1 tablet (25 mg total) by mouth daily. 90 tablet 2  . methocarbamol (ROBAXIN) 500 MG tablet Take 1 tablet (500 mg total) by mouth every 8 (eight) hours as needed for muscle spasms. (Patient not taking: Reported on 11/06/2022) 20 tablet 0  . metoprolol tartrate (LOPRESSOR) 50 MG tablet TAKE 1 TABLET BY MOUTH EVERY 12  HOURS 60 tablet 11  . montelukast (SINGULAIR) 10 MG tablet TAKE 1 TABLET BY MOUTH AT  BEDTIME 100 tablet 2  . nitroGLYCERIN (NITROSTAT) 0.4 MG SL tablet Place 1 tablet (0.4 mg total) under the tongue every 5 (five) minutes as needed for chest pain. 25 tablet 3  . OZEMPIC, 2 MG/DOSE, 8 MG/3ML SOPN Inject 2 mg into the skin once a week.    . pantoprazole (PROTONIX) 40 MG tablet Take 1 tablet (40 mg total) by mouth daily. 30 tablet 3  . potassium chloride (KLOR-CON) 10 MEQ tablet Take 1 tablet (10 mEq total) by mouth daily. 90 tablet 3  . Respiratory  Therapy Supplies (CARETOUCH 2 CPAP HOSE HANGER) MISC by Does not apply route.    . rosuvastatin (CRESTOR) 40 MG tablet TAKE 1 TABLET BY MOUTH AT  BEDTIME 100 tablet 2  . silver sulfADIAZINE (SILVADENE) 1 % cream Apply 1 Application topically daily. (Patient not taking: Reported on 11/20/2022) 50 g 0  . traMADol (ULTRAM) 50 MG tablet Take 1 tablet (50 mg total) by mouth every 8 (eight) hours as needed. (Patient not taking: Reported on 11/06/2022) 20 tablet 0   Current Facility-Administered Medications  Medication Dose Route Frequency Provider Last Rate Last Admin  . Benralizumab SOSY 30 mg  30 mg Subcutaneous Q8 Lynnae January, FNP  30 mg at 11/03/22 1312   Allergies: Allergies  Allergen Reactions  . Contrast Media [Iodinated Contrast Media] Shortness Of Breath and Other (See Comments)    sob, chest tight  . Morphine Itching  . Statins Diarrhea and Other (See Comments)    nausea, diarrhea, myalgias  . Ioversol   . Oxycodone Other (See Comments)  . Tramadol Itching and Nausea Only   I reviewed her past medical history, social history, family history, and environmental history and no significant changes have been reported from her previous visit.  Review of Systems  Constitutional:  Negative for appetite change, chills, fever and unexpected weight change.  HENT:  Positive for postnasal drip. Negative for congestion and rhinorrhea.   Eyes:  Negative for itching.  Respiratory:  Positive for cough and chest tightness. Negative for shortness of breath and wheezing.   Cardiovascular:  Negative for chest pain.  Gastrointestinal:  Negative for abdominal pain.  Genitourinary:  Negative for difficulty urinating.  Skin:  Negative for rash.  Allergic/Immunologic: Positive for environmental allergies.  Neurological:  Negative for headaches.   Objective: There were no vitals taken for this visit. There is no height or weight on file to calculate BMI. Physical Exam Vitals and nursing note  reviewed.  Constitutional:      Appearance: Normal appearance. She is well-developed.  HENT:     Head: Normocephalic and atraumatic.     Right Ear: Tympanic membrane and external ear normal.     Left Ear: Tympanic membrane and external ear normal.     Nose: Nose normal.     Mouth/Throat:     Mouth: Mucous membranes are moist.     Pharynx: Oropharynx is clear.  Eyes:     Conjunctiva/sclera: Conjunctivae normal.  Cardiovascular:     Rate and Rhythm: Normal rate and regular rhythm.     Heart sounds: Normal heart sounds. No murmur heard.    No friction rub. No gallop.  Pulmonary:     Effort: Pulmonary effort is normal.     Breath sounds: Normal breath sounds. No wheezing, rhonchi or rales.  Musculoskeletal:     Cervical back: Neck supple.  Skin:    General: Skin is warm.     Findings: No rash.  Neurological:     Mental Status: She is alert and oriented to person, place, and time.  Psychiatric:        Behavior: Behavior normal.  Previous notes and tests were reviewed. The plan was reviewed with the patient/family, and all questions/concerned were addressed.  It was my pleasure to see Katrina Vega today and participate in her care. Please feel free to contact me with any questions or concerns.  Sincerely,  Wyline Mood, DO Allergy & Immunology  [facility] office: [phone] [facility] office: [phone]

## 2022-11-24 ENCOUNTER — Ambulatory Visit (INDEPENDENT_AMBULATORY_CARE_PROVIDER_SITE_OTHER): Payer: 59 | Admitting: Allergy

## 2022-11-24 ENCOUNTER — Other Ambulatory Visit: Payer: Self-pay

## 2022-11-24 ENCOUNTER — Encounter: Payer: Self-pay | Admitting: Allergy

## 2022-11-24 VITALS — BP 134/88 | HR 75 | Temp 97.5°F | Resp 16 | Ht 65.0 in | Wt 245.8 lb

## 2022-11-24 DIAGNOSIS — K219 Gastro-esophageal reflux disease without esophagitis: Secondary | ICD-10-CM

## 2022-11-24 DIAGNOSIS — J301 Allergic rhinitis due to pollen: Secondary | ICD-10-CM

## 2022-11-24 DIAGNOSIS — J3081 Allergic rhinitis due to animal (cat) (dog) hair and dander: Secondary | ICD-10-CM | POA: Diagnosis not present

## 2022-11-24 DIAGNOSIS — J3089 Other allergic rhinitis: Secondary | ICD-10-CM

## 2022-11-24 DIAGNOSIS — J4489 Other specified chronic obstructive pulmonary disease: Secondary | ICD-10-CM

## 2022-11-24 MED ORDER — LEVOCETIRIZINE DIHYDROCHLORIDE 5 MG PO TABS: 5.0000 mg | ORAL_TABLET | Freq: Every evening | ORAL | 3 refills | Status: DC

## 2022-11-24 MED ORDER — LEVALBUTEROL HCL 0.63 MG/3ML IN NEBU: 0.6300 mg | INHALATION_SOLUTION | RESPIRATORY_TRACT | 1 refills | Status: AC | PRN

## 2022-11-24 NOTE — Patient Instructions (Addendum)
Use a hepa filter at your desk at work to decrease scents/smells.  - letter written.   Asthma Daily controller medication(s): Breztri 2 puffs twice a day with spacer and rinse mouth afterwards. Fasenra injections every 8 weeks. Continue Singulair (montelukast) 10mg  daily at night. During respiratory infections/flares:  Start Pulmicort (budesonide) 0.5mg  nebulizer twice a day for 1-2 weeks until your breathing symptoms return to baseline.  Pretreat with levoalbuterol 2 puffs or levoalbuterol nebulizer.  If you need to use your levoalbuterol nebulizer machine back to back within 15-30 minutes with no relief then please go to the ER/urgent care for further evaluation.  May use levoalbuterol rescue inhaler 2 puffs or nebulizer every 4 to 6 hours as needed for shortness of breath, chest tightness, coughing, and wheezing. May use levoalbuterol rescue inhaler 2 puffs 5 to 15 minutes prior to strenuous physical activities. Monitor frequency of use - if you need to use it more than twice per week on a consistent basis let us know.  Breathing control goals:  Full participation in all desired activities (may need albuterol before activity) Albuterol use two times or less a week on average (not counting use with activity) Cough interfering with sleep two times or less a month Oral steroids no more than once a year No hospitalizations   Environmental allergies Continue environmental control measures. Stop allergy injections. Use over the counter antihistamines such as Zyrtec (cetirizine), Claritin (loratadine), Allegra (fexofenadine), or Xyzal (levocetirizine) daily as needed. May take twice a day during allergy flares. May switch antihistamines every few months. Use Nasacort (triamcinolone) nasal spray 1 spray per nostril twice a day as needed for nasal congestion.  Use azelastine nasal spray 1-2 sprays per nostril twice a day as needed for runny nose/drainage. Nasal saline spray (i.e., Simply Saline)  or nasal saline lavage (i.e., NeilMed) is recommended as needed and prior to medicated nasal sprays. Use refresh eye drops as needed. Stop cromolyn eye drops - as it can worsen your dry eyes.   Reflux Continue lifestyle and dietary modifications. Continue Protonix 40mg  twice day - nothing to eat or drink for 20-30 minutes afterwards.  Recommend GI evaluation.    Follow up in 3 months or sooner if needed.

## 2022-11-26 ENCOUNTER — Telehealth: Payer: Self-pay

## 2022-11-26 NOTE — Progress Notes (Signed)
Care Guide Note  11/26/2022 Name: Katrina Vega MRN: [id number] DOB: March 17, 1955  Referred by: Donato Schultz, DO Reason for referral : Care Coordination (Outreach to schedule with Pharm d )   Katrina Vega is a 67 y.o. year old female who is a primary care patient of Donato Schultz, DO. Katrina Vega was referred to the pharmacist for assistance related to HLD and DM.    Successful contact was made with the patient to discuss pharmacy services including being ready for the pharmacist to call at least 5 minutes before the scheduled appointment time, to have medication bottles and any blood sugar or blood pressure readings ready for review. The patient agreed to meet with the pharmacist via with the pharmacist via telephone visit on (date/time).  12/10/2022  Penne Lash, RMA Care Guide [facility]  [place], [place] [address] Direct Dial: [phone] [email or web address]

## 2022-12-01 ENCOUNTER — Ambulatory Visit: Payer: 59 | Admitting: Family

## 2022-12-01 ENCOUNTER — Other Ambulatory Visit: Payer: 59

## 2022-12-02 ENCOUNTER — Inpatient Hospital Stay (HOSPITAL_BASED_OUTPATIENT_CLINIC_OR_DEPARTMENT_OTHER): Payer: 59 | Admitting: Medical Oncology

## 2022-12-02 ENCOUNTER — Inpatient Hospital Stay: Payer: 59 | Attending: Hematology & Oncology

## 2022-12-02 ENCOUNTER — Other Ambulatory Visit: Payer: Self-pay

## 2022-12-02 ENCOUNTER — Ambulatory Visit: Payer: 59 | Admitting: Family Medicine

## 2022-12-02 VITALS — BP 110/53 | HR 75 | Temp 97.9°F | Resp 18 | Ht 65.0 in | Wt 246.1 lb

## 2022-12-02 DIAGNOSIS — D509 Iron deficiency anemia, unspecified: Secondary | ICD-10-CM | POA: Insufficient documentation

## 2022-12-02 LAB — CBC WITH DIFFERENTIAL (CANCER CENTER ONLY)
Abs Immature Granulocytes: 0.04 10*3/uL (ref 0.00–0.07)
Basophils Absolute: 0 10*3/uL (ref 0.0–0.1)
Basophils Relative: 0 %
Eosinophils Absolute: 0.1 10*3/uL (ref 0.0–0.5)
Eosinophils Relative: 2 %
HCT: 40.8 % (ref 36.0–46.0)
Hemoglobin: 12.8 g/dL (ref 12.0–15.0)
Immature Granulocytes: 1 %
Lymphocytes Relative: 43 %
Lymphs Abs: 2.8 10*3/uL (ref 0.7–4.0)
MCH: 26.4 pg (ref 26.0–34.0)
MCHC: 31.4 g/dL (ref 30.0–36.0)
MCV: 84.1 fL (ref 80.0–100.0)
Monocytes Absolute: 0.4 10*3/uL (ref 0.1–1.0)
Monocytes Relative: 7 %
Neutro Abs: 3.1 10*3/uL (ref 1.7–7.7)
Neutrophils Relative %: 47 %
Platelet Count: 273 10*3/uL (ref 150–400)
RBC: 4.85 MIL/uL (ref 3.87–5.11)
RDW: 17.7 % — ABNORMAL HIGH (ref 11.5–15.5)
WBC Count: 6.5 10*3/uL (ref 4.0–10.5)
nRBC: 0 % (ref 0.0–0.2)

## 2022-12-02 LAB — RETICULOCYTES
Immature Retic Fract: 24.7 % — ABNORMAL HIGH (ref 2.3–15.9)
RBC.: 4.87 MIL/uL (ref 3.87–5.11)
Retic Count, Absolute: 88.1 10*3/uL (ref 19.0–186.0)
Retic Ct Pct: 1.8 % (ref 0.4–3.1)

## 2022-12-02 LAB — FERRITIN: Ferritin: 20 ng/mL (ref 11–307)

## 2022-12-02 NOTE — Progress Notes (Signed)
Hematology and Oncology Follow Up Visit  Katrina Vega [id number] [birth year] 67 y.o. 12/02/2022   Principle Diagnosis:  Iron deficiency anemia - unknown cause   Current Therapy:        IV iron as indicated - Injectafer   Interim History:  [name] is here today for follow-up.  Today she reports that she is doing well. She is going to have hand revision surgery/carpal tunnel release surgery in November.  [place] type but tolerated well without side effects or need for pre-meds.  No issue with blood loss. No abnormal bruising, no petechiae.  No fever, chills, n/v, cough, rash, dizziness, SOB, chest pain, palpitations, abdominal pain or changes in bowel or bladder habits.  No swelling, tenderness, numbness or tingling in her extremities.  No falls or syncope.  Appetite and hydration are good.  Wt Readings from Last 3 Encounters:  12/02/22 246 lb 1.3 oz (111.6 kg)  11/24/22 245 lb 12.8 oz (111.5 kg)  11/06/22 241 lb 9.6 oz (109.6 kg)   ECOG Performance Status: 1 - Symptomatic but completely ambulatory  Medications:  Allergies as of 12/02/2022       Reactions   Contrast Media [iodinated Contrast Media] Shortness Of Breath, Other (See Comments)   sob, chest tight   Morphine Itching   Statins Diarrhea, Other (See Comments)   nausea, diarrhea, myalgias   Ioversol    Oxycodone Other (See Comments)   Tramadol Itching, Nausea Only        Medication List        Accurate as of December 02, 2022  3:23 PM. If you have any questions, ask your nurse or doctor.          BD AutoShield Duo 30G X 5 MM Misc Generic drug: Insulin Pen Needle 1 KIT BY MISC ROUTE 3 TIMES DAILY BEFORE MEALS   Breztri Aerosphere 160-9-4.8 MCG/ACT Aero Generic drug: Budeson-Glycopyrrol-Formoterol INHALE 2 INHALATIONS BY MOUTH  TWICE DAILY TO PREVENT COUGH OR  WHEEZE. RINSE, GARGLE, AND SPIT  AFTER USE   budesonide 0.5 MG/2ML nebulizer solution Commonly known  as: Pulmicort Take 2 mLs (0.5 mg total) by nebulization in the morning and at bedtime. During respiratory infections for 1-2 weeks at a time.   bumetanide 1 MG tablet Commonly known as: BUMEX TAKE 1 TABLET(1 MG) BY MOUTH DAILY   CareTouch 2 CPAP Hose [employer] Misc by Does not apply route.   Cholecalciferol 50 MCG (2000 UT) Tabs Take 2,000 Units by mouth daily.   clopidogrel 75 MG tablet Commonly known as: PLAVIX TAKE 1 TABLET BY MOUTH DAILY   Dexcom G6 Sensor Misc USE 1 SENSOR AS NEEDED CHANGE EVERY 10 DAYS   Dexcom G6 Transmitter Misc   diazepam 5 MG tablet Commonly known as: VALIUM Take one tablet by mouth with food one hour prior to procedure. May repeat 30 minutes prior if needed.   empagliflozin 10 MG Tabs tablet Commonly known as: Jardiance Take 1 tablet (10 mg total) by mouth daily before breakfast.   EPINEPHrine 0.3 mg/0.3 mL Soaj injection Commonly known as: EpiPen 2-Pak Use as directed for severe allergic reactions   Fasenra Pen 30 MG/ML prefilled autoinjector Generic drug: benralizumab Inject into the skin.   gabapentin 300 MG capsule Commonly known as: NEURONTIN TAKE 1 CAPSULE BY MOUTH 3 TIMES  DAILY   glucagon 1 MG injection Inject 1 mg into the muscle once as needed (low blood sugar).   glucose 4 GM chewable tablet  Chew 1 tablet by mouth once as needed for low blood sugar.   HumaLOG 100 UNIT/ML injection Generic drug: insulin lispro Inject into the skin.   HumuLIN N 100 UNIT/ML injection Generic drug: insulin NPH Human Inject 60 Units into the skin 3 (three) times daily before meals.   isosorbide mononitrate 60 MG 24 hr tablet Commonly known as: IMDUR TAKE 1 TABLET BY MOUTH DAILY   levalbuterol 0.63 MG/3ML nebulizer solution Commonly known as: Xopenex Take 3 mLs (0.63 mg total) by nebulization every 4 (four) hours as needed for wheezing or shortness of breath (coughing fits).   levalbuterol 45 MCG/ACT inhaler Commonly known as: XOPENEX  HFA USE 2 INHALATIONS BY MOUTH EVERY 4 TO 6 HOURS AS NEEDED FOR  SHORTNESS OF BREATH , COUGH,  WHEEZE AND TIGHTNESS IN CHEST   levocetirizine 5 MG tablet Commonly known as: XYZAL Take 1 tablet (5 mg total) by mouth every evening.   losartan 25 MG tablet Commonly known as: COZAAR Take 1 tablet (25 mg total) by mouth daily.   metoprolol tartrate 50 MG tablet Commonly known as: LOPRESSOR TAKE 1 TABLET BY MOUTH EVERY 12  HOURS   montelukast 10 MG tablet Commonly known as: SINGULAIR TAKE 1 TABLET BY MOUTH AT  BEDTIME   nitroGLYCERIN 0.4 MG SL tablet Commonly known as: NITROSTAT Place 1 tablet (0.4 mg total) under the tongue every 5 (five) minutes as needed for chest pain.   Omnipod 5 G6 Intro (Gen 5) Kit CHANGE POD EVERY 2 DAYS   Ozempic (2 MG/DOSE) 8 MG/3ML Sopn Generic drug: Semaglutide (2 MG/DOSE) Inject 2 mg into the skin once a week.   pantoprazole 40 MG tablet Commonly known as: PROTONIX Take 1 tablet (40 mg total) by mouth daily.   potassium chloride 10 MEQ tablet Commonly known as: KLOR-CON Take 1 tablet (10 mEq total) by mouth daily.   rosuvastatin 40 MG tablet Commonly known as: CRESTOR TAKE 1 TABLET BY MOUTH AT  BEDTIME   silver sulfADIAZINE 1 % cream Commonly known as: Silvadene Apply 1 Application topically daily.        Allergies:  Allergies  Allergen Reactions   Contrast Media [Iodinated Contrast Media] Shortness Of Breath and Other (See Comments)    sob, chest tight   Morphine Itching   Statins Diarrhea and Other (See Comments)    nausea, diarrhea, myalgias   Ioversol    Oxycodone Other (See Comments)   Tramadol Itching and Nausea Only    Past Medical History, Surgical history, Social history, and Family History were reviewed and updated.  Review of Systems: All other 10 point review of systems is negative.   Physical Exam:  height is 5\' 5"  (1.651 m) and weight is 246 lb 1.3 oz (111.6 kg). Her oral temperature is 97.9 F (36.6 C). Her  blood pressure is 110/53 (abnormal) and her pulse is 75. Her respiration is 18 and oxygen saturation is 97%.   Wt Readings from Last 3 Encounters:  12/02/22 246 lb 1.3 oz (111.6 kg)  11/24/22 245 lb 12.8 oz (111.5 kg)  11/06/22 241 lb 9.6 oz (109.6 kg)    Ocular: Sclerae unicteric, pupils equal, round and reactive to light Ear-nose-throat: Oropharynx clear, dentition fair Lymphatic: No cervical or supraclavicular adenopathy Lungs no rales or rhonchi, good excursion bilaterally Heart regular rate and rhythm, no murmur appreciated Abd soft, nontender, positive bowel sounds MSK no focal spinal tenderness, no joint edema Neuro: non-focal, well-oriented, appropriate affect   Lab Results  Component Value Date  WBC 6.5 12/02/2022   HGB 12.8 12/02/2022   HCT 40.8 12/02/2022   MCV 84.1 12/02/2022   PLT 273 12/02/2022   Lab Results  Component Value Date   FERRITIN 11 06/30/2022   IRON 69 06/30/2022   TIBC 479 (H) 06/30/2022   UIBC 410 06/30/2022   IRONPCTSAT 14 06/30/2022   Lab Results  Component Value Date   RETICCTPCT 1.8 12/02/2022   RBC 4.85 12/02/2022   No results found for: "KPAFRELGTCHN", "LAMBDASER", "KAPLAMBRATIO" No results found for: "IGGSERUM", "IGA", "IGMSERUM" No results found for: "TOTALPROTELP", "ALBUMINELP", "A1GS", "A2GS", "BETS", "BETA2SER", "GAMS", "MSPIKE", "SPEI"   Chemistry      Component Value Date/Time   NA 138 09/23/2022 0909   K 3.8 09/23/2022 0909   CL 103 09/23/2022 0909   CO2 27 09/23/2022 0909   BUN 10 09/23/2022 0909   CREATININE 1.08 09/23/2022 0909   CREATININE 0.99 02/12/2021 0849      Component Value Date/Time   CALCIUM 10.0 09/23/2022 0909   ALKPHOS 66 09/23/2022 0909   AST 12 09/23/2022 0909   AST 13 (L) 02/12/2021 0849   ALT 11 09/23/2022 0909   ALT 13 02/12/2021 0849   BILITOT 0.7 09/23/2022 0909   BILITOT 0.5 02/12/2021 0849      Encounter Diagnosis  Name Primary?   Iron deficiency anemia, unspecified iron deficiency  anemia type Yes    Impression and Plan: Ms. Kromer is a very pleasant 67 yo African American female with long history of iron deficiency anemia. S/p hysterectomy in 1989 She had repeat colonoscopy in 09/2017 performed by Dr. Noe Gens which Was performed due irregular bowel patterns. Findings included mild diverticulosis and external hemorrhoids. So tissue was obtained and it was recommended she have repeat Colonoscopy in 10 years. She had an EGD was done in early February, 2019. The pathology from 03/30/2017 showed gastric mucosa with no specific pathologic change that was negative for staining for Helicobacter pylori.   Hgb 12.8 - stable  Previously given Injectafer Iron studies are pending.  RTC 5 months APP, labs  Rushie Chestnut, [place] 10/8/20243:23 PM

## 2022-12-03 ENCOUNTER — Ambulatory Visit: Payer: 59 | Admitting: Physical Medicine and Rehabilitation

## 2022-12-03 ENCOUNTER — Other Ambulatory Visit: Payer: Self-pay

## 2022-12-03 ENCOUNTER — Other Ambulatory Visit: Payer: Self-pay | Admitting: Medical Oncology

## 2022-12-03 VITALS — BP 154/65 | HR 79

## 2022-12-03 DIAGNOSIS — M47816 Spondylosis without myelopathy or radiculopathy, lumbar region: Secondary | ICD-10-CM

## 2022-12-03 LAB — IRON AND IRON BINDING CAPACITY (CC-WL,HP ONLY)
Iron: 69 ug/dL (ref 28–170)
Saturation Ratios: 15 % (ref 10.4–31.8)
TIBC: 451 ug/dL — ABNORMAL HIGH (ref 250–450)
UIBC: 382 ug/dL (ref 148–442)

## 2022-12-03 MED ORDER — METHYLPREDNISOLONE ACETATE 40 MG/ML IJ SUSP
40.0000 mg | Freq: Once | INTRAMUSCULAR | Status: AC
Start: 2022-12-03 — End: 2022-12-03
  Administered 2022-12-03: 40 mg

## 2022-12-03 NOTE — Progress Notes (Signed)
ANALIZ ESSARY - 67 y.o. female MRN [id number]  Date of birth: [birth year]  Office Visit Note: Visit Date: 12/03/2022 PCP: Donato Schultz, DO Referred by: Donato Schultz, *  Subjective: Chief Complaint  Patient presents with   Lower Back - Pain   HPI:  Katrina Vega is a 67 y.o. female who comes in today at the request of Ellin Goodie, FNP for planned Bilateral  L4-5 and L5-S1 Lumbar facet/medial branch block with fluoroscopic guidance.  The patient has failed conservative care including home exercise, medications, time and activity modification.  This injection will be diagnostic and hopefully therapeutic.  Please see requesting physician notes for further details and justification.  Exam has shown concordant pain with facet joint loading.  He complains of groin pain and we initially completed a hip intra-articular injection fluoroscopic guidance.  When she left the office that day she had some relief but it was not dramatic and she tells me Nauert did not help very much at all.  She does list a contrast allergy with shortness of breath but not anaphylaxis.  We have been able to use a very small amount of contrast in the joint injection without any difficulty.   ROS Otherwise per HPI.  Assessment & Plan: Visit Diagnoses:    ICD-10-CM   1. Spondylosis without myelopathy or radiculopathy, lumbar region  M47.816 methylPREDNISolone acetate (DEPO-MEDROL) injection 40 mg    XR C-ARM NO REPORT    Facet Injection      Plan: No additional findings.   Meds & Orders:  Meds ordered this encounter  Medications   methylPREDNISolone acetate (DEPO-MEDROL) injection 40 mg    Orders Placed This Encounter  Procedures   Facet Injection   XR C-ARM NO REPORT    Follow-up: Return for visit to requesting provider as needed.   Procedures: No procedures performed  Lumbar Facet Joint Intra-Articular Injection(s) with Fluoroscopic Guidance  Patient: Katrina Vega      Date of Birth:  03-Feb-1956 MRN: [id number] PCP: Donato Schultz, DO      Visit Date: 12/03/2022   Universal Protocol:    Date/Time: 12/03/2022  Consent Given By: the patient  Position: PRONE   Additional Comments: Vital signs were monitored before and after the procedure. Patient was prepped and draped in the usual sterile fashion. The correct patient, procedure, and site was verified.   Injection Procedure Details:  Procedure Site One Meds Administered:  Meds ordered this encounter  Medications   methylPREDNISolone acetate (DEPO-MEDROL) injection 40 mg     Laterality: Bilateral  Location/Site:  L4-L5 L5-S1  Needle size: 22 guage  Needle type: Spinal  Needle Placement: Articular  Findings:  -Comments: Excellent flow of contrast producing a partial arthrogram.  Procedure Details: The fluoroscope beam is vertically oriented in AP, and the inferior recess is visualized beneath the lower pole of the inferior apophyseal process, which represents the target point for needle insertion. When direct visualization is difficult the target point is located at the medial projection of the vertebral pedicle. The region overlying each aforementioned target is locally anesthetized with a 1 to 2 ml. volume of 1% Lidocaine without Epinephrine.   The spinal needle was inserted into each of the above mentioned facet joints using biplanar fluoroscopic guidance. A 0.25 to 0.5 ml. volume of Isovue-250 was injected and a partial facet joint arthrogram was obtained. A single spot film was obtained of the resulting arthrogram.    One to 1.25 ml  of the steroid/anesthetic solution was then injected into each of the facet joints noted above.   Additional Comments:  No complications occurred Dressing: 2 x 2 sterile gauze and Band-Aid    Post-procedure details: Patient was observed during the procedure. Post-procedure instructions were reviewed.  Patient left the clinic in stable condition.     Clinical History: CLINICAL DATA:  Low back pain, symptoms persist with > 6 wks treatment   EXAM: MRI LUMBAR SPINE WITHOUT CONTRAST   TECHNIQUE: Multiplanar, multisequence MR imaging of the lumbar spine was performed. No intravenous contrast was administered.   COMPARISON:  MR L Spine 09/11/20   FINDINGS: Segmentation:  Standard.   Alignment:  Trace stepwise anterolisthesis of L3 on L4 and L4 on L5.   Vertebrae: No fracture, evidence of discitis, or bone lesion. Mild T2/stir hyperintense signal abnormality of the superior endplate of L2 is favored to be degenerative.   Conus medullaris and cauda equina: Conus extends to the L1-L2 disc space level. Conus and cauda equina appear normal.   Paraspinal and other soft tissues: Negative.   Disc levels:   T12-L1: Mild bilateral facet degenerative change. Minimal disc bulge. No spinal canal narrowing. No neural foraminal narrowing.   L1-L2: Mild bilateral facet degenerative change. No significant disc bulge. No spinal canal narrowing. Mild right neural foraminal narrowing.   L2-L3: Mild bilateral facet degenerative change. Minimal disc bulge. Mild spinal canal narrowing. Moderate bilateral neural foraminal narrowing.   L3-L4: Moderate to severe bilateral facet degenerative change. Circumferential disc bulge. Moderate spinal canal narrowing. Moderate bilateral neural foraminal narrowing.   L4-L5: Moderate to severe bilateral facet degenerative change. Minimal disc bulge. Mild spinal canal narrowing. Moderate bilateral neural foraminal narrowing.   L5-S1: Severe left and moderate right facet degenerative change. Minimal disc bulge. No spinal canal narrowing. Moderate right and mild left neural foraminal narrowing. There is a 4 x 4 mm intraspinal synovial cyst arising from the right-sided facet joint.   IMPRESSION: Multilevel degenerative changes of the lumbar spine, most pronounced at L3-L4 where there is moderate spinal  canal narrowing and moderate bilateral neural foraminal narrowing.     Electronically Signed   By: Lorenza Cambridge M.D.   On: 09/27/2022 20:05     Objective:  VS:  HT:    WT:   BMI:     BP:(!) 154/65  HR:79bpm  TEMP: ( )  RESP:  Physical Exam Vitals and nursing note reviewed.  Constitutional:      General: She is not in acute distress.    Appearance: Normal appearance. She is obese. She is not ill-appearing.  HENT:     Head: Normocephalic and atraumatic.     Right Ear: External ear normal.     Left Ear: External ear normal.  Eyes:     Extraocular Movements: Extraocular movements intact.  Cardiovascular:     Rate and Rhythm: Normal rate.     Pulses: Normal pulses.  Pulmonary:     Effort: Pulmonary effort is normal. No respiratory distress.  Abdominal:     General: There is no distension.     Palpations: Abdomen is soft.  Musculoskeletal:        General: Tenderness present.     Cervical back: Neck supple.     Right lower leg: No edema.     Left lower leg: No edema.     Comments: Patient has good distal strength with no pain over the greater trochanters.  No clonus or focal weakness.  Skin:  Findings: No erythema, lesion or rash.  Neurological:     General: No focal deficit present.     Mental Status: She is alert and oriented to person, place, and time.     Sensory: No sensory deficit.     Motor: No weakness or abnormal muscle tone.     Coordination: Coordination normal.  Psychiatric:        Mood and Affect: Mood normal.        Behavior: Behavior normal.      Imaging: No results found.

## 2022-12-03 NOTE — Patient Instructions (Signed)

## 2022-12-03 NOTE — Progress Notes (Signed)
Functional Pain Scale - descriptive words and definitions  Distracting (5)    Aware of pain/able to complete some ADL's but limited by pain/sleep is affected and active distractions are only slightly useful. Moderate range order  Average Pain  varies   +Driver, -BT, -Dye Allergies.  Lower back pain on both sides. Burning in the right leg from lower back to the ankle

## 2022-12-04 ENCOUNTER — Ambulatory Visit: Payer: 59 | Admitting: Family Medicine

## 2022-12-04 ENCOUNTER — Telehealth: Payer: Self-pay

## 2022-12-04 NOTE — Telephone Encounter (Signed)
Per Clent Jacks: she specifially requested Injectafer. Can we let her know that it is not overed and ask her if she is ok with the Venofer again. Pt advised and states she is fine to receive Venofer. Schedulers to contact pt and set up infusion appointment.

## 2022-12-05 ENCOUNTER — Other Ambulatory Visit: Payer: Self-pay | Admitting: Physical Medicine and Rehabilitation

## 2022-12-05 DIAGNOSIS — M5416 Radiculopathy, lumbar region: Secondary | ICD-10-CM

## 2022-12-05 NOTE — Progress Notes (Signed)
Injectafer is not on preferred list for patient's insurance. Changing to Venofer per Clent Jacks, NP.

## 2022-12-08 ENCOUNTER — Ambulatory Visit: Payer: 59 | Admitting: Family Medicine

## 2022-12-09 ENCOUNTER — Inpatient Hospital Stay: Payer: 59

## 2022-12-09 VITALS — BP 138/68 | HR 78 | Temp 98.5°F | Resp 18

## 2022-12-09 DIAGNOSIS — D509 Iron deficiency anemia, unspecified: Secondary | ICD-10-CM | POA: Diagnosis not present

## 2022-12-09 MED ORDER — SODIUM CHLORIDE 0.9 % IV SOLN: INTRAVENOUS | Status: DC

## 2022-12-09 MED ORDER — SODIUM CHLORIDE 0.9 % IV SOLN
300.0000 mg | Freq: Once | INTRAVENOUS | Status: AC
  Administered 2022-12-09: 300 mg via INTRAVENOUS
  Filled 2022-12-09: qty 300

## 2022-12-09 NOTE — Patient Instructions (Signed)
Iron Sucrose Injection What is this medication? IRON SUCROSE (EYE ern SOO krose) treats low levels of iron (iron deficiency anemia) in people with kidney disease. Iron is a mineral that plays an important role in making red blood cells, which carry oxygen from your lungs to the rest of your body. This medicine may be used for other purposes; ask your health care provider or pharmacist if you have questions. COMMON BRAND NAME(S): Venofer What should I tell my care team before I take this medication? They need to know if you have any of these conditions: Anemia not caused by low iron levels Heart disease High levels of iron in the blood Kidney disease Liver disease An unusual or allergic reaction to iron, other medications, foods, dyes, or preservatives Pregnant or trying to get pregnant Breastfeeding How should I use this medication? This medication is for infusion into a vein. It is given in a hospital or clinic setting. Talk to your care team about the use of this medication in children. While this medication may be prescribed for children as young as 2 years for selected conditions, precautions do apply. Overdosage: If you think you have taken too much of this medicine contact a poison control center or emergency room at once. NOTE: This medicine is only for you. Do not share this medicine with others. What if I miss a dose? Keep appointments for follow-up doses. It is important not to miss your dose. Call your care team if you are unable to keep an appointment. What may interact with this medication? Do not take this medication with any of the following: Deferoxamine Dimercaprol Other iron products This medication may also interact with the following: Chloramphenicol Deferasirox This list may not describe all possible interactions. Give your health care provider a list of all the medicines, herbs, non-prescription drugs, or dietary supplements you use. Also tell them if you smoke,  drink alcohol, or use illegal drugs. Some items may interact with your medicine. What should I watch for while using this medication? Visit your care team regularly. Tell your care team if your symptoms do not start to get better or if they get worse. You may need blood work done while you are taking this medication. You may need to follow a special diet. Talk to your care team. Foods that contain iron include: whole grains/cereals, dried fruits, beans, or peas, leafy green vegetables, and organ meats (liver, kidney). What side effects may I notice from receiving this medication? Side effects that you should report to your care team as soon as possible: Allergic reactions--skin rash, itching, hives, swelling of the face, lips, tongue, or throat Low blood pressure--dizziness, feeling faint or lightheaded, blurry vision Shortness of breath Side effects that usually do not require medical attention (report to your care team if they continue or are bothersome): Flushing Headache Joint pain Muscle pain Nausea Pain, redness, or irritation at injection site This list may not describe all possible side effects. Call your doctor for medical advice about side effects. You may report side effects to FDA at 1-800-FDA-1088. Where should I keep my medication? This medication is given in a hospital or clinic. It will not be stored at home. NOTE: This sheet is a summary. It may not cover all possible information. If you have questions about this medicine, talk to your doctor, pharmacist, or health care provider.  2024 Elsevier/Gold Standard (2022-07-18 00:00:00)

## 2022-12-10 ENCOUNTER — Ambulatory Visit: Payer: 59 | Attending: Physician Assistant

## 2022-12-10 ENCOUNTER — Ambulatory Visit: Payer: 59 | Admitting: Pharmacist

## 2022-12-10 DIAGNOSIS — E1122 Type 2 diabetes mellitus with diabetic chronic kidney disease: Secondary | ICD-10-CM

## 2022-12-10 DIAGNOSIS — N1832 Chronic kidney disease, stage 3b: Secondary | ICD-10-CM

## 2022-12-10 DIAGNOSIS — J4489 Other specified chronic obstructive pulmonary disease: Secondary | ICD-10-CM

## 2022-12-10 DIAGNOSIS — E785 Hyperlipidemia, unspecified: Secondary | ICD-10-CM

## 2022-12-10 DIAGNOSIS — Z01810 Encounter for preprocedural cardiovascular examination: Secondary | ICD-10-CM | POA: Diagnosis not present

## 2022-12-10 DIAGNOSIS — I509 Heart failure, unspecified: Secondary | ICD-10-CM

## 2022-12-10 DIAGNOSIS — Z794 Long term (current) use of insulin: Secondary | ICD-10-CM

## 2022-12-10 NOTE — Progress Notes (Signed)
Virtual Visit via Telephone Note   Because of Katrina Vega's co-morbid illnesses, she is at least at moderate risk for complications without adequate follow up.  This format is felt to be most appropriate for this patient at this time.  The patient did not have access to video technology/had technical difficulties with video requiring transitioning to audio format only (telephone).  All issues noted in this document were discussed and addressed.  No physical exam could be performed with this format.  Please refer to the patient's chart for her consent to telehealth for [facility].  Evaluation Performed:  Preoperative cardiovascular risk assessment _____________   Date:  12/10/2022   Patient ID:  Katrina Vega, DOB Jan 05, 1956, MRN [id number] Patient Location:  Home Provider location:   Office  Primary Care Provider:  Zola Button, Grayling Congress, DO Primary Cardiologist:  Bryan Lemma, MD  Chief Complaint / Patient Profile   67 y.o. y/o female with a h/o PAD status post right post tib DES, bilateral SFA stents, type 2 diabetes mellitus, HTN, HFpEF who is pending right revision carpal tunnel release and presents today for telephonic preoperative cardiovascular risk assessment.  History of Present Illness    Katrina Vega is a 67 y.o. female who presents via audio/video conferencing for a telehealth visit today.  Pt was last seen in cardiology clinic on 11/06/2022 by Dr. Herbie Baltimore.  At that time SHAMECCA SPOERL was doing well .  The patient is now pending procedure as outlined above. Since her last visit, she still feels good since she last saw Dr. Herbie Baltimore.  No chest pains or shortness of breath.  She still feels good. No chest pains or SOB. Back issues hold her back. She has two bulging discs right leg was hurting and burning.injections didn't work. She is meeting with the surgeon. 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   Per Dr. Elissa Hefty last note, she  may hold plavix 5-7 days prior to procedures.    Past Medical History    Past Medical History:  Diagnosis Date   Asthma    CAD S/P two-vessel DES PCI 2008   LAD and RI; 2013 PTCA of small RCA.   CHF (congestive heart failure), NYHA class I, chronic, diastolic (HCC) 2019   Recent Echo 12/16/2021: Mild concentric LVH.  EF 59%.  GI 1 DD.  Normal LAP-(Mildly dilated LA;; has converted from to torsemide and now on bumetanide   CKD stage 3 due to type 2 diabetes mellitus (HCC)    COPD (chronic obstructive pulmonary disease) (HCC) 2021   Complicated by asthma and seasonal allergies.   Diabetes mellitus, type II, insulin dependent (HCC) 2010   On insulin 60 units TID Premeal.  Also on Jardiance and Ozempic.   Eczema    Essential hypertension    Heart attack [facility]) 2008   2008-two-vessel PCI; 2013 PTCA only small RCA   History of left hip replacement 04/2018   Hyperlipidemia associated with type 2 diabetes mellitus (HCC)    140 mg rosuvastatin   Insulin pump in place    PAD (peripheral artery disease) (HCC)    Status post bilateral SFA stents and right posterior tibial DES stent   Primary hypertension 01/20/2011      Patient's blood pressure is well controlled.  Continue current medications.   Past Surgical History:  Procedure Laterality Date   AAA DUPLEX  10/09/2020   Max Aorta (sac) diameter 2.51 cm prox with  mild ectasia in Prox Aorta. Diffuse plaque in mid-distal Aorta. Bilateral ICA poorly visulailzed - appear to be Severely stenosed with difuse plaque. - Consider CTA of cath directed Angio.   APPENDECTOMY  1974   BIOPSY  02/22/2021   Procedure: BIOPSY;  Surgeon: Jeani Hawking, MD;  Location: [facility];  Service: Endoscopy;;   CARPAL TUNNEL RELEASE  2017   COLONOSCOPY WITH PROPOFOL N/A 02/22/2021   Procedure: COLONOSCOPY WITH PROPOFOL;  Surgeon: Jeani Hawking, MD;  Location: [facility];  Service: Endoscopy;  Laterality: N/A;   CORONARY BALLOON ANGIOPLASTY  2013   PTCA of RCA  followed by PCI   CORONARY STENT INTERVENTION  2008   Text DES PCI to LAD and RI/OM1; also prox RCA   ESOPHAGOGASTRODUODENOSCOPY (EGD) WITH PROPOFOL N/A 02/22/2021   Procedure: ESOPHAGOGASTRODUODENOSCOPY (EGD) WITH PROPOFOL;  Surgeon: Jeani Hawking, MD;  Location: [facility];  Service: Endoscopy;  Laterality: N/A;   LEA Dopplers  10/09/2020   R mid & Distal SFA -CTO - dampend flow in R Pop via collaterals - Moderate velocity increase in R PFA. No signficant stenosis in LLE.   R ABI 0.97) - normal. L ABI - 0.91 w/ monophasic flow in L AT -> c/w 11/2019- R SFA new.   LEFT HEART CATH AND CORONARY ANGIOGRAPHY  12/22/2017   Mild LM plaque.  Mild proximal LAD plaque.  High D1-40% ostial.  Patent mid LAD DES (Taxus from 2008), large distal LAD free disease; Prox LCx-OM1 stents patent (Taxus 2008). Small RCA - patent prox stent(~50-60% ISR), PTCA site from 2013 - patent   LOWER EXTREMITY ANGIOGRAM Bilateral 12/22/2017   Bilateral SFA stents with evidence of severe right and mid left ISR bilateral popliteal arteries proximal trifurcation vessels are patent.  Moderate to severe lesion involving mid R AntTib, poor distal flow in L Ant Tib - 2 V runoff Bilat. --> referred for R SFA Laser Atherectomy & DCB 5 x 120 mm.   LOWER EXTREMITY INTERVENTION Right 12/22/2017   Laser atherectomy of right SFA followed by [facility] with 5.0 x 120 mm impact.-For severe right SFA ISR   LUMBAR SPINE SURGERY  2010   NM MYOVIEW LTD  07/07/2019   [facility]): Lexiscan.  Nondiagnostic EKG.  Dyspnea with effusion.  No ischemia or infarction.  Soft tissue attenuation noted.Marland Kitchen  LVEF 72%.  No RWMA.  LOW RISK.--No change from 11/2017   POLYPECTOMY  02/22/2021   Procedure: POLYPECTOMY;  Surgeon: Jeani Hawking, MD;  Location: [facility];  Service: Endoscopy;;   REPLACEMENT TOTAL KNEE  2017 and 2018   TONSILLECTOMY     TOTAL ABDOMINAL HYSTERECTOMY  1989   TOTAL HIP ARTHROPLASTY  04/2018   TOTAL SHOULDER  ARTHROPLASTY  11/2019   TRANSTHORACIC ECHOCARDIOGRAM  07/04/2019   Normal LV size, mild LVH, hyperdynamic LVEF at >65% with grade 1 diastolic dysfunction.  Otherwise no other significant abnormality.  Poor quality due to patient body habitus.   TRANSTHORACIC ECHOCARDIOGRAM  12/16/2021   [facility]) normal LV size and function.  Moderate concentric LVH.  Normal WM.  EF estimated 59%.  GR 1 DD.  Mild LA dilation.  No valvular lesions.    Allergies  Allergies  Allergen Reactions   Contrast Media [Iodinated Contrast Media] Shortness Of Breath and Other (See Comments)    sob, chest tight   Morphine Itching   Ioversol    Oxycodone Other (See Comments)   Tramadol Itching and Nausea Only    Home Medications    Prior to  Admission medications   Medication Sig Start Date End Date Taking? Authorizing Provider  BD AUTOSHIELD DUO 30G X 5 MM MISC 1 KIT BY MISC ROUTE 3 TIMES DAILY BEFORE MEALS 10/08/20   [provider]  Budeson-Glycopyrrol-Formoterol (BREZTRI AEROSPHERE) 160-9-4.8 MCG/ACT AERO INHALE 2 INHALATIONS BY MOUTH  TWICE DAILY TO PREVENT COUGH OR  WHEEZE. RINSE, GARGLE, AND SPIT  AFTER USE 06/16/22   Nehemiah Settle, FNP  budesonide (PULMICORT) 0.5 MG/2ML nebulizer solution Take 2 mLs (0.5 mg total) by nebulization in the morning and at bedtime. During respiratory infections for 1-2 weeks at a time. 08/20/22   Ellamae Sia, DO  bumetanide (BUMEX) 1 MG tablet TAKE 1 TABLET(1 MG) BY MOUTH DAILY 04/17/22   Yates Decamp, MD  Cholecalciferol 50 MCG (2000 UT) TABS Take 2,000 Units by mouth daily.    [provider]  clopidogrel (PLAVIX) 75 MG tablet TAKE 1 TABLET BY MOUTH DAILY 08/01/22   Yates Decamp, MD  Continuous Blood Gluc Sensor (DEXCOM G6 SENSOR) MISC USE 1 SENSOR AS NEEDED CHANGE EVERY 10 DAYS 01/25/20   [provider]  Continuous Blood Gluc Transmit (DEXCOM G6 TRANSMITTER) MISC  10/17/21   [provider]  diazepam (VALIUM) 5 MG tablet Take  one tablet by mouth with food one hour prior to procedure. May repeat 30 minutes prior if needed. Patient not taking: Reported on 12/09/2022 09/15/22   Juanda Chance, NP  empagliflozin (JARDIANCE) 10 MG TABS tablet Take 1 tablet (10 mg total) by mouth daily before breakfast. 12/16/21   Nori Riis, NP  EPINEPHrine (EPIPEN 2-PAK) 0.3 mg/0.3 mL IJ SOAJ injection Use as directed for severe allergic reactions Patient not taking: Reported on 12/09/2022 03/06/22   Nehemiah Settle, FNP  FASENRA PEN 30 MG/ML SOAJ Inject into the skin. 01/29/22   [provider]  gabapentin (NEURONTIN) 300 MG capsule TAKE 1 CAPSULE BY MOUTH 3 TIMES  DAILY 09/29/22   Zola Button, Yvonne R, DO  glucagon 1 MG injection Inject 1 mg into the muscle once as needed (low blood sugar). 07/30/19   [provider]  glucose 4 GM chewable tablet Chew 1 tablet by mouth once as needed for low blood sugar. Patient not taking: Reported on 12/09/2022 07/30/19   [provider]  HUMALOG 100 UNIT/ML injection Inject into the skin. 06/06/21   [provider]  HUMULIN N 100 UNIT/ML injection Inject 60 Units into the skin 3 (three) times daily before meals. 06/04/20   [provider]  Insulin Disposable Pump (OMNIPOD 5 G6 INTRO, GEN 5,) KIT CHANGE POD EVERY 2 DAYS 01/07/22   [provider]  isosorbide mononitrate (IMDUR) 60 MG 24 hr tablet TAKE 1 TABLET BY MOUTH DAILY 08/01/22   Yates Decamp, MD  levalbuterol [facility] HFA) 45 MCG/ACT inhaler USE 2 INHALATIONS BY MOUTH EVERY 4 TO 6 HOURS AS NEEDED FOR  SHORTNESS OF BREATH , COUGH,  WHEEZE AND TIGHTNESS IN CHEST 07/22/22   Nehemiah Settle, FNP  levalbuterol Pauline Aus) 0.63 MG/3ML nebulizer solution Take 3 mLs (0.63 mg total) by nebulization every 4 (four) hours as needed for wheezing or shortness of breath (coughing fits). Patient not taking: Reported on 12/09/2022 11/24/22   Ellamae Sia, DO  levocetirizine (XYZAL) 5 MG tablet Take 1 tablet (5 mg  total) by mouth every evening. 11/24/22   Ellamae Sia, DO  losartan (COZAAR) 25 MG tablet Take 1 tablet (25 mg total) by mouth daily. 11/06/22   Maisie Fus, MD  metoprolol tartrate (  LOPRESSOR) 50 MG tablet TAKE 1 TABLET BY MOUTH EVERY 12  HOURS 07/11/22   Yates Decamp, MD  montelukast (SINGULAIR) 10 MG tablet TAKE 1 TABLET BY MOUTH AT  BEDTIME 10/14/22   Nehemiah Settle, FNP  nitroGLYCERIN (NITROSTAT) 0.4 MG SL tablet Place 1 tablet (0.4 mg total) under the tongue every 5 (five) minutes as needed for chest pain. Patient not taking: Reported on 12/09/2022 04/19/21   Yates Decamp, MD  OZEMPIC, 2 MG/DOSE, 8 MG/3ML SOPN Inject 2 mg into the skin once a week. 06/05/21   [provider]  pantoprazole (PROTONIX) 40 MG tablet Take 1 tablet (40 mg total) by mouth daily. 09/23/22   Donato Schultz, DO  potassium chloride (KLOR-CON) 10 MEQ tablet Take 1 tablet (10 mEq total) by mouth daily. 12/12/21 12/09/22  Nori Riis, NP  Respiratory Therapy Supplies (CARETOUCH 2 CPAP HOSE HANGER) MISC by Does not apply route.    [provider]  rosuvastatin (CRESTOR) 40 MG tablet TAKE 1 TABLET BY MOUTH AT  BEDTIME 09/29/22   Yates Decamp, MD  silver sulfADIAZINE (SILVADENE) 1 % cream Apply 1 Application topically daily. Patient not taking: Reported on 12/09/2022 03/24/22   Donato Schultz, DO    Physical Exam    Vital Signs:  KAYSHA HILKER does not have vital signs available for review today.  Given telephonic nature of communication, physical exam is limited. AAOx3. NAD. Normal affect.  Speech and respirations are unlabored.  Accessory Clinical Findings    None  Assessment & Plan    1.  Preoperative Cardiovascular Risk Assessment:   Ms. Vea perioperative risk of a major cardiac event is 6.6% according to the Revised Cardiac Risk Index (RCRI).  Therefore, she is at high risk for perioperative complications.   Her functional capacity is fair at 4.4 METs according to the [facility]  Activity Status Index (DASI). Recommendations: According to ACC/AHA guidelines, no further cardiovascular testing needed.  The patient may proceed to surgery at acceptable risk.   Antiplatelet and/or Anticoagulation Recommendations: Clopidogrel (Plavix) can be held for 5-7 days prior to her surgery and resumed as soon as possible post op.    The patient was advised that if she develops new symptoms prior to surgery to contact our office to arrange for a follow-up visit, and she verbalized understanding.    A copy of this note will be routed to requesting surgeon.  Time:   Today, I have spent 11 minutes with the patient with telehealth technology discussing medical history, symptoms, and management plan.     Sharlene Dory, PA-C  12/10/2022, 1:05 PM

## 2022-12-10 NOTE — Progress Notes (Signed)
12/10/2022 Name: Katrina Vega MRN: [id number] DOB: [birth year]  Chief Complaint  Patient presents with   Diabetes    Katrina Vega is a 67 y.o. year old female who presented for a telephone visit.   They were referred to the pharmacist by their Case Management Team  for assistance in managing complex medication management / polypharmacy.    Subjective:  Care Team: Primary Care Provider: Zola Button, Grayling Congress, DO ; Next Scheduled Visit: not currently scheduled Cardiologist: Dr Herbie Baltimore ; Next Scheduled Visit: 05/19/2023 Asthma and Allergy Dr Selena Batten; Next Scheduled Visit: 01/19/2023  Medication Access/Adherence  Current Pharmacy:  [id number], [place] - [address] AT [address]  [place] [address] Phone: [phone] Fax: [phone]  [facility] [place], [place] - [id number] [place] [address] Phone: [phone] Fax: [phone]   Patient reports affordability concerns with their medications: No  - paitent has duel eligibility with [facility] Medicare + Medicaid Patient reports access/transportation concerns to their pharmacy: No  Patient reports adherence concerns with their medications:  No      Diabetes: Managed by Dr Ledon Snare at [facility]. Last office visit was 11/04/22  A1c was 7.5% on 11/04/2022  Current medications: Jardiance 10mg  daily, Ozempic 2mg  weekly, Humalog used in OmniPod insulin pump   Medications tried in the past: Humulin N - still on our med list but patient did endorse that Humulin N was stopped when she started OmniPod pump  Patient feels that Ozempic's effects on appetite suppression has waned.   Current glucose readings: Uses DexCom G6 sensors which connects with OmniPod system. Insulin administration is based on pump setting and adjusts based on blood glucose reading from [facility] sensor and  carbohydrate count patient enters into system.   Patient does have occasional hypoglycemic events. Mrs. Belyeu states she is able to feel when her blood glucose is low. She has glucose tablets and a glucagon kits if needed. She also endorses that her place of work also has kits to  treat hypoglycemia.  Patient denies hyperglycemic symptoms including no polyuria, polydipsia, polyphagia, nocturia, neuropathy, blurred vision.  Annual Eye Exam: per patient was completed this year at [facility] [phone]   Hypertension / CHF  Echo 12/16/2021: EF 59%   Current medications: lisinopril 20mg  daily,, bumetanide 1mg  daily, Jardiance 10mg  daily, metoprolol tartrate 50mg  twice a day   Medications previously tried: losartan is no patients med list but she stopped because it caused headaches. She restarted lisinopril. It was thought that lisinopril could have been worsening her cough. Patient did not feel that cough improved with change and has not worsened since she restarted lisinopril   BP Readings from Last 3 Encounters:  12/09/22 138/68  12/03/22 (!) 154/65  12/02/22 (!) 110/53      Hyperlipidemia/ASCVD Risk Reduction/ CAD:   Current lipid lowering medications: rosuvastatin 40mg  daily at bedtime  Medications tried in the past:   Antiplatelet regimen: clopidogrel Other medication for CAD: isosorbide 60mg  daily, NTG as needed  ASCVD History: PAD, CAD,    Asthma / allergies:  Current Medication: Benralizumab / Fasnra 30mg  SQ every 8 weeks in office, Breztri  2 puffs twice a day; Xopenex HFA inhaler as needed for shortness of breath and wheezing Levocetirizine 5mg  daily, montelukast 10mg  at bedtime   Sick day regimen: budesonide 0.5mg  neb solution twice a day  during respiratory infections for 1 to 2 weeks at a time; levalbuterol / Xopenex nebulizer solution - use in nebulizer as needed for shortness of breath, wheezing or coughing.   Patient reports using rescue  inhaler less than once a week.   Medication Management: Patient will have carpel tunnel revision surgery with plans to wrap the nerve in November. She is meeting with cardiology soon for clearance.   Objective:  Lab Results  Component Value Date   HGBA1C 7.7 (H) 12/20/2021    Lab Results  Component Value Date   CREATININE 1.08 09/23/2022   BUN 10 09/23/2022   NA 138 09/23/2022   K 3.8 09/23/2022   CL 103 09/23/2022   CO2 27 09/23/2022    Lab Results  Component Value Date   CHOL 108 09/04/2022   HDL 29.50 (L) 09/04/2022   LDLCALC 54 09/04/2022   TRIG 121.0 09/04/2022   CHOLHDL 4 09/04/2022    Medications Reviewed Today   Medications were not reviewed in this encounter       Assessment/Plan:   Diabetes: A1c not at goal of < 7.5%  - Continue to follow up with [facility].  - Discussed Ozempic versus Mounjaro. She might see better blood glucose control and weight loss with Mounjaro. Patient to discuss further with endo to see if Greggory Keen would be an appropriate option for her.  - Reviewed goal A1c, goal fasting, and goal 2 hour post prandial glucose - Recommend to continue current diabetes regimen.  - removed Humulin NPH from med list.    Hypertension: Last blood pressure was at goal but prior 2 blood pressure readings were elevated.  - Recommend to continue lisinopril, metoprolol and bumetanide   Hyperlipidemia/ASCVD Risk Reduction / CAD; LDL goal < 55 - Currently controlled.  - Recommend to continue rosuvastatin, isosorbide, clopidogrel.  - Patient to meet with cardio regarding pre op clearance - she will likely need to hold clopidogrel prior to upcoming surgery    Heart Failure:Currently appropriately managed - Recommend to continue current medications for CHF - Jardiance, lisinopril, metoprolol and bumetanide      Asthma / Allergies  - Updated medication list. There are some meds that appear to be duplicates but they are utilized in  different way and at different times depending on if patient is having an exacerbation.    Medication Management: - Reviewed and updated med list - Reviewed refill history and discussed adherence.   Health Maintenance - Called [facility] and requested fax of diabetic eye exam form Feb 2024. .  Follow Up Plan: patient to call if she has questions about medications in future  Henrene Pastor, PharmD Clinical Pharmacist [facility]

## 2022-12-15 ENCOUNTER — Encounter: Payer: Self-pay | Admitting: Hematology & Oncology

## 2022-12-15 NOTE — Procedures (Signed)
Lumbar Facet Joint Intra-Articular Injection(s) with Fluoroscopic Guidance  Patient: Katrina Vega      Date of Birth: 03/20/1955 MRN: [id number] PCP: Donato Schultz, DO      Visit Date: 12/03/2022   Universal Protocol:    Date/Time: 12/03/2022  Consent Given By: the patient  Position: PRONE   Additional Comments: Vital signs were monitored before and after the procedure. Patient was prepped and draped in the usual sterile fashion. The correct patient, procedure, and site was verified.   Injection Procedure Details:  Procedure Site One Meds Administered:  Meds ordered this encounter  Medications   methylPREDNISolone acetate (DEPO-MEDROL) injection 40 mg     Laterality: Bilateral  Location/Site:  L4-L5 L5-S1  Needle size: 22 guage  Needle type: Spinal  Needle Placement: Articular  Findings:  -Comments: Excellent flow of contrast producing a partial arthrogram.  Procedure Details: The fluoroscope beam is vertically oriented in AP, and the inferior recess is visualized beneath the lower pole of the inferior apophyseal process, which represents the target point for needle insertion. When direct visualization is difficult the target point is located at the medial projection of the vertebral pedicle. The region overlying each aforementioned target is locally anesthetized with a 1 to 2 ml. volume of 1% Lidocaine without Epinephrine.   The spinal needle was inserted into each of the above mentioned facet joints using biplanar fluoroscopic guidance. A 0.25 to 0.5 ml. volume of Isovue-250 was injected and a partial facet joint arthrogram was obtained. A single spot film was obtained of the resulting arthrogram.    One to 1.25 ml of the steroid/anesthetic solution was then injected into each of the facet joints noted above.   Additional Comments:  No complications occurred Dressing: 2 x 2 sterile gauze and Band-Aid    Post-procedure details: Patient was observed  during the procedure. Post-procedure instructions were reviewed.  Patient left the clinic in stable condition.

## 2022-12-16 ENCOUNTER — Inpatient Hospital Stay: Payer: 59

## 2022-12-16 VITALS — BP 99/51 | HR 81 | Temp 97.9°F | Resp 20

## 2022-12-16 DIAGNOSIS — D509 Iron deficiency anemia, unspecified: Secondary | ICD-10-CM | POA: Diagnosis not present

## 2022-12-16 MED ORDER — SODIUM CHLORIDE 0.9 % IV SOLN
300.0000 mg | Freq: Once | INTRAVENOUS | Status: AC
  Administered 2022-12-16: 300 mg via INTRAVENOUS
  Filled 2022-12-16: qty 300

## 2022-12-16 MED ORDER — SODIUM CHLORIDE 0.9 % IV SOLN: INTRAVENOUS | Status: DC

## 2022-12-16 NOTE — Patient Instructions (Signed)
Iron Sucrose Injection What is this medication? IRON SUCROSE (EYE ern SOO krose) treats low levels of iron (iron deficiency anemia) in people with kidney disease. Iron is a mineral that plays an important role in making red blood cells, which carry oxygen from your lungs to the rest of your body. This medicine may be used for other purposes; ask your health care provider or pharmacist if you have questions. COMMON BRAND NAME(S): Venofer What should I tell my care team before I take this medication? They need to know if you have any of these conditions: Anemia not caused by low iron levels Heart disease High levels of iron in the blood Kidney disease Liver disease An unusual or allergic reaction to iron, other medications, foods, dyes, or preservatives Pregnant or trying to get pregnant Breastfeeding How should I use this medication? This medication is for infusion into a vein. It is given in a hospital or clinic setting. Talk to your care team about the use of this medication in children. While this medication may be prescribed for children as young as 2 years for selected conditions, precautions do apply. Overdosage: If you think you have taken too much of this medicine contact a poison control center or emergency room at once. NOTE: This medicine is only for you. Do not share this medicine with others. What if I miss a dose? Keep appointments for follow-up doses. It is important not to miss your dose. Call your care team if you are unable to keep an appointment. What may interact with this medication? Do not take this medication with any of the following: Deferoxamine Dimercaprol Other iron products This medication may also interact with the following: Chloramphenicol Deferasirox This list may not describe all possible interactions. Give your health care provider a list of all the medicines, herbs, non-prescription drugs, or dietary supplements you use. Also tell them if you smoke,  drink alcohol, or use illegal drugs. Some items may interact with your medicine. What should I watch for while using this medication? Visit your care team regularly. Tell your care team if your symptoms do not start to get better or if they get worse. You may need blood work done while you are taking this medication. You may need to follow a special diet. Talk to your care team. Foods that contain iron include: whole grains/cereals, dried fruits, beans, or peas, leafy green vegetables, and organ meats (liver, kidney). What side effects may I notice from receiving this medication? Side effects that you should report to your care team as soon as possible: Allergic reactions--skin rash, itching, hives, swelling of the face, lips, tongue, or throat Low blood pressure--dizziness, feeling faint or lightheaded, blurry vision Shortness of breath Side effects that usually do not require medical attention (report to your care team if they continue or are bothersome): Flushing Headache Joint pain Muscle pain Nausea Pain, redness, or irritation at injection site This list may not describe all possible side effects. Call your doctor for medical advice about side effects. You may report side effects to FDA at 1-800-FDA-1088. Where should I keep my medication? This medication is given in a hospital or clinic. It will not be stored at home. NOTE: This sheet is a summary. It may not cover all possible information. If you have questions about this medicine, talk to your doctor, pharmacist, or health care provider.  2024 Elsevier/Gold Standard (2022-07-18 00:00:00)

## 2022-12-19 ENCOUNTER — Ambulatory Visit: Payer: Self-pay

## 2022-12-19 ENCOUNTER — Other Ambulatory Visit: Payer: Self-pay

## 2022-12-19 DIAGNOSIS — R0609 Other forms of dyspnea: Secondary | ICD-10-CM

## 2022-12-19 NOTE — Patient Outreach (Signed)
Care Coordination   Follow Up Visit Note   12/19/2022 Name: Katrina Vega MRN: [id number] DOB: 28-May-1955  [name] is a 67 y.o. year old female who sees Zola Button, Grayling Congress, DO for primary care. I spoke with  [name] by phone today.  What matters to the patients health and wellness today?  Patient reports upcoming surgery scheduled for carpel tunnel revision of right hand on 01/01/23. She reports she continues to keep track of blood sugars. Ms. Carrazana reports her blood sugar at this time after eating is 156. She reports she still needs to have CT to assess adrenal gland. She denies any additional care coordination needs at this time.  Goals Addressed             This Visit's Progress    Health Management education       Interventions Today    Flowsheet Row Most Recent Value  Chronic Disease   Chronic disease during today's visit Diabetes  General Interventions   General Interventions Discussed/Reviewed General Interventions Reviewed, Doctor Visits  [Evaluation of current treatment plan for health condition and patient's adherence to plan.]  Doctor Visits Discussed/Reviewed Doctor Visits Discussed, Doctor Visits Reviewed  Annabell Sabal upcoming appointments]  Education Interventions   Education Provided Provided Education  Provided Verbal Education On Medication, When to see the doctor, Labs  [take medications as prescribed,  attend provider visits as scheduled, advised to contact provider with health questions or concerns]  Labs Reviewed Hgb A1c  Nutrition Interventions   Nutrition Discussed/Reviewed Nutrition Reviewed  Pharmacy Interventions   Pharmacy Dicussed/Reviewed Pharmacy Topics Reviewed            SDOH assessments and interventions completed:  No  Care Coordination Interventions:  Yes, provided   Follow up plan: Follow up call scheduled for 01/15/23    Encounter Outcome:  Patient Visit Completed   Kathyrn Sheriff, RN, MSN, BSN, CCM Care Management  Coordinator [phone]

## 2022-12-19 NOTE — Patient Instructions (Signed)
Visit Information  Thank you for taking time to visit with me today. Please don't hesitate to contact me if I can be of assistance to you.   Following are the goals we discussed today:  Continue to take medications as prescribed. Continue to attend provider visits as scheduled Continue to eat healthy, lean meats, vegetables, fruits, avoid saturated and transfats Continue to check blood sugar as recommended and notify provider if questions or concerns   Our next appointment is by telephone on 01/15/23 at 2:30 pm  Please call the care guide team at [phone] if you need to cancel or reschedule your appointment.   If you are experiencing a Mental Health or Behavioral Health Crisis or need someone to talk to, please call the Suicide and Crisis Lifeline: 988 call the [place] National Suicide Prevention Lifeline: (586)674-1073 or TTY: 714-355-8310 TTY 702-462-4382) to talk to a trained counselor call 1-800-273-TALK (toll free, 24 hour hotline)   Kathyrn Sheriff, RN, MSN, BSN, CCM Care Management Coordinator [phone]

## 2022-12-22 ENCOUNTER — Other Ambulatory Visit: Payer: Self-pay | Admitting: Orthopedic Surgery

## 2022-12-25 ENCOUNTER — Encounter: Payer: Self-pay | Admitting: *Deleted

## 2022-12-25 ENCOUNTER — Other Ambulatory Visit: Payer: Self-pay | Admitting: Cardiology

## 2022-12-25 ENCOUNTER — Telehealth: Payer: Self-pay | Admitting: *Deleted

## 2022-12-25 ENCOUNTER — Other Ambulatory Visit: Payer: Self-pay

## 2022-12-25 ENCOUNTER — Encounter (HOSPITAL_BASED_OUTPATIENT_CLINIC_OR_DEPARTMENT_OTHER): Payer: Self-pay | Admitting: Orthopedic Surgery

## 2022-12-25 DIAGNOSIS — R0609 Other forms of dyspnea: Secondary | ICD-10-CM

## 2022-12-25 DIAGNOSIS — I5032 Chronic diastolic (congestive) heart failure: Secondary | ICD-10-CM

## 2022-12-25 NOTE — Progress Notes (Signed)
   12/25/22 1403  PAT Phone Screen  Is the patient taking a GLP-1 receptor agonist? Yes  Has the patient been informed on holding medication? Yes  Do You Have Diabetes? Yes  Do You Have Hypertension? Yes  Have You Ever Been to the ER for Asthma? No  Have You Taken Oral Steroids in the Past 3 Months? No  Do you Take Phenteramine or any Other Diet Drugs? No  Recent  Lab Work, EKG, CXR? Yes  Where was this test performed? e[facility] 10/2022  Do you have a history of heart problems? Yes  Have you ever had tests on your heart? Yes  What cardiac tests were performed? Echo;EKG;Labs;Stress Test  Results viewable: [facility] Media Tab  Any Recent Hospitalizations? No  Height 5\' 5"  (1.651 m)  Weight 109.8 kg  Pat Appointment Scheduled Yes

## 2022-12-29 ENCOUNTER — Encounter (HOSPITAL_BASED_OUTPATIENT_CLINIC_OR_DEPARTMENT_OTHER)
Admission: RE | Admit: 2022-12-29 | Discharge: 2022-12-29 | Disposition: A | Discharge status: Home / Self Care | Payer: 59 | Source: Ambulatory Visit | Attending: Orthopedic Surgery | Admitting: Orthopedic Surgery

## 2022-12-29 ENCOUNTER — Ambulatory Visit: Payer: 59 | Admitting: *Deleted

## 2022-12-29 DIAGNOSIS — J4550 Severe persistent asthma, uncomplicated: Secondary | ICD-10-CM | POA: Diagnosis not present

## 2022-12-29 DIAGNOSIS — D3502 Benign neoplasm of left adrenal gland: Secondary | ICD-10-CM | POA: Diagnosis not present

## 2022-12-29 DIAGNOSIS — Z01812 Encounter for preprocedural laboratory examination: Secondary | ICD-10-CM | POA: Diagnosis not present

## 2022-12-29 LAB — BASIC METABOLIC PANEL
Anion gap: 12 (ref 5–15)
BUN: 13 mg/dL (ref 8–23)
CO2: 25 mmol/L (ref 22–32)
Calcium: 10 mg/dL (ref 8.9–10.3)
Chloride: 104 mmol/L (ref 98–111)
Creatinine, Ser: 1.1 mg/dL — ABNORMAL HIGH (ref 0.44–1.00)
GFR, Estimated: 55 mL/min — ABNORMAL LOW (ref >60)
Glucose, Bld: 99 mg/dL (ref 70–99)
Potassium: 4.2 mmol/L (ref 3.5–5.1)
Sodium: 141 mmol/L (ref 135–145)

## 2022-12-30 ENCOUNTER — Encounter: Payer: Self-pay | Admitting: Hematology & Oncology

## 2022-12-31 NOTE — Anesthesia Preprocedure Evaluation (Signed)
Anesthesia Evaluation  Patient identified by MRN, date of birth, ID band Patient awake    Reviewed: Allergy & Precautions, NPO status , Patient's Chart, lab work & pertinent test results  Airway Mallampati: II  TM Distance: >3 FB Neck ROM: Full    Dental no notable dental hx. (+) Teeth Intact, Dental Advisory Given   Pulmonary COPD,  COPD inhaler, former smoker, PE   Pulmonary exam normal breath sounds clear to auscultation       Cardiovascular hypertension, Pt. on medications + angina  + CAD, + Cardiac Stents and +CHF (HFpEF)  Normal cardiovascular exam Rhythm:Regular Rate:Normal  NL EF   Neuro/Psych   Anxiety      Neuromuscular disease    GI/Hepatic ,GERD  Medicated,,  Endo/Other  diabetes, Type 2, Insulin Dependent  GLP-1  Renal/GU      Musculoskeletal  (+) Arthritis ,  Chronic back pain w myofascial pain sx    Abdominal  (+) + obese (BMI)  Peds  Hematology   Anesthesia Other Findings   Reproductive/Obstetrics                              Anesthesia Physical Anesthesia Plan  ASA: 3  Anesthesia Plan: General   Post-op Pain Management:    Induction: Intravenous  PONV Risk Score and Plan: Treatment may vary due to age or medical condition, Ondansetron and Propofol infusion  Airway Management Planned: LMA  Additional Equipment: None  Intra-op Plan:   Post-operative Plan: Extubation in OR  Informed Consent: I have reviewed the patients History and Physical, chart, labs and discussed the procedure including the risks, benefits and alternatives for the proposed anesthesia with the patient or authorized representative who has indicated his/her understanding and acceptance.     Dental advisory given  Plan Discussed with: CRNA  Anesthesia Plan Comments:          Anesthesia Quick Evaluation

## 2023-01-01 ENCOUNTER — Ambulatory Visit (HOSPITAL_BASED_OUTPATIENT_CLINIC_OR_DEPARTMENT_OTHER): Payer: 59 | Admitting: Certified Registered"

## 2023-01-01 ENCOUNTER — Encounter (HOSPITAL_BASED_OUTPATIENT_CLINIC_OR_DEPARTMENT_OTHER): Payer: Self-pay | Admitting: Orthopedic Surgery

## 2023-01-01 ENCOUNTER — Ambulatory Visit (HOSPITAL_BASED_OUTPATIENT_CLINIC_OR_DEPARTMENT_OTHER)
Admission: RE | Admit: 2023-01-01 | Discharge: 2023-01-01 | Disposition: A | Discharge status: Home / Self Care | Payer: 59 | Attending: Orthopedic Surgery | Admitting: Orthopedic Surgery

## 2023-01-01 ENCOUNTER — Other Ambulatory Visit: Payer: Self-pay

## 2023-01-01 ENCOUNTER — Ambulatory Visit: Payer: 59 | Admitting: Orthopedic Surgery

## 2023-01-01 ENCOUNTER — Encounter (HOSPITAL_BASED_OUTPATIENT_CLINIC_OR_DEPARTMENT_OTHER)
Admission: RE | Disposition: A | Discharge status: Home / Self Care | Payer: Self-pay | Source: Home / Self Care | Attending: Orthopedic Surgery

## 2023-01-01 ENCOUNTER — Other Ambulatory Visit: Payer: Self-pay | Admitting: Orthopedic Surgery

## 2023-01-01 DIAGNOSIS — G5601 Carpal tunnel syndrome, right upper limb: Secondary | ICD-10-CM | POA: Diagnosis not present

## 2023-01-01 DIAGNOSIS — J4489 Other specified chronic obstructive pulmonary disease: Secondary | ICD-10-CM | POA: Insufficient documentation

## 2023-01-01 DIAGNOSIS — Z955 Presence of coronary angioplasty implant and graft: Secondary | ICD-10-CM | POA: Diagnosis not present

## 2023-01-01 DIAGNOSIS — I5042 Chronic combined systolic (congestive) and diastolic (congestive) heart failure: Secondary | ICD-10-CM | POA: Diagnosis not present

## 2023-01-01 DIAGNOSIS — I25119 Atherosclerotic heart disease of native coronary artery with unspecified angina pectoris: Secondary | ICD-10-CM | POA: Insufficient documentation

## 2023-01-01 DIAGNOSIS — Z794 Long term (current) use of insulin: Secondary | ICD-10-CM | POA: Diagnosis not present

## 2023-01-01 DIAGNOSIS — Z79899 Other long term (current) drug therapy: Secondary | ICD-10-CM | POA: Insufficient documentation

## 2023-01-01 DIAGNOSIS — E1122 Type 2 diabetes mellitus with diabetic chronic kidney disease: Secondary | ICD-10-CM | POA: Diagnosis not present

## 2023-01-01 DIAGNOSIS — Z87891 Personal history of nicotine dependence: Secondary | ICD-10-CM | POA: Insufficient documentation

## 2023-01-01 DIAGNOSIS — N183 Chronic kidney disease, stage 3 unspecified: Secondary | ICD-10-CM | POA: Diagnosis not present

## 2023-01-01 DIAGNOSIS — J4550 Severe persistent asthma, uncomplicated: Secondary | ICD-10-CM | POA: Insufficient documentation

## 2023-01-01 DIAGNOSIS — Z01818 Encounter for other preprocedural examination: Secondary | ICD-10-CM

## 2023-01-01 DIAGNOSIS — I2510 Atherosclerotic heart disease of native coronary artery without angina pectoris: Secondary | ICD-10-CM

## 2023-01-01 DIAGNOSIS — N1832 Chronic kidney disease, stage 3b: Secondary | ICD-10-CM | POA: Insufficient documentation

## 2023-01-01 DIAGNOSIS — I5043 Acute on chronic combined systolic (congestive) and diastolic (congestive) heart failure: Secondary | ICD-10-CM | POA: Diagnosis not present

## 2023-01-01 DIAGNOSIS — Z7985 Long-term (current) use of injectable non-insulin antidiabetic drugs: Secondary | ICD-10-CM | POA: Insufficient documentation

## 2023-01-01 DIAGNOSIS — I130 Hypertensive heart and chronic kidney disease with heart failure and stage 1 through stage 4 chronic kidney disease, or unspecified chronic kidney disease: Secondary | ICD-10-CM | POA: Diagnosis not present

## 2023-01-01 HISTORY — PX: CARPAL TUNNEL RELEASE: SHX101

## 2023-01-01 LAB — GLUCOSE, CAPILLARY
Glucose-Capillary: 135 mg/dL — ABNORMAL HIGH (ref 70–99)
Glucose-Capillary: 140 mg/dL — ABNORMAL HIGH (ref 70–99)

## 2023-01-01 SURGERY — CARPAL TUNNEL RELEASE
Anesthesia: General | Site: Wrist | Laterality: Right

## 2023-01-01 MED ORDER — FENTANYL CITRATE (PF) 100 MCG/2ML IJ SOLN
INTRAMUSCULAR | Status: AC
  Filled 2023-01-01: qty 2

## 2023-01-01 MED ORDER — DEXAMETHASONE SODIUM PHOSPHATE 10 MG/ML IJ SOLN
INTRAMUSCULAR | Status: AC
  Filled 2023-01-01: qty 1

## 2023-01-01 MED ORDER — LACTATED RINGERS IV SOLN
INTRAVENOUS | Status: DC | PRN
Start: 2023-01-01 — End: 2023-01-01

## 2023-01-01 MED ORDER — SUCCINYLCHOLINE CHLORIDE 200 MG/10ML IV SOSY
PREFILLED_SYRINGE | INTRAVENOUS | Status: DC | PRN
  Administered 2023-01-01: 100 mg via INTRAVENOUS

## 2023-01-01 MED ORDER — PHENYLEPHRINE HCL (PRESSORS) 10 MG/ML IV SOLN
INTRAVENOUS | Status: DC | PRN
  Administered 2023-01-01: 160 ug via INTRAVENOUS

## 2023-01-01 MED ORDER — PROPOFOL 10 MG/ML IV BOLUS
INTRAVENOUS | Status: AC
  Filled 2023-01-01: qty 20

## 2023-01-01 MED ORDER — ONDANSETRON HCL 4 MG/2ML IJ SOLN
INTRAMUSCULAR | Status: DC | PRN
  Administered 2023-01-01: 4 mg via INTRAVENOUS

## 2023-01-01 MED ORDER — HYDROCODONE-ACETAMINOPHEN 5-325 MG PO TABS: 1.0000 | ORAL_TABLET | Freq: Four times a day (QID) | ORAL | 0 refills | Status: DC | PRN

## 2023-01-01 MED ORDER — MIDAZOLAM HCL 2 MG/2ML IJ SOLN
INTRAMUSCULAR | Status: AC
  Filled 2023-01-01: qty 2

## 2023-01-01 MED ORDER — HYDROMORPHONE HCL 1 MG/ML IJ SOLN
0.2500 mg | Freq: Once | INTRAMUSCULAR | Status: AC
  Administered 2023-01-01: 0.25 mg via INTRAVENOUS

## 2023-01-01 MED ORDER — LIDOCAINE HCL (CARDIAC) PF 100 MG/5ML IV SOSY
PREFILLED_SYRINGE | INTRAVENOUS | Status: DC | PRN
  Administered 2023-01-01: 100 mg via INTRAVENOUS

## 2023-01-01 MED ORDER — DEXMEDETOMIDINE HCL IN NACL 80 MCG/20ML IV SOLN
INTRAVENOUS | Status: DC | PRN
  Administered 2023-01-01: 4 ug via INTRAVENOUS

## 2023-01-01 MED ORDER — CEFAZOLIN SODIUM-DEXTROSE 2-4 GM/100ML-% IV SOLN
INTRAVENOUS | Status: AC
  Filled 2023-01-01: qty 100

## 2023-01-01 MED ORDER — OXYCODONE HCL 5 MG PO TABS
5.0000 mg | ORAL_TABLET | Freq: Once | ORAL | Status: AC
  Administered 2023-01-01: 5 mg via ORAL

## 2023-01-01 MED ORDER — FENTANYL CITRATE (PF) 100 MCG/2ML IJ SOLN
25.0000 ug | INTRAMUSCULAR | Status: DC | PRN
  Administered 2023-01-01 (×2): 50 ug via INTRAVENOUS

## 2023-01-01 MED ORDER — ONDANSETRON HCL 4 MG/2ML IJ SOLN: 4.0000 mg | Freq: Once | INTRAMUSCULAR | Status: DC | PRN

## 2023-01-01 MED ORDER — PHENYLEPHRINE HCL-NACL 20-0.9 MG/250ML-% IV SOLN
INTRAVENOUS | Status: DC | PRN
Start: 2023-01-01 — End: 2023-01-01
  Administered 2023-01-01: 40 ug/min via INTRAVENOUS

## 2023-01-01 MED ORDER — OXYCODONE HCL 5 MG PO TABS
ORAL_TABLET | ORAL | Status: AC
  Filled 2023-01-01: qty 1

## 2023-01-01 MED ORDER — FENTANYL CITRATE (PF) 100 MCG/2ML IJ SOLN
INTRAMUSCULAR | Status: DC | PRN
  Administered 2023-01-01: 50 ug via INTRAVENOUS

## 2023-01-01 MED ORDER — HYDROMORPHONE HCL 1 MG/ML IJ SOLN
INTRAMUSCULAR | Status: AC
  Filled 2023-01-01: qty 0.5

## 2023-01-01 MED ORDER — ONDANSETRON HCL 4 MG PO TABS
4.0000 mg | ORAL_TABLET | Freq: Once | ORAL | Status: AC
  Administered 2023-01-01: 4 mg via ORAL

## 2023-01-01 MED ORDER — ONDANSETRON HCL 4 MG/2ML IJ SOLN
INTRAMUSCULAR | Status: AC
  Filled 2023-01-01: qty 2

## 2023-01-01 MED ORDER — LACTATED RINGERS IV SOLN: INTRAVENOUS | Status: DC

## 2023-01-01 MED ORDER — LIDOCAINE 2% (20 MG/ML) 5 ML SYRINGE
INTRAMUSCULAR | Status: AC
Start: 2023-01-01 — End: ?
  Filled 2023-01-01: qty 5

## 2023-01-01 MED ORDER — PROPOFOL 10 MG/ML IV BOLUS
INTRAVENOUS | Status: DC | PRN
  Administered 2023-01-01: 160 mg via INTRAVENOUS

## 2023-01-01 MED ORDER — ACETAMINOPHEN 10 MG/ML IV SOLN: 1000.0000 mg | Freq: Once | INTRAVENOUS | Status: DC | PRN

## 2023-01-01 MED ORDER — CEFAZOLIN SODIUM-DEXTROSE 2-4 GM/100ML-% IV SOLN
2.0000 g | INTRAVENOUS | Status: AC
  Administered 2023-01-01: 2 g via INTRAVENOUS

## 2023-01-01 MED ORDER — SODIUM CHLORIDE 0.9 % IV SOLN: INTRAVENOUS | Status: DC | PRN

## 2023-01-01 MED ORDER — DEXAMETHASONE SODIUM PHOSPHATE 10 MG/ML IJ SOLN
INTRAMUSCULAR | Status: DC | PRN
  Administered 2023-01-01: 5 mg via INTRAVENOUS

## 2023-01-01 MED ORDER — ONDANSETRON 4 MG PO TBDP
ORAL_TABLET | ORAL | Status: AC
  Filled 2023-01-01: qty 1

## 2023-01-01 SURGICAL SUPPLY — 47 items
APL PRP STRL LF DISP 70% ISPRP (MISCELLANEOUS) ×1
BLADE MINI RND TIP GREEN BEAV (BLADE) ×2 IMPLANT
BLADE SURG 15 STRL LF DISP TIS (BLADE) ×4 IMPLANT
BLADE SURG 15 STRL SS (BLADE) ×2
BNDG CMPR 5X4 CHSV STRCH STRL (GAUZE/BANDAGES/DRESSINGS) ×1
BNDG CMPR 5X4 KNIT ELC UNQ LF (GAUZE/BANDAGES/DRESSINGS) ×1
BNDG CMPR 75X21 PLY HI ABS (MISCELLANEOUS) ×1
BNDG CMPR 9X4 STRL LF SNTH (GAUZE/BANDAGES/DRESSINGS) ×1
BNDG COHESIVE 4X5 TAN STRL LF (GAUZE/BANDAGES/DRESSINGS) ×2 IMPLANT
BNDG ELASTIC 4INX 5YD STR LF (GAUZE/BANDAGES/DRESSINGS) ×2 IMPLANT
BNDG ESMARK 4X9 LF (GAUZE/BANDAGES/DRESSINGS) ×2 IMPLANT
BNDG GAUZE DERMACEA FLUFF 4 (GAUZE/BANDAGES/DRESSINGS) ×2 IMPLANT
BNDG GZE DERMACEA 4 6PLY (GAUZE/BANDAGES/DRESSINGS) ×1
CHLORAPREP W/TINT 26 (MISCELLANEOUS) ×2 IMPLANT
CORD BIPOLAR FORCEPS 12FT (ELECTRODE) ×2 IMPLANT
COVER BACK TABLE 60X90IN (DRAPES) ×2 IMPLANT
CUFF TOURN SGL QUICK 18X4 (TOURNIQUET CUFF) IMPLANT
DRAPE HAND 75INX146IN 110IN (DRAPES) ×2 IMPLANT
DRAPE SURG 17X23 STRL (DRAPES) ×2 IMPLANT
GAUZE PAD ABD 8X10 STRL (GAUZE/BANDAGES/DRESSINGS) ×2 IMPLANT
GAUZE SPONGE 4X4 12PLY STRL (GAUZE/BANDAGES/DRESSINGS) ×2 IMPLANT
GAUZE STRETCH 2X75IN STRL (MISCELLANEOUS) ×2 IMPLANT
GAUZE XEROFORM 1X8 LF (GAUZE/BANDAGES/DRESSINGS) ×2 IMPLANT
GLOVE BIO SURGEON STRL SZ7.5 (GLOVE) ×2 IMPLANT
GLOVE BIOGEL PI IND STRL 7.5 (GLOVE) ×2 IMPLANT
GOWN STRL REUS W/ TWL LRG LVL3 (GOWN DISPOSABLE) ×4 IMPLANT
GOWN STRL REUS W/TWL LRG LVL3 (GOWN DISPOSABLE) ×2
GOWN STRL REUS W/TWL XL LVL3 (GOWN DISPOSABLE) IMPLANT
GOWN STRL SURGICAL XL XLNG (GOWN DISPOSABLE) ×2 IMPLANT
NDL HYPO 25X5/8 SAFETYGLIDE (NEEDLE) IMPLANT
NEEDLE HYPO 25X5/8 SAFETYGLIDE (NEEDLE)
NS IRRIG 1000ML POUR BTL (IV SOLUTION) IMPLANT
PACK BASIN DAY SURGERY FS (CUSTOM PROCEDURE TRAY) ×2 IMPLANT
PROTCTR NERVE AXOGUARD HA+ 3X6 (Nerve Graft) ×1 IMPLANT
PROTECTOR NRV AXOGUARD HA+ 3X6 (Nerve Graft) IMPLANT
SHEET MEDIUM DRAPE 40X70 STRL (DRAPES) ×2 IMPLANT
SPIKE FLUID TRANSFER (MISCELLANEOUS) IMPLANT
STOCKINETTE IMPERVIOUS 9X36 MD (GAUZE/BANDAGES/DRESSINGS) ×2 IMPLANT
SUCTION TUBE FRAZIER 10FR DISP (SUCTIONS) IMPLANT
SUT ETHILON 4 0 PS 2 18 (SUTURE) ×2 IMPLANT
SUT VIC AB 3-0 SH 27 (SUTURE) ×1
SUT VIC AB 3-0 SH 27X BRD (SUTURE) IMPLANT
SYR BULB EAR ULCER 3OZ GRN STR (SYRINGE) ×4 IMPLANT
SYR CONTROL 10ML LL (SYRINGE) ×2 IMPLANT
TOWEL GREEN STERILE FF (TOWEL DISPOSABLE) ×4 IMPLANT
TUBE CONNECTING 20X1/4 (TUBING) IMPLANT
UNDERPAD 30X36 HEAVY ABSORB (UNDERPADS AND DIAPERS) ×2 IMPLANT

## 2023-01-01 NOTE — Anesthesia Postprocedure Evaluation (Signed)
Anesthesia Post Note  Patient: Katrina Vega  Procedure(s) Performed: RIGHT CARPAL TUNNEL RELEASE REVISION (Right: Wrist)     Patient location during evaluation: PACU Anesthesia Type: General Level of consciousness: awake and alert Pain management: pain level controlled Vital Signs Assessment: post-procedure vital signs reviewed and stable Respiratory status: spontaneous breathing, nonlabored ventilation, respiratory function stable and patient connected to nasal cannula oxygen Cardiovascular status: blood pressure returned to baseline and stable Postop Assessment: no apparent nausea or vomiting Anesthetic complications: no   No notable events documented.  Last Vitals:  Vitals:   01/01/23 1445 01/01/23 1501  BP: 133/60 127/66  Pulse: 74 70  Resp: 18 14  Temp: (!) 36.2 C   SpO2: 96% 96%    Last Pain:  Vitals:   01/01/23 1508  TempSrc:   PainSc: 9                  Trevor Iha

## 2023-01-01 NOTE — Interval H&P Note (Signed)
History and Physical Interval Note:  01/01/2023 12:34 PM  Katrina Vega  has presented today for surgery, with the diagnosis of RIGHT RECURRENT CARPAL TUNNEL SYNDROME.  The various methods of treatment have been discussed with the patient and family. After consideration of risks, benefits and other options for treatment, the patient has consented to  Procedure(s): RIGHT CARPAL TUNNEL RELEASE REVISION (Right) as a surgical intervention.  The patient's history has been reviewed, patient examined, no change in status, stable for surgery.  I have reviewed the patient's chart and labs.  Questions were answered to the patient's satisfaction.     Amyla Heffner

## 2023-01-01 NOTE — Anesthesia Procedure Notes (Signed)
Procedure Name: Intubation Date/Time: 01/01/2023 1:32 PM  Performed by: Thornell Mule, CRNAPre-anesthesia Checklist: Patient identified, Emergency Drugs available, Suction available and Patient being monitored Patient Re-evaluated:Patient Re-evaluated prior to induction Oxygen Delivery Method: Circle system utilized Preoxygenation: Pre-oxygenation with 100% oxygen Induction Type: IV induction Ventilation: Mask ventilation without difficulty Laryngoscope Size: Miller and 3 Grade View: Grade I Tube type: Oral Tube size: 7.0 mm Number of attempts: 1 Airway Equipment and Method: Stylet and Oral airway Placement Confirmation: ETT inserted through vocal cords under direct vision, positive ETCO2 and breath sounds checked- equal and bilateral Secured at: 20 cm Tube secured with: Tape Dental Injury: Teeth and Oropharynx as per pre-operative assessment  Comments: Intubated by Dr Richardson Landry.

## 2023-01-01 NOTE — Transfer of Care (Signed)
Immediate Anesthesia Transfer of Care Note  Patient: Katrina Vega  Procedure(s) Performed: RIGHT CARPAL TUNNEL RELEASE REVISION (Right: Wrist)  Patient Location: PACU  Anesthesia Type:General  Level of Consciousness: drowsy, patient cooperative, and responds to stimulation  Airway & Oxygen Therapy: Patient Spontanous Breathing and Patient connected to face mask oxygen  Post-op Assessment: Report given to RN and Post -op Vital signs reviewed and stable  Post vital signs: Reviewed and stable  Last Vitals:  Vitals Value Taken Time  BP    Temp    Pulse 73 01/01/23 1443  Resp 16 01/01/23 1443  SpO2 96 % 01/01/23 1443  Vitals shown include unfiled device data.  Last Pain:  Vitals:   01/01/23 1117  TempSrc: Temporal  PainSc: 6       Patients Stated Pain Goal: 4 (01/01/23 1117)  Complications: No notable events documented.

## 2023-01-01 NOTE — Anesthesia Procedure Notes (Signed)
Procedure Name: LMA Insertion Date/Time: 01/01/2023 1:18 PM  Performed by: Thornell Mule, CRNAPre-anesthesia Checklist: Patient identified, Emergency Drugs available, Suction available and Patient being monitored Patient Re-evaluated:Patient Re-evaluated prior to induction Oxygen Delivery Method: Circle system utilized Preoxygenation: Pre-oxygenation with 100% oxygen Induction Type: IV induction LMA: LMA inserted LMA Size: 4.0 Number of attempts: 1 Placement Confirmation: positive ETCO2 Tube secured with: Tape Dental Injury: Teeth and Oropharynx as per pre-operative assessment
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
## 2023-01-01 NOTE — Op Note (Addendum)
NAME: Katrina Vega MEDICAL RECORD NO: [id number] DATE OF BIRTH: [birth year] FACILITY: [facility] LOCATION: [facility] PHYSICIAN: Samuella Cota, MD   OPERATIVE REPORT   DATE OF PROCEDURE: 01/01/23    PREOPERATIVE DIAGNOSIS: Right recurrent carpal tunnel syndrome   POSTOPERATIVE DIAGNOSIS: Right recurrent carpal tunnel syndrome   PROCEDURE: Right revision open carpal tunnel release, extended approach  Placement of nerve wrap   SURGEON:  Samuella Cota, M.D.   ASSISTANT: None   ANESTHESIA:  General   INTRAVENOUS FLUIDS:  Per anesthesia flow sheet.   ESTIMATED BLOOD LOSS:  Minimal.   COMPLICATIONS:  None.   SPECIMENS:  none   TOURNIQUET TIME:    Total Tourniquet Time Documented: Upper Arm (Right) - 25 minutes Total: Upper Arm (Right) - 25 minutes    DISPOSITION:  Stable to PACU.   INDICATIONS: This is a 67 year old female who had undergone prior open carpal tunnel release over 10 years prior, who presents to the office with signs and symptoms consistent with recurrent carpal tunnel syndrome.  She had undergone recent nerve study to confirm diagnosis.    Extensive discussion was had the patient regarding her right recurrent carpal tunnel syndrome.  Clinically, she is demonstrating signs and symptoms consistent with recurrent carpal tunnel syndrome.  I reviewed the results of her nerve study which are relatively mild affecting sensory components, however she is clearly demonstrating significant irritation of the nerve clinically.  Given that she did experience relief with the recent injection to the right carpal tunnel, this is a positive indicator for potential revision carpal tunnel surgery.    Given her symptoms refractory to conservative care in the form of bracing and prior injection, she is indicated for right open extended revision carpal tunnel release with possible placement of nerve wrap.  Will wait 3 months from her last injection which was  performed on 8/6 of this year.  Her preference would be to have surgery scheduled for December of this year.   Risks and benefits of the procedure were discussed, risks including but not limited to infection, bleeding, scarring, stiffness, nerve injury, tendon injury, vascular injury, hardware complication if utilized, recurrence of symptoms and need for subsequent operation.  We discussed the nerve wrap as well in particular it is bovine and porcine quality, she does not have any prior reaction to this material or any contraindication to using it.  Patient expressed understanding.  OPERATIVE COURSE: Patient was seen and identified in the preoperative area and marked appropriately.  Surgical consent had been signed. Preoperative IV antibiotic prophylaxis was given. She was transferred to the operating room and placed in supine position with the Right upper extremity on an arm board.  General anesthesia was induced by the anesthesiologist.  Right upper extremity was prepped and draped in normal sterile orthopedic fashion.  A surgical pause was performed between the surgeons, anesthesia, and operating room staff and all were in agreement as to the patient, procedure, and site of procedure.  Tourniquet was placed and padded appropriately to the right upper arm.  Longitudinal incision was designed in the thenar crease in line with the radial border of the ring finger down to level of the distal wrist crease.  Prior incision was utilized.  Incision was carried down utilizing 15 blade.  Incision was then extended across the wrist in [facility] style fashion and into the forearm.  Blunt dissection was performed first at the level of the forearm in order to identify the median nerve.  Median nerve  was carefully dissected at the level of the distal forearm, extending distally to the level of the wrist crease.  Palmar fascia was identified and incised sharply utilizing a Beaver blade.  There was noted to be significant  scarring of the transverse carpal ligament to the underlying median nerve.  Careful dissection was performed down and the transcarpal ligament was identified and separated from the median nerve appropriately. A Beaver blade was then utilized to divide the transcarpal ligament in a distal to proximal fashion. At the level of the wrist crease, skin flaps were elevated to allow for release of the proximal transverse carpal ligament as well as the antebrachial fascia into the forearm. Appropriate decompression was noted of the median nerve, care was taken to protect the nerve in its entirety throughout.  Extensive neurolysis was performed of the median nerve due to the significance of the scar tissue.  Care was taken to protect branches of the median nerve including the palmar cutaneous branch.  There was noted to be significant compression with hourglass deformity of the median nerve at the level of the wrist crease and palmar region.  Once we were satisfied with our proximal and distal dissection and neurolysis, measurements of the nerve were then taken for the nerve wrap.    We selected the Axogen HA 3x6cm nerve wrap to be utilized.  Nerve wrap was then placed in circumferential format around the median nerve at the level of the scar bed underneath the transverse carpal ligament, nerve wrap extended across the wrist crease and into the distal aspect of the forearm in order to provide appropriate protection for the nerve to prevent repeat scarring.  Nerve wrap was placed in circumferential fashion taking care to not place excessive compression around the median nerve.  Tourniquet was deflated at this juncture, no significant compression was noted of the median nerve from the nerve wrap.  Bipolar electrocautery was utilized for hemostasis. Copious irrigation was performed followed by closure utilizing 4-0 nylon in standard fashion. Sterile dressings were applied followed by a short arm volar wrist splint. Patient was  subsequently awoken from anesthesia and transported to the postop recovery unit in stable condition.   Approximately 20 minutes additional time spent beyond that of a normal carpal tunnel release was utilized today for appropriate neurolysis and placement of the nerve wrap.  Significant care was taken to protect the nerve during the extensive neurolysis and prevent repeat scar formation with placement of nerve wrap.  Given the excess scar formation and need for extensive neurolysis, this was 125% above normal in terms of complexity.    Samuella Cota, MD Electronically signed, 01/01/23

## 2023-01-01 NOTE — Discharge Instructions (Addendum)
    Hand Surgery Postop Instructions   Dressings: Maintain postoperative dressing until orthopedic follow-up.  Keep operative site clean and dry until orthopedic follow-up.  Wound Care: Keep your hand elevated above the level of your heart.  Do not allow it to dangle by your side. Moving your fingers is advised to stimulate circulation but will depend on the site of your surgery.  If you have a splint applied, your doctor will advise you regarding movement.  Activity: Do not drive or operate machinery until clearance given from physician. No heavy lifting with operative extremity.  Diet:  Drink liquids today or eat a light diet.  You may resume a regular diet tomorrow.    General expectations: Take prescribed medication if given, transition to over-the-counter medication as quickly as possible. Fingers may become slightly swollen.  Call your doctor if any of the following occur: Severe pain not relieved by pain medication. Elevated temperature. Dressing soaked with blood. Inability to move fingers. White or bluish color to fingers.   Katrina Vega, M.D. Hand Surgery [facility]   Post Anesthesia Home Care Instructions  Activity: Get plenty of rest for the remainder of the day. A responsible individual must stay with you for 24 hours following the procedure.  For the next 24 hours, DO NOT: -Drive a car -Advertising copywriter -Drink alcoholic beverages -Take any medication unless instructed by your physician -Make any legal decisions or sign important papers.  Meals: Start with liquid foods such as gelatin or soup. Progress to regular foods as tolerated. Avoid greasy, spicy, heavy foods. If nausea and/or vomiting occur, drink only clear liquids until the nausea and/or vomiting subsides. Call your physician if vomiting continues.  Special Instructions/Symptoms: Your throat may feel dry or sore from the anesthesia or the breathing tube placed in your  throat during surgery. If this causes discomfort, gargle with warm salt water. The discomfort should disappear within 24 hours.  If you had a scopolamine patch placed behind your ear for the management of post- operative nausea and/or vomiting:  1. The medication in the patch is effective for 72 hours, after which it should be removed.  Wrap patch in a tissue and discard in the trash. Wash hands thoroughly with soap and water. 2. You may remove the patch earlier than 72 hours if you experience unpleasant side effects which may include dry mouth, dizziness or visual disturbances. 3. Avoid touching the patch. Wash your hands with soap and water after contact with the patch.

## 2023-01-02 ENCOUNTER — Encounter (HOSPITAL_BASED_OUTPATIENT_CLINIC_OR_DEPARTMENT_OTHER): Payer: Self-pay | Admitting: Orthopedic Surgery

## 2023-01-05 ENCOUNTER — Ambulatory Visit (INDEPENDENT_AMBULATORY_CARE_PROVIDER_SITE_OTHER): Payer: 59 | Admitting: Orthopedic Surgery

## 2023-01-05 ENCOUNTER — Other Ambulatory Visit (INDEPENDENT_AMBULATORY_CARE_PROVIDER_SITE_OTHER): Payer: 59

## 2023-01-05 VITALS — BP 164/84 | HR 85 | Ht 65.0 in | Wt 250.5 lb

## 2023-01-05 DIAGNOSIS — M5416 Radiculopathy, lumbar region: Secondary | ICD-10-CM | POA: Diagnosis not present

## 2023-01-05 NOTE — Progress Notes (Signed)
Orthopedic Spine Surgery Office Note  Assessment: Patient is a 67 y.o. female with low back pain that radiates into the left buttock and left posterior proximal thigh   Plan: -Patient has tried PT, tylenol, oral steroids, gabapentin, lumbar steroid injections  -Recommended EMG/NCS to evaluate further -I am not sure that patient would benefit from operative intervention.  She has had no improvement with injections which often can be helpful in diagnosis.  She also has a MRI from 2022 that did not show the stenosis that is seen on her 2024 MRI.  However, she was still symptomatic back then as well -Would need a BMI of 40 or less prior to any elective spine surgery -Patient should return to office in 4 weeks, x-rays at next visit: None   Patient expressed understanding of the plan and all questions were answered to the patient's satisfaction.   ___________________________________________________________________________   History:  Patient is a 67 y.o. female who presents today for lumbar spine.  Patient has had several years of low back pain that radiates into the left buttock and left proximal posterior thigh.  She describes the pain as burning in nature.  It has gotten progressively worse with time. She says she feels it with any activity.  She has been limited in what she can do as a result of this pain.  She has tried injections to the L4/5 and L5/S1 areas with Dr. Alvester Morin but said she got 0 relief with those injections.  She sometimes feels the pain going into the right buttock as well but it is not nearly as significant or as frequent as the left side.  There is no trauma or injury that preceded the onset of her pain.   Weakness: Denies Symptoms of imbalance: Denies Paresthesias and numbness: Yes, has numbness and paresthesias in her bilateral feet from neuropathy.  No recent changes.  No new numbness or paresthesias Bowel or bladder incontinence: Denies Saddle anesthesia:  Denies  Treatments tried: PT, tylenol, oral steroids, gabapentin, lumbar steroid injections  Review of systems: Denies fevers and chills, night sweats, unexplained weight loss, history of cancer. Has had pain that wakes her at night  Past medical history: HLD HTN CAD History of MI CHF GERD Neuropathy Diabetes (last A1c was 7.5 on 11/04/2022) COPD OSA Chronic pain CKD  Allergies: IV contrast, morphine, ioversol, oxycodone, tramadol  Past surgical history:  Carpal tunnel release Appendectomy Coronary balloon angioplasty Coronary stent placement Polypectomy Hysterectomy Bilateral TKA Right TSA Left THA  Social history: Denies use of nicotine product (smoking, vaping, patches, smokeless) Alcohol use: denies Denies recreational drug use   Physical Exam:  BMI of 41.7  General: no acute distress, appears stated age Neurologic: alert, answering questions appropriately, following commands Respiratory: unlabored breathing on room air, symmetric chest rise Psychiatric: appropriate affect, normal cadence to speech   MSK (spine):  -Strength exam      Left  Right EHL    5/5  5/5 TA    5/5  5/5 GSC    5/5  5/5 Knee extension  5/5  5/5 Hip flexion   5/5  5/5  -Sensory exam    Sensation intact to light touch in L3-S1 nerve distributions of bilateral lower extremities  -Achilles DTR: 1/4 on the left, 1/4 on the right -Patellar tendon DTR: 1/4 on the left, 1/4 on the right  -Straight leg raise: negative bilaterally -Femoral nerve stretch test: negative bilaterally -Clonus: no beats bilaterally  -Left hip exam: no pain through range of motion,  negative stinchfield, negative faber -Right hip exam: no pain through range of motion, negative stinchfield, negative faber  Imaging: XRs of the lumbar spine from 01/05/2023 were independently reviewed and interpreted, showing no significant degenerative changes. Lordotic alignment. No fracture or dislocation seen. No  evidence of instability on flexion/extension views.   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 No other significant stenosis seen.    Patient name: Katrina Vega Patient MRN: [id number] Date of visit: 01/05/23

## 2023-01-06 ENCOUNTER — Other Ambulatory Visit: Payer: Self-pay

## 2023-01-06 ENCOUNTER — Encounter: Payer: Self-pay | Admitting: Hematology & Oncology

## 2023-01-06 ENCOUNTER — Encounter: Payer: Self-pay | Admitting: Neurology

## 2023-01-06 DIAGNOSIS — R202 Paresthesia of skin: Secondary | ICD-10-CM

## 2023-01-12 ENCOUNTER — Encounter: Payer: Self-pay | Admitting: Hematology & Oncology

## 2023-01-14 ENCOUNTER — Ambulatory Visit (INDEPENDENT_AMBULATORY_CARE_PROVIDER_SITE_OTHER): Payer: 59 | Admitting: Rehabilitative and Restorative Service Providers"

## 2023-01-14 ENCOUNTER — Other Ambulatory Visit: Payer: Self-pay

## 2023-01-14 ENCOUNTER — Ambulatory Visit (INDEPENDENT_AMBULATORY_CARE_PROVIDER_SITE_OTHER): Payer: 59 | Admitting: Orthopedic Surgery

## 2023-01-14 ENCOUNTER — Encounter: Payer: Self-pay | Admitting: Rehabilitative and Restorative Service Providers"

## 2023-01-14 DIAGNOSIS — M25631 Stiffness of right wrist, not elsewhere classified: Secondary | ICD-10-CM | POA: Diagnosis not present

## 2023-01-14 DIAGNOSIS — R600 Localized edema: Secondary | ICD-10-CM | POA: Diagnosis not present

## 2023-01-14 DIAGNOSIS — R278 Other lack of coordination: Secondary | ICD-10-CM | POA: Diagnosis not present

## 2023-01-14 DIAGNOSIS — M25531 Pain in right wrist: Secondary | ICD-10-CM | POA: Diagnosis not present

## 2023-01-14 DIAGNOSIS — M25641 Stiffness of right hand, not elsewhere classified: Secondary | ICD-10-CM | POA: Diagnosis not present

## 2023-01-14 DIAGNOSIS — M6281 Muscle weakness (generalized): Secondary | ICD-10-CM

## 2023-01-14 DIAGNOSIS — G5601 Carpal tunnel syndrome, right upper limb: Secondary | ICD-10-CM

## 2023-01-14 DIAGNOSIS — Z9889 Other specified postprocedural states: Secondary | ICD-10-CM
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
## 2023-01-14 NOTE — Progress Notes (Signed)
   RASHONDA BARROGA - 67 y.o. female MRN [id number]  Date of birth: 01-30-1956  Office Visit Note: Visit Date: 01/14/2023 PCP: Donato Schultz, DO Referred by: Seabron Spates R, *  Subjective:  HPI: KANETRA MCKENNA is a 67 y.o. female who presents today for follow up 2 weeks status post right carpal tunnel release revision with placement of nerve wrap.  She is doing very well overall, pain is controlled, numbness and tingling is improved significantly.  Pertinent ROS were reviewed with the patient and found to be negative unless otherwise specified above in HPI.   Assessment & Plan: Visit Diagnoses: No diagnosis found.  Plan: Sutures removed today.  She will be seen by occupational therapy for fabrication of a removable orthosis and begin range of motion exercises with progression to strengthening as tolerated.  Follow-up in approximate 4 weeks to track progress.  Follow-up: No follow-ups on file.   Meds & Orders: No orders of the defined types were placed in this encounter.  No orders of the defined types were placed in this encounter.    Procedures: No procedures performed       Objective:   Vital Signs: There were no vitals taken for this visit.  Ortho Exam Right wrist: - Well-healing volar incision extending from the palmar aspect to the forearm, skin edges well-approximated, no erythema or drainage - Full digital range of motion without restriction, composite fist - Sensation intact light touch in all distributions - Able to perform thumb abduction without significant discomfort  Imaging: No results found.   Alexsys Eskin Trevor Mace, M.D. [facility] 9:28 AM

## 2023-01-15 ENCOUNTER — Ambulatory Visit: Payer: Self-pay

## 2023-01-15 ENCOUNTER — Encounter: Payer: 59 | Admitting: Rehabilitative and Restorative Service Providers"

## 2023-01-15 NOTE — Patient Instructions (Signed)
Visit Information  Thank you for taking time to visit with me today. Please don't hesitate to contact me if I can be of assistance to you.   Following are the goals we discussed today:  Continue to take medications as prescribed. Continue to attend provider visits as scheduled Continue to eat healthy, lean meats, vegetables, fruits, avoid saturated and transfats Contact provider with health questions or concerns as needed Continue to check blood sugar as recommended and notify provider if questions or concerns Continue to check blood pressure routinely and contact provider if questions or concerns  If you are experiencing a Mental Health or Behavioral Health Crisis or need someone to talk to, please call the Suicide and Crisis Lifeline: 988 call the [place] National Suicide Prevention Lifeline: 317-322-1811 or TTY: 402-381-1367 TTY 325-803-1649) to talk to a trained counselor call 1-800-273-TALK (toll free, 24 hour hotline)  Kathyrn Sheriff, RN, MSN, BSN, CCM Care Management Coordinator [phone]

## 2023-01-15 NOTE — Patient Outreach (Signed)
  Care Coordination   Follow Up Visit Note   01/15/2023 Name: Katrina Vega MRN: [id number] DOB: 12/30/1955  Katrina Vega is a 67 y.o. year old female who sees Zola Button, Grayling Congress, DO for primary care. I spoke with  Katrina Vega by phone today.  What matters to the patients health and wellness today? Ms. Chairs reports she is doing well and is excited about going to visit her sister in [place] tomorrow. Patient reports last office visit with endocrinologist 11/04/22. Per review of chart next visit due in 4 months. Patient request RNCM assist with scheduling 4 month follow up. Carpal Tunnel surgery completed on 01/01/23-Follow up with stitches removed on yesterday. Ms. Dirk reports healing well. RNCM contacted Dr. Guadlupe Spanish office [facility] at [phone] with Reinaldo Berber, who states she will send patient's request to the front desk and they will call patient back. Ms. Sellers is aware and thanked RNCM for assisting. Ms. Hattan denies any questions or concerns. She reports she has all her medications and denies any care management needs at this time. Ms. Shiflett states she will contact RNCM if she has needs in the future.   Goals Addressed             This Visit's Progress    COMPLETED: Health Management education       Interventions Today    Flowsheet Row Most Recent Value  Chronic Disease   Chronic disease during today's visit Diabetes, Chronic Obstructive Pulmonary Disease (COPD), Chronic Kidney Disease/End Stage Renal Disease (ESRD), Hypertension (HTN), Congestive Heart Failure (CHF)  General Interventions   General Interventions Discussed/Reviewed General Interventions Reviewed, Doctor Visits, Communication with, [employer] of current treatment plan for health condition and patient's adherence to plan. Provided contact number for [facility] [phone] and [employer] [phone]]  Doctor Visits Discussed/Reviewed Doctor Visits Reviewed  Communication  with PCP/Specialists  [facility] endocrinologist to request 4 month appointment]  Education Interventions   Education Provided Provided Education  Provided Verbal Education On Blood Sugar Monitoring, When to see the doctor, Medication  [advsied to continue to attend provider visits as recommended/scheduled,  take medications as prescribed,  contact provider with health questions or concerns as needed]  Nutrition Interventions   Nutrition Discussed/Reviewed Nutrition Reviewed  [discussed healthy eating particularly during Holiday season, monitor portion sizes]  Pharmacy Interventions   Pharmacy Dicussed/Reviewed Pharmacy Topics Reviewed  [medications reviewed. reinforced to ensure she takes medications with her on her trip to [place]]            SDOH assessments and interventions completed:  No  Care Coordination Interventions:  Yes, provided   Follow up plan: No further intervention required.   Encounter Outcome:  Patient Visit Completed   Kathyrn Sheriff, RN, MSN, BSN, CCM Care Management Coordinator [phone]

## 2023-01-19 ENCOUNTER — Ambulatory Visit: Payer: 59 | Admitting: Allergy

## 2023-01-26 ENCOUNTER — Encounter: Payer: Self-pay | Admitting: Orthopedic Surgery

## 2023-01-26 ENCOUNTER — Telehealth: Payer: Self-pay

## 2023-01-26 NOTE — Telephone Encounter (Signed)
Sent patient message

## 2023-01-26 NOTE — Telephone Encounter (Signed)
Patient called the triage phone about her incision. She had sent mychart message. She is still on vacation in [place].  I did ask her to please send a photo to you all as well in mychart. I let her know you would respond to her message ASAP.

## 2023-02-02 ENCOUNTER — Encounter (HOSPITAL_BASED_OUTPATIENT_CLINIC_OR_DEPARTMENT_OTHER): Payer: Self-pay | Admitting: Orthopedic Surgery

## 2023-02-02 ENCOUNTER — Encounter (HOSPITAL_BASED_OUTPATIENT_CLINIC_OR_DEPARTMENT_OTHER)
Admission: RE | Admit: 2023-02-02 | Discharge: 2023-02-02 | Disposition: A | Discharge status: Home / Self Care | Payer: 59 | Source: Ambulatory Visit | Attending: Orthopedic Surgery | Admitting: Orthopedic Surgery

## 2023-02-02 ENCOUNTER — Other Ambulatory Visit: Payer: Self-pay

## 2023-02-02 ENCOUNTER — Ambulatory Visit (INDEPENDENT_AMBULATORY_CARE_PROVIDER_SITE_OTHER): Payer: 59 | Admitting: Orthopedic Surgery

## 2023-02-02 DIAGNOSIS — I5030 Unspecified diastolic (congestive) heart failure: Secondary | ICD-10-CM | POA: Diagnosis not present

## 2023-02-02 DIAGNOSIS — M1990 Unspecified osteoarthritis, unspecified site: Secondary | ICD-10-CM | POA: Diagnosis not present

## 2023-02-02 DIAGNOSIS — J449 Chronic obstructive pulmonary disease, unspecified: Secondary | ICD-10-CM | POA: Diagnosis not present

## 2023-02-02 DIAGNOSIS — E119 Type 2 diabetes mellitus without complications: Secondary | ICD-10-CM | POA: Diagnosis not present

## 2023-02-02 DIAGNOSIS — T8131XA Disruption of external operation (surgical) wound, not elsewhere classified, initial encounter: Secondary | ICD-10-CM | POA: Diagnosis not present

## 2023-02-02 DIAGNOSIS — Z9889 Other specified postprocedural states: Secondary | ICD-10-CM

## 2023-02-02 DIAGNOSIS — G709 Myoneural disorder, unspecified: Secondary | ICD-10-CM | POA: Diagnosis not present

## 2023-02-02 DIAGNOSIS — I2510 Atherosclerotic heart disease of native coronary artery without angina pectoris: Secondary | ICD-10-CM | POA: Diagnosis not present

## 2023-02-02 DIAGNOSIS — Z87891 Personal history of nicotine dependence: Secondary | ICD-10-CM | POA: Diagnosis not present

## 2023-02-02 DIAGNOSIS — Z955 Presence of coronary angioplasty implant and graft: Secondary | ICD-10-CM | POA: Diagnosis not present

## 2023-02-02 DIAGNOSIS — Y838 Other surgical procedures as the cause of abnormal reaction of the patient, or of later complication, without mention of misadventure at the time of the procedure: Secondary | ICD-10-CM | POA: Diagnosis not present

## 2023-02-02 DIAGNOSIS — I110 Hypertensive heart disease with heart failure: Secondary | ICD-10-CM | POA: Diagnosis not present

## 2023-02-02 LAB — BASIC METABOLIC PANEL
Anion gap: 10 (ref 5–15)
BUN: 17 mg/dL (ref 8–23)
CO2: 24 mmol/L (ref 22–32)
Calcium: 10.1 mg/dL (ref 8.9–10.3)
Chloride: 106 mmol/L (ref 98–111)
Creatinine, Ser: 1.31 mg/dL — ABNORMAL HIGH (ref 0.44–1.00)
GFR, Estimated: 44 mL/min — ABNORMAL LOW (ref >60)
Glucose, Bld: 191 mg/dL — ABNORMAL HIGH (ref 70–99)
Potassium: 3.8 mmol/L (ref 3.5–5.1)
Sodium: 140 mmol/L (ref 135–145)

## 2023-02-02 NOTE — Progress Notes (Signed)
   02/02/23 1140  PAT Phone Screen  Is the patient taking a GLP-1 receptor agonist? Yes  Has the patient been informed on holding medication? Yes  Do You Have Diabetes? Yes  Do You Have Hypertension? Yes  Have You Ever Been to the ER for Asthma? No  Have You Taken Oral Steroids in the Past 3 Months? No  Do you Take Phenteramine or any Other Diet Drugs? No  Recent  Lab Work, EKG, CXR? (S)  Yes (Echo 10/23, EKG 11/06/22)  Where was this test performed? [facility]  Do you have a history of heart problems? (S)  Yes  Cardiologist Name (S)  Dr. Herbie Baltimore  Have you ever had tests on your heart? (S)  Yes  What cardiac tests were performed? (S)  Echo;EKG;Labs  What date/year were cardiac tests completed? (S)  Echo 10/23, EKG 11/06/22, BMP 12/29/22  Results viewable: [facility] Media Tab  Any Recent Hospitalizations? No  Height 5\' 5"  (1.651 m)  Weight 111.1 kg  Pat Appointment Scheduled Yes (BMP)

## 2023-02-02 NOTE — H&P (View-Only) (Signed)
   CORRA BENCH - 67 y.o. female MRN [id number]  Date of birth: [birth year]  Office Visit Note: Visit Date: 02/02/2023 PCP: Donato Schultz, DO Referred by: Seabron Spates R, *  Subjective:  HPI: LARAYAH SPAKE is a 67 y.o. female who presents today for follow up 4 weeks status post right carpal tunnel release revision with placement of nerve wrap.  She has noted some slight wound dehiscence just proximal to the wrist crease, with ongoing drainage, clear in nature.  Pertinent ROS were reviewed with the patient and found to be negative unless otherwise specified above in HPI.   Assessment & Plan: Visit Diagnoses: No diagnosis found.  Plan: Given that she is roughly 4 weeks postoperatively and is failed to demonstrate appropriate wound healing, we will plan for right wrist irrigation debridement and revision closure.  Surgery will be performed tomorrow in outpatient fashion.  Risks and benefits of the procedure were discussed, risks including but not limited to infection, bleeding, scarring, stiffness, nerve injury, tendon injury, vascular injury, recurrence of symptoms and need for subsequent operation.  Patient expressed understanding.      Follow-up: No follow-ups on file.   Meds & Orders: No orders of the defined types were placed in this encounter.  No orders of the defined types were placed in this encounter.    Procedures: No procedures performed       Objective:   Vital Signs: There were no vitals taken for this visit.  Ortho Exam Right extremity: - Wound spanning from the palmar aspect to the forearm, there is a notable area of wound dehiscence just proximal to wrist crease, approximately 0.5 cm x 0.5 cm, no active drainage on examination today, mild swelling around this region without significant erythema - Digital range of motion is well-preserved - Still with persistent numbness in the median nerve distribution, slowly improving  Imaging: No results  found.   [name] Trevor Mace, M.D. [facility] 9:01 AM

## 2023-02-02 NOTE — Progress Notes (Signed)
   Katrina Vega - 67 y.o. female MRN [id number]  Date of birth: [birth year]  Office Visit Note: Visit Date: 02/02/2023 PCP: Donato Schultz, DO Referred by: Seabron Spates R, *  Subjective:  HPI: Katrina Vega is a 67 y.o. female who presents today for follow up 4 weeks status post right carpal tunnel release revision with placement of nerve wrap.  She has noted some slight wound dehiscence just proximal to the wrist crease, with ongoing drainage, clear in nature.  Pertinent ROS were reviewed with the patient and found to be negative unless otherwise specified above in HPI.   Assessment & Plan: Visit Diagnoses: No diagnosis found.  Plan: Given that she is roughly 4 weeks postoperatively and is failed to demonstrate appropriate wound healing, we will plan for right wrist irrigation debridement and revision closure.  Surgery will be performed tomorrow in outpatient fashion.  Risks and benefits of the procedure were discussed, risks including but not limited to infection, bleeding, scarring, stiffness, nerve injury, tendon injury, vascular injury, recurrence of symptoms and need for subsequent operation.  Patient expressed understanding.      Follow-up: No follow-ups on file.   Meds & Orders: No orders of the defined types were placed in this encounter.  No orders of the defined types were placed in this encounter.    Procedures: No procedures performed       Objective:   Vital Signs: There were no vitals taken for this visit.  Ortho Exam Right extremity: - Wound spanning from the palmar aspect to the forearm, there is a notable area of wound dehiscence just proximal to wrist crease, approximately 0.5 cm x 0.5 cm, no active drainage on examination today, mild swelling around this region without significant erythema - Digital range of motion is well-preserved - Still with persistent numbness in the median nerve distribution, slowly improving  Imaging: No results  found.   Katrina Vega Trevor Mace, M.D. [facility] 9:01 AM

## 2023-02-02 NOTE — Progress Notes (Signed)

## 2023-02-03 ENCOUNTER — Encounter (HOSPITAL_BASED_OUTPATIENT_CLINIC_OR_DEPARTMENT_OTHER)
Admission: RE | Disposition: A | Discharge status: Home / Self Care | Payer: Self-pay | Source: Ambulatory Visit | Attending: Orthopedic Surgery

## 2023-02-03 ENCOUNTER — Other Ambulatory Visit: Payer: Self-pay | Admitting: Orthopedic Surgery

## 2023-02-03 ENCOUNTER — Encounter (HOSPITAL_BASED_OUTPATIENT_CLINIC_OR_DEPARTMENT_OTHER): Payer: Self-pay | Admitting: Orthopedic Surgery

## 2023-02-03 ENCOUNTER — Ambulatory Visit (HOSPITAL_BASED_OUTPATIENT_CLINIC_OR_DEPARTMENT_OTHER)
Admission: RE | Admit: 2023-02-03 | Discharge: 2023-02-03 | Disposition: A | Discharge status: Home / Self Care | Payer: 59 | Source: Ambulatory Visit | Attending: Orthopedic Surgery | Admitting: Orthopedic Surgery

## 2023-02-03 ENCOUNTER — Ambulatory Visit (HOSPITAL_BASED_OUTPATIENT_CLINIC_OR_DEPARTMENT_OTHER): Payer: 59 | Admitting: Anesthesiology

## 2023-02-03 DIAGNOSIS — Z955 Presence of coronary angioplasty implant and graft: Secondary | ICD-10-CM | POA: Insufficient documentation

## 2023-02-03 DIAGNOSIS — M1990 Unspecified osteoarthritis, unspecified site: Secondary | ICD-10-CM | POA: Insufficient documentation

## 2023-02-03 DIAGNOSIS — T8130XA Disruption of wound, unspecified, initial encounter: Secondary | ICD-10-CM | POA: Diagnosis not present

## 2023-02-03 DIAGNOSIS — I110 Hypertensive heart disease with heart failure: Secondary | ICD-10-CM | POA: Insufficient documentation

## 2023-02-03 DIAGNOSIS — I2510 Atherosclerotic heart disease of native coronary artery without angina pectoris: Secondary | ICD-10-CM | POA: Diagnosis not present

## 2023-02-03 DIAGNOSIS — I5030 Unspecified diastolic (congestive) heart failure: Secondary | ICD-10-CM | POA: Diagnosis not present

## 2023-02-03 DIAGNOSIS — I130 Hypertensive heart and chronic kidney disease with heart failure and stage 1 through stage 4 chronic kidney disease, or unspecified chronic kidney disease: Secondary | ICD-10-CM | POA: Diagnosis not present

## 2023-02-03 DIAGNOSIS — S61501A Unspecified open wound of right wrist, initial encounter: Secondary | ICD-10-CM | POA: Diagnosis not present

## 2023-02-03 DIAGNOSIS — J449 Chronic obstructive pulmonary disease, unspecified: Secondary | ICD-10-CM | POA: Insufficient documentation

## 2023-02-03 DIAGNOSIS — G709 Myoneural disorder, unspecified: Secondary | ICD-10-CM | POA: Insufficient documentation

## 2023-02-03 DIAGNOSIS — T8131XA Disruption of external operation (surgical) wound, not elsewhere classified, initial encounter: Secondary | ICD-10-CM | POA: Insufficient documentation

## 2023-02-03 DIAGNOSIS — T81328A Disruption or dehiscence of closure of other specified internal operation (surgical) wound, initial encounter: Secondary | ICD-10-CM | POA: Diagnosis not present

## 2023-02-03 DIAGNOSIS — Y838 Other surgical procedures as the cause of abnormal reaction of the patient, or of later complication, without mention of misadventure at the time of the procedure: Secondary | ICD-10-CM | POA: Insufficient documentation

## 2023-02-03 DIAGNOSIS — E1165 Type 2 diabetes mellitus with hyperglycemia: Secondary | ICD-10-CM

## 2023-02-03 DIAGNOSIS — E119 Type 2 diabetes mellitus without complications: Secondary | ICD-10-CM | POA: Insufficient documentation

## 2023-02-03 DIAGNOSIS — G5601 Carpal tunnel syndrome, right upper limb: Secondary | ICD-10-CM

## 2023-02-03 DIAGNOSIS — Z87891 Personal history of nicotine dependence: Secondary | ICD-10-CM | POA: Insufficient documentation

## 2023-02-03 DIAGNOSIS — N183 Chronic kidney disease, stage 3 unspecified: Secondary | ICD-10-CM | POA: Diagnosis not present

## 2023-02-03 DIAGNOSIS — I5043 Acute on chronic combined systolic (congestive) and diastolic (congestive) heart failure: Secondary | ICD-10-CM | POA: Diagnosis not present

## 2023-02-03 LAB — GLUCOSE, CAPILLARY
Glucose-Capillary: 148 mg/dL — ABNORMAL HIGH (ref 70–99)
Glucose-Capillary: 121 mg/dL — ABNORMAL HIGH (ref 70–99)

## 2023-02-03 SURGERY — INCISION AND DRAINAGE OF DEEP ABSCESS, WRIST
Anesthesia: Monitor Anesthesia Care | Site: Wrist | Laterality: Right

## 2023-02-03 MED ORDER — PHENYLEPHRINE 80 MCG/ML (10ML) SYRINGE FOR IV PUSH (FOR BLOOD PRESSURE SUPPORT)
PREFILLED_SYRINGE | INTRAVENOUS | Status: DC | PRN
  Administered 2023-02-03: 160 ug via INTRAVENOUS

## 2023-02-03 MED ORDER — LACTATED RINGERS IV SOLN: INTRAVENOUS | Status: DC

## 2023-02-03 MED ORDER — FENTANYL CITRATE (PF) 100 MCG/2ML IJ SOLN
INTRAMUSCULAR | Status: AC
  Filled 2023-02-03: qty 2

## 2023-02-03 MED ORDER — BUPIVACAINE HCL 0.25 % IJ SOLN
INTRAMUSCULAR | Status: DC | PRN
  Administered 2023-02-03: 10 mL

## 2023-02-03 MED ORDER — FENTANYL CITRATE (PF) 100 MCG/2ML IJ SOLN
INTRAMUSCULAR | Status: DC | PRN
  Administered 2023-02-03: 100 ug via INTRAVENOUS
  Administered 2023-02-03 (×2): 25 ug via INTRAVENOUS
  Administered 2023-02-03: 50 ug via INTRAVENOUS

## 2023-02-03 MED ORDER — MIDAZOLAM HCL 2 MG/2ML IJ SOLN
INTRAMUSCULAR | Status: DC | PRN
  Administered 2023-02-03: 2 mg via INTRAVENOUS

## 2023-02-03 MED ORDER — OXYCODONE HCL 5 MG PO TABS
5.0000 mg | ORAL_TABLET | Freq: Once | ORAL | Status: AC | PRN
  Administered 2023-02-03: 5 mg via ORAL

## 2023-02-03 MED ORDER — AMISULPRIDE (ANTIEMETIC) 5 MG/2ML IV SOLN
INTRAVENOUS | Status: AC
  Filled 2023-02-03: qty 4

## 2023-02-03 MED ORDER — ACETAMINOPHEN 160 MG/5ML PO SOLN: 325.0000 mg | ORAL | Status: DC | PRN

## 2023-02-03 MED ORDER — DEXAMETHASONE SODIUM PHOSPHATE 10 MG/ML IJ SOLN
INTRAMUSCULAR | Status: AC
  Filled 2023-02-03: qty 1

## 2023-02-03 MED ORDER — OXYCODONE HCL 5 MG/5ML PO SOLN: 5.0000 mg | Freq: Once | ORAL | Status: AC | PRN

## 2023-02-03 MED ORDER — ACETAMINOPHEN 325 MG PO TABS: 325.0000 mg | ORAL_TABLET | ORAL | Status: DC | PRN

## 2023-02-03 MED ORDER — CEFAZOLIN SODIUM-DEXTROSE 2-3 GM-%(50ML) IV SOLR
INTRAVENOUS | Status: DC | PRN
  Administered 2023-02-03: 2 g via INTRAVENOUS

## 2023-02-03 MED ORDER — CELECOXIB 200 MG PO CAPS
ORAL_CAPSULE | ORAL | Status: AC
  Filled 2023-02-03: qty 1

## 2023-02-03 MED ORDER — PROPOFOL 10 MG/ML IV BOLUS
INTRAVENOUS | Status: AC
  Filled 2023-02-03: qty 20

## 2023-02-03 MED ORDER — LIDOCAINE 2% (20 MG/ML) 5 ML SYRINGE
INTRAMUSCULAR | Status: AC
  Filled 2023-02-03: qty 5

## 2023-02-03 MED ORDER — MEPERIDINE HCL 25 MG/ML IJ SOLN: 6.2500 mg | INTRAMUSCULAR | Status: DC | PRN

## 2023-02-03 MED ORDER — LACTATED RINGERS IV SOLN: INTRAVENOUS | Status: DC | PRN

## 2023-02-03 MED ORDER — CEFAZOLIN SODIUM-DEXTROSE 2-4 GM/100ML-% IV SOLN
INTRAVENOUS | Status: AC
  Filled 2023-02-03: qty 100

## 2023-02-03 MED ORDER — LIDOCAINE 2% (20 MG/ML) 5 ML SYRINGE
INTRAMUSCULAR | Status: DC | PRN
  Administered 2023-02-03: 60 mg via INTRAVENOUS

## 2023-02-03 MED ORDER — CELECOXIB 200 MG PO CAPS
200.0000 mg | ORAL_CAPSULE | Freq: Once | ORAL | Status: AC
  Administered 2023-02-03: 200 mg via ORAL

## 2023-02-03 MED ORDER — ONDANSETRON HCL 4 MG/2ML IJ SOLN
INTRAMUSCULAR | Status: DC | PRN
  Administered 2023-02-03: 4 mg via INTRAVENOUS

## 2023-02-03 MED ORDER — OXYCODONE HCL 5 MG PO TABS
ORAL_TABLET | ORAL | Status: AC
  Filled 2023-02-03: qty 1

## 2023-02-03 MED ORDER — ONDANSETRON HCL 4 MG/2ML IJ SOLN: 4.0000 mg | Freq: Once | INTRAMUSCULAR | Status: DC | PRN

## 2023-02-03 MED ORDER — 0.9 % SODIUM CHLORIDE (POUR BTL) OPTIME
TOPICAL | Status: DC | PRN
  Administered 2023-02-03: 400 mL

## 2023-02-03 MED ORDER — BUPIVACAINE HCL (PF) 0.25 % IJ SOLN
INTRAMUSCULAR | Status: AC
  Filled 2023-02-03: qty 30

## 2023-02-03 MED ORDER — MIDAZOLAM HCL 2 MG/2ML IJ SOLN
INTRAMUSCULAR | Status: AC
  Filled 2023-02-03: qty 2

## 2023-02-03 MED ORDER — FENTANYL CITRATE (PF) 100 MCG/2ML IJ SOLN
25.0000 ug | INTRAMUSCULAR | Status: DC | PRN
  Administered 2023-02-03 (×2): 50 ug via INTRAVENOUS

## 2023-02-03 MED ORDER — ACETAMINOPHEN 500 MG PO TABS
ORAL_TABLET | ORAL | Status: AC
  Filled 2023-02-03: qty 2

## 2023-02-03 MED ORDER — CEPHALEXIN 250 MG PO CAPS: 250.0000 mg | ORAL_CAPSULE | Freq: Four times a day (QID) | ORAL | 0 refills | Status: DC

## 2023-02-03 MED ORDER — ONDANSETRON HCL 4 MG/2ML IJ SOLN
INTRAMUSCULAR | Status: AC
  Filled 2023-02-03: qty 2

## 2023-02-03 MED ORDER — PROPOFOL 10 MG/ML IV BOLUS
INTRAVENOUS | Status: DC | PRN
  Administered 2023-02-03: 200 mg via INTRAVENOUS

## 2023-02-03 MED ORDER — CEFAZOLIN SODIUM-DEXTROSE 2-4 GM/100ML-% IV SOLN: 2.0000 g | INTRAVENOUS | Status: DC

## 2023-02-03 MED ORDER — EPHEDRINE SULFATE-NACL 50-0.9 MG/10ML-% IV SOSY
PREFILLED_SYRINGE | INTRAVENOUS | Status: DC | PRN
  Administered 2023-02-03: 5 mg via INTRAVENOUS

## 2023-02-03 MED ORDER — AMISULPRIDE (ANTIEMETIC) 5 MG/2ML IV SOLN
10.0000 mg | Freq: Once | INTRAVENOUS | Status: AC
  Administered 2023-02-03: 10 mg via INTRAVENOUS

## 2023-02-03 MED ORDER — ACETAMINOPHEN 500 MG PO TABS
1000.0000 mg | ORAL_TABLET | Freq: Once | ORAL | Status: AC
  Administered 2023-02-03: 1000 mg via ORAL

## 2023-02-03 MED ORDER — HYDROCODONE-ACETAMINOPHEN 5-325 MG PO TABS: 1.0000 | ORAL_TABLET | Freq: Four times a day (QID) | ORAL | 0 refills | Status: DC | PRN

## 2023-02-03 SURGICAL SUPPLY — 38 items
APPLICATOR CHLORAPREP 3ML ORNG (MISCELLANEOUS) ×2 IMPLANT
BLADE ARTHRO LOK 4 BEAVER (BLADE) IMPLANT
BLADE SURG 15 STRL LF DISP TIS (BLADE) ×4 IMPLANT
BNDG COHESIVE 4X5 TAN STRL LF (GAUZE/BANDAGES/DRESSINGS) ×2 IMPLANT
BNDG ELASTIC 3INX 5YD STR LF (GAUZE/BANDAGES/DRESSINGS) IMPLANT
BNDG ELASTIC 4INX 5YD STR LF (GAUZE/BANDAGES/DRESSINGS) ×2 IMPLANT
BNDG ESMARK 4X9 LF (GAUZE/BANDAGES/DRESSINGS) ×2 IMPLANT
BNDG GAUZE DERMACEA FLUFF 4 (GAUZE/BANDAGES/DRESSINGS) ×2 IMPLANT
CHLORAPREP W/TINT 26 (MISCELLANEOUS) ×2 IMPLANT
CORD BIPOLAR FORCEPS 12FT (ELECTRODE) ×2 IMPLANT
COVER BACK TABLE 60X90IN (DRAPES) ×2 IMPLANT
CUFF TOURN SGL QUICK 18X4 (TOURNIQUET CUFF) IMPLANT
DRAPE HAND 75INX146IN 110IN (DRAPES) ×2 IMPLANT
DRAPE OEC MINIVIEW 54X84 (DRAPES) ×2 IMPLANT
DRAPE SURG 17X23 STRL (DRAPES) ×2 IMPLANT
DRSG TELFA 3X8 NADH STRL (GAUZE/BANDAGES/DRESSINGS) IMPLANT
GAUZE SPONGE 4X4 12PLY STRL (GAUZE/BANDAGES/DRESSINGS) ×2 IMPLANT
GAUZE STRETCH 2X75IN STRL (MISCELLANEOUS) ×2 IMPLANT
GAUZE XEROFORM 1X8 LF (GAUZE/BANDAGES/DRESSINGS) ×2 IMPLANT
GLOVE BIO SURGEON STRL SZ7.5 (GLOVE) ×2 IMPLANT
GLOVE BIOGEL PI IND STRL 7.5 (GLOVE) ×2 IMPLANT
GOWN STRL REUS W/ TWL LRG LVL3 (GOWN DISPOSABLE) ×4 IMPLANT
GOWN STRL REUS W/TWL XL LVL3 (GOWN DISPOSABLE) IMPLANT
GOWN STRL SURGICAL XL XLNG (GOWN DISPOSABLE) ×2 IMPLANT
NDL HYPO 25X5/8 SAFETYGLIDE (NEEDLE) IMPLANT
NEEDLE HYPO 25X5/8 SAFETYGLIDE (NEEDLE)
NS IRRIG 1000ML POUR BTL (IV SOLUTION) IMPLANT
PACK BASIN DAY SURGERY FS (CUSTOM PROCEDURE TRAY) ×2 IMPLANT
SHEET MEDIUM DRAPE 40X70 STRL (DRAPES) ×2 IMPLANT
SPIKE FLUID TRANSFER (MISCELLANEOUS) IMPLANT
STOCKINETTE IMPERVIOUS 9X36 MD (GAUZE/BANDAGES/DRESSINGS) IMPLANT
SUCTION TUBE FRAZIER 10FR DISP (SUCTIONS) IMPLANT
SUT ETHILON 4 0 PS 2 18 (SUTURE) IMPLANT
SUT MNCRL AB 4-0 PS2 18 (SUTURE) ×2 IMPLANT
SYR BULB EAR ULCER 3OZ GRN STR (SYRINGE) ×4 IMPLANT
SYR CONTROL 10ML LL (SYRINGE) IMPLANT
TOWEL GREEN STERILE FF (TOWEL DISPOSABLE) ×4 IMPLANT
TUBE CONNECTING 20X1/4 (TUBING) IMPLANT

## 2023-02-03 NOTE — Transfer of Care (Signed)
Immediate Anesthesia Transfer of Care Note  Patient: Katrina Vega  Procedure(s) Performed: RIGHT WRIST INCISION AND DRAINAGE OF DEEP ABSCESS CLOSURE (Right: Wrist)  Patient Location: PACU  Anesthesia Type:General  Level of Consciousness: awake, alert , and oriented  Airway & Oxygen Therapy: Patient Spontanous Breathing and Patient connected to face mask oxygen  Post-op Assessment: Report given to RN and Post -op Vital signs reviewed and stable  Post vital signs: Reviewed and stable  Last Vitals:  Vitals Value Taken Time  BP 174/76 02/03/23 1219  Temp    Pulse 81 02/03/23 1221  Resp 21 02/03/23 1221  SpO2 97 % 02/03/23 1221  Vitals shown include unfiled device data.  Last Pain:  Vitals:   02/03/23 0951  TempSrc: Temporal  PainSc: 4       Patients Stated Pain Goal: 0 (02/03/23 0951)  Complications: No notable events documented.

## 2023-02-03 NOTE — Anesthesia Preprocedure Evaluation (Addendum)
Anesthesia Evaluation  Patient identified by MRN, date of birth, ID band Patient awake    Reviewed: Allergy & Precautions, NPO status , Patient's Chart, lab work & pertinent test results  Airway Mallampati: II  TM Distance: >3 FB Neck ROM: Full    Dental no notable dental hx. (+) Dental Advisory Given, Edentulous Upper, Poor Dentition, Missing,    Pulmonary COPD,  COPD inhaler, former smoker, PE   Pulmonary exam normal breath sounds clear to auscultation       Cardiovascular hypertension, Pt. on medications and Pt. on home beta blockers + angina  + CAD, + Cardiac Stents and +CHF (HFpEF)  Normal cardiovascular exam Rhythm:Regular Rate:Normal  NL EF   Neuro/Psych   Anxiety      Neuromuscular disease    GI/Hepatic ,GERD  Medicated,,  Endo/Other  diabetes, Type 2, Insulin Dependent  GLP-1  Renal/GU      Musculoskeletal  (+) Arthritis ,  Chronic back pain w myofascial pain sx    Abdominal  (+) + obese (BMI)  Peds  Hematology  (+) Blood dyscrasia   Anesthesia Other Findings   Reproductive/Obstetrics                             Anesthesia Physical Anesthesia Plan  ASA: 3  Anesthesia Plan: General   Post-op Pain Management: Minimal or no pain anticipated, Tylenol PO (pre-op)* and Celebrex PO (pre-op)*   Induction: Intravenous  PONV Risk Score and Plan: 2 and Treatment may vary due to age or medical condition, Ondansetron and Propofol infusion  Airway Management Planned: LMA  Additional Equipment: None  Intra-op Plan:   Post-operative Plan: Extubation in OR  Informed Consent: I have reviewed the patients History and Physical, chart, labs and discussed the procedure including the risks, benefits and alternatives for the proposed anesthesia with the patient or authorized representative who has indicated his/her understanding and acceptance.     Dental advisory given  Plan Discussed  with: CRNA and Anesthesiologist  Anesthesia Plan Comments: ( )        Anesthesia Quick Evaluation

## 2023-02-03 NOTE — Discharge Instructions (Addendum)
    Hand Surgery Postop Instructions   Dressings: Maintain postoperative dressing until orthopedic follow-up.  Keep operative site clean and dry until orthopedic follow-up.  Wound Care: Keep your hand elevated above the level of your heart.  Do not allow it to dangle by your side. Moving your fingers is advised to stimulate circulation but will depend on the site of your surgery.  If you have a splint applied, your doctor will advise you regarding movement.  Activity: Do not drive or operate machinery until clearance given from physician. No heavy lifting with operative extremity.  Diet:  Drink liquids today or eat a light diet.  You may resume a regular diet tomorrow.    General expectations: Take prescribed medication if given, transition to over-the-counter medication as quickly as possible. Fingers may become slightly swollen.  Call your doctor if any of the following occur: Severe pain not relieved by pain medication. Elevated temperature. Dressing soaked with blood. Inability to move fingers. White or bluish color to fingers.   Per [facility] clinic policy, our goal is ensure optimal postoperative pain control with a multimodal pain management strategy. For all [facility] patients, our goal is to wean post-operative narcotic medications by 6 weeks post-operatively. If this is not possible due to utilization of pain medication prior to surgery, your [facility] doctor will support your acute post-operative pain control for the first 6 weeks postoperatively, with a plan to transition you back to your primary pain team following that. Cyndia Skeeters will work to ensure a Therapist, occupational.  Abryana Lykens Trevor Mace, M.D. Hand [facility]    No Tylenol or Ibuprofen until after 4pm today if needed.   Post Anesthesia Home Care Instructions  Activity: Get plenty of rest for the remainder of the day. A responsible individual must stay with you for 24 hours following the  procedure.  For the next 24 hours, DO NOT: -Drive a car -Advertising copywriter -Drink alcoholic beverages -Take any medication unless instructed by your physician -Make any legal decisions or sign important papers.  Meals: Start with liquid foods such as gelatin or soup. Progress to regular foods as tolerated. Avoid greasy, spicy, heavy foods. If nausea and/or vomiting occur, drink only clear liquids until the nausea and/or vomiting subsides. Call your physician if vomiting continues.  Special Instructions/Symptoms: Your throat may feel dry or sore from the anesthesia or the breathing tube placed in your throat during surgery. If this causes discomfort, gargle with warm salt water. The discomfort should disappear within 24 hours.  If you had a scopolamine patch placed behind your ear for the management of post- operative nausea and/or vomiting:  1. The medication in the patch is effective for 72 hours, after which it should be removed.  Wrap patch in a tissue and discard in the trash. Wash hands thoroughly with soap and water. 2. You may remove the patch earlier than 72 hours if you experience unpleasant side effects which may include dry mouth, dizziness or visual disturbances. 3. Avoid touching the patch. Wash your hands with soap and water after contact with the patch.

## 2023-02-03 NOTE — Interval H&P Note (Signed)
History and Physical Interval Note:  02/03/2023 9:39 AM  Katrina Vega  has presented today for surgery, with the diagnosis of RIGHT WRIST S/P CARPAL TUNNEL RELEASE WOUND DEHEHISCENSE.  The various methods of treatment have been discussed with the patient and family. After consideration of risks, benefits and other options for treatment, the patient has consented to  Procedure(s): RIGHT WRIST INCISION AND DRAINAGE OF DEEP ABSCESS CLOSURE (Right) as a surgical intervention.  The patient's history has been reviewed, patient examined, no change in status, stable for surgery.  I have reviewed the patient's chart and labs.  Questions were answered to the patient's satisfaction.     [name]

## 2023-02-03 NOTE — Op Note (Signed)
NAME: Katrina Vega MEDICAL RECORD NO: [id number] DATE OF BIRTH: [birth year] FACILITY: [facility] LOCATION: [facility] PHYSICIAN: Samuella Cota, MD   OPERATIVE REPORT   DATE OF PROCEDURE: 02/03/23    PREOPERATIVE DIAGNOSIS: Right wrist delayed wound healing, wound dehiscence   POSTOPERATIVE DIAGNOSIS: Right wrist delayed wound healing, wound dehiscence   PROCEDURE: Right wrist irrigation and debridement with subsequent closure   SURGEON:  Samuella Cota, M.D.   ASSISTANT: Glynn Octave, OPA   ANESTHESIA:  General   INTRAVENOUS FLUIDS:  Per anesthesia flow sheet.   ESTIMATED BLOOD LOSS:  Minimal.   COMPLICATIONS:  None.   SPECIMENS:  none   TOURNIQUET TIME:    Total Tourniquet Time Documented: Upper Arm (Right) - 42 minutes Total: Upper Arm (Right) - 42 minutes    DISPOSITION:  Stable to PACU.   INDICATIONS:   67 y.o. female 4 weeks status post right carpal tunnel open extended release revision with placement of nerve wrap.  She has noted some slight wound dehiscence just proximal to the wrist crease, with ongoing drainage, clear in nature.  Given that she is roughly 4 weeks postoperatively and is failed to demonstrate appropriate wound healing, she was indicated for right wrist irrigation debridement and revision closure.  Risks and benefits of surgery were discussed including the risks of infection, bleeding, scarring, stiffness, nerve injury, vascular injury, tendon injury, need for subsequent operation, , need for repeat irrigation and debridement.  She voiced understanding of these risks and elected to proceed.  OPERATIVE COURSE: Patient was seen and identified in the preoperative area and marked appropriately.  Surgical consent had been signed. Preoperative IV antibiotic prophylaxis was given. She was transferred to the operating room and placed in supine position with the Right upper extremity on an arm board.  General anesthesia was induced by the  anesthesiologist.  Right upper extremity was prepped and draped in normal sterile orthopedic fashion.  A surgical pause was performed between the surgeons, anesthesia, and operating room staff and all were in agreement as to the patient, procedure, and site of procedure.  Tourniquet was placed and padded appropriately to the right upper arm.  The arm was exsanguinated, the tourniquet was inflated to 250 mmHg.  Prior longitudinal incision was reopened sharply utilizing a 15 blade at the level of the wrist crease, extending into the palmar aspect of the hand and to the forearm.  Careful dissection was performed down.  We identified the median nerve with the nerve wrap which was well secured.  Nerve was noted to be healthy in appearance, nerve wrap in stable position.  There was no significant seroma or fluid collection appreciated.  Careful neurolysis was performed of the median nerve both proximally and distally to ensure no significant compression from the nerve wrap.  At this juncture, copious irrigation was performed of the wound.  There was no purulent drainage appreciated, cultures were not deemed to be necessary.  Closure was then performed in layers utilizing 3-0 Vicryl for the subcutaneous layer followed by 4-0 nylon for the skin surface in horizontal mattress fashion.  Sterile dressings were applied followed by application of a volar slab wrist splint utilizing plaster.  The tourniquet was deflated at 42 minutes.  Fingertips were pink with brisk capillary refill after deflation of tourniquet.  The operative drapes were broken down.  The patient was awoken from anesthesia safely and taken to PACU in stable condition.  I will see her back in the office in 1 week for  postoperative followup.     Samuella Cota, MD Electronically signed, 02/03/23

## 2023-02-03 NOTE — Anesthesia Procedure Notes (Signed)
Procedure Name: LMA Insertion Date/Time: 02/03/2023 11:11 AM  Performed by: Alvera Novel, CRNAPre-anesthesia Checklist: Patient identified, Emergency Drugs available, Suction available and Patient being monitored Patient Re-evaluated:Patient Re-evaluated prior to induction Oxygen Delivery Method: Circle System Utilized Preoxygenation: Pre-oxygenation with 100% oxygen Induction Type: IV induction Ventilation: Mask ventilation without difficulty LMA: LMA with gastric port inserted LMA Size: 5.0 Number of attempts: 1 Airway Equipment and Method: Bite block Placement Confirmation: positive ETCO2 Tube secured with: Tape Dental Injury: Teeth and Oropharynx as per pre-operative assessment

## 2023-02-03 NOTE — Anesthesia Postprocedure Evaluation (Signed)
Anesthesia Post Note  Patient: SHAVONDRA MACADAMS  Procedure(s) Performed: RIGHT WRIST INCISION AND DRAINAGE OF DEEP ABSCESS CLOSURE (Right: Wrist)     Patient location during evaluation: PACU Anesthesia Type: MAC Level of consciousness: awake and alert Pain management: pain level controlled Vital Signs Assessment: post-procedure vital signs reviewed and stable Respiratory status: spontaneous breathing, nonlabored ventilation, respiratory function stable and patient connected to nasal cannula oxygen Cardiovascular status: stable and blood pressure returned to baseline Postop Assessment: no apparent nausea or vomiting Anesthetic complications: no   No notable events documented.  Last Vitals:  Vitals:   02/03/23 1245 02/03/23 1300  BP: (!) 145/67 (!) 164/86  Pulse: 73 75  Resp: 17 18  Temp:    SpO2: 98% 90%    Last Pain:  Vitals:   02/03/23 1245  TempSrc:   PainSc: 10-Worst pain ever                 Kimmy Totten

## 2023-02-04 ENCOUNTER — Encounter: Payer: 59 | Admitting: Rehabilitative and Restorative Service Providers"

## 2023-02-04 NOTE — Anesthesia Postprocedure Evaluation (Signed)
Anesthesia Post Note  Patient: Katrina Vega  Procedure(s) Performed: RIGHT WRIST INCISION AND DRAINAGE OF DEEP ABSCESS CLOSURE (Right: Wrist)     Patient location during evaluation: PACU Anesthesia Type: General Level of consciousness: awake and alert Pain management: pain level controlled Vital Signs Assessment: post-procedure vital signs reviewed and stable Respiratory status: spontaneous breathing, nonlabored ventilation, respiratory function stable and patient connected to nasal cannula oxygen Cardiovascular status: blood pressure returned to baseline and stable Postop Assessment: no apparent nausea or vomiting Anesthetic complications: no   No notable events documented.  Last Vitals:  Vitals:   02/03/23 1315 02/03/23 1355  BP: (!) 145/66 (!) 147/78  Pulse: 68 67  Resp: 20 20  Temp:  (!) 36.3 C  SpO2: 94% 96%    Last Pain:  Vitals:   02/03/23 1355  TempSrc: Temporal  PainSc: 2                  Verenis Nicosia

## 2023-02-04 NOTE — Addendum Note (Signed)
Addendum  created 02/04/23 1043 by Bethena Midget, MD   Clinical Note Signed

## 2023-02-06 ENCOUNTER — Encounter: Payer: 59 | Admitting: Neurology

## 2023-02-09 ENCOUNTER — Ambulatory Visit (HOSPITAL_BASED_OUTPATIENT_CLINIC_OR_DEPARTMENT_OTHER)
Admission: RE | Admit: 2023-02-09 | Discharge: 2023-02-09 | Disposition: A | Discharge status: Home / Self Care | Payer: 59 | Source: Ambulatory Visit | Attending: Family Medicine | Admitting: Family Medicine

## 2023-02-09 ENCOUNTER — Ambulatory Visit (INDEPENDENT_AMBULATORY_CARE_PROVIDER_SITE_OTHER): Payer: 59 | Admitting: Family Medicine

## 2023-02-09 ENCOUNTER — Ambulatory Visit: Payer: 59 | Admitting: Orthopedic Surgery

## 2023-02-09 VITALS — BP 132/70 | HR 90 | Temp 98.4°F | Resp 18 | Ht 65.0 in | Wt 253.4 lb

## 2023-02-09 DIAGNOSIS — M25552 Pain in left hip: Secondary | ICD-10-CM | POA: Diagnosis not present

## 2023-02-09 DIAGNOSIS — M7918 Myalgia, other site: Secondary | ICD-10-CM

## 2023-02-09 DIAGNOSIS — M25531 Pain in right wrist: Secondary | ICD-10-CM | POA: Diagnosis not present

## 2023-02-09 DIAGNOSIS — Z9889 Other specified postprocedural states: Secondary | ICD-10-CM

## 2023-02-09 DIAGNOSIS — M1612 Unilateral primary osteoarthritis, left hip: Secondary | ICD-10-CM | POA: Diagnosis not present

## 2023-02-09 DIAGNOSIS — Z96642 Presence of left artificial hip joint: Secondary | ICD-10-CM | POA: Diagnosis not present

## 2023-02-09 DIAGNOSIS — Z23 Encounter for immunization: Secondary | ICD-10-CM

## 2023-02-09 MED ORDER — CYCLOBENZAPRINE HCL 10 MG PO TABS: 10.0000 mg | ORAL_TABLET | Freq: Three times a day (TID) | ORAL | 0 refills | Status: DC | PRN

## 2023-02-09 MED ORDER — PREDNISONE 10 MG PO TABS: ORAL_TABLET | ORAL | 0 refills | Status: DC

## 2023-02-09 MED ORDER — SULFAMETHOXAZOLE-TRIMETHOPRIM 800-160 MG PO TABS
1.0000 | ORAL_TABLET | Freq: Two times a day (BID) | ORAL | 0 refills | Status: DC
Start: 2023-02-09 — End: 2023-05-12
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
## 2023-02-09 NOTE — Progress Notes (Signed)
   KENNYA OJO - 67 y.o. female MRN [id number]  Date of birth: August 08, 1955  Office Visit Note: Visit Date: 02/09/2023 PCP: Donato Schultz, DO Referred by: Donato Schultz, *  Subjective:  HPI: SHELLSEA WENZL is a 67 y.o. female who presents today for follow up 1 week status post right wrist incision and drainage status post prior open extended carpal tunnel release with nerve wrap.  At her surgery, there was minimal purulence, mostly serous drainage from this area.  She has been compliant with the immobilization as instructed.  She was taking antibiotics as instructed as well.  Pertinent ROS were reviewed with the patient and found to be negative unless otherwise specified above in HPI.   Assessment & Plan: Visit Diagnoses: No diagnosis found.  Plan: Based on examination today, there is still ongoing slow healing at the incisional site.  I have instructed her to do daily dressing changes with dry dressings moving forward.  We will transition her to Bactrim.  I will plan on seeing her later this week for a wound check.  She was given strict return instructions should she notice any increasing purulence or drainage from her wound site.  Follow-up: No follow-ups on file.   Meds & Orders: No orders of the defined types were placed in this encounter.  No orders of the defined types were placed in this encounter.    Procedures: No procedures performed       Objective:   Vital Signs: There were no vitals taken for this visit.  Ortho Exam Right upper extremity: - Incisional site with sutures in place, small area of defect with no significant expressible drainage on examination today, there is notable granulation tissue in this area - Digital range of motion is well-preserved, sensation intact distally, hand is warm well-perfused  Imaging: No results found.   Audelia Knape Trevor Mace, M.D. [facility] 3:34 PM

## 2023-02-10 ENCOUNTER — Encounter: Payer: Self-pay | Admitting: Orthopedic Surgery

## 2023-02-13 ENCOUNTER — Ambulatory Visit (INDEPENDENT_AMBULATORY_CARE_PROVIDER_SITE_OTHER): Payer: 59 | Admitting: Orthopedic Surgery

## 2023-02-13 DIAGNOSIS — Z9889 Other specified postprocedural states: Secondary | ICD-10-CM

## 2023-02-13 NOTE — Progress Notes (Unsigned)
   Katrina Vega - 67 y.o. female MRN [id number]  Date of birth: 1955-07-27  Office Visit Note: Visit Date: 02/13/2023 PCP: Donato Schultz, DO Referred by: Donato Schultz, *  Subjective:  HPI: Katrina Vega is a 67 y.o. female who presents today for follow up 1 week 3 days status post right wrist incision and drainage of deep abscess closure.  Pertinent ROS were reviewed with the patient and found to be negative unless otherwise specified above in HPI.   Assessment & Plan: Visit Diagnoses: No diagnosis found.  Plan: ***  Follow-up: No follow-ups on file.   Meds & Orders: No orders of the defined types were placed in this encounter.  No orders of the defined types were placed in this encounter.    Procedures: No procedures performed       Objective:   Vital Signs: There were no vitals taken for this visit.  Ortho Exam ***  Imaging: No results found.   Francys Bolin Trevor Mace, M.D. [facility] 10:01 AM

## 2023-02-15 ENCOUNTER — Other Ambulatory Visit: Payer: Self-pay | Admitting: Family

## 2023-02-19 ENCOUNTER — Ambulatory Visit: Payer: 59 | Admitting: Orthopedic Surgery

## 2023-02-20 ENCOUNTER — Ambulatory Visit (INDEPENDENT_AMBULATORY_CARE_PROVIDER_SITE_OTHER): Payer: 59 | Admitting: Orthopedic Surgery

## 2023-02-20 DIAGNOSIS — Z9889 Other specified postprocedural states: Secondary | ICD-10-CM

## 2023-02-20 NOTE — Progress Notes (Signed)
   Katrina Vega - 67 y.o. female MRN [id number]  Date of birth: 1955-06-27  Office Visit Note: Visit Date: 02/20/2023 PCP: Donato Schultz, DO Referred by: Donato Schultz, *  Subjective:  HPI: Katrina Vega is a 67 y.o. female who presents today for follow up 2 weeks status post right wrist incision and draiage for slow wound healing from right CTR revision with nerve wrap.  Pertinent ROS were reviewed with the patient and found to be negative unless otherwise specified above in HPI.   Assessment & Plan: Visit Diagnoses: No diagnosis found.  Plan: On examination today, she is demonstrating appropriate interval healing.  I would like the sutures to remain in for additional 1 week.  I did advise that she can get the wound wet at this point and pat the incision dry.  There is a small area of eschar at the midpoint of the incision that I want to see more granulation tissue prior to suture removal.  Dressing changes as needed.  Follow-up in approximate 1 week for wound check and likely suture removal at that time.  Follow-up: No follow-ups on file.   Meds & Orders: No orders of the defined types were placed in this encounter.  No orders of the defined types were placed in this encounter.    Procedures: No procedures performed       Objective:   Vital Signs: There were no vitals taken for this visit.  Ortho Exam Right upper extremity: - Incisional site with sutures in place, prior small skin defect with no significant expressible drainage on examination today, there is notable granulation tissue with superficial eschar in this area - Digital range of motion is well-preserved, sensation intact distally, hand is warm well-perfused  Imaging: No results found.   Dawsyn Zurn Trevor Mace, M.D. [facility] 10:28 AM

## 2023-02-22 NOTE — Progress Notes (Deleted)
Follow Up Note  RE: Katrina Vega MRN: [id number] DOB: 29-Jan-1956 Date of Office Visit: 02/23/2023  Referring provider: Zola Button, Grayling Congress, * Primary care provider: Zola Button, Grayling Congress, DO  Chief Complaint: No chief complaint on file.  History of Present Illness: I had the pleasure of seeing Katrina Vega for a follow up visit at the [facility] on 02/22/2023. She is a 67 y.o. female, who is being followed for asthma/COPD on Fasenra, allergic rhinitis, GERD. Her previous allergy office visit was on 11/24/2022 with Dr. Selena Batten. Today is a regular follow up visit.  Discussed the use of AI scribe software for clinical note transcription with the patient, who gave verbal consent to proceed.  History of Present Illness          Assessment and Plan: Jojo is a 67 y.o. female with: Asthma-COPD overlap syndrome Stable but triggered by strong scents at work. Albuterol causes shaking/palpitations.  Today's spirometry was unremarkable.  Use a hepa filter at your desk at work to decrease scents/smells - letter written.  Daily controller medication(s): Breztri 2 puffs twice a day with spacer and rinse mouth afterwards. Fasenra injections every 8 weeks. Continue Singulair (montelukast) 10mg  daily at night. During respiratory infections/flares:  Start Pulmicort (budesonide) 0.5mg  nebulizer twice a day for 1-2 weeks until your breathing symptoms return to baseline.  Pretreat with levoalbuterol 2 puffs or levoalbuterol nebulizer.  If you need to use your levoalbuterol nebulizer machine back to back within 15-30 minutes with no relief then please go to the ER/urgent care for further evaluation.  May use levoalbuterol rescue inhaler 2 puffs or nebulizer every 4 to 6 hours as needed for shortness of breath, chest tightness, coughing, and wheezing. May use levoalbuterol rescue inhaler 2 puffs 5 to 15 minutes prior to strenuous physical activities. Monitor frequency of use - if you  need to use it more than twice per week on a consistent basis let us know.    Seasonal allergic rhinitis due to pollen Allergic rhinitis due to dust mite Allergic rhinitis due to mold Allergic rhinitis due to animal dander Past history - started on AIT in [place] in 2021 [facility]). Interim history - stopped AIT due to persistent coughing but no worsening symptoms.  Continue environmental control measures. Stop allergy injections. Use over the counter antihistamines such as Zyrtec (cetirizine), Claritin (loratadine), Allegra (fexofenadine), or Xyzal (levocetirizine) daily as needed. May take twice a day during allergy flares. May switch antihistamines every few months. Use Nasacort (triamcinolone) nasal spray 1 spray per nostril twice a day as needed for nasal congestion.  Use azelastine nasal spray 1-2 sprays per nostril twice a day as needed for runny nose/drainage. Nasal saline spray (i.e., Simply Saline) or nasal saline lavage (i.e., NeilMed) is recommended as needed and prior to medicated nasal sprays. Use refresh eye drops as needed. Stop cromolyn eye drops - as it can worsen your dry eyes.    Gastroesophageal reflux disease, unspecified whether esophagitis present Continue lifestyle and dietary modifications. Continue Protonix 40mg  twice day - nothing to eat or drink for 20-30 minutes afterwards.  Recommend GI evaluation.  Assessment and Plan              No follow-ups on file.  No orders of the defined types were placed in this encounter.  Lab Orders  No laboratory test(s) ordered today    Diagnostics: Spirometry:  Tracings reviewed. Her effort: {Blank single:19197::"Good reproducible efforts.","It was hard to get  consistent efforts and there is a question as to whether this reflects a maximal maneuver.","Poor effort, data can not be interpreted."} FVC: ***L FEV1: ***L, ***% predicted FEV1/FVC ratio: ***% Interpretation: {Blank single:19197::"Spirometry  consistent with mild obstructive disease","Spirometry consistent with moderate obstructive disease","Spirometry consistent with severe obstructive disease","Spirometry consistent with possible restrictive disease","Spirometry consistent with mixed obstructive and restrictive disease","Spirometry uninterpretable due to technique","Spirometry consistent with normal pattern","No overt abnormalities noted given today's efforts"}.  Please see scanned spirometry results for details.  Skin Testing: {Blank single:19197::"Select foods","Environmental allergy panel","Environmental allergy panel and select foods","Food allergy panel","None","Deferred due to recent antihistamines use"}. *** Results discussed with patient/family.   Medication List:  Current Outpatient Medications  Medication Sig Dispense Refill   Budeson-Glycopyrrol-Formoterol (BREZTRI AEROSPHERE) 160-9-4.8 MCG/ACT AERO INHALE 2 INHALATIONS BY MOUTH  TWICE DAILY TO PREVENT COUGH OR  WHEEZE. RINSE, GARGLE, AND SPIT  AFTER USE 32.1 g 1   budesonide (PULMICORT) 0.5 MG/2ML nebulizer solution Take 2 mLs (0.5 mg total) by nebulization in the morning and at bedtime. During respiratory infections for 1-2 weeks at a time. 120 mL 2   bumetanide (BUMEX) 1 MG tablet TAKE 1 TABLET BY MOUTH DAILY 90 tablet 3   cephALEXin (KEFLEX) 250 MG capsule Take 1 capsule (250 mg total) by mouth 4 (four) times daily. 28 capsule 0   Cholecalciferol 50 MCG (2000 UT) TABS Take 2,000 Units by mouth daily.     clopidogrel (PLAVIX) 75 MG tablet TAKE 1 TABLET BY MOUTH DAILY 100 tablet 2   Continuous Blood Gluc Sensor (DEXCOM G6 SENSOR) MISC USE 1 SENSOR AS NEEDED CHANGE EVERY 10 DAYS     Continuous Blood Gluc Transmit (DEXCOM G6 TRANSMITTER) MISC      cyclobenzaprine (FLEXERIL) 10 MG tablet Take 1 tablet (10 mg total) by mouth 3 (three) times daily as needed for muscle spasms. 30 tablet 0   diazepam (VALIUM) 5 MG tablet Take one tablet by mouth with food one hour prior to  procedure. May repeat 30 minutes prior if needed. (Patient not taking: Reported on 02/09/2023) 2 tablet 0   empagliflozin (JARDIANCE) 10 MG TABS tablet Take 1 tablet (10 mg total) by mouth daily before breakfast. 30 tablet 3   EPINEPHrine (EPIPEN 2-PAK) 0.3 mg/0.3 mL IJ SOAJ injection Use as directed for severe allergic reactions 2 each 3   famotidine (PEPCID) 20 MG tablet Take 20 mg by mouth 2 (two) times daily.     FASENRA PEN 30 MG/ML prefilled autoinjector INJECT 30MG  SUBCUTANEOUSLY EVERY 8 WEEKS 1 mL 6   gabapentin (NEURONTIN) 300 MG capsule TAKE 1 CAPSULE BY MOUTH 3 TIMES  DAILY 300 capsule 2   glucagon 1 MG injection Inject 1 mg into the muscle once as needed (low blood sugar).     glucose 4 GM chewable tablet Chew 1 tablet by mouth once as needed for low blood sugar.     HUMALOG 100 UNIT/ML injection Inject into the skin.     HYDROcodone-acetaminophen (NORCO/VICODIN) 5-325 MG tablet Take 1 tablet by mouth every 6 (six) hours as needed for up to 30 doses for moderate pain (pain score 4-6). 30 tablet 0   Insulin Disposable Pump (OMNIPOD 5 G6 INTRO, GEN 5,) KIT CHANGE POD EVERY 2 DAYS     isosorbide mononitrate (IMDUR) 60 MG 24 hr tablet TAKE 1 TABLET BY MOUTH DAILY 100 tablet 2   levalbuterol (XOPENEX HFA) 45 MCG/ACT inhaler USE 2 INHALATIONS BY MOUTH EVERY 4 TO 6 HOURS AS NEEDED FOR  SHORTNESS OF BREATH ,  COUGH,  WHEEZE AND TIGHTNESS IN CHEST 45 g 6   levalbuterol (XOPENEX) 0.63 MG/3ML nebulizer solution Take 3 mLs (0.63 mg total) by nebulization every 4 (four) hours as needed for wheezing or shortness of breath (coughing fits). 75 mL 1   levocetirizine (XYZAL) 5 MG tablet Take 1 tablet (5 mg total) by mouth every evening. 90 tablet 3   lisinopril (ZESTRIL) 20 MG tablet Take 20 mg by mouth daily.     metoprolol tartrate (LOPRESSOR) 50 MG tablet TAKE 1 TABLET BY MOUTH EVERY 12  HOURS 60 tablet 11   montelukast (SINGULAIR) 10 MG tablet TAKE 1 TABLET BY MOUTH AT  BEDTIME 100 tablet 2    nitroGLYCERIN (NITROSTAT) 0.4 MG SL tablet Place 1 tablet (0.4 mg total) under the tongue every 5 (five) minutes as needed for chest pain. 25 tablet 3   OZEMPIC, 2 MG/DOSE, 8 MG/3ML SOPN Inject 2 mg into the skin once a week.     pantoprazole (PROTONIX) 40 MG tablet Take 1 tablet (40 mg total) by mouth daily. (Patient taking differently: Take 40 mg by mouth 2 (two) times daily before a meal.) 30 tablet 3   potassium chloride (KLOR-CON) 10 MEQ tablet Take 1 tablet (10 mEq total) by mouth daily. 90 tablet 3   predniSONE (DELTASONE) 10 MG tablet TAKE 3 TABLETS PO QD FOR 3 DAYS THEN TAKE 2 TABLETS PO QD FOR 3 DAYS THEN TAKE 1 TABLET PO QD FOR 3 DAYS THEN TAKE 1/2 TAB PO QD FOR 3 DAYS 20 tablet 0   Respiratory Therapy Supplies (CARETOUCH 2 CPAP HOSE HANGER) MISC by Does not apply route.     rosuvastatin (CRESTOR) 40 MG tablet TAKE 1 TABLET BY MOUTH AT  BEDTIME 100 tablet 2   sulfamethoxazole-trimethoprim (BACTRIM DS) 800-160 MG tablet Take 1 tablet by mouth 2 (two) times daily. 14 tablet 0   Current Facility-Administered Medications  Medication Dose Route Frequency Provider Last Rate Last Admin   Benralizumab SOSY 30 mg  30 mg Subcutaneous Q8 Lynnae January, FNP   30 mg at 12/29/22 1337   Allergies: Allergies  Allergen Reactions   Contrast Media [Iodinated Contrast Media] Shortness Of Breath and Other (See Comments)    sob, chest tight 2024, small amount in joint injection tolerated well   Morphine Itching   Ioversol    Oxycodone Other (See Comments)   Tramadol Itching and Nausea Only   I reviewed her past medical history, social history, family history, and environmental history and no significant changes have been reported from her previous visit.  Review of Systems  Constitutional:  Negative for appetite change, chills, fever and unexpected weight change.  HENT:  Negative for congestion and rhinorrhea.   Eyes:  Negative for itching.  Respiratory:  Positive for cough. Negative for  shortness of breath and wheezing.   Cardiovascular:  Negative for chest pain.  Gastrointestinal:  Negative for abdominal pain.  Genitourinary:  Negative for difficulty urinating.  Skin:  Negative for rash.  Allergic/Immunologic: Positive for environmental allergies.  Neurological:  Negative for headaches.    Objective: There were no vitals taken for this visit. There is no height or weight on file to calculate BMI. Physical Exam Vitals and nursing note reviewed.  Constitutional:      Appearance: Normal appearance. She is well-developed.  HENT:     Head: Normocephalic and atraumatic.     Right Ear: Tympanic membrane and external ear normal.     Left Ear: Tympanic membrane and external  ear normal.     Nose: Nose normal.     Mouth/Throat:     Mouth: Mucous membranes are moist.     Pharynx: Oropharynx is clear.  Eyes:     Conjunctiva/sclera: Conjunctivae normal.  Cardiovascular:     Rate and Rhythm: Normal rate and regular rhythm.     Heart sounds: Normal heart sounds. No murmur heard.    No friction rub. No gallop.  Pulmonary:     Effort: Pulmonary effort is normal.     Breath sounds: Normal breath sounds. No wheezing, rhonchi or rales.  Musculoskeletal:     Cervical back: Neck supple.  Skin:    General: Skin is warm.     Findings: No rash.  Neurological:     Mental Status: She is alert and oriented to person, place, and time.  Psychiatric:        Behavior: Behavior normal.    Previous notes and tests were reviewed. The plan was reviewed with the patient/family, and all questions/concerned were addressed.  It was my pleasure to see Aleeta today and participate in her care. Please feel free to contact me with any questions or concerns.  Sincerely,  Wyline Mood, DO Allergy & Immunology  [facility] office: [phone] [facility] office: [phone]

## 2023-02-23 ENCOUNTER — Ambulatory Visit: Payer: 59 | Admitting: Orthopedic Surgery

## 2023-02-23 ENCOUNTER — Ambulatory Visit: Payer: 59 | Admitting: Allergy

## 2023-02-23 ENCOUNTER — Ambulatory Visit: Payer: 59

## 2023-02-23 DIAGNOSIS — J4489 Other specified chronic obstructive pulmonary disease: Secondary | ICD-10-CM

## 2023-02-23 DIAGNOSIS — J3081 Allergic rhinitis due to animal (cat) (dog) hair and dander: Secondary | ICD-10-CM

## 2023-02-23 DIAGNOSIS — J301 Allergic rhinitis due to pollen: Secondary | ICD-10-CM

## 2023-02-23 DIAGNOSIS — K219 Gastro-esophageal reflux disease without esophagitis: Secondary | ICD-10-CM

## 2023-02-23 DIAGNOSIS — J3089 Other allergic rhinitis: Secondary | ICD-10-CM

## 2023-02-26 ENCOUNTER — Ambulatory Visit: Payer: 59 | Admitting: Orthopedic Surgery

## 2023-02-26 ENCOUNTER — Encounter: Payer: Self-pay | Admitting: Orthopedic Surgery

## 2023-02-26 DIAGNOSIS — Z9889 Other specified postprocedural states: Secondary | ICD-10-CM

## 2023-02-26 NOTE — Progress Notes (Signed)
   Katrina Vega - 68 y.o. female MRN [id number]  Date of birth: [birth year]  Office Visit Note: Visit Date: 02/26/2023 PCP: Antonio Cyndee Jamee JONELLE, DO Referred by: Antonio Cyndee Jamee JONELLE, *  Subjective:  HPI: Katrina Vega is a 68 y.o. female who presents today for follow up 3 weeks status post right wrist incision and draiage for slow wound healing from right CTR revision with nerve wrap. She is doing well, minimal spotting at incisional site, doing dressing changes as instructed. No fever or chills, no purulence.   Pertinent ROS were reviewed with the patient and found to be negative unless otherwise specified above in HPI.   Assessment & Plan: Visit Diagnoses: No diagnosis found.  Plan: On examination today, she is demonstrating appropriate interval healing.  Sutures removed today and steri strips applied.  I did advise that she can continue to get the wound wet at this point and pat the incision dry.  Dressing changes as needed.  Follow-up in approximate 2 week for wound check, hopefully at that juncture she can return to work.   Follow-up: No follow-ups on file.   Meds & Orders: No orders of the defined types were placed in this encounter.  No orders of the defined types were placed in this encounter.    Procedures: No procedures performed       Objective:   Vital Signs: There were no vitals taken for this visit.  Ortho Exam Right upper extremity: - Incisional site sutures removed, prior small skin defect with no significant expressible drainage on examination today, there is notable granulation tissue with superficial eschar in this area - Digital range of motion is well-preserved, sensation intact distally, hand is warm well-perfused  Imaging: No results found.   Cantrell Larouche Afton Alderton, M.D. [facility] 10:05 AM

## 2023-02-27 ENCOUNTER — Ambulatory Visit: Payer: 59 | Admitting: Orthopedic Surgery

## 2023-03-08 NOTE — Progress Notes (Signed)
 Follow Up Note  RE: Katrina Vega MRN: [id number] DOB: [birth year] Date of Office Visit: 03/09/2023  Referring provider: Antonio Meth, Katrina Vega, * Primary care provider: Antonio Meth, Katrina SAUNDERS, DO  Chief Complaint: Follow-up, Asthma, and Allergies  History of Present Illness: I had the pleasure of seeing Katrina Vega for a follow up visit at the [facility] on 03/09/2023. She is a 68 y.o. female, who is being followed for asthma/COPD on Fasenra , allergic rhinitis, GERD. Her previous allergy  office visit was on 11/24/2022 with Dr. Luke. Today is a regular follow up visit.  Discussed the use of AI scribe software for clinical note transcription with the patient, who gave verbal consent to proceed.  The patient recently returned from a two-week stay at her sister's house in [place] , where she was exposed to secondhand smoke. Upon return, she noticed a significant decline in her respiratory status, necessitating immediate initiation of breathing treatments. She also reports a recent hand surgery, which required a second operation due to poor healing. The hand is still in the healing process and physical therapy is planned.  Currently taking Breztri  (2 puffs twice daily) and Fasenra  injections every eight weeks. She has not required ER or urgent care visits for her respiratory symptoms recently. She was on prednisone  recently due to her lower back pain issues.  In addition to her respiratory issues, the patient reports a history of allergies, which have been relatively well-controlled since discontinuing allergy  shots. She continues to use nasal sprays, but recently experienced nasal bleeding, which she attributes to dry air from her gas heating system. She also reports waking up with eyes almost sealed shut due to crusting, which she plans to discuss with her eye doctor at an upcoming appointment.  The patient also has a history of reflux, for which she takes PPI medication twice  daily. She has not yet seen a GI specialist for this issue.      Assessment and Plan: Katrina Vega is a 68 y.o. female with: Asthma-COPD overlap syndrome Past history - triggered by strong scents at work. Albuterol  caused shaking/palpitations.  Interim history - Recent exposure to secondhand smoke leading to increased respiratory symptoms. Today's spirometry was unremarkable - but slightly worse than prior one.  Daily controller medication(s): Breztri  2 puffs twice a day with spacer and rinse mouth afterwards. Fasenra  injections every 8 weeks. Continue Singulair  (montelukast ) 10mg  daily at night. During respiratory infections/flares:  Start Pulmicort  (budesonide ) 0.5mg  nebulizer twice a day for 1-2 weeks until your breathing symptoms return to baseline.  Pretreat with levoalbuterol 2 puffs or levoalbuterol nebulizer.  If you need to use your levoalbuterol nebulizer machine back to back within 15-30 minutes with no relief then please go to the ER/urgent care for further evaluation.  May use levoalbuterol rescue inhaler 2 puffs or nebulizer every 4 to 6 hours as needed for shortness of breath, chest tightness, coughing, and wheezing. May use levoalbuterol rescue inhaler 2 puffs 5 to 15 minutes prior to strenuous physical activities. Monitor frequency of use - if you need to use it more than twice per week on a consistent basis let us  know.    Seasonal allergic rhinitis due to pollen Allergic rhinitis due to dust mite Allergic rhinitis due to mold Allergic rhinitis due to animal dander Past history - started on [place]IT in [place] in 2021 [facility]). Stopped AIT due to persistent coughing Interim history -  stable.  Continue environmental control measures. Use over the counter  antihistamines such as Zyrtec (cetirizine), Claritin (loratadine), Allegra (fexofenadine), or Xyzal  (levocetirizine) daily as needed. May take twice a day during allergy  flares. May switch antihistamines every few months. Use  Nasacort  (triamcinolone ) nasal spray 1 spray per nostril twice a day as needed for nasal congestion.  Use azelastine  nasal spray 1-2 sprays per nostril twice a day as needed for runny nose/drainage. Nasal saline spray (i.e., Simply Saline) or nasal saline lavage (i.e., NeilMed) is recommended as needed and prior to medicated nasal sprays. Use refresh eye drops as needed.   Gastroesophageal reflux disease, unspecified whether esophagitis present Continue lifestyle and dietary modifications. Continue Protonix  40mg  twice day - nothing to eat or drink for 20-30 minutes afterwards.  Recommend GI evaluation.   Follow up with eye doctor regarding crusting.   Return in about 4 months (around 07/07/2023).  Meds ordered this encounter  Medications   budesonide  (PULMICORT ) 0.5 MG/2ML nebulizer solution    Sig: Take 2 mLs (0.5 mg total) by nebulization in the morning and at bedtime. During respiratory infections for 1-2 weeks at a time.    Dispense:  120 mL    Refill:  2   Lab Orders  No laboratory test(s) ordered today    Diagnostics: Spirometry:  Tracings reviewed. Her effort: It was hard to get consistent efforts and there is a question as to whether this reflects a maximal maneuver. FVC: 1.82L FEV1: 1.54L, 76% predicted FEV1/FVC ratio: 85% Interpretation: No overt abnormalities noted given today's efforts.  Please see scanned spirometry results for details.  Results discussed with patient/family.   Medication List:  Current Outpatient Medications  Medication Sig Dispense Refill   Budeson-Glycopyrrol-Formoterol  (BREZTRI  AEROSPHERE) 160-9-4.8 MCG/ACT AERO INHALE 2 INHALATIONS BY MOUTH  TWICE DAILY TO PREVENT COUGH OR  WHEEZE. RINSE, GARGLE, AND SPIT  AFTER USE 32.1 g 1   bumetanide  (BUMEX ) 1 MG tablet TAKE 1 TABLET BY MOUTH DAILY 90 tablet 3   cephALEXin  (KEFLEX ) 250 MG capsule Take 1 capsule (250 mg total) by mouth 4 (four) times daily. 28 capsule 0   Cholecalciferol 50 MCG (2000 UT)  TABS Take 2,000 Units by mouth daily.     clopidogrel  (PLAVIX ) 75 MG tablet TAKE 1 TABLET BY MOUTH DAILY 100 tablet 2   Continuous Blood Gluc Sensor (DEXCOM G6 SENSOR) MISC USE 1 SENSOR AS NEEDED CHANGE EVERY 10 DAYS     Continuous Blood Gluc Transmit (DEXCOM G6 TRANSMITTER) MISC      cyclobenzaprine  (FLEXERIL ) 10 MG tablet Take 1 tablet (10 mg total) by mouth 3 (three) times daily as needed for muscle spasms. 30 tablet 0   diazepam  (VALIUM ) 5 MG tablet Take one tablet by mouth with food one hour prior to procedure. May repeat 30 minutes prior if needed. 2 tablet 0   empagliflozin  (JARDIANCE ) 10 MG TABS tablet Take 1 tablet (10 mg total) by mouth daily before breakfast. 30 tablet 3   EPINEPHrine  (EPIPEN  2-PAK) 0.3 mg/0.3 mL IJ SOAJ injection Use as directed for severe allergic reactions 2 each 3   famotidine  (PEPCID ) 20 MG tablet Take 20 mg by mouth 2 (two) times daily.     FASENRA  PEN 30 MG/ML prefilled autoinjector INJECT 30MG  SUBCUTANEOUSLY EVERY 8 WEEKS 1 mL 6   gabapentin  (NEURONTIN ) 300 MG capsule TAKE 1 CAPSULE BY MOUTH 3 TIMES  DAILY 300 capsule 2   glucagon  1 MG injection Inject 1 mg into the muscle once as needed (low blood sugar).     glucose 4 GM chewable tablet Chew 1  tablet by mouth once as needed for low blood sugar.     HUMALOG  100 UNIT/ML injection Inject into the skin.     HYDROcodone -acetaminophen  (NORCO/VICODIN) 5-325 MG tablet Take 1 tablet by mouth every 6 (six) hours as needed for up to 30 doses for moderate pain (pain score 4-6). 30 tablet 0   Insulin  Disposable Pump (OMNIPOD 5 G6 INTRO, GEN 5,) KIT CHANGE POD EVERY 2 DAYS     isosorbide  mononitrate (IMDUR ) 60 MG 24 hr tablet TAKE 1 TABLET BY MOUTH DAILY 100 tablet 2   levalbuterol  (XOPENEX  HFA) 45 MCG/ACT inhaler USE 2 INHALATIONS BY MOUTH EVERY 4 TO 6 HOURS AS NEEDED FOR  SHORTNESS OF BREATH , COUGH,  WHEEZE AND TIGHTNESS IN CHEST 45 g 6   levalbuterol  (XOPENEX ) 0.63 MG/3ML nebulizer solution Take 3 mLs (0.63 mg total) by  nebulization every 4 (four) hours as needed for wheezing or shortness of breath (coughing fits). 75 mL 1   levocetirizine (XYZAL ) 5 MG tablet Take 1 tablet (5 mg total) by mouth every evening. 90 tablet 3   lisinopril  (ZESTRIL ) 20 MG tablet Take 20 mg by mouth daily.     metoprolol  tartrate (LOPRESSOR ) 50 MG tablet TAKE 1 TABLET BY MOUTH EVERY 12  HOURS 60 tablet 11   montelukast  (SINGULAIR ) 10 MG tablet TAKE 1 TABLET BY MOUTH AT  BEDTIME 100 tablet 2   nitroGLYCERIN  (NITROSTAT ) 0.4 MG SL tablet Place 1 tablet (0.4 mg total) under the tongue every 5 (five) minutes as needed for chest pain. 25 tablet 3   OZEMPIC, 2 MG/DOSE, 8 MG/3ML SOPN Inject 2 mg into the skin once a week.     pantoprazole  (PROTONIX ) 40 MG tablet Take 1 tablet (40 mg total) by mouth daily. (Patient taking differently: Take 40 mg by mouth 2 (two) times daily before a meal.) 30 tablet 3   potassium chloride  (KLOR-CON ) 10 MEQ tablet Take 1 tablet (10 mEq total) by mouth daily. 90 tablet 3   predniSONE  (DELTASONE ) 10 MG tablet TAKE 3 TABLETS PO QD FOR 3 DAYS THEN TAKE 2 TABLETS PO QD FOR 3 DAYS THEN TAKE 1 TABLET PO QD FOR 3 DAYS THEN TAKE 1/2 TAB PO QD FOR 3 DAYS 20 tablet 0   Respiratory Therapy Supplies (CARETOUCH 2 CPAP HOSE HANGER) MISC by Does not apply route.     rosuvastatin  (CRESTOR ) 40 MG tablet TAKE 1 TABLET BY MOUTH AT  BEDTIME 100 tablet 2   sulfamethoxazole -trimethoprim  (BACTRIM  DS) 800-160 MG tablet Take 1 tablet by mouth 2 (two) times daily. 14 tablet 0   budesonide  (PULMICORT ) 0.5 MG/2ML nebulizer solution Take 2 mLs (0.5 mg total) by nebulization in the morning and at bedtime. During respiratory infections for 1-2 weeks at a time. 120 mL 2   Current Facility-Administered Medications  Medication Dose Route Frequency Provider Last Rate Last Admin   Benralizumab  SOSY 30 mg  30 mg Subcutaneous Q8 Weeks Dale, Christine, FNP   30 mg at 12/29/22 1337   Allergies: Allergies  Allergen Reactions   Contrast Media  [Iodinated Contrast Media] Shortness Of Breath and Other (See Comments)    sob, chest tight 2024, small amount in joint injection tolerated well   Morphine Itching   Ioversol    Oxycodone  Other (See Comments)   Tramadol  Itching and Nausea Only   I reviewed her past medical history, social history, family history, and environmental history and no significant changes have been reported from her previous visit.  Review of Systems  Constitutional:  Negative for appetite change, chills, fever and unexpected weight change.  HENT:  Negative for congestion and rhinorrhea.   Eyes:  Negative for itching.  Respiratory:  Positive for shortness of breath. Negative for wheezing.   Cardiovascular:  Negative for chest pain.  Gastrointestinal:  Negative for abdominal pain.  Genitourinary:  Negative for difficulty urinating.  Skin:  Negative for rash.  Allergic/Immunologic: Positive for environmental allergies.  Neurological:  Negative for headaches.    Objective: BP 138/70 (BP Location: Left Arm, Patient Position: Sitting, Cuff Size: Large)   Pulse 97   Temp 98 F (36.7 C) (Temporal)   Resp 16   SpO2 95%  There is no height or weight on file to calculate BMI. Physical Exam Vitals and nursing note reviewed.  Constitutional:      Appearance: Normal appearance. She is well-developed.  HENT:     Head: Normocephalic and atraumatic.     Right Ear: Tympanic membrane and external ear normal.     Left Ear: Tympanic membrane and external ear normal.     Nose: Nose normal.     Mouth/Throat:     Mouth: Mucous membranes are moist.     Pharynx: Oropharynx is clear.  Eyes:     Conjunctiva/sclera: Conjunctivae normal.  Cardiovascular:     Rate and Rhythm: Normal rate and regular rhythm.     Heart sounds: Normal heart sounds. No murmur heard.    No friction rub. No gallop.  Pulmonary:     Effort: Pulmonary effort is normal.     Breath sounds: Normal breath sounds. No wheezing, rhonchi or rales.   Musculoskeletal:     Cervical back: Neck supple.     Comments: Right wrist in brace.  Skin:    General: Skin is warm.     Findings: No rash.  Neurological:     Mental Status: She is alert and oriented to person, place, and time.  Psychiatric:        Behavior: Behavior normal.    Previous notes and tests were reviewed. The plan was reviewed with the patient/family, and all questions/concerned were addressed.  It was my pleasure to see Raveena today and participate in her care. Please feel free to contact me with any questions or concerns.  Sincerely,  Orlan Cramp, DO Allergy  & Immunology  [facility]  [place] office: [phone] [facility] office: [phone]

## 2023-03-09 ENCOUNTER — Ambulatory Visit (INDEPENDENT_AMBULATORY_CARE_PROVIDER_SITE_OTHER): Payer: 59 | Admitting: Orthopedic Surgery

## 2023-03-09 ENCOUNTER — Ambulatory Visit (INDEPENDENT_AMBULATORY_CARE_PROVIDER_SITE_OTHER): Payer: 59 | Admitting: Allergy

## 2023-03-09 ENCOUNTER — Encounter: Payer: Self-pay | Admitting: Allergy

## 2023-03-09 ENCOUNTER — Other Ambulatory Visit: Payer: Self-pay

## 2023-03-09 VITALS — BP 138/70 | HR 97 | Temp 98.0°F | Resp 16

## 2023-03-09 DIAGNOSIS — K219 Gastro-esophageal reflux disease without esophagitis: Secondary | ICD-10-CM | POA: Diagnosis not present

## 2023-03-09 DIAGNOSIS — J301 Allergic rhinitis due to pollen: Secondary | ICD-10-CM | POA: Diagnosis not present

## 2023-03-09 DIAGNOSIS — Z9889 Other specified postprocedural states: Secondary | ICD-10-CM

## 2023-03-09 DIAGNOSIS — J3081 Allergic rhinitis due to animal (cat) (dog) hair and dander: Secondary | ICD-10-CM | POA: Diagnosis not present

## 2023-03-09 DIAGNOSIS — J4489 Other specified chronic obstructive pulmonary disease: Secondary | ICD-10-CM

## 2023-03-09 DIAGNOSIS — J3089 Other allergic rhinitis: Secondary | ICD-10-CM | POA: Diagnosis not present

## 2023-03-09 MED ORDER — BUDESONIDE 0.5 MG/2ML IN SUSP: 0.5000 mg | Freq: Two times a day (BID) | RESPIRATORY_TRACT | 2 refills | Status: AC

## 2023-03-09 NOTE — Patient Instructions (Addendum)
 Asthma Daily controller medication(s): Breztri  2 puffs twice a day with spacer and rinse mouth afterwards. Fasenra  injections every 8 weeks. Continue Singulair  (montelukast ) 10mg  daily at night. During respiratory infections/flares:  Start Pulmicort  (budesonide ) 0.5mg  nebulizer twice a day for 1-2 weeks until your breathing symptoms return to baseline.  Pretreat with levoalbuterol 2 puffs or levoalbuterol nebulizer.  If you need to use your levoalbuterol nebulizer machine back to back within 15-30 minutes with no relief then please go to the ER/urgent care for further evaluation.  May use levoalbuterol rescue inhaler 2 puffs or nebulizer every 4 to 6 hours as needed for shortness of breath, chest tightness, coughing, and wheezing. May use levoalbuterol rescue inhaler 2 puffs 5 to 15 minutes prior to strenuous physical activities. Monitor frequency of use - if you need to use it more than twice per week on a consistent basis let us  know.  Breathing control goals:  Full participation in all desired activities (may need albuterol  before activity) Albuterol  use two times or less a week on average (not counting use with activity) Cough interfering with sleep two times or less a month Oral steroids no more than once a year No hospitalizations   Environmental allergies Continue environmental control measures. Use over the counter antihistamines such as Zyrtec (cetirizine), Claritin (loratadine), Allegra (fexofenadine), or Xyzal  (levocetirizine) daily as needed. May take twice a day during allergy  flares. May switch antihistamines every few months. Use Nasacort  (triamcinolone ) nasal spray 1 spray per nostril twice a day as needed for nasal congestion.  Use azelastine  nasal spray 1-2 sprays per nostril twice a day as needed for runny nose/drainage. Nasal saline spray (i.e., Simply Saline) or nasal saline lavage (i.e., NeilMed) is recommended as needed and prior to medicated nasal sprays. Use refresh eye  drops as needed.  Nose Bleeds: Nosebleeds are very common.  Site of the bleeding is typically on the septum or at the very front of the nose.  Some of the more common causes are from trauma, inflammation or medication induced. Pinch both nostrils while leaning forward for at least 5 minutes before checking to see if the bleeding has stopped. If bleeding is not controlled within 5-10 minutes apply a cotton ball soaked with oxymetazoline (Afrin) to the bleeding nostril for a few seconds.  Preventative treatment: Apply saline nasal gel in each nostril twice a day for 2 weeks to allow the nasal mucosa to heal Consider using a humidifier in the winter Try to keep your blood pressure as normal as possible (120/80)  Reflux Continue lifestyle and dietary modifications. Continue Protonix  40mg  twice day - nothing to eat or drink for 20-30 minutes afterwards.  Recommend GI evaluation.    Follow up in 3-4 months or sooner if needed.

## 2023-03-09 NOTE — Progress Notes (Signed)
   Katrina Vega - 68 y.o. female MRN [id number]  Date of birth: 06/26/1955  Office Visit Note: Visit Date: 03/09/2023 PCP: Antonio Cyndee Jamee JONELLE, DO Referred by: Antonio Cyndee Jamee R, *  Subjective:  HPI: Katrina Vega is a 68 y.o. female who presents today for follow up 5 weeks status post right wrist irrigation with closure after right wrist carpal tunnel revision.  Pertinent ROS were reviewed with the patient and found to be negative unless otherwise specified above in HPI.   Assessment & Plan: Visit Diagnoses: No diagnosis found.  Plan: She is doing well today, is demonstrating appropriate healing at this time.  No active drainage on examination today.  She is having some ongoing hypersensitivity over the incisional site.  Would like her to resume with occupational therapy for desensitization exercises and range of motion for both the wrist and digits.  I will plan on seeing her back in approximately 4 weeks time for recheck.  Follow-up: No follow-ups on file.   Meds & Orders: No orders of the defined types were placed in this encounter.  No orders of the defined types were placed in this encounter.    Procedures: No procedures performed       Objective:   Vital Signs: There were no vitals taken for this visit.  Ortho Exam Right upper extremity incision is appropriately healing, no evidence of erythema, no active drainage on examination today Wrist range of motion is fairly restricted 20 degrees of flexion, 50 degrees extension actively today Digital range of motion also restriction secondary to pain, approximately 4 to 5 cm from the distal palmar crease in all digits Sensation is intact to light touch, does have some hypersensitivity around the incisional site region  Imaging: No results found.   Keyonna Comunale Afton Alderton, M.D. [facility] 10:17 AM

## 2023-03-10 ENCOUNTER — Ambulatory Visit: Payer: 59

## 2023-03-10 DIAGNOSIS — J4550 Severe persistent asthma, uncomplicated: Secondary | ICD-10-CM
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
## 2023-03-11 ENCOUNTER — Ambulatory Visit: Payer: 59 | Admitting: Orthopedic Surgery

## 2023-03-11 ENCOUNTER — Encounter: Payer: Self-pay | Admitting: Rehabilitative and Restorative Service Providers"

## 2023-03-11 ENCOUNTER — Ambulatory Visit: Payer: 59 | Admitting: Rehabilitative and Restorative Service Providers"

## 2023-03-11 DIAGNOSIS — R600 Localized edema: Secondary | ICD-10-CM

## 2023-03-11 DIAGNOSIS — M25631 Stiffness of right wrist, not elsewhere classified: Secondary | ICD-10-CM | POA: Diagnosis not present

## 2023-03-11 DIAGNOSIS — M6281 Muscle weakness (generalized): Secondary | ICD-10-CM | POA: Diagnosis not present

## 2023-03-11 DIAGNOSIS — M25641 Stiffness of right hand, not elsewhere classified: Secondary | ICD-10-CM

## 2023-03-11 DIAGNOSIS — M25531 Pain in right wrist: Secondary | ICD-10-CM

## 2023-03-12 ENCOUNTER — Ambulatory Visit (INDEPENDENT_AMBULATORY_CARE_PROVIDER_SITE_OTHER): Payer: 59 | Admitting: Neurology

## 2023-03-12 DIAGNOSIS — R202 Paresthesia of skin: Secondary | ICD-10-CM | POA: Diagnosis not present

## 2023-03-12 DIAGNOSIS — E1142 Type 2 diabetes mellitus with diabetic polyneuropathy: Secondary | ICD-10-CM

## 2023-03-12 NOTE — Procedures (Signed)
  [facility]  [address] [place], [address]  [place], [place] [address] Tel: [phone] Fax: [phone] Test Date:  03/12/2023  Patient: Katrina Vega DOB: [birth year] Physician: Nita Sickle, DO  Sex: Female Height: 5\' 5"  Ref Phys: Willia Craze, MD  ID#: [id number]   Technician:    History: This is a 68 year old female with insulin-dependent diabetes mellitus referred for evaluation of left radicular leg pain.  NCV & EMG Findings: Extensive electrodiagnostic testing of the left lower extremity shows:  Left sural and superficial peroneal sensory responses are absent. Left peroneal (EDB) and tibial motor responses are absent.  Left peroneal motor response at the tibialis anterior is within normal limits. Left tibial H reflex study shows mildly prolonged latency. Chronic motor axonal loss changes are seen affecting the muscles below the knee, without accompanying active denervation.  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 In particular, there is no evidence of a lumbosacral radiculopathy.   ___________________________ Nita Sickle, DO    Nerve Conduction Studies   Stim Site NR Peak (ms) Norm Peak (ms) O-P Amp (V) Norm O-P Amp  Left Sup Peroneal Anti Sensory (Ant Lat Mall)  32 C  12 cm *NR  <4.6  >3  Left Sural Anti Sensory (Lat Mall)  32 C  Calf *NR  <4.6  >3     Stim Site NR Onset (ms) Norm Onset (ms) O-P Amp (mV) Norm O-P Amp Site1 Site2 Delta-0 (ms) Dist (cm) Vel (m/s) Norm Vel (m/s)  Left Peroneal Motor (Ext Dig Brev)  32 C  Ankle *NR  <6.0  >2.5 B Fib Ankle  0.0  >40  B Fib *NR     Poplt B Fib  0.0  >40  Poplt *NR            Left Peroneal TA Motor (Tib Ant)  32 C  Fib Head    3.2 <4.5 5.8 >3 Poplit Fib Head 1.0 7.0 70 >40  Poplit    4.2 <5.7 5.8         Left Tibial Motor (Abd Hall Brev)  32 C  Ankle *NR  <6.0  >4 Knee Ankle  0.0  >40  Knee *NR              Electromyography   Side Muscle Ins.Act Fibs Fasc Recrt Amp Dur Poly Activation Comment  Left AntTibialis Nml Nml Nml *1- *1+ *1+ *1+ Nml N/A  Left Gastroc Nml Nml Nml *1- *1+ *1+ *1+ Nml N/A  Left Flex Dig Long Nml Nml Nml *1- *1+ *1+ *1+ Nml N/A  Left BicepsFemS Nml Nml Nml Nml Nml Nml Nml Nml N/A  Left RectFemoris Nml Nml Nml Nml Nml Nml Nml Nml N/A  Left GluteusMed Nml Nml Nml Nml Nml Nml Nml Nml N/A      Waveforms:
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
## 2023-03-13 ENCOUNTER — Ambulatory Visit (INDEPENDENT_AMBULATORY_CARE_PROVIDER_SITE_OTHER): Payer: 59 | Admitting: Orthopedic Surgery

## 2023-03-13 DIAGNOSIS — M25552 Pain in left hip: Secondary | ICD-10-CM | POA: Diagnosis not present

## 2023-03-14 NOTE — Progress Notes (Signed)
Orthopedic Spine Surgery Office Note   Assessment: Patient is a 68 y.o. female with low back pain that radiates into the left buttock and left posterior proximal thigh.  Also complaining of left hip locking and giving way     Plan: -Patient has tried PT, tylenol, oral steroids, gabapentin, lumbar steroid injections  -I went over her EMG/NCS that showed evidence of neuropathy.  I told her that could explain her leg and buttock pain since her MRI in the past has not shown significant stenosis to explain her pain and she has not responded to injections -For her hip, I provided her with some exercises to work on glutes strengthening.  I also told her to use Tylenol up to 1000 mg a day to control the pain -If her hip is not doing any better in the next couple of weeks, we will get an MRI to evaluate further -Would need a BMI of 40 or less prior to any elective spine surgery -Patient should return to office on an as-needed basis     Patient expressed understanding of the plan and all questions were answered to the patient's satisfaction.    ___________________________________________________________________________     History:   Patient is a 68 y.o. female who presents today for follow-up on her lumbar spine.  She is still having left buttock and left posterior thigh pain.  She completed her EMG/NCS.  She has not had any changes in the symptoms since she was last seen in the office.  She has developed though pain and popping and giving way of her left leg.  She feels the pain and popping in her hip.  She notices it when she is putting weight on the hip.  Sometimes she feels as if the hip gives out on her and her leg gives way.  She has not had any falls.  She does not have any of the symptoms in her right hip.  There is been no recent trauma or injury that preceded the onset of the symptoms.   Treatments tried: PT, tylenol, oral steroids, gabapentin, lumbar steroid injections     Physical  Exam:   General: no acute distress, appears stated age Neurologic: alert, answering questions appropriately, following commands Respiratory: unlabored breathing on room air, symmetric chest rise Psychiatric: appropriate affect, normal cadence to speech     MSK (spine):   -Strength exam                                                   Left                  Right EHL                              5/5                  5/5 TA                                 5/5                  5/5 GSC  5/5                  5/5 Knee extension            5/5                  5/5 Hip flexion                    5/5                  5/5   -Sensory exam                           Sensation intact to light touch in L3-S1 nerve distributions of bilateral lower extremities   -Achilles DTR: 1/4 on the left, 1/4 on the right -Patellar tendon DTR: 1/4 on the left, 1/4 on the right   -Straight leg raise: negative bilaterally -Femoral nerve stretch test: negative bilaterally -Clonus: no beats bilaterally   -Left hip exam: Positive Stinchfield, pain with FADIR, no pain through remainder of range of motion, negative FABER -Right hip exam: no pain through range of motion, negative stinchfield, negative faber   Imaging: XRs of the lumbar spine from 01/05/2023 were independently reviewed and interpreted, showing no significant degenerative changes. Lordotic alignment. No fracture or dislocation seen. No evidence of instability on flexion/extension views.    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 No other significant stenosis seen.   XRs of the left hip from 02/09/2023 were independently reviewed and interpreted, showing total hip arthroplasty in good position.  No lucency seen around the cup or stem.  No periprosthetic fracture seen.  No  dislocation seen.  Early degenerative changes seen within the right hip joint.     Patient name: Katrina Vega Patient MRN: [id number] Date of visit: 03/12/2022

## 2023-03-16 ENCOUNTER — Ambulatory Visit (INDEPENDENT_AMBULATORY_CARE_PROVIDER_SITE_OTHER): Payer: 59 | Admitting: Rehabilitative and Restorative Service Providers"

## 2023-03-16 ENCOUNTER — Ambulatory Visit: Payer: 59 | Admitting: Orthopedic Surgery

## 2023-03-16 ENCOUNTER — Encounter: Payer: Self-pay | Admitting: Rehabilitative and Restorative Service Providers"

## 2023-03-16 DIAGNOSIS — M25531 Pain in right wrist: Secondary | ICD-10-CM

## 2023-03-16 DIAGNOSIS — M25641 Stiffness of right hand, not elsewhere classified: Secondary | ICD-10-CM | POA: Diagnosis not present

## 2023-03-16 DIAGNOSIS — R278 Other lack of coordination: Secondary | ICD-10-CM

## 2023-03-16 DIAGNOSIS — M25631 Stiffness of right wrist, not elsewhere classified: Secondary | ICD-10-CM

## 2023-03-16 DIAGNOSIS — R600 Localized edema: Secondary | ICD-10-CM | POA: Diagnosis not present

## 2023-03-16 DIAGNOSIS — M6281 Muscle weakness (generalized): Secondary | ICD-10-CM
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
## 2023-03-19 ENCOUNTER — Ambulatory Visit: Payer: 59 | Admitting: Rehabilitative and Restorative Service Providers"

## 2023-03-19 ENCOUNTER — Encounter: Payer: Self-pay | Admitting: Rehabilitative and Restorative Service Providers"

## 2023-03-19 ENCOUNTER — Ambulatory Visit: Payer: 59 | Admitting: Orthopedic Surgery

## 2023-03-19 DIAGNOSIS — M6281 Muscle weakness (generalized): Secondary | ICD-10-CM

## 2023-03-19 DIAGNOSIS — M25531 Pain in right wrist: Secondary | ICD-10-CM | POA: Diagnosis not present

## 2023-03-19 DIAGNOSIS — M25641 Stiffness of right hand, not elsewhere classified: Secondary | ICD-10-CM | POA: Diagnosis not present

## 2023-03-19 DIAGNOSIS — R278 Other lack of coordination: Secondary | ICD-10-CM

## 2023-03-19 DIAGNOSIS — M25631 Stiffness of right wrist, not elsewhere classified: Secondary | ICD-10-CM | POA: Diagnosis not present

## 2023-03-19 DIAGNOSIS — R600 Localized edema: Secondary | ICD-10-CM
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
## 2023-03-24 ENCOUNTER — Telehealth (INDEPENDENT_AMBULATORY_CARE_PROVIDER_SITE_OTHER): Payer: 59 | Admitting: Family Medicine

## 2023-03-24 ENCOUNTER — Encounter: Payer: Self-pay | Admitting: Family Medicine

## 2023-03-24 ENCOUNTER — Encounter: Payer: Self-pay | Admitting: Rehabilitative and Restorative Service Providers"

## 2023-03-24 ENCOUNTER — Ambulatory Visit (INDEPENDENT_AMBULATORY_CARE_PROVIDER_SITE_OTHER): Payer: 59 | Admitting: Rehabilitative and Restorative Service Providers"

## 2023-03-24 DIAGNOSIS — M25531 Pain in right wrist: Secondary | ICD-10-CM

## 2023-03-24 DIAGNOSIS — M7918 Myalgia, other site: Secondary | ICD-10-CM

## 2023-03-24 DIAGNOSIS — M25641 Stiffness of right hand, not elsewhere classified: Secondary | ICD-10-CM

## 2023-03-24 DIAGNOSIS — M25631 Stiffness of right wrist, not elsewhere classified: Secondary | ICD-10-CM | POA: Diagnosis not present

## 2023-03-24 DIAGNOSIS — R600 Localized edema: Secondary | ICD-10-CM | POA: Diagnosis not present

## 2023-03-24 DIAGNOSIS — R278 Other lack of coordination: Secondary | ICD-10-CM

## 2023-03-24 DIAGNOSIS — M545 Low back pain, unspecified: Secondary | ICD-10-CM

## 2023-03-24 DIAGNOSIS — M6281 Muscle weakness (generalized): Secondary | ICD-10-CM

## 2023-03-24 MED ORDER — GABAPENTIN 300 MG PO CAPS: ORAL_CAPSULE | ORAL | 2 refills | Status: DC

## 2023-03-24 MED ORDER — CYCLOBENZAPRINE HCL 10 MG PO TABS: 10.0000 mg | ORAL_TABLET | Freq: Three times a day (TID) | ORAL | 0 refills | Status: DC | PRN

## 2023-03-24 NOTE — Progress Notes (Signed)
MyChart Video Visit    Virtual Visit via Video Note   This patient is at least at moderate risk for complications without adequate follow up. This format is felt to be most appropriate for this patient at this time. Physical exam was limited by quality of the video and audio technology used for the visit. heather was able to get the patient set up on a video visit.  Patient location: home Patient and provider in visit Provider location: Office  I discussed the limitations of evaluation and management by telemedicine and the availability of in person appointments. The patient expressed understanding and agreed to proceed.  Visit Date: 03/24/2023  Today's healthcare provider: Donato Schultz, DO     Subjective:    Patient ID: Katrina Vega, female    DOB: April 15, 1955, 68 y.o.   MRN: [id number]  Chief Complaint  Patient presents with  . Buttock Pain  . Follow-up    HPI Patient is in today for con't back pain .  Discussed the use of AI scribe software for clinical note transcription with the patient, who gave verbal consent to proceed.  History of Present Illness   The patient, with neuropathy, presents with worsening pain radiating from the buttocks to the knee, affecting sleep.  She experiences persistent pain originating in the buttocks and radiating down the thigh to the knee, which has worsened over time and is now severe enough to disrupt sleep. The pain is located in the buttocks, under the crease, and extends into the thigh. Attempts at stretching have not alleviated the pain.  She has a history of neuropathy, confirmed by a nerve test, and has been taking gabapentin 300 mg three times daily for years. Despite this, she continues to experience significant pain and leg buckling. She is currently taking cyclobenzaprine, which provides some relief but is insufficient for her needs.  She has a history of hip replacement on the affected side, but an x-ray of the hip was  normal. An MRI of the lumbar spine last year showed arthritic changes and stenosis. She has undergone various treatments, including physical therapy and injections, which provided minimal relief. She has not had back surgery.  She has a history of hand surgery, which required two operations due to complications with wound healing. This has impacted her ability to work, and she anticipates returning to work around February 19th. She wants to resolve her current pain issues before returning to work.       Past Medical History:  Diagnosis Date  . Asthma   . CAD S/P two-vessel DES PCI 2008   LAD and RI; 2013 PTCA of small RCA.  Marland Kitchen CHF (congestive heart failure), NYHA class I, chronic, diastolic (HCC) 2019   Recent Echo 12/16/2021: Mild concentric LVH.  EF 59%.  GI 1 DD.  Normal LAP-(Mildly dilated LA;; has converted from to torsemide and now on bumetanide  . CKD stage 3 due to type 2 diabetes mellitus (HCC)   . COPD (chronic obstructive pulmonary disease) (HCC) 2021   Complicated by asthma and seasonal allergies.  . Diabetes mellitus, type II, insulin dependent (HCC) 2010   On insulin 60 units TID Premeal.  Also on Jardiance and Ozempic.  Marland Kitchen Eczema   . Essential hypertension   . Heart attack [facility]) 2008   2008-two-vessel PCI; 2013 PTCA only small RCA  . 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 Hyperlipidemia associated with type 2 diabetes mellitus (HCC)    140 mg  rosuvastatin  . Insulin pump in place   . PAD (peripheral artery disease) (HCC)    Status post bilateral SFA stents and right posterior tibial DES stent  . Primary hypertension 01/20/2011      Patient's blood pressure is well controlled.  Continue current medications.    Past Surgical History:  Procedure Laterality Date  . AAA DUPLEX  10/09/2020   Max Aorta (sac) diameter 2.51 cm prox with mild ectasia in Prox Aorta. Diffuse plaque in mid-distal Aorta. Bilateral ICA poorly visulailzed - appear to be Severely stenosed with  difuse plaque. - Consider CTA of cath directed Angio.  . APPENDECTOMY  1974  . BIOPSY  02/22/2021   Procedure: BIOPSY;  Surgeon: Jeani Hawking, MD;  Location: [facility];  Service: Endoscopy;;  . CARPAL TUNNEL RELEASE  2017  . CARPAL TUNNEL RELEASE Right 01/01/2023   Procedure: RIGHT CARPAL TUNNEL RELEASE REVISION;  Surgeon: Samuella Cota, MD;  Location: [facility];  Service: Orthopedics;  Laterality: Right;  . COLONOSCOPY WITH PROPOFOL N/A 02/22/2021   Procedure: COLONOSCOPY WITH PROPOFOL;  Surgeon: Jeani Hawking, MD;  Location: [facility];  Service: Endoscopy;  Laterality: N/A;  . CORONARY BALLOON ANGIOPLASTY  2013   PTCA of RCA followed by PCI  . CORONARY STENT INTERVENTION  2008   Text DES PCI to LAD and RI/OM1; also prox RCA  . ESOPHAGOGASTRODUODENOSCOPY (EGD) WITH PROPOFOL N/A 02/22/2021   Procedure: ESOPHAGOGASTRODUODENOSCOPY (EGD) WITH PROPOFOL;  Surgeon: Jeani Hawking, MD;  Location: [facility];  Service: Endoscopy;  Laterality: N/A;  . LEA Dopplers  10/09/2020   R mid & Distal SFA -CTO - dampend flow in R Pop via collaterals - Moderate velocity increase in R PFA. No signficant stenosis in LLE.   R ABI 0.97) - normal. L ABI - 0.91 w/ monophasic flow in L AT -> c/w 11/2019- R SFA new.  Marland Kitchen LEFT HEART CATH AND CORONARY ANGIOGRAPHY  12/22/2017   Mild LM plaque.  Mild proximal LAD plaque.  High D1-40% ostial.  Patent mid LAD DES (Taxus from 2008), large distal LAD free disease; Prox LCx-OM1 stents patent (Taxus 2008). Small RCA - patent prox stent(~50-60% ISR), PTCA site from 2013 - patent  . LOWER EXTREMITY ANGIOGRAM Bilateral 12/22/2017   Bilateral SFA stents with evidence of severe right and mid left ISR bilateral popliteal arteries proximal trifurcation vessels are patent.  Moderate to severe lesion involving mid R AntTib, poor distal flow in L Ant Tib - 2 V runoff Bilat. --> referred for R SFA Laser Atherectomy & DCB 5 x 120 mm.  . LOWER EXTREMITY INTERVENTION  Right 12/22/2017   Laser atherectomy of right SFA followed by [facility] with 5.0 x 120 mm impact.-For severe right SFA ISR  . LUMBAR SPINE SURGERY  2010  . NM MYOVIEW LTD  07/07/2019   [facility]): [place].  Nondiagnostic EKG.  Dyspnea with effusion.  No ischemia or infarction.  Soft tissue attenuation noted.Marland Kitchen  LVEF 72%.  No RWMA.  LOW RISK.--No change from 11/2017  . POLYPECTOMY  02/22/2021   Procedure: POLYPECTOMY;  Surgeon: Jeani Hawking, MD;  Location: [facility];  Service: Endoscopy;;  . REPLACEMENT TOTAL KNEE  2017 and 2018  . TONSILLECTOMY    . TOTAL ABDOMINAL HYSTERECTOMY  1989  . TOTAL HIP ARTHROPLASTY  04/2018  . TOTAL SHOULDER ARTHROPLASTY  11/2019  . TRANSTHORACIC ECHOCARDIOGRAM  07/04/2019   Normal LV size, mild LVH, hyperdynamic LVEF at >65% with grade 1 diastolic dysfunction.  Otherwise no other significant abnormality.  Poor quality due to patient body habitus.  . TRANSTHORACIC ECHOCARDIOGRAM  12/16/2021   [facility]) normal LV size and function.  Moderate concentric LVH.  Normal WM.  EF estimated 59%.  GR 1 DD.  Mild LA dilation.  No valvular lesions.    Family History  Problem Relation Age of Onset  . Heart disease Mother   . Breast cancer Mother   . Heart attack Father   . Lung cancer Father   . Eczema Grandson   . Allergic rhinitis Neg Hx   . Angioedema Neg Hx   . Asthma Neg Hx   . Atopy Neg Hx   . Immunodeficiency Neg Hx   . Urticaria Neg Hx     Social History   Socioeconomic History  . Marital status: Single    Spouse name: Not on file  . Number of children: 5  . Years of education: Not on file  . Highest education level: Associate degree: academic program  Occupational History  . Not on file  Tobacco Use  . Smoking status: Former    Current packs/day: 0.00    Average packs/day: 0.5 packs/day for 15.0 years (7.5 ttl pk-yrs)    Types: Cigarettes    Start date: 10/13/1980    Quit date: 10/14/1995     Years since quitting: 27.4  . Smokeless tobacco: Never  Vaping Use  . Vaping status: Never Used  Substance and Sexual Activity  . Alcohol use: Not Currently  . Drug use: Not Currently  . Sexual activity: Yes  Other Topics Concern  . Not on file  Social History Narrative  . Not on file   Social Drivers of Health   Financial Resource Strain: Low Risk  (02/09/2023)   Overall Financial Resource Strain (CARDIA)   . Difficulty of Paying Living Expenses: Not hard at all  Food Insecurity: No Food Insecurity (02/09/2023)   Hunger Vital Sign   . Worried About Programme researcher, broadcasting/film/video in the Last Year: Never true   . Ran Out of Food in the Last Year: Never true  Transportation Needs: No Transportation Needs (02/09/2023)   PRAPARE - Transportation   . Lack of Transportation (Medical): No   . Lack of Transportation (Non-Medical): No  Physical Activity: Inactive (02/09/2023)   Exercise Vital Sign   . Days of Exercise per Week: 0 days   . Minutes of Exercise per Session: 10 min  Stress: No Stress Concern Present (02/09/2023)   [employer] of Occupational Health - Occupational Stress Questionnaire   . Feeling of Stress : Not at all  Social Connections: Moderately Integrated (02/09/2023)   Social Connection and Isolation Panel [NHANES]   . Frequency of Communication with Friends and Family: More than three times a week   . Frequency of Social Gatherings with Friends and Family: Once a week   . Attends Religious Services: More than 4 times per year   . Active Member of Clubs or Organizations: Yes   . Attends Banker Meetings: More than 4 times per year   . Marital Status: Never married  Intimate Partner Violence: Not At Risk (07/16/2021)   Humiliation, Afraid, Rape, and Kick questionnaire   . Fear of Current or Ex-Partner: No   . Emotionally Abused: No   . Physically Abused: No   . Sexually Abused: No    Outpatient Medications Prior to Visit  Medication Sig Dispense  Refill  . Budeson-Glycopyrrol-Formoterol (BREZTRI AEROSPHERE) 160-9-4.8 MCG/ACT AERO INHALE 2 INHALATIONS BY MOUTH  TWICE DAILY TO PREVENT COUGH OR  WHEEZE. RINSE, GARGLE, AND SPIT  AFTER USE 32.1 g 1  . budesonide (PULMICORT) 0.5 MG/2ML nebulizer solution Take 2 mLs (0.5 mg total) by nebulization in the morning and at bedtime. During respiratory infections for 1-2 weeks at a time. 120 mL 2  . bumetanide (BUMEX) 1 MG tablet TAKE 1 TABLET BY MOUTH DAILY 90 tablet 3  . cephALEXin (KEFLEX) 250 MG capsule Take 1 capsule (250 mg total) by mouth 4 (four) times daily. 28 capsule 0  . Cholecalciferol 50 MCG (2000 UT) TABS Take 2,000 Units by mouth daily.    . clopidogrel (PLAVIX) 75 MG tablet TAKE 1 TABLET BY MOUTH DAILY 100 tablet 2  . Continuous Blood Gluc Sensor (DEXCOM G6 SENSOR) MISC USE 1 SENSOR AS NEEDED CHANGE EVERY 10 DAYS    . Continuous Blood Gluc Transmit (DEXCOM G6 TRANSMITTER) MISC     . diazepam (VALIUM) 5 MG tablet Take one tablet by mouth with food one hour prior to procedure. May repeat 30 minutes prior if needed. 2 tablet 0  . empagliflozin (JARDIANCE) 10 MG TABS tablet Take 1 tablet (10 mg total) by mouth daily before breakfast. 30 tablet 3  . EPINEPHrine (EPIPEN 2-PAK) 0.3 mg/0.3 mL IJ SOAJ injection Use as directed for severe allergic reactions 2 each 3  . famotidine (PEPCID) 20 MG tablet Take 20 mg by mouth 2 (two) times daily.    Marland Kitchen FASENRA PEN 30 MG/ML prefilled autoinjector INJECT 30MG  SUBCUTANEOUSLY EVERY 8 WEEKS 1 mL 6  . glucagon 1 MG injection Inject 1 mg into the muscle once as needed (low blood sugar).    Marland Kitchen glucose 4 GM chewable tablet Chew 1 tablet by mouth once as needed for low blood sugar.    Marland Kitchen HUMALOG 100 UNIT/ML injection Inject into the skin.    Marland Kitchen HYDROcodone-acetaminophen (NORCO/VICODIN) 5-325 MG tablet Take 1 tablet by mouth every 6 (six) hours as needed for up to 30 doses for moderate pain (pain score 4-6). 30 tablet 0  . Insulin Disposable Pump (OMNIPOD 5 G6 INTRO,  GEN 5,) KIT CHANGE POD EVERY 2 DAYS    . isosorbide mononitrate (IMDUR) 60 MG 24 hr tablet TAKE 1 TABLET BY MOUTH DAILY 100 tablet 2  . levalbuterol (XOPENEX HFA) 45 MCG/ACT inhaler USE 2 INHALATIONS BY MOUTH EVERY 4 TO 6 HOURS AS NEEDED FOR  SHORTNESS OF BREATH , COUGH,  WHEEZE AND TIGHTNESS IN CHEST 45 g 6  . levalbuterol (XOPENEX) 0.63 MG/3ML nebulizer solution Take 3 mLs (0.63 mg total) by nebulization every 4 (four) hours as needed for wheezing or shortness of breath (coughing fits). 75 mL 1  . levocetirizine (XYZAL) 5 MG tablet Take 1 tablet (5 mg total) by mouth every evening. 90 tablet 3  . lisinopril (ZESTRIL) 20 MG tablet Take 20 mg by mouth daily.    . metoprolol tartrate (LOPRESSOR) 50 MG tablet TAKE 1 TABLET BY MOUTH EVERY 12  HOURS 60 tablet 11  . montelukast (SINGULAIR) 10 MG tablet TAKE 1 TABLET BY MOUTH AT  BEDTIME 100 tablet 2  . nitroGLYCERIN (NITROSTAT) 0.4 MG SL tablet Place 1 tablet (0.4 mg total) under the tongue every 5 (five) minutes as needed for chest pain. 25 tablet 3  . OZEMPIC, 2 MG/DOSE, 8 MG/3ML SOPN Inject 2 mg into the skin once a week.    . pantoprazole (PROTONIX) 40 MG tablet Take 1 tablet (40 mg total) by mouth daily. (Patient taking differently: Take 40 mg by  mouth 2 (two) times daily before a meal.) 30 tablet 3  . potassium chloride (KLOR-CON) 10 MEQ tablet Take 1 tablet (10 mEq total) by mouth daily. 90 tablet 3  . predniSONE (DELTASONE) 10 MG tablet TAKE 3 TABLETS PO QD FOR 3 DAYS THEN TAKE 2 TABLETS PO QD FOR 3 DAYS THEN TAKE 1 TABLET PO QD FOR 3 DAYS THEN TAKE 1/2 TAB PO QD FOR 3 DAYS 20 tablet 0  . Respiratory Therapy Supplies (CARETOUCH 2 CPAP HOSE HANGER) MISC by Does not apply route.    . rosuvastatin (CRESTOR) 40 MG tablet TAKE 1 TABLET BY MOUTH AT  BEDTIME 100 tablet 2  . sulfamethoxazole-trimethoprim (BACTRIM DS) 800-160 MG tablet Take 1 tablet by mouth 2 (two) times daily. 14 tablet 0  . cyclobenzaprine (FLEXERIL) 10 MG tablet Take 1 tablet (10 mg  total) by mouth 3 (three) times daily as needed for muscle spasms. 30 tablet 0  . gabapentin (NEURONTIN) 300 MG capsule TAKE 1 CAPSULE BY MOUTH 3 TIMES  DAILY 300 capsule 2   Facility-Administered Medications Prior to Visit  Medication Dose Route Frequency Provider Last Rate Last Admin  . Benralizumab SOSY 30 mg  30 mg Subcutaneous Q8 Lynnae January, FNP   30 mg at 03/10/23 1035    Allergies  Allergen Reactions  . Contrast Media [Iodinated Contrast Media] Shortness Of Breath and Other (See Comments)    sob, chest tight 2024, small amount in joint injection tolerated well  . Morphine Itching  . Ioversol   . Oxycodone Other (See Comments)  . Tramadol Itching and Nausea Only    Review of Systems  Constitutional:  Negative for fever and malaise/fatigue.  HENT:  Negative for congestion.   Eyes:  Negative for blurred vision.  Respiratory:  Negative for cough and shortness of breath.   Cardiovascular:  Negative for chest pain, palpitations and leg swelling.  Gastrointestinal:  Negative for vomiting.  Musculoskeletal:  Negative for back pain.  Skin:  Negative for rash.  Neurological:  Negative for loss of consciousness and headaches.      Objective:    Physical Exam Vitals and nursing note reviewed.  Constitutional:      General: She is not in acute distress.    Appearance: Normal appearance. She is well-developed.  Neurological:     Mental Status: She is alert and oriented to person, place, and time.  Psychiatric:        Mood and Affect: Mood normal.        Behavior: Behavior normal.        Thought Content: Thought content normal.        Judgment: Judgment normal.   There were no vitals taken for this visit. Wt Readings from Last 3 Encounters:  02/09/23 253 lb 6.4 oz (114.9 kg)  02/03/23 250 lb 7.1 oz (113.6 kg)  01/05/23 250 lb 8 oz (113.6 kg)       Assessment & Plan:  Low back pain with radiation -     MR LUMBAR SPINE WO CONTRAST; Future -     Gabapentin; 1  po in am and 12 noon and 2 po at bedtime  Dispense: 360 capsule; Refill: 2  Left buttock pain -     Cyclobenzaprine HCl; Take 1 tablet (10 mg total) by mouth 3 (three) times daily as needed for muscle spasms.  Dispense: 30 tablet; Refill: 0   Assessment and Plan    Neuropathic Pain   Chronic neuropathic pain in the buttocks, thigh,  and knee is causing significant sleep disturbance. Previous MRI indicated arthritic changes and stenosis. Current medications and stretches are not effectively managing the pain. Differential diagnosis includes neuropathy and a possible gluteal tear. An MRI is needed to evaluate the lumbar spine and gluteal region, with potential insurance requirements for physical therapy first. She prefers a neurosurgeon referral if surgery is necessary. Increasing gabapentin dosage is discussed to better manage neuropathy symptoms. Plan: Order MRI of the lumbar spine and gluteal region, continue cyclobenzaprine, increase gabapentin to 300 mg in the morning, 300 mg in the afternoon, and 600 mg at night, refer to a neurologist for further evaluation of neuropathy, and discuss potential gluteal tear with MRI results.  Post-Surgical Hand Pain   Recovery from two hand surgeries is ongoing with occupational therapy. Wound healing is progressing well. She is currently out of work to ensure proper healing and wishes to complete necessary treatments before returning. Plan: Continue occupational therapy for the hand and follow up with the hand surgeon for clearance to return to work.  Diabetes Mellitus   Diabetes is managed by Dr. Carlos Levering. No specific issues were discussed during this visit. Plan: Continue current diabetes management with Dr. Carlos Levering.  Follow-up   Schedule MRI at [facility] and follow up with the primary care physician after MRI results. Ensure referral to a neurologist is completed.        I discussed the assessment and treatment plan with the patient.  The patient was provided an opportunity to ask questions and all were answered. The patient agreed with the plan and demonstrated an understanding of the instructions.   The patient was advised to call back or seek an in-person evaluation if the symptoms worsen or if the condition fails to improve as anticipated.  Donato Schultz, DO [facility] [phone] (phone) [phone] (fax)  [facility]

## 2023-03-24 NOTE — Patient Instructions (Signed)
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
## 2023-03-26 ENCOUNTER — Encounter: Payer: Self-pay | Admitting: Rehabilitative and Restorative Service Providers"

## 2023-03-26 ENCOUNTER — Ambulatory Visit: Payer: 59 | Admitting: Rehabilitative and Restorative Service Providers"

## 2023-03-26 DIAGNOSIS — R600 Localized edema: Secondary | ICD-10-CM

## 2023-03-26 DIAGNOSIS — R278 Other lack of coordination: Secondary | ICD-10-CM

## 2023-03-26 DIAGNOSIS — M25631 Stiffness of right wrist, not elsewhere classified: Secondary | ICD-10-CM

## 2023-03-26 DIAGNOSIS — M25641 Stiffness of right hand, not elsewhere classified: Secondary | ICD-10-CM | POA: Diagnosis not present

## 2023-03-26 DIAGNOSIS — M25531 Pain in right wrist: Secondary | ICD-10-CM | POA: Diagnosis not present

## 2023-03-26 DIAGNOSIS — M6281 Muscle weakness (generalized): Secondary | ICD-10-CM | POA: Diagnosis not present

## 2023-03-29 ENCOUNTER — Ambulatory Visit
Admission: RE | Admit: 2023-03-29 | Discharge: 2023-03-29 | Disposition: A | Discharge status: Home / Self Care | Payer: 59 | Source: Ambulatory Visit | Attending: Family Medicine | Admitting: Family Medicine

## 2023-03-29 DIAGNOSIS — M545 Low back pain, unspecified: Secondary | ICD-10-CM | POA: Diagnosis not present

## 2023-04-01 ENCOUNTER — Encounter: Payer: 59 | Admitting: Rehabilitative and Restorative Service Providers"

## 2023-04-02 ENCOUNTER — Encounter: Payer: Self-pay | Admitting: Family Medicine

## 2023-04-06 ENCOUNTER — Other Ambulatory Visit: Payer: Self-pay | Admitting: Family Medicine

## 2023-04-06 ENCOUNTER — Other Ambulatory Visit: Payer: Self-pay | Admitting: Cardiology

## 2023-04-06 DIAGNOSIS — K2100 Gastro-esophageal reflux disease with esophagitis, without bleeding: Secondary | ICD-10-CM

## 2023-04-08 ENCOUNTER — Encounter: Payer: 59 | Admitting: Rehabilitative and Restorative Service Providers"

## 2023-04-13 ENCOUNTER — Ambulatory Visit (INDEPENDENT_AMBULATORY_CARE_PROVIDER_SITE_OTHER): Payer: 59 | Admitting: Orthopedic Surgery

## 2023-04-13 ENCOUNTER — Encounter: Payer: Self-pay | Admitting: Family Medicine

## 2023-04-13 ENCOUNTER — Encounter: Payer: Self-pay | Admitting: Hematology & Oncology

## 2023-04-13 DIAGNOSIS — M4800 Spinal stenosis, site unspecified: Secondary | ICD-10-CM

## 2023-04-13 DIAGNOSIS — Z9889 Other specified postprocedural states: Secondary | ICD-10-CM

## 2023-04-13 NOTE — Progress Notes (Signed)
   Katrina Vega - 68 y.o. female MRN [id number]  Date of birth: August 13, 1955  Office Visit Note: Visit Date: 04/13/2023 PCP: Donato Schultz, DO Referred by: Donato Schultz, *  Subjective:  HPI: Katrina Vega is a 68 y.o. female who presents today for follow up 10 weeks status post right wrist incision and drainage of abscess  closure, s/p right wrist carpal tunnel revision.  She is doing well overall, has demonstrates appropriate wound healing at this juncture.  She is pleased with her progress.  Pertinent ROS were reviewed with the patient and found to be negative unless otherwise specified above in HPI.   Assessment & Plan: Visit Diagnoses: No diagnosis found.  Plan: I am pleased to see that she has demonstrate appropriate wound healing at this juncture.  She can begin activities as tolerated without restrictions.  Utilize wrist brace as needed moving forward.  Follow-up as needed.  Follow-up: No follow-ups on file.   Meds & Orders: No orders of the defined types were placed in this encounter.  No orders of the defined types were placed in this encounter.    Procedures: No procedures performed       Objective:   Vital Signs: There were no vitals taken for this visit.  Ortho Exam Right upper extremity incision is appropriately healing, no evidence of erythema, no active drainage on examination today Wrist range of motion is 60 degrees flexion, 50 degrees extension composite fist without restriction  sensation is intact to light touch, hypersensitivity improved around surgical site  Imaging: No results found.   Livio Ledwith Trevor Mace, M.D. [facility], Hand Surgery

## 2023-04-15 ENCOUNTER — Other Ambulatory Visit: Payer: Self-pay | Admitting: Cardiology

## 2023-04-15 ENCOUNTER — Encounter: Payer: 59 | Admitting: Rehabilitative and Restorative Service Providers"

## 2023-04-16 NOTE — Telephone Encounter (Signed)
Pt has an upcoming appt with Dr. Herbie Baltimore 05/18/23. Pt's pharmacy is requesting refills on medication isosorbide, lisinopril and Clopidogrel. Pt has saw Dr. Jacinto Halim in the past. Would Dr. Herbie Baltimore like to refill these medication until appt time? Please address

## 2023-04-17 ENCOUNTER — Encounter: Payer: Self-pay | Admitting: Family Medicine

## 2023-04-17 DIAGNOSIS — N1832 Chronic kidney disease, stage 3b: Secondary | ICD-10-CM

## 2023-04-17 NOTE — Telephone Encounter (Signed)
Called pt to inform her that we would send in a 30 day supply of her medications requested by her pharmacy and pt stated that she had enough medication of all three and she would wait until she came in to see Dr. Herbie Baltimore on 05/18/23 appt. I advised the pt that if she has any other problems, questions or concerns, to give our office a call. Pt verbalized understanding. FYI

## 2023-04-21 ENCOUNTER — Encounter: Payer: Self-pay | Admitting: Hematology & Oncology

## 2023-04-22 ENCOUNTER — Encounter: Payer: 59 | Admitting: Rehabilitative and Restorative Service Providers"

## 2023-04-30 ENCOUNTER — Ambulatory Visit: Payer: 59 | Attending: Family Medicine

## 2023-04-30 DIAGNOSIS — R252 Cramp and spasm: Secondary | ICD-10-CM | POA: Insufficient documentation

## 2023-04-30 DIAGNOSIS — G8929 Other chronic pain: Secondary | ICD-10-CM | POA: Insufficient documentation

## 2023-04-30 DIAGNOSIS — M6281 Muscle weakness (generalized): Secondary | ICD-10-CM | POA: Insufficient documentation

## 2023-04-30 DIAGNOSIS — M5442 Lumbago with sciatica, left side: Secondary | ICD-10-CM | POA: Insufficient documentation

## 2023-05-01 ENCOUNTER — Other Ambulatory Visit: Payer: Self-pay

## 2023-05-01 DIAGNOSIS — D509 Iron deficiency anemia, unspecified: Secondary | ICD-10-CM

## 2023-05-04 ENCOUNTER — Encounter: Payer: Self-pay | Admitting: Family

## 2023-05-04 ENCOUNTER — Inpatient Hospital Stay: Payer: 59 | Attending: Hematology & Oncology

## 2023-05-04 ENCOUNTER — Inpatient Hospital Stay (HOSPITAL_BASED_OUTPATIENT_CLINIC_OR_DEPARTMENT_OTHER): Payer: 59 | Admitting: Family

## 2023-05-04 ENCOUNTER — Encounter: Payer: Self-pay | Admitting: Orthopedic Surgery

## 2023-05-04 VITALS — BP 128/74 | HR 93 | Temp 98.1°F | Resp 18 | Wt 256.0 lb

## 2023-05-04 DIAGNOSIS — D509 Iron deficiency anemia, unspecified: Secondary | ICD-10-CM | POA: Insufficient documentation

## 2023-05-04 LAB — CBC WITH DIFFERENTIAL (CANCER CENTER ONLY)
Abs Immature Granulocytes: 0.05 10*3/uL (ref 0.00–0.07)
Basophils Absolute: 0 10*3/uL (ref 0.0–0.1)
Basophils Relative: 0 %
Eosinophils Absolute: 0 10*3/uL (ref 0.0–0.5)
Eosinophils Relative: 0 %
HCT: 41.4 % (ref 36.0–46.0)
Hemoglobin: 13.2 g/dL (ref 12.0–15.0)
Immature Granulocytes: 1 %
Lymphocytes Relative: 38 %
Lymphs Abs: 2.4 10*3/uL (ref 0.7–4.0)
MCH: 26.5 pg (ref 26.0–34.0)
MCHC: 31.9 g/dL (ref 30.0–36.0)
MCV: 83.1 fL (ref 80.0–100.0)
Monocytes Absolute: 0.6 10*3/uL (ref 0.1–1.0)
Monocytes Relative: 10 %
Neutro Abs: 3.2 10*3/uL (ref 1.7–7.7)
Neutrophils Relative %: 51 %
Platelet Count: 295 10*3/uL (ref 150–400)
RBC: 4.98 MIL/uL (ref 3.87–5.11)
RDW: 16.6 % — ABNORMAL HIGH (ref 11.5–15.5)
WBC Count: 6.3 10*3/uL (ref 4.0–10.5)
nRBC: 0 % (ref 0.0–0.2)

## 2023-05-04 LAB — CMP (CANCER CENTER ONLY)
ALT: 22 U/L (ref 0–44)
AST: 19 U/L (ref 15–41)
Albumin: 4.6 g/dL (ref 3.5–5.0)
Alkaline Phosphatase: 66 U/L (ref 38–126)
Anion gap: 10 (ref 5–15)
BUN: 15 mg/dL (ref 8–23)
CO2: 24 mmol/L (ref 22–32)
Calcium: 10 mg/dL (ref 8.9–10.3)
Chloride: 106 mmol/L (ref 98–111)
Creatinine: 1.02 mg/dL — ABNORMAL HIGH (ref 0.44–1.00)
GFR, Estimated: 60 mL/min — ABNORMAL LOW (ref >60)
Glucose, Bld: 132 mg/dL — ABNORMAL HIGH (ref 70–99)
Potassium: 3.8 mmol/L (ref 3.5–5.1)
Sodium: 140 mmol/L (ref 135–145)
Total Bilirubin: 0.4 mg/dL (ref 0.0–1.2)
Total Protein: 7.4 g/dL (ref 6.5–8.1)

## 2023-05-04 LAB — RETICULOCYTES
Immature Retic Fract: 24.9 % — ABNORMAL HIGH (ref 2.3–15.9)
RBC.: 5.04 MIL/uL (ref 3.87–5.11)
Retic Count, Absolute: 115.9 10*3/uL (ref 19.0–186.0)
Retic Ct Pct: 2.3 % (ref 0.4–3.1)

## 2023-05-04 LAB — FERRITIN: Ferritin: 33 ng/mL (ref 11–307)

## 2023-05-04 NOTE — Progress Notes (Unsigned)
 Hematology and Oncology Follow Up Visit  ROSHINI FULWIDER [id number] January 09, 1956 68 y.o. 05/04/2023   Principle Diagnosis:  Iron deficiency anemia - unknown cause   Current Therapy:        IV iron as indicated    Interim History:  Ms. Pherigo is here today for follow-up. She is doing well but notes that after receiving Venofer she did not feel an improvement in fatigue. She states that she felt like the Injectafer she received while in [place] was more effective. We will see if this can be changed.  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 She has been taking Neruontin but states that she still has persistent lower back pain that radiates into both legs and knees.  Thankfully she has not had any falls or syncope.  No fever, chills, n/v, cough, rash, dizziness, SOB, chest pain, palpitations, abdominal pain or changes in bowel or bladder habits.  Appetite and hydration are good. Weight is stable at 256 lbs.   ECOG Performance Status: 1 - Symptomatic but completely ambulatory  Medications:  Allergies as of 05/04/2023       Reactions   Contrast Media [iodinated Contrast Media] Shortness Of Breath, Other (See Comments)   sob, chest tight 2024, small amount in joint injection tolerated well   Morphine Itching   Ioversol    Oxycodone Other (See Comments)   Tramadol Itching, Nausea Only        Medication List        Accurate as of May 04, 2023  3:12 PM. If you have any questions, ask your nurse or doctor.          Breztri Aerosphere 160-9-4.8 MCG/ACT Aero Generic drug: Budeson-Glycopyrrol-Formoterol INHALE 2 INHALATIONS BY MOUTH  TWICE DAILY TO PREVENT COUGH OR  WHEEZE. RINSE, GARGLE, AND SPIT  AFTER USE   budesonide 0.5 MG/2ML nebulizer solution Commonly known as: Pulmicort Take 2 mLs (0.5 mg total) by nebulization in the morning and at bedtime. During respiratory infections for 1-2 weeks at a time.   bumetanide 1 MG  tablet Commonly known as: BUMEX TAKE 1 TABLET BY MOUTH DAILY   CareTouch 2 CPAP Hose [employer] Misc by Does not apply route.   cephALEXin 250 MG capsule Commonly known as: KEFLEX Take 1 capsule (250 mg total) by mouth 4 (four) times daily.   Cholecalciferol 50 MCG (2000 UT) Tabs Take 2,000 Units by mouth daily.   clopidogrel 75 MG tablet Commonly known as: PLAVIX TAKE 1 TABLET BY MOUTH DAILY   cyclobenzaprine 10 MG tablet Commonly known as: FLEXERIL Take 1 tablet (10 mg total) by mouth 3 (three) times daily as needed for muscle spasms.   Dexcom G6 Sensor Misc USE 1 SENSOR AS NEEDED CHANGE EVERY 10 DAYS   Dexcom G6 Transmitter Misc   diazepam 5 MG tablet Commonly known as: VALIUM Take one tablet by mouth with food one hour prior to procedure. May repeat 30 minutes prior if needed.   empagliflozin 10 MG Tabs tablet Commonly known as: Jardiance Take 1 tablet (10 mg total) by mouth daily before breakfast.   EPINEPHrine 0.3 mg/0.3 mL Soaj injection Commonly known as: EpiPen 2-Pak Use as directed for severe allergic reactions   famotidine 20 MG tablet Commonly known as: PEPCID Take 20 mg by mouth 2 (two) times daily.   Fasenra Pen 30 MG/ML prefilled autoinjector Generic drug: benralizumab INJECT 30MG  SUBCUTANEOUSLY EVERY 8 WEEKS   gabapentin 300 MG capsule Commonly  known as: NEURONTIN 1 po in am and 12 noon and 2 po at bedtime   glucagon 1 MG injection Inject 1 mg into the muscle once as needed (low blood sugar).   glucose 4 GM chewable tablet Chew 1 tablet by mouth once as needed for low blood sugar.   HumaLOG 100 UNIT/ML injection Generic drug: insulin lispro Inject into the skin.   HYDROcodone-acetaminophen 5-325 MG tablet Commonly known as: NORCO/VICODIN Take 1 tablet by mouth every 6 (six) hours as needed for up to 30 doses for moderate pain (pain score 4-6).   isosorbide mononitrate 60 MG 24 hr tablet Commonly known as: IMDUR TAKE 1 TABLET BY MOUTH  DAILY   levalbuterol 0.63 MG/3ML nebulizer solution Commonly known as: Xopenex Take 3 mLs (0.63 mg total) by nebulization every 4 (four) hours as needed for wheezing or shortness of breath (coughing fits).   levalbuterol 45 MCG/ACT inhaler Commonly known as: XOPENEX HFA USE 2 INHALATIONS BY MOUTH EVERY 4 TO 6 HOURS AS NEEDED FOR  SHORTNESS OF BREATH , COUGH,  WHEEZE AND TIGHTNESS IN CHEST   levocetirizine 5 MG tablet Commonly known as: XYZAL Take 1 tablet (5 mg total) by mouth every evening.   lisinopril 20 MG tablet Commonly known as: ZESTRIL Take 20 mg by mouth daily.   metoprolol tartrate 50 MG tablet Commonly known as: LOPRESSOR Take 1 tablet (50 mg total) by mouth every 12 (twelve) hours. Pt needs to keep upcoming appt in Mar to avoid any missed doses   montelukast 10 MG tablet Commonly known as: SINGULAIR TAKE 1 TABLET BY MOUTH AT  BEDTIME   nitroGLYCERIN 0.4 MG SL tablet Commonly known as: NITROSTAT Place 1 tablet (0.4 mg total) under the tongue every 5 (five) minutes as needed for chest pain.   Omnipod 5 DexG7G6 Intro Gen 5 Kit CHANGE POD EVERY 2 DAYS   Ozempic (2 MG/DOSE) 8 MG/3ML Sopn Generic drug: Semaglutide (2 MG/DOSE) Inject 2 mg into the skin once a week.   pantoprazole 40 MG tablet Commonly known as: PROTONIX TAKE 1 TABLET BY MOUTH IN THE  MORNING AND AT BEDTIME   potassium chloride 10 MEQ tablet Commonly known as: KLOR-CON Take 1 tablet (10 mEq total) by mouth daily.   predniSONE 10 MG tablet Commonly known as: DELTASONE TAKE 3 TABLETS PO QD FOR 3 DAYS THEN TAKE 2 TABLETS PO QD FOR 3 DAYS THEN TAKE 1 TABLET PO QD FOR 3 DAYS THEN TAKE 1/2 TAB PO QD FOR 3 DAYS   rosuvastatin 40 MG tablet Commonly known as: CRESTOR TAKE 1 TABLET BY MOUTH AT  BEDTIME   sulfamethoxazole-trimethoprim 800-160 MG tablet Commonly known as: BACTRIM DS Take 1 tablet by mouth 2 (two) times daily.        Allergies:  Allergies  Allergen Reactions   Contrast Media  [Iodinated Contrast Media] Shortness Of Breath and Other (See Comments)    sob, chest tight 2024, small amount in joint injection tolerated well   Morphine Itching   Ioversol    Oxycodone Other (See Comments)   Tramadol Itching and Nausea Only    Past Medical History, Surgical history, Social history, and Family History were reviewed and updated.  Review of Systems: All other 10 point review of systems is negative.   Physical Exam:  weight is 256 lb (116.1 kg). Her oral temperature is 98.1 F (36.7 C). Her blood pressure is 128/74 and her pulse is 93. Her respiration is 18 and oxygen saturation is 98%.  Wt Readings from Last 3 Encounters:  05/04/23 256 lb (116.1 kg)  02/09/23 253 lb 6.4 oz (114.9 kg)  02/03/23 250 lb 7.1 oz (113.6 kg)    Ocular: Sclerae unicteric, pupils equal, round and reactive to light Ear-nose-throat: Oropharynx clear, dentition fair Lymphatic: No cervical or supraclavicular adenopathy Lungs no rales or rhonchi, good excursion bilaterally Heart regular rate and rhythm, no murmur appreciated Abd soft, nontender, positive bowel sounds MSK no focal spinal tenderness, no joint edema Neuro: non-focal, well-oriented, appropriate affect Breasts: Deferred   Lab Results  Component Value Date   WBC 6.3 05/04/2023   HGB 13.2 05/04/2023   HCT 41.4 05/04/2023   MCV 83.1 05/04/2023   PLT 295 05/04/2023   Lab Results  Component Value Date   FERRITIN 20 12/02/2022   IRON 69 12/02/2022   TIBC 451 (H) 12/02/2022   UIBC 382 12/02/2022   IRONPCTSAT 15 12/02/2022   Lab Results  Component Value Date   RETICCTPCT 2.3 05/04/2023   RBC 4.98 05/04/2023   RBC 5.04 05/04/2023   No results found for: "KPAFRELGTCHN", "LAMBDASER", "KAPLAMBRATIO" No results found for: "IGGSERUM", "IGA", "IGMSERUM" No results found for: "TOTALPROTELP", "ALBUMINELP", "A1GS", "A2GS", "BETS", "BETA2SER", "GAMS", "MSPIKE", "SPEI"   Chemistry      Component Value Date/Time   NA 140  02/02/2023 1514   K 3.8 02/02/2023 1514   CL 106 02/02/2023 1514   CO2 24 02/02/2023 1514   BUN 17 02/02/2023 1514   CREATININE 1.31 (H) 02/02/2023 1514   CREATININE 0.99 02/12/2021 0849      Component Value Date/Time   CALCIUM 10.1 02/02/2023 1514   ALKPHOS 66 09/23/2022 0909   AST 12 09/23/2022 0909   AST 13 (L) 02/12/2021 0849   ALT 11 09/23/2022 0909   ALT 13 02/12/2021 0849   BILITOT 0.7 09/23/2022 0909   BILITOT 0.5 02/12/2021 0849       Impression and Plan: Ms. Trotter is a very pleasant 68 yo African American female with long history of iron deficiency anemia.  Iron studies are pending. We will look into switching her back to [facility] if possible.  Follow-up in 6 months.   Eileen Stanford, NP 3/10/20253:12 PM

## 2023-05-05 ENCOUNTER — Other Ambulatory Visit: Payer: Self-pay | Admitting: Family

## 2023-05-05 ENCOUNTER — Ambulatory Visit: Payer: 59 | Admitting: *Deleted

## 2023-05-05 ENCOUNTER — Encounter: Payer: Self-pay | Admitting: Hematology & Oncology

## 2023-05-05 DIAGNOSIS — J4550 Severe persistent asthma, uncomplicated: Secondary | ICD-10-CM

## 2023-05-05 LAB — IRON AND IRON BINDING CAPACITY (CC-WL,HP ONLY)
Iron: 53 ug/dL (ref 28–170)
Saturation Ratios: 13 % (ref 10.4–31.8)
TIBC: 421 ug/dL (ref 250–450)
UIBC: 368 ug/dL (ref 148–442)

## 2023-05-06 ENCOUNTER — Ambulatory Visit: Payer: 59 | Admitting: Pulmonary Disease

## 2023-05-08 ENCOUNTER — Other Ambulatory Visit: Payer: Self-pay | Admitting: Cardiology

## 2023-05-08 ENCOUNTER — Telehealth: Payer: Self-pay | Admitting: *Deleted

## 2023-05-08 MED ORDER — LOSARTAN POTASSIUM 25 MG PO TABS: 25.0000 mg | ORAL_TABLET | Freq: Every day | ORAL | 3 refills | Status: DC

## 2023-05-08 NOTE — Telephone Encounter (Signed)
 Patient's pharmacy is requesting refill for Lisinopril 20 mg. Per Dr. Verna Czech office note in September 2024. Patient was switched from Lisinopril 20 mg to Losartan 25 mg. Medication change was not made on snapshot. Please verify. Thank you

## 2023-05-08 NOTE — Telephone Encounter (Signed)
 Called pt's pharmacy, cancelled Lisinopril per Dr. Verna Czech last office note and sent Losartan 25 mg once daily.

## 2023-05-11 ENCOUNTER — Other Ambulatory Visit (INDEPENDENT_AMBULATORY_CARE_PROVIDER_SITE_OTHER)

## 2023-05-11 ENCOUNTER — Encounter: Payer: Self-pay | Admitting: Family

## 2023-05-11 ENCOUNTER — Encounter: Payer: Self-pay | Admitting: Allergy

## 2023-05-11 ENCOUNTER — Ambulatory Visit (INDEPENDENT_AMBULATORY_CARE_PROVIDER_SITE_OTHER): Admitting: Orthopedic Surgery

## 2023-05-11 DIAGNOSIS — M79642 Pain in left hand: Secondary | ICD-10-CM | POA: Diagnosis not present

## 2023-05-11 DIAGNOSIS — M25532 Pain in left wrist: Secondary | ICD-10-CM

## 2023-05-11 DIAGNOSIS — M1812 Unilateral primary osteoarthritis of first carpometacarpal joint, left hand: Secondary | ICD-10-CM | POA: Diagnosis not present

## 2023-05-11 DIAGNOSIS — M47816 Spondylosis without myelopathy or radiculopathy, lumbar region: Secondary | ICD-10-CM | POA: Diagnosis not present

## 2023-05-11 NOTE — Progress Notes (Unsigned)
   Katrina Vega - 68 y.o. female MRN [id number]  Date of birth: August 02, 1955  Office Visit Note: Visit Date: 05/11/2023 PCP: Donato Schultz, DO Referred by: Seabron Spates R, *  Subjective:  HPI: Katrina Vega is a 68 y.o. female who presents today for evaluation of ongoing pain at the basilar aspect of the left thumb with associated swelling.  She has a recent history of right open carpal tunnel revision with subsequent irrigation debridement, she is recovering well from the right side.  Has not undergone any formalized treatment for the left thumb at this point.  Pertinent ROS were reviewed with the patient and found to be negative unless otherwise specified above in HPI.   Assessment & Plan: Visit Diagnoses:  1. Arthritis of carpometacarpal (CMC) joint of left thumb   2. Pain in left wrist     Plan: Extensive discussion was had with the patient today regarding her ongoing left thumb CMC osteoarthritis.  X-rays were reviewed in detail today which do show degenerative change at the left thumb [facility] articulation with moderate joint subluxation.  We discussed treatment modalities ranging from conservative to surgical.  From a conservative standpoint we discussed bracing, injections, anti-inflammatory medications and activity modification.  From a surgical standpoint we discussed the possibility of [facility] arthroplasty, risks and benefits as well as the postoperative protocol.  At this juncture, she would like to begin with bracing and injection of the left thumb [facility] articulation which is appropriate.  She was fitted for a Comfort Cool brace today.  Cortisone injection was provided to the left thumb CMC interval after all risks and benefits were discussed in detail.  Injection was tolerated well.  She can follow-up with me in approximately 6 to 8 weeks or on an as-needed basis for repeat evaluation and further discussion.  I am pleased to see that her right side is healed appropriately and  she has resumed her activities without significant restriction.   Follow-up: No follow-ups on file.   Meds & Orders: No orders of the defined types were placed in this encounter.   Orders Placed This Encounter  Procedures   Hand/UE Inj   XR Wrist Complete Left     Procedures: Hand/UE Inj: L thumb CMC for osteoarthritis on 05/12/2023 9:20 AM Details: 25 G needle Medications: 1 mL lidocaine 1 %; 6 mg betamethasone acetate-betamethasone sodium phosphate 6 (3-3) MG/ML Outcome: tolerated well, no immediate complications          Objective:   Vital Signs: There were no vitals taken for this visit.  Ortho Exam Right upper extremity incision is appropriately healing, no evidence of erythema, no active drainage on examination today Wrist range of motion is 60 degrees flexion, 50 degrees extension composite fist without restriction  sensation is intact to light touch, hypersensitivity improved around surgical site  Left hand: - Tenderness associated at the thumb CMC interval, positive grind for pain and crepitus, minimal MP hyperextension - Sensation is intact distally throughout the hand and thumb, EPL/FPL fully intact - AIN/PIN/interosseous all intact  Imaging: XR Wrist Complete Left Result Date: 05/12/2023 X-rays of the left wrist including pantrapezial view were obtained today X-rays demonstrate degenerative change at the thumb [facility] interval with joint space narrowing and osteophyte formation, associated subchondral sclerosis is appreciated as well.  There is evidence of joint subluxation appreciated.    Ellajane Stong Trevor Mace, M.D. [facility], Hand Surgery

## 2023-05-12 ENCOUNTER — Encounter: Payer: Self-pay | Admitting: Acute Care

## 2023-05-12 ENCOUNTER — Ambulatory Visit: Payer: 59 | Admitting: Acute Care

## 2023-05-12 VITALS — BP 120/76 | HR 76 | Ht 65.0 in | Wt 255.0 lb

## 2023-05-12 DIAGNOSIS — J45901 Unspecified asthma with (acute) exacerbation: Secondary | ICD-10-CM | POA: Diagnosis not present

## 2023-05-12 DIAGNOSIS — M1812 Unilateral primary osteoarthritis of first carpometacarpal joint, left hand: Secondary | ICD-10-CM | POA: Diagnosis not present

## 2023-05-12 DIAGNOSIS — J4541 Moderate persistent asthma with (acute) exacerbation: Secondary | ICD-10-CM

## 2023-05-12 MED ORDER — AZITHROMYCIN 250 MG PO TABS
ORAL_TABLET | ORAL | 0 refills | Status: DC
Start: 2023-05-12 — End: 2023-05-19

## 2023-05-12 MED ORDER — LIDOCAINE HCL 1 % IJ SOLN
1.0000 mL | INTRAMUSCULAR | Status: AC | PRN
Start: 2023-05-12 — End: 2023-05-12
  Administered 2023-05-12: 1 mL

## 2023-05-12 MED ORDER — PREDNISONE 10 MG PO TABS: ORAL_TABLET | ORAL | 0 refills | Status: DC

## 2023-05-12 MED ORDER — BETAMETHASONE SOD PHOS & ACET 6 (3-3) MG/ML IJ SUSP
6.0000 mg | INTRAMUSCULAR | Status: AC | PRN
  Administered 2023-05-12: 6 mg via INTRA_ARTICULAR

## 2023-05-12 NOTE — Progress Notes (Unsigned)
 Skin testing note  RE: Katrina Vega MRN: [id number] DOB: 1955/04/16 Date of Office Visit: 05/13/2023  Referring provider: Donato Schultz, * Primary care provider: Zola Button, Grayling Congress, DO  Chief Complaint: skin testing  History of Present Illness: I had the pleasure of seeing Burnis Kaser for a skin testing visit at the [facility] on 05/12/2023. She is a 68 y.o. female, who is being followed for asthma/copd, allergic rhinitis, GERD. Her previous allergy office visit was on 03/09/2023 with Dr. Selena Batten. Today is a skin testing visit.   Discussed the use of AI scribe software for clinical note transcription with the patient, who gave verbal consent to proceed.  History of Present Illness             *** Assessment and Plan: [name] is a 68 y.o. female with: Asthma-COPD overlap syndrome Past history - triggered by strong scents at work. Albuterol caused shaking/palpitations.  Interim history - Recent exposure to secondhand smoke leading to increased respiratory symptoms. Today's spirometry was unremarkable - but slightly worse than prior one.  Daily controller medication(s): Breztri 2 puffs twice a day with spacer and rinse mouth afterwards. Fasenra injections every 8 weeks. Continue Singulair (montelukast) 10mg  daily at night. During respiratory infections/flares:  Start Pulmicort (budesonide) 0.5mg  nebulizer twice a day for 1-2 weeks until your breathing symptoms return to baseline.  Pretreat with levoalbuterol 2 puffs or levoalbuterol nebulizer.  If you need to use your levoalbuterol nebulizer machine back to back within 15-30 minutes with no relief then please go to the ER/urgent care for further evaluation.  May use levoalbuterol rescue inhaler 2 puffs or nebulizer every 4 to 6 hours as needed for shortness of breath, chest tightness, coughing, and wheezing. May use levoalbuterol rescue inhaler 2 puffs 5 to 15 minutes prior to strenuous physical activities.  Monitor frequency of use - if you need to use it more than twice per week on a consistent basis let us know.    Seasonal allergic rhinitis due to pollen Allergic rhinitis due to dust mite Allergic rhinitis due to mold Allergic rhinitis due to animal dander Past history - started on AIT in [place] in 2021 [facility] Stopped AIT due to persistent coughing Interim history -  stable.  Continue environmental control measures. Use over the counter antihistamines such as Zyrtec (cetirizine), Claritin (loratadine), Allegra (fexofenadine), or Xyzal (levocetirizine) daily as needed. May take twice a day during allergy flares. May switch antihistamines every few months. Use Nasacort (triamcinolone) nasal spray 1 spray per nostril twice a day as needed for nasal congestion.  Use azelastine nasal spray 1-2 sprays per nostril twice a day as needed for runny nose/drainage. Nasal saline spray (i.e., Simply Saline) or nasal saline lavage (i.e., NeilMed) is recommended as needed and prior to medicated nasal sprays. Use refresh eye drops as needed.   Gastroesophageal reflux disease, unspecified whether esophagitis present Continue lifestyle and dietary modifications. Continue Protonix 40mg  twice day - nothing to eat or drink for 20-30 minutes afterwards.  Recommend GI evaluation.    Follow up with eye doctor regarding crusting.   Assessment and Plan              No follow-ups on file.  No orders of the defined types were placed in this encounter.  Lab Orders  No laboratory test(s) ordered today    Diagnostics: Spirometry:  Tracings reviewed. Her effort: {Blank single:19197::"Good reproducible efforts.","It was hard to get consistent efforts and  there is a question as to whether this reflects a maximal maneuver.","Poor effort, data can not be interpreted."} FVC: ***L FEV1: ***L, ***% predicted FEV1/FVC ratio: ***% Interpretation: {Blank single:19197::"Spirometry consistent with mild  obstructive disease","Spirometry consistent with moderate obstructive disease","Spirometry consistent with severe obstructive disease","Spirometry consistent with possible restrictive disease","Spirometry consistent with mixed obstructive and restrictive disease","Spirometry uninterpretable due to technique","Spirometry consistent with normal pattern","No overt abnormalities noted given today's efforts"}.  Please see scanned spirometry results for details.  Skin Testing: {Blank single:19197::"Select foods","Environmental allergy panel","Environmental allergy panel and select foods","Food allergy panel","None","Deferred due to recent antihistamines use"}. *** Results discussed with patient/family.   Previous notes and tests were reviewed. The plan was reviewed with the patient/family, and all questions/concerned were addressed.  It was my pleasure to see Katrina Vega today and participate in her care. Please feel free to contact me with any questions or concerns.  Sincerely,  Wyline Mood, DO Allergy & Immunology  [facility] office: [phone] [facility] office: [phone]

## 2023-05-12 NOTE — Progress Notes (Signed)
 History of Present Illness Katrina Vega is a 68 y.o. female Referred in March 2024 for asthma on Fasenra by Donato Schultz. She was followed by Dr. Tonia Brooms.     Synopsis 68 year old female, coronary disease, history of heart failure diastolic heart failure, CKD 3, COPD/asthma. History of hyperlipidemia patient was seen today for evaluation of shortness of breath. She has a BMI of 40. She is currently managed with her asthma from the allergy center. On inhalers as well as [facility]. Her PFTs completed there showed a reduction in her FEV1 and FVC but a normal ratio. Felt to have possible underlying restriction. She does have a history of COVID. No occupational exposures. No history of rheumatologic diseases. Last seen by Dr. Tonia Brooms 06/19/2023 for HRCT and PFT's.   This revealed patchy areas of air trapping within both lungs consistent with small airway disease. No compelling findings consistent with ILD. Also has a 2.1 cm adrenal nodule consistent with benign lesion. From respiratory complaint has no issues. Still feels some shortness of breath when she exerts herself. She has been doing well on Fasenra injections started by her allergist. Last injection was 05/05/2023. Also using her triple therapy inhaler regimen.    05/12/2023 Pt presents for follow up with an asthma flare. She has a more productive cough. Secretions are yellowish and  also thick. She denies fever. She is wheezing a bit. She uses rescue inhaler and nebs ( albuterol) , and Pulmicort nebs.  She does feel tight. She does follow up with allergy tomorrow. They are retesting for allergies. She plans to restart her allergy shots in addition to her biologic, as she did well when she was on both.She tried to do Feno , but was unable after several attempts.  Test Results: No recent axial CT imaging.   October 2023 chest x-ray: Unchanged ill-defined opacities in the right base, likely atelectasis The patient's images have been  independently reviewed by me.     HRCT imaging April 2024: No significant parenchymal disease identified. The patient's images have been independently reviewed by me.    PFT 07/2022 impaired spirometry and a low DLCO . No significant evidence of interstitial lung disease on CT imaging but she does have areas of air trapping and small airway disease which is likely related to her obesity and history of asthma.   PFT     Latest Ref Rng & Units 05/04/2023    2:32 PM 12/02/2022    2:41 PM 09/23/2022    9:09 AM  CBC  WBC 4.0 - 10.5 K/uL 6.3  6.5  4.9   Hemoglobin 12.0 - 15.0 g/dL 19.1  47.8  29.5   Hematocrit 36.0 - 46.0 % 41.4  40.8  41.2   Platelets 150 - 400 K/uL 295  273  300.0        Latest Ref Rng & Units 05/04/2023    2:32 PM 02/02/2023    3:14 PM 12/29/2022    3:00 PM  BMP  Glucose 70 - 99 mg/dL 621  308  99   BUN 8 - 23 mg/dL 15  17  13    Creatinine 0.44 - 1.00 mg/dL 6.57  8.46  9.62   Sodium 135 - 145 mmol/L 140  140  141   Potassium 3.5 - 5.1 mmol/L 3.8  3.8  4.2   Chloride 98 - 111 mmol/L 106  106  104   CO2 22 - 32 mmol/L 24  24  25    Calcium 8.9 -  10.3 mg/dL 16.1  09.6  04.5     BNP    Component Value Date/Time   BNP 8.7 12/20/2021 0002    ProBNP No results found for: "PROBNP"  PFT    Component Value Date/Time   FEV1PRE 1.78 06/19/2022 1451   FEV1POST 1.57 06/19/2022 1451   FVCPRE 2.00 06/19/2022 1451   FVCPOST 1.90 06/19/2022 1451   TLC 4.81 06/19/2022 1451   DLCOUNC 12.35 06/19/2022 1451   PREFEV1FVCRT 89 06/19/2022 1451   PSTFEV1FVCRT 82 06/19/2022 1451    XR Wrist Complete Left Result Date: 05/12/2023 X-rays of the left wrist including pantrapezial view were obtained today X-rays demonstrate degenerative change at the thumb [facility] interval with joint space narrowing and osteophyte formation, associated subchondral sclerosis is appreciated as well.  There is evidence of joint subluxation appreciated.    Past medical hx Past Medical History:   Diagnosis Date   Asthma    CAD S/P two-vessel DES PCI 2008   LAD and RI; 2013 PTCA of small RCA.   CHF (congestive heart failure), NYHA class I, chronic, diastolic (HCC) 2019   Recent Echo 12/16/2021: Mild concentric LVH.  EF 59%.  GI 1 DD.  Normal LAP-(Mildly dilated LA;; has converted from to torsemide and now on bumetanide   CKD stage 3 due to type 2 diabetes mellitus (HCC)    COPD (chronic obstructive pulmonary disease) (HCC) 2021   Complicated by asthma and seasonal allergies.   Diabetes mellitus, type II, insulin dependent (HCC) 2010   On insulin 60 units TID Premeal.  Also on Jardiance and Ozempic.   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  Continue current medications.     Social History   Tobacco Use   Smoking status: Former    Current packs/day: 0.00    Average packs/day: 0.5 packs/day for 15.0 years (7.5 ttl pk-yrs)    Types: Cigarettes    Start date: 10/13/1980    Quit date: 10/14/1995    Years since quitting: 27.5    Passive exposure: Current   Smokeless tobacco: Never  Vaping Use   Vaping status: Never Used  Substance Use Topics   Alcohol use: Not Currently   Drug use: Not Currently    [name] reports that she quit smoking about 27 years ago. Her smoking use included cigarettes. She started smoking about 42 years ago. She has a 7.5 pack-year smoking history. She has been exposed to tobacco smoke. She has never used smokeless tobacco. She reports that she does not currently use alcohol. She reports that she does not currently use drugs.  Tobacco  Cessation: Counseling given: Not Answered Former smoker   Past surgical hx, Family hx, Social hx all reviewed.  Current Outpatient Medications on File Prior to Visit  Medication Sig   Budeson-Glycopyrrol-Formoterol (BREZTRI AEROSPHERE) 160-9-4.8 MCG/ACT AERO INHALE 2 INHALATIONS BY MOUTH  TWICE DAILY TO PREVENT COUGH OR  WHEEZE. RINSE, GARGLE, AND SPIT  AFTER USE   budesonide (PULMICORT) 0.5 MG/2ML nebulizer solution Take 2 mLs (0.5 mg  total) by nebulization in the morning and at bedtime. During respiratory infections for 1-2 weeks at a time.   bumetanide (BUMEX) 1 MG tablet TAKE 1 TABLET BY MOUTH DAILY   Cholecalciferol 50 MCG (2000 UT) TABS Take 2,000 Units by mouth daily.   clopidogrel (PLAVIX) 75 MG tablet TAKE 1 TABLET BY MOUTH DAILY   Continuous Blood Gluc Sensor (DEXCOM G6 SENSOR) MISC USE 1 SENSOR AS NEEDED CHANGE EVERY 10 DAYS   Continuous Blood Gluc Transmit (DEXCOM G6 TRANSMITTER) MISC    empagliflozin (JARDIANCE) 10 MG TABS tablet Take 1 tablet (10 mg total) by mouth daily before breakfast.   EPINEPHrine (EPIPEN 2-PAK) 0.3 mg/0.3 mL IJ SOAJ injection Use as directed for severe allergic reactions   FASENRA PEN 30 MG/ML prefilled autoinjector INJECT 30MG  SUBCUTANEOUSLY EVERY 8 WEEKS   gabapentin (NEURONTIN) 300 MG capsule 1 po in am and 12 noon and 2 po at bedtime   glucagon 1 MG injection Inject 1 mg into the muscle once as needed (low blood sugar).   glucose 4 GM chewable tablet Chew 1 tablet by mouth once as needed for low blood sugar.   HUMALOG 100 UNIT/ML injection Inject into the skin.   Insulin Disposable Pump (OMNIPOD 5 G6 INTRO, GEN 5,) KIT CHANGE POD EVERY 2 DAYS   isosorbide mononitrate (IMDUR) 60 MG 24 hr tablet TAKE 1 TABLET BY MOUTH DAILY   levalbuterol (XOPENEX HFA) 45 MCG/ACT inhaler USE 2 INHALATIONS BY MOUTH EVERY 4 TO 6 HOURS AS NEEDED FOR  SHORTNESS OF BREATH , COUGH,  WHEEZE AND TIGHTNESS IN CHEST   levalbuterol (XOPENEX) 0.63 MG/3ML nebulizer solution Take 3  mLs (0.63 mg total) by nebulization every 4 (four) hours as needed for wheezing or shortness of breath (coughing fits).   levocetirizine (XYZAL) 5 MG tablet Take 1 tablet (5 mg total) by mouth every evening.   losartan (COZAAR) 25 MG tablet Take 1 tablet (25 mg total) by mouth daily.   metoprolol tartrate (LOPRESSOR) 50 MG tablet TAKE 1 TABLET BY MOUTH EVERY 12  HOURS PT NEEDS TO KEEP UPCOMING  APPOINTMENT IN MAR TO AVOID ANY  MISSED DOSES   nitroGLYCERIN (NITROSTAT) 0.4 MG SL tablet Place 1 tablet (0.4 mg total) under the tongue every 5 (five) minutes as needed for chest pain.   OZEMPIC, 2 MG/DOSE, 8 MG/3ML SOPN Inject 2 mg into the skin once a week.   pantoprazole (PROTONIX) 40 MG tablet TAKE 1 TABLET BY MOUTH IN THE  MORNING AND AT BEDTIME   Respiratory Therapy Supplies (CARETOUCH 2 CPAP HOSE HANGER) MISC by Does not apply route.   rosuvastatin (CRESTOR) 40 MG tablet TAKE 1 TABLET BY MOUTH AT  BEDTIME   potassium chloride (KLOR-CON) 10 MEQ tablet Take 1 tablet (10 mEq total) by mouth daily.   Current Facility-Administered Medications on File Prior to Visit  Medication   Benralizumab SOSY 30 mg     Allergies  Allergen Reactions   Contrast Media [Iodinated Contrast Media] Shortness Of Breath and Other (See Comments)    sob, chest tight 2024, small amount in joint injection tolerated well   Morphine Itching   Ioversol    Oxycodone Other (See Comments)   Tramadol Itching and Nausea Only    Review Of Systems:  Constitutional:   No  weight loss, night sweats,  Fevers, chills, fatigue, or  lassitude.  HEENT:   No headaches,  Difficulty swallowing,  Tooth/dental problems, or  Sore throat,  No sneezing, itching, ear ache, nasal congestion, post nasal drip,   CV:  No chest pain,  Orthopnea, PND, swelling in lower extremities, anasarca, dizziness, palpitations, syncope.   GI  No heartburn, indigestion, abdominal pain, nausea, vomiting, diarrhea, change in bowel habits, loss  of appetite, bloody stools.   Resp: + shortness of breath with exertion less at rest.  + excess mucus, + productive cough,  No non-productive cough,  No coughing up of blood.  + change in color of mucus.  No wheezing.  No chest wall deformity  Skin: no rash or lesions.  GU: no dysuria, change in color of urine, no urgency or frequency.  No flank pain, no hematuria   MS:  No joint pain or swelling.  No decreased range of motion.  No back pain.  Psych:  No change in mood or affect. No depression or anxiety.  No memory loss.   Vital Signs BP 120/76 (BP Location: Left Arm, Patient Position: Sitting, Cuff Size: Large)   Pulse 76   Ht 5\' 5"  (1.651 m)   Wt 255 lb (115.7 kg)   SpO2 96%   BMI 42.43 kg/m    Physical Exam:  General- No distress,  A&Ox3, pleasant ENT: No sinus tenderness, TM clear, pale nasal mucosa, no oral exudate,no post nasal drip, no LAN Cardiac: S1, S2, regular rate and rhythm, no murmur Chest: + faint  wheeze/No  rales/ dullness; no accessory muscle use, no nasal flaring, no sternal retractions Abd.: Soft Non-tender, ND, BS +, Body mass index is 42.43 kg/m.  Ext: No clubbing cyanosis, edema Neuro:  normal strength, MAE x 4, A&O x 3 Skin: No rashes, warm and dry, No obvious lesions  Psych: normal mood and behavior   Assessment/Plan  Asthma Flare  Seasonal allergies.  Plan We will send in a prescription for a z pack Take 2 tablets ( 500 mg total) Day 1 Take 1 tablet ( 250 mg)  Day 2 through 5. Take probiotic with antibiotic. ( Culturelle, vaginal health) We will send in a prescription for prednisone taper. Prednisone taper; 10 mg tablets: 4 tabs x 2 days, 3 tabs x 2 days, 2 tabs x 2 days 1 tab x 2 days then stop.  Continue Fasenra per allergist Continue triple therapy inhaler regimen Continue Singulair Continue albuterol as needed. Follow up if not better in 2 weeks.  Follow up with allergy as is scheduled 3/19. Please contact office for sooner follow up  if symptoms do not improve or worsen or seek emergency care     I spent 25 minutes dedicated to the care of this patient on the date of this encounter to include pre-visit review of records, face-to-face time with the patient discussing conditions above, post visit ordering of testing, clinical documentation with the electronic health record, making appropriate referrals as documented, and communicating necessary information to the patient's healthcare team.   Bevelyn Ngo, NP 05/12/2023  2:14 PM

## 2023-05-12 NOTE — Patient Instructions (Addendum)
 It is good to see you today. We will send in a prescription for a z pack Take 2 tablets ( 500 mg total) Day 1 Take 1 tablet ( 250 mg)  Day 2 through 5. Take probiotic with antibiotic. ( Culturelle, vaginal health) We will send in a prescription for prednisone taper. Prednisone taper; 10 mg tablets: 4 tabs x 2 days, 3 tabs x 2 days, 2 tabs x 2 days 1 tab x 2 days then stop.  Continue Fasenra per allergist Continue triple therapy inhaler regimen Continue albuterol as needed. Follow up if not better in 2 weeks.  Please contact office for sooner follow up if symptoms do not improve or worsen or seek emergency care

## 2023-05-13 ENCOUNTER — Ambulatory Visit (INDEPENDENT_AMBULATORY_CARE_PROVIDER_SITE_OTHER): Admitting: Allergy

## 2023-05-13 DIAGNOSIS — J3089 Other allergic rhinitis: Secondary | ICD-10-CM

## 2023-05-13 NOTE — Patient Instructions (Signed)
 Today's skin testing *.  Results given.  Environmental allergies Start environmental control measures as below. Use over the counter antihistamines such as Zyrtec (cetirizine), Claritin (loratadine), Allegra (fexofenadine), or Xyzal (levocetirizine) daily as needed. May take twice a day during allergy flares. May switch antihistamines every few months. Consider allergy injections for long term control if above medications do not help the symptoms - handout given.  Use Nasacort (triamcinolone) nasal spray 1 spray per nostril twice a day as needed for nasal congestion.  Use azelastine nasal spray 1-2 sprays per nostril twice a day as needed for runny nose/drainage. Nasal saline spray (i.e., Simply Saline) or nasal saline lavage (i.e., NeilMed) is recommended as needed and prior to medicated nasal sprays. Use refresh eye drops as needed.  Asthma Daily controller medication(s): Breztri 2 puffs twice a day with spacer and rinse mouth afterwards. Fasenra injections every 8 weeks. Continue Singulair (montelukast) 10mg  daily at night. During respiratory infections/flares:  Start Pulmicort (budesonide) 0.5mg  nebulizer twice a day for 1-2 weeks until your breathing symptoms return to baseline.  Pretreat with levoalbuterol 2 puffs or levoalbuterol nebulizer.  If you need to use your levoalbuterol nebulizer machine back to back within 15-30 minutes with no relief then please go to the ER/urgent care for further evaluation.  May use levoalbuterol rescue inhaler 2 puffs or nebulizer every 4 to 6 hours as needed for shortness of breath, chest tightness, coughing, and wheezing. May use levoalbuterol rescue inhaler 2 puffs 5 to 15 minutes prior to strenuous physical activities. Monitor frequency of use - if you need to use it more than twice per week on a consistent basis let us know.  Breathing control goals:  Full participation in all desired activities (may need albuterol before activity) Albuterol use two  times or less a week on average (not counting use with activity) Cough interfering with sleep two times or less a month Oral steroids no more than once a year No hospitalizations   Nose Bleeds: Nosebleeds are very common.  Site of the bleeding is typically on the septum or at the very front of the nose.  Some of the more common causes are from trauma, inflammation or medication induced. Pinch both nostrils while leaning forward for at least 5 minutes before checking to see if the bleeding has stopped. If bleeding is not controlled within 5-10 minutes apply a cotton ball soaked with oxymetazoline (Afrin) to the bleeding nostril for a few seconds.  Preventative treatment: Apply saline nasal gel in each nostril twice a day for 2 weeks to allow the nasal mucosa to heal Consider using a humidifier in the winter Try to keep your blood pressure as normal as possible (120/80)  Reflux Continue lifestyle and dietary modifications. Continue Protonix 40mg  twice day - nothing to eat or drink for 20-30 minutes afterwards.  Recommend GI evaluation.    No follow-ups on file. Or sooner if needed.

## 2023-05-14 ENCOUNTER — Encounter: Payer: Self-pay | Admitting: Family

## 2023-05-14 ENCOUNTER — Encounter: Payer: Self-pay | Admitting: Physical Medicine and Rehabilitation

## 2023-05-18 ENCOUNTER — Ambulatory Visit: Payer: 59 | Attending: Cardiology | Admitting: Cardiology

## 2023-05-18 ENCOUNTER — Encounter: Payer: Self-pay | Admitting: Cardiology

## 2023-05-18 VITALS — BP 138/68 | HR 90 | Ht 65.0 in | Wt 258.0 lb

## 2023-05-18 DIAGNOSIS — I25118 Atherosclerotic heart disease of native coronary artery with other forms of angina pectoris: Secondary | ICD-10-CM | POA: Diagnosis not present

## 2023-05-18 DIAGNOSIS — I739 Peripheral vascular disease, unspecified: Secondary | ICD-10-CM

## 2023-05-18 DIAGNOSIS — E785 Hyperlipidemia, unspecified: Secondary | ICD-10-CM | POA: Diagnosis not present

## 2023-05-18 DIAGNOSIS — R0609 Other forms of dyspnea: Secondary | ICD-10-CM

## 2023-05-18 DIAGNOSIS — R011 Cardiac murmur, unspecified: Secondary | ICD-10-CM | POA: Diagnosis not present

## 2023-05-18 DIAGNOSIS — I10 Essential (primary) hypertension: Secondary | ICD-10-CM | POA: Diagnosis not present

## 2023-05-18 DIAGNOSIS — I5032 Chronic diastolic (congestive) heart failure: Secondary | ICD-10-CM | POA: Diagnosis not present

## 2023-05-18 DIAGNOSIS — E1169 Type 2 diabetes mellitus with other specified complication: Secondary | ICD-10-CM | POA: Diagnosis not present

## 2023-05-18 DIAGNOSIS — M47816 Spondylosis without myelopathy or radiculopathy, lumbar region: Secondary | ICD-10-CM | POA: Diagnosis not present

## 2023-05-18 DIAGNOSIS — J42 Unspecified chronic bronchitis: Secondary | ICD-10-CM | POA: Diagnosis not present

## 2023-05-18 NOTE — Progress Notes (Signed)
 Cardiology Office Note:  .   Date:  05/18/2023  ID:  Katrina Vega, DOB [birth year], MRN [id number] PCP: Zola Button, Grayling Congress, DO  [facility] Providers Cardiologist:  Bryan Lemma, MD     Chief Complaint  Patient presents with   Follow-up    Occasional shortness of breath and palpitations.    Patient Profile: .     Katrina Vega is an obese 68 y.o. female with a PMH notable for CAD (PCI LAD and RI in 2008, RCA and 2013) PAD (right posterior tibial DES, bilateral SFA stents), DM-2, HTN, HLD and HFpEF who presents here for 79-month follow-up at the request of Donato Schultz, *.    Katrina Vega was last seen on 11/06/2022 by Carolan Clines, MD-discussed sciatica pain.  LDL was 54 Crestor.  Recently on Crestor Plavix.  Lisinopril was converted to losartan Jardiance continued along with Bumex  Subjective  Discussed the use of AI scribe software for clinical note transcription with the patient, who gave verbal consent to proceed.  History of Present Illness   Katrina Vega is a 68 year old female with asthma and sciatica who presents with shortness of breath and leg pain.  She experiences ongoing shortness of breath and hoarseness. Recently, she completed a course of antibiotics and prednisone for wheezing related to her asthma, but continues to have hoarseness and shortness of breath, especially when talking. She describes a single incident of feeling like she was choking at night. No chest tightness, pressure, or pain, and no shortness of breath when lying flat or waking up at night.  She has a history of leg stents and is currently on Plavix. She reports leg pain, particularly when walking, and is scheduled for a venous Doppler study. Her blood pressure is usually low, around 120, but was elevated during the visit, which she attributes to consuming salty foods.  She experiences significant pain from sciatica, primarily on the left side, radiating down her leg and settling  behind the knee. The pain is most intense under her buttocks and glutes when sitting, often disrupting her sleep. She manages the pain with Tylenol and has started stretching exercises and therapy.  No swelling in her legs, heart racing, skipping, or flipping beats. No shortness of breath when lying flat or waking up short of breath, except for one incident where she felt like she was choking.      Cardiovascular ROS:  More limited by none CV issues.  Is noting some claudication and some exertional dyspnea related to deconditioning.  ROS:  Review of Systems - Negative except symptoms noted above    Objective   Medications: CV: Plavix 75 mg daily for PAD; Imdur 60 mg daily-antianginal; losartan 25 mg daily and Lopressor 50 mg twice daily; Crestor 40 mg daily Bumex 1 mg daily and as needed potential weight gain DM: Ozempic (8 mg / 3 mL)-inject 20 mg weekly.  Humalog insulin 100 units daily, glucagon PRN, empagliflozin 10 mg daily Pulmonary: Breztri, Pulmicort, see; Xopenex  Studies Reviewed: Marland Kitchen        ECHO [facility])12/16/2021: Mild LVH.  Normal wall motion.  EF 55 to 60%.  GR 1 DD.  Mild LA dilation.  Normal valves. Last ABI (09/2020): Right mid and distal SFA occluded, dampened flow in the popliteal artery via collaterals. Moderate velocity increase at the right profunda femoral  artery.  No hemodynamically significant stenoses are identified in the left lower extremity arterial system.  This  exam reveals normal perfusion of the right lower extremity (ABI 0.97) with monophasic waveform in the PT  This exam reveals mildly decreased perfusion of the left lower extremity, noted at the post tibial artery level (ABI 0.90) with monophasic waveform  in the left AT.   Risk Assessment/Calculations:              Physical Exam:   VS:  BP 138/68   Pulse 90   Ht 5\' 5"  (1.651 m)   Wt 258 lb (117 kg)   SpO2 93%   BMI 42.93 kg/m    Wt Readings from Last 3 Encounters:   05/18/23 258 lb (117 kg)  05/12/23 255 lb (115.7 kg)  05/04/23 256 lb (116.1 kg)    GEN: Well nourished, well groomed in no acute distress; morbidly obese (mesomorphic) NECK: No JVD; No carotid bruits CARDIAC: Distant S1, S2; RRR, no murmurs, rubs, gallops RESPIRATORY:  Clear to auscultation without rales, wheezing or rhonchi ; nonlabored, good air movement. ABDOMEN: Soft, non-tender, non-distended EXTREMITIES:  No edema; No deformity      ASSESSMENT AND PLAN: .    Problem List Items Addressed This Visit       Cardiology Problems   Chronic diastolic congestive heart failure (HCC) (Chronic)   Echocardiogram is not very consistent with CHF.  Just some mild left atrial dilation.  The main treatment here is volume and mild diuresis.  Continue ARB and beta-blocker at current doses (50 mg twice daily Lopressor losartan).  Continue Imdur 60 mg daily as well Bumex 1 mg daily with additional doses PRN.      Coronary artery disease of native artery of native heart with stable angina pectoris (HCC) - Primary (Chronic)   Distant history of PCI for non-STEMI.  No active angina. Is having some exertional dyspnea. Plan: -Continue combination of losartan 25 mg daily and Lopressor 50 mg twice daily. Continue Plavix for combination of CAD and PAD  -As of now, okay to hold Plavix 5 to 7 days preop for surgeries or procedures. -Continue Crestor 40 mg daily-most recent LDL at goal. -Continue Imdur for additional anginal benefit along with Ozempic and Jardiance.      Hyperlipidemia due to type 2 diabetes mellitus (HCC) (Chronic)   Most recent lipid panel was pretty well-controlled as of July 2024 with an LDL of 54, TC of 108 and TG of 121. -Continue Crestor along with combination of Jardiance, Ozempic and insulin      PAD (peripheral artery disease) (HCC): R Post Tib DES, Bilat SFA stents (Chronic)   Now noted some claudication symptoms. Due for follow-up to evaluate leg stents and potential  blockages. - Order leg Dopplers to assess status of leg stents and evaluate for blockages.      Relevant Orders   VAS [place] ABI WITH/WO TBI   VAS [place] LOWER EXTREMITY ARTERIAL DUPLEX   Primary hypertension   Borderline high blood pressure attributed to recent dietary choices. Usual blood pressure around 120 mmHg. - Reassess blood pressure in follow-up visit.      Relevant Orders   ECHOCARDIOGRAM COMPLETE     Other   Chronic bronchitis, unspecified chronic bronchitis type (HCC)   Some baseline dyspnea. Ongoing wheezing and hoarseness attributed to asthma. Concern about persistent hoarseness and breathing difficulties. - Reassess asthma control in follow-up visit.  Persistent shortness of breath without chest pain, leg swelling, or orthopnea. Previous echocardiogram normal with mild abnormal relaxation. Plan to reassess cardiac function post-recovery from cold and allergies. -  Order echocardiogram in May to reassess cardiac function. - Consider stress test or CT scan of heart arteries if shortness of breath persists after reassessment.      DOE (dyspnea on exertion)   Persistent shortness of breath without chest pain, leg swelling, or orthopnea. Previous echocardiogram normal with mild abnormal relaxation. Plan to reassess cardiac function post-recovery from cold and allergies. - Order echocardiogram in May to reassess cardiac function. - Consider stress test or CT scan of heart arteries if shortness of breath persists after reassessment.  Multiple medications for asthma.      Lumbar spondylosis (Chronic)   ciatica Significant pain radiating down left side, exacerbated by sitting and at night. Advised to perform stretching exercises and attend therapy. - Continue stretching exercises and therapy for sciatica management.      Systolic murmur   Most likely innocent flow murmur however clearly audible on exam.  With exertional dyspnea, will check 2D echo.       Relevant Orders    ECHOCARDIOGRAM COMPLETE       Follow-Up: Return in about 3 months (around 08/18/2023).  Time Spent Directly with Patient:   I have spent a total of  43 minutes with the patient reviewing hospital notes, telemetry, EKGs, labs and examining the patient as well as establishing an assessment and plan that was discussed personally with the patient.  > 50% of time was spent in direct patient care. reviewing labs (3 minutes), reviewing studies (7 minutes), reviewing outside studies (3 minutes), face to face time discussing treatment options (22 minutes), 8 minutes, and documenting in the encounter.      Signed, Marykay Lex, MD, MS Bryan Lemma, M.D., M.S. Interventional Cardiologist  [facility]  Pager # [phone] Phone # [phone] [address] [place], [place] [address]

## 2023-05-18 NOTE — Assessment & Plan Note (Signed)
 Most likely innocent flow murmur however clearly audible on exam.  With exertional dyspnea, will check 2D echo.

## 2023-05-18 NOTE — Assessment & Plan Note (Signed)
 Persistent shortness of breath without chest pain, leg swelling, or orthopnea. Previous echocardiogram normal with mild abnormal relaxation. Plan to reassess cardiac function post-recovery from cold and allergies. - Order echocardiogram in May to reassess cardiac function. - Consider stress test or CT scan of heart arteries if shortness of breath persists after reassessment.  Multiple medications for asthma.

## 2023-05-18 NOTE — Assessment & Plan Note (Signed)
 Some baseline dyspnea. Ongoing wheezing and hoarseness attributed to asthma. Concern about persistent hoarseness and breathing difficulties. - Reassess asthma control in follow-up visit.  Persistent shortness of breath without chest pain, leg swelling, or orthopnea. Previous echocardiogram normal with mild abnormal relaxation. Plan to reassess cardiac function post-recovery from cold and allergies. - Order echocardiogram in May to reassess cardiac function. - Consider stress test or CT scan of heart arteries if shortness of breath persists after reassessment.

## 2023-05-18 NOTE — Assessment & Plan Note (Signed)
 Echocardiogram is not very consistent with CHF.  Just some mild left atrial dilation.  The main treatment here is volume and mild diuresis.  Continue ARB and beta-blocker at current doses (50 mg twice daily Lopressor losartan).  Continue Imdur 60 mg daily as well Bumex 1 mg daily with additional doses PRN.

## 2023-05-18 NOTE — Assessment & Plan Note (Signed)
 Borderline high blood pressure attributed to recent dietary choices. Usual blood pressure around 120 mmHg. - Reassess blood pressure in follow-up visit.

## 2023-05-18 NOTE — Assessment & Plan Note (Signed)
 Now noted some claudication symptoms. Due for follow-up to evaluate leg stents and potential blockages. - Order leg Dopplers to assess status of leg stents and evaluate for blockages.

## 2023-05-18 NOTE — Patient Instructions (Signed)
 Medication Instructions:  No changes  *If you need a refill on your cardiac medications before your next appointment, please call your pharmacy*   Lab Work: Not needed  If you have labs (blood work) drawn today and your tests are completely normal, you will receive your results only by: MyChart Message (if you have MyChart) OR A paper copy in the mail If you have any lab test that is abnormal or we need to change your treatment, we will call you to review the results.   Testing/Procedures:   Will be schedule For May 2025- [facility] Your physician has requested that you have an echocardiogram. Echocardiography is a painless test that uses sound waves to create images of your heart. It provides your doctor with information about the size and shape of your heart and how well your heart's chambers and valves are working. This procedure takes approximately one hour. There are no restrictions for this procedure. Please do NOT wear cologne, perfume, aftershave, or lotions (deodorant is allowed). Please arrive 15 minutes prior to your appointment time.   Please note: We ask at that you not bring children with you during ultrasound (echo/ vascular) testing. Due to room size and safety concerns, children are not allowed in the ultrasound rooms during exams. Our front office staff cannot provide observation of children in our lobby area while testing is being conducted. An adult accompanying a patient to their appointment will only be allowed in the ultrasound room at the discretion of the ultrasound technician under special circumstances. We apologize for any inconvenience.  Please note: We ask at that you not bring children with you during ultrasound (echo/ vascular) testing. Due to room size and safety concerns, children are not allowed in the ultrasound rooms during exams. Our front office staff cannot provide observation of children in our lobby area while testing is being conducted. An  adult accompanying a patient to their appointment will only be allowed in the ultrasound room at the discretion of the ultrasound technician under special circumstances. We apologize for any inconvenience.     Will be schedule at [address] Your physician has requested that you have an ankle brachial index (ABI). During this test an ultrasound and blood pressure cuff are used to evaluate the arteries that supply the arms and legs with blood. Allow thirty minutes for this exam. There are no restrictions or special instructions. And possible Your physician has requested that you have a lower  extremity arterial duplex. This test is an ultrasound of the arteries in the legs or arms. It looks at arterial blood flow in the legs. Allow one hour for Lower Arterial scans. There are no restrictions or special instructions.  Please note: We ask at that you not bring children with you during ultrasound (echo/ vascular) testing. Due to room size and safety concerns, children are not allowed in the ultrasound rooms during exams. Our front office staff cannot provide observation of children in our lobby area while testing is being conducted. An adult accompanying a patient to their appointment will only be allowed in the ultrasound room at the discretion of the ultrasound technician under special circumstances. We apologize for any inconvenience.  Follow-Up: At [facility], you and your health needs are our priority.  As part of our continuing mission to provide you with exceptional heart care, we have created designated Provider Care Teams.  These Care Teams include your primary Cardiologist (physician) and Advanced Practice Providers (APPs -  Physician Assistants and Nurse Practitioners) who all work together to provide you with the care you need, when you need it.     Your next appointment:   3 month(s)  The format for your next appointment:   Virtual Visit   Provider:   Bryan Lemma,  MD     Other Instructions   s

## 2023-05-18 NOTE — Assessment & Plan Note (Signed)
 Distant history of PCI for non-STEMI.  No active angina. Is having some exertional dyspnea. Plan: -Continue combination of losartan 25 mg daily and Lopressor 50 mg twice daily. Continue Plavix for combination of CAD and PAD  -As of now, okay to hold Plavix 5 to 7 days preop for surgeries or procedures. -Continue Crestor 40 mg daily-most recent LDL at goal. -Continue Imdur for additional anginal benefit along with Ozempic and Jardiance.

## 2023-05-18 NOTE — Assessment & Plan Note (Signed)
 Most recent lipid panel was pretty well-controlled as of July 2024 with an LDL of 54, TC of 108 and TG of 121. -Continue Crestor along with combination of Jardiance, Ozempic and insulin

## 2023-05-18 NOTE — Assessment & Plan Note (Signed)
 ciatica Significant pain radiating down left side, exacerbated by sitting and at night. Advised to perform stretching exercises and attend therapy. - Continue stretching exercises and therapy for sciatica management.

## 2023-05-19 ENCOUNTER — Ambulatory Visit: Payer: Self-pay

## 2023-05-19 ENCOUNTER — Ambulatory Visit (HOSPITAL_BASED_OUTPATIENT_CLINIC_OR_DEPARTMENT_OTHER)
Admission: RE | Admit: 2023-05-19 | Discharge: 2023-05-19 | Disposition: A | Discharge status: Home / Self Care | Source: Ambulatory Visit | Attending: Physician Assistant | Admitting: Physician Assistant

## 2023-05-19 ENCOUNTER — Ambulatory Visit (INDEPENDENT_AMBULATORY_CARE_PROVIDER_SITE_OTHER): Admitting: Physician Assistant

## 2023-05-19 ENCOUNTER — Encounter: Payer: Self-pay | Admitting: Physician Assistant

## 2023-05-19 VITALS — BP 116/72 | HR 81 | Ht 65.0 in | Wt 255.0 lb

## 2023-05-19 DIAGNOSIS — R0602 Shortness of breath: Secondary | ICD-10-CM | POA: Insufficient documentation

## 2023-05-19 DIAGNOSIS — Z96611 Presence of right artificial shoulder joint: Secondary | ICD-10-CM | POA: Diagnosis not present

## 2023-05-19 DIAGNOSIS — R519 Headache, unspecified: Secondary | ICD-10-CM | POA: Insufficient documentation

## 2023-05-19 DIAGNOSIS — R8271 Bacteriuria: Secondary | ICD-10-CM | POA: Diagnosis not present

## 2023-05-19 DIAGNOSIS — I509 Heart failure, unspecified: Secondary | ICD-10-CM | POA: Diagnosis not present

## 2023-05-19 DIAGNOSIS — R42 Dizziness and giddiness: Secondary | ICD-10-CM

## 2023-05-19 DIAGNOSIS — R82998 Other abnormal findings in urine: Secondary | ICD-10-CM | POA: Diagnosis not present

## 2023-05-19 DIAGNOSIS — I5032 Chronic diastolic (congestive) heart failure: Secondary | ICD-10-CM | POA: Insufficient documentation

## 2023-05-19 DIAGNOSIS — J45909 Unspecified asthma, uncomplicated: Secondary | ICD-10-CM | POA: Diagnosis not present

## 2023-05-19 DIAGNOSIS — I6782 Cerebral ischemia: Secondary | ICD-10-CM | POA: Diagnosis not present

## 2023-05-19 LAB — POCT URINALYSIS DIP (MANUAL ENTRY)
Bilirubin, UA: NEGATIVE
Blood, UA: NEGATIVE
Glucose, UA: >=1000 mg/dL — AB
Ketones, POC UA: NEGATIVE mg/dL
Nitrite, UA: NEGATIVE
Spec Grav, UA: <=1.005 — AB (ref 1.010–1.025)
Urobilinogen, UA: 0.2 U/dL
pH, UA: 7.5 (ref 5.0–8.0)

## 2023-05-19 LAB — COMPREHENSIVE METABOLIC PANEL
ALT: 25 U/L (ref 0–35)
AST: 18 U/L (ref 0–37)
Albumin: 4.3 g/dL (ref 3.5–5.2)
Alkaline Phosphatase: 84 U/L (ref 39–117)
BUN: 23 mg/dL (ref 6–23)
CO2: 30 meq/L (ref 19–32)
Calcium: 10 mg/dL (ref 8.4–10.5)
Chloride: 99 meq/L (ref 96–112)
Creatinine, Ser: 1.24 mg/dL — ABNORMAL HIGH (ref 0.40–1.20)
GFR: 44.46 mL/min — ABNORMAL LOW (ref >60.00)
Glucose, Bld: 118 mg/dL — ABNORMAL HIGH (ref 70–99)
Potassium: 3.9 meq/L (ref 3.5–5.1)
Sodium: 136 meq/L (ref 135–145)
Total Bilirubin: 0.4 mg/dL (ref 0.2–1.2)
Total Protein: 7 g/dL (ref 6.0–8.3)

## 2023-05-19 LAB — BRAIN NATRIURETIC PEPTIDE: Pro B Natriuretic peptide (BNP): 16 pg/mL (ref 0.0–100.0)

## 2023-05-19 NOTE — Telephone Encounter (Signed)
  Chief Complaint: dizziness Symptoms: dizziness, headache, occasional nausea Frequency: began last night Pertinent Negatives: Patient denies weakness, numbness, visual changes vomiting Disposition: [] ED /[] Urgent Care (no appt availability in office) / [x] Appointment(In office/virtual)/ []  [facility]/ [] Home Care/ [] Refused Recommended Disposition /[]  Mobile Bus/ []  Follow-up with PCP Additional Notes: Patient calls reporting dizziness that began last night after taking bumex. She reports that she did drink gatorade that usually resolves that issue when it occurs. States that it is improved now, but was still present this morning. Per protocol, patient to be evaluated within 4 hours. First available appointment with PCP is outside of the guideline. Patient scheduled with first available provider in clinic for today at 05/19/23 at 1320. Care advice reviewed, patient verbalized understanding and denies further questions at this time. Alerting PCP for review.    Copied from CRM [id number]. Topic: Clinical - Red Word Triage >> May 19, 2023 10:46 AM Lennart Pall wrote: Red Word that prompted transfer to Nurse Triage: Patient took a water pill 3/24- shortly after using the restroom, she got a headache...she has been frequently using the rest room and is dizzy and did not feel comfortable driving to work today. She still has a headache now and has not taken anything today for it. Reason for Disposition  [1] Dizziness caused by heat exposure, sudden standing, or poor fluid intake AND [2] no improvement after 2 hours of rest and fluids  Answer Assessment - Initial Assessment Questions 1. DESCRIPTION: "Describe your dizziness."     This morning head felt light, was dizzy felt like she may pass out around 0700. Reports dizziness is mild now. 2. LIGHTHEADED: "Do you feel lightheaded?" (e.g., somewhat faint, woozy, weak upon standing)     Faint 3. VERTIGO: "Do you feel like either you or  the room is spinning or tilting?" (i.e. vertigo)     Denies 4. SEVERITY: "How bad is it?"  "Do you feel like you are going to faint?" "Can you stand and walk?"   - MILD: Feels slightly dizzy, but walking normally.   - MODERATE: Feels unsteady when walking, but not falling; interferes with normal activities (e.g., school, work).   - SEVERE: Unable to walk without falling, or requires assistance to walk without falling; feels like passing out now.      Moderate 5. ONSET:  "When did the dizziness begin?"     Yesterday 6. AGGRAVATING FACTORS: "Does anything make it worse?" (e.g., standing, change in head position)     Denies aggravating factors 7. HEART RATE: "Can you tell me your heart rate?" "How many beats in 15 seconds?"  (Note: not all patients can do this)       Unable to check 8. CAUSE: "What do you think is causing the dizziness?"     Possibly water pill that was taken 3/24 in the evening- 9. RECURRENT SYMPTOM: "Have you had dizziness before?" If Yes, ask: "When was the last time?" "What happened that time?"     states this has happened before with bumex. States in the past she has drank gatorade which resolved the dizziness.   10. OTHER SYMPTOMS: "Do you have any other symptoms?" (e.g., fever, chest pain, vomiting, diarrhea, bleeding)       Nausea, headache  Protocols used: Dizziness - Lightheadedness-A-AH

## 2023-05-19 NOTE — Progress Notes (Unsigned)
 Established patient visit   Patient: Katrina Vega   DOB: 1955-11-06   68 y.o. Female  MRN: [id number] Visit Date: 05/19/2023  Today's healthcare provider: Alfredia Ferguson, PA-C   Cc. Dizziness   Subjective     Pt reports yesterday she took a dose of bumex yesterday afternoon due to   And felt dizzy, increased urination, headache, tylenol, drank gatorade  Today,   Medications: Outpatient Medications Prior to Visit  Medication Sig   azithromycin (ZITHROMAX) 250 MG tablet Take 2 tablets ( 500 mg total) Day 1 Take 1 tablet ( 250 mg)  Day 2 through 5   Budeson-Glycopyrrol-Formoterol (BREZTRI AEROSPHERE) 160-9-4.8 MCG/ACT AERO INHALE 2 INHALATIONS BY MOUTH  TWICE DAILY TO PREVENT COUGH OR  WHEEZE. RINSE, GARGLE, AND SPIT  AFTER USE   budesonide (PULMICORT) 0.5 MG/2ML nebulizer solution Take 2 mLs (0.5 mg total) by nebulization in the morning and at bedtime. During respiratory infections for 1-2 weeks at a time.   bumetanide (BUMEX) 1 MG tablet TAKE 1 TABLET BY MOUTH DAILY   Cholecalciferol 50 MCG (2000 UT) TABS Take 2,000 Units by mouth daily.   clopidogrel (PLAVIX) 75 MG tablet TAKE 1 TABLET BY MOUTH DAILY   Continuous Blood Gluc Sensor (DEXCOM G6 SENSOR) MISC USE 1 SENSOR AS NEEDED CHANGE EVERY 10 DAYS   Continuous Blood Gluc Transmit (DEXCOM G6 TRANSMITTER) MISC    empagliflozin (JARDIANCE) 10 MG TABS tablet Take 1 tablet (10 mg total) by mouth daily before breakfast.   EPINEPHrine (EPIPEN 2-PAK) 0.3 mg/0.3 mL IJ SOAJ injection Use as directed for severe allergic reactions   FASENRA PEN 30 MG/ML prefilled autoinjector INJECT 30MG  SUBCUTANEOUSLY EVERY 8 WEEKS   gabapentin (NEURONTIN) 300 MG capsule 1 po in am and 12 noon and 2 po at bedtime   glucagon 1 MG injection Inject 1 mg into the muscle once as needed (low blood sugar).   glucose 4 GM chewable tablet Chew 1 tablet by mouth once as needed for low blood sugar.   HUMALOG 100 UNIT/ML injection Inject into the skin.    Insulin Disposable Pump (OMNIPOD 5 G6 INTRO, GEN 5,) KIT CHANGE POD EVERY 2 DAYS   isosorbide mononitrate (IMDUR) 60 MG 24 hr tablet TAKE 1 TABLET BY MOUTH DAILY   levalbuterol (XOPENEX HFA) 45 MCG/ACT inhaler USE 2 INHALATIONS BY MOUTH EVERY 4 TO 6 HOURS AS NEEDED FOR  SHORTNESS OF BREATH , COUGH,  WHEEZE AND TIGHTNESS IN CHEST   levalbuterol (XOPENEX) 0.63 MG/3ML nebulizer solution Take 3 mLs (0.63 mg total) by nebulization every 4 (four) hours as needed for wheezing or shortness of breath (coughing fits).   levocetirizine (XYZAL) 5 MG tablet Take 1 tablet (5 mg total) by mouth every evening.   losartan (COZAAR) 25 MG tablet Take 1 tablet (25 mg total) by mouth daily.   metoprolol tartrate (LOPRESSOR) 50 MG tablet TAKE 1 TABLET BY MOUTH EVERY 12  HOURS PT NEEDS TO KEEP UPCOMING  APPOINTMENT IN MAR TO AVOID ANY  MISSED DOSES   nitroGLYCERIN (NITROSTAT) 0.4 MG SL tablet Place 1 tablet (0.4 mg total) under the tongue every 5 (five) minutes as needed for chest pain.   OZEMPIC, 2 MG/DOSE, 8 MG/3ML SOPN Inject 2 mg into the skin once a week.   pantoprazole (PROTONIX) 40 MG tablet TAKE 1 TABLET BY MOUTH IN THE  MORNING AND AT BEDTIME   potassium chloride (KLOR-CON) 10 MEQ tablet Take 1 tablet (10 mEq total) by mouth daily.  predniSONE (DELTASONE) 10 MG tablet Prednisone taper; 10 mg tablets: 4 tabs x 2 days, 3 tabs x 2 days, 2 tabs x 2 days 1 tab x 2 days then stop.   Respiratory Therapy Supplies (CARETOUCH 2 CPAP HOSE HANGER) MISC by Does not apply route.   rosuvastatin (CRESTOR) 40 MG tablet TAKE 1 TABLET BY MOUTH AT  BEDTIME   Facility-Administered Medications Prior to Visit  Medication Dose Route Frequency Provider   Benralizumab SOSY 30 mg  30 mg Subcutaneous Q8 Lynnae January, FNP    Review of Systems {Insert previous labs (optional):23779} {See past labs  Heme  Chem  Endocrine  Serology  Results Review (optional):1}   Objective    There were no vitals taken for this  visit. {Insert last BP/Wt (optional):23777}{See vitals history (optional):1}  Physical Exam Constitutional:      General: She is awake.     Appearance: She is well-developed.  HENT:     Head: Normocephalic.  Eyes:     Extraocular Movements: Extraocular movements intact.     Conjunctiva/sclera: Conjunctivae normal.     Pupils: Pupils are equal, round, and reactive to light.  Cardiovascular:     Rate and Rhythm: Normal rate and regular rhythm.     Heart sounds: Normal heart sounds.  Pulmonary:     Effort: Pulmonary effort is normal.     Breath sounds: Normal breath sounds.  Skin:    General: Skin is warm.  Neurological:     General: No focal deficit present.     Mental Status: She is alert and oriented to person, place, and time. Mental status is at baseline.     Cranial Nerves: No cranial nerve deficit.     Motor: No weakness.     Gait: Gait normal.  Psychiatric:        Attention and Perception: Attention normal.        Mood and Affect: Mood normal.        Speech: Speech normal.        Behavior: Behavior is cooperative.     ***  No results found for any visits on 05/19/23.  Assessment & Plan    There are no diagnoses linked to this encounter.  ***  No follow-ups on file.       Alfredia Ferguson, PA-C  [facility] at [facility] [phone] (phone) [phone] (fax)  [facility]

## 2023-05-19 NOTE — Telephone Encounter (Signed)
 Pt has visit with Lillia Abed today

## 2023-05-20 ENCOUNTER — Encounter: Payer: Self-pay | Admitting: Physical Therapy

## 2023-05-20 ENCOUNTER — Encounter: Payer: Self-pay | Admitting: Family

## 2023-05-20 ENCOUNTER — Other Ambulatory Visit: Payer: Self-pay

## 2023-05-20 ENCOUNTER — Encounter: Payer: Self-pay | Admitting: Physician Assistant

## 2023-05-20 ENCOUNTER — Ambulatory Visit: Admitting: Physical Therapy

## 2023-05-20 DIAGNOSIS — M6281 Muscle weakness (generalized): Secondary | ICD-10-CM

## 2023-05-20 DIAGNOSIS — R252 Cramp and spasm: Secondary | ICD-10-CM | POA: Diagnosis not present

## 2023-05-20 DIAGNOSIS — M5442 Lumbago with sciatica, left side: Secondary | ICD-10-CM | POA: Diagnosis not present

## 2023-05-20 DIAGNOSIS — G8929 Other chronic pain: Secondary | ICD-10-CM

## 2023-05-20 LAB — URINE CULTURE
MICRO NUMBER:: 16244733
SPECIMEN QUALITY:: ADEQUATE
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
## 2023-05-22 ENCOUNTER — Ambulatory Visit: Admitting: Physical Medicine and Rehabilitation

## 2023-05-22 ENCOUNTER — Encounter: Payer: Self-pay | Admitting: Physical Medicine and Rehabilitation

## 2023-05-22 DIAGNOSIS — G8929 Other chronic pain: Secondary | ICD-10-CM | POA: Diagnosis not present

## 2023-05-22 DIAGNOSIS — M545 Low back pain, unspecified: Secondary | ICD-10-CM | POA: Diagnosis not present

## 2023-05-22 DIAGNOSIS — M47816 Spondylosis without myelopathy or radiculopathy, lumbar region: Secondary | ICD-10-CM | POA: Diagnosis not present

## 2023-05-22 DIAGNOSIS — M47819 Spondylosis without myelopathy or radiculopathy, site unspecified: Secondary | ICD-10-CM | POA: Diagnosis not present
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
## 2023-05-25 ENCOUNTER — Encounter: Payer: Self-pay | Admitting: Physical Therapy

## 2023-05-25 ENCOUNTER — Ambulatory Visit: Admitting: Physical Therapy

## 2023-05-25 ENCOUNTER — Encounter: Payer: Self-pay | Admitting: Cardiology

## 2023-05-25 ENCOUNTER — Other Ambulatory Visit: Payer: Self-pay | Admitting: *Deleted

## 2023-05-25 DIAGNOSIS — G8929 Other chronic pain: Secondary | ICD-10-CM | POA: Diagnosis not present

## 2023-05-25 DIAGNOSIS — M6281 Muscle weakness (generalized): Secondary | ICD-10-CM

## 2023-05-25 DIAGNOSIS — R252 Cramp and spasm: Secondary | ICD-10-CM | POA: Diagnosis not present

## 2023-05-25 DIAGNOSIS — I25118 Atherosclerotic heart disease of native coronary artery with other forms of angina pectoris: Secondary | ICD-10-CM

## 2023-05-25 DIAGNOSIS — M5442 Lumbago with sciatica, left side: Secondary | ICD-10-CM | POA: Diagnosis not present

## 2023-05-25 MED ORDER — NITROGLYCERIN 0.4 MG SL SUBL: 0.4000 mg | SUBLINGUAL_TABLET | SUBLINGUAL | 3 refills | Status: AC | PRN

## 2023-05-25 NOTE — Progress Notes (Signed)
 Called patient and medication refilled and made patient aware to call office for any other questions.
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
## 2023-05-26 ENCOUNTER — Encounter: Payer: Self-pay | Admitting: Family

## 2023-05-27 ENCOUNTER — Ambulatory Visit (INDEPENDENT_AMBULATORY_CARE_PROVIDER_SITE_OTHER): Payer: Self-pay | Admitting: Allergy

## 2023-05-27 ENCOUNTER — Encounter: Payer: Self-pay | Admitting: Allergy

## 2023-05-27 DIAGNOSIS — J3089 Other allergic rhinitis: Secondary | ICD-10-CM

## 2023-05-27 DIAGNOSIS — J4489 Other specified chronic obstructive pulmonary disease: Secondary | ICD-10-CM

## 2023-05-27 DIAGNOSIS — K219 Gastro-esophageal reflux disease without esophagitis: Secondary | ICD-10-CM

## 2023-05-27 NOTE — Patient Instructions (Addendum)
 Today's skin testing negative to indoor/outdoor allergens but the positive control was negative questioning the validity of the results.  Get bloodwork We are ordering labs, so please allow 1-2 weeks for the results to come back. With the newly implemented Cures Act, the labs might be visible to you at the same time that they become visible to me. However, I will not address the results until all of the results are back, so please be patient.  In the meantime, continue recommendations in your patient instructions, including avoidance measures (if applicable), until you hear from me.  Use over the counter antihistamines such as Zyrtec (cetirizine), Claritin (loratadine), Allegra (fexofenadine), or Xyzal (levocetirizine) daily as needed. May take twice a day during allergy flares. May switch antihistamines every few months. Use Nasacort (triamcinolone) nasal spray 1 spray per nostril twice a day as needed for nasal congestion.  Use azelastine nasal spray 1-2 sprays per nostril twice a day as needed for runny nose/drainage. Nasal saline spray (i.e., Simply Saline) or nasal saline lavage (i.e., NeilMed) is recommended as needed and prior to medicated nasal sprays. Use refresh eye drops as needed.  Asthma Daily controller medication(s): Breztri 2 puffs twice a day with spacer and rinse mouth afterwards. Fasenra injections every 8 weeks. Continue Singulair (montelukast) 10mg  daily at night. During respiratory infections/flares:  Start Pulmicort (budesonide) 0.5mg  nebulizer twice a day for 1-2 weeks until your breathing symptoms return to baseline.  Pretreat with levoalbuterol 2 puffs or levoalbuterol nebulizer.  If you need to use your levoalbuterol nebulizer machine back to back within 15-30 minutes with no relief then please go to the ER/urgent care for further evaluation.  May use levoalbuterol rescue inhaler 2 puffs or nebulizer every 4 to 6 hours as needed for shortness of breath, chest tightness,  coughing, and wheezing. May use levoalbuterol rescue inhaler 2 puffs 5 to 15 minutes prior to strenuous physical activities. Monitor frequency of use - if you need to use it more than twice per week on a consistent basis let us know.  Breathing control goals:  Full participation in all desired activities (may need albuterol before activity) Albuterol use two times or less a week on average (not counting use with activity) Cough interfering with sleep two times or less a month Oral steroids no more than once a year No hospitalizations   Nose Bleeds: Nosebleeds are very common.  Site of the bleeding is typically on the septum or at the very front of the nose.  Some of the more common causes are from trauma, inflammation or medication induced. Pinch both nostrils while leaning forward for at least 5 minutes before checking to see if the bleeding has stopped. If bleeding is not controlled within 5-10 minutes apply a cotton ball soaked with oxymetazoline (Afrin) to the bleeding nostril for a few seconds.  Preventative treatment: Apply saline nasal gel in each nostril twice a day for 2 weeks to allow the nasal mucosa to heal Consider using a humidifier in the winter Try to keep your blood pressure as normal as possible (120/80)  Reflux Continue lifestyle and dietary modifications. Continue Protonix 40mg  twice day - nothing to eat or drink for 20-30 minutes afterwards.  Recommend GI evaluation.    Return in about 4 months (around 09/26/2023). Or sooner if needed.

## 2023-05-27 NOTE — Progress Notes (Signed)
 Skin testing note  RE: Katrina Vega MRN: [id number] DOB: [birth year] Date of Office Visit: 05/27/2023  Referring provider: Zola Button, Grayling Congress, * Primary care provider: Zola Button, Grayling Congress, DO  Chief Complaint: skin testing  History of Present Illness: I had the pleasure of seeing Katrina Vega for a skin testing visit at the [facility] on 05/27/2023. She is a 68 y.o. female, who is being followed for asthma/COPD on Fasenra, allergic rhinitis, GERD. Her previous allergy office visit was on 03/09/2023 with Dr. Selena Batten. Today is a skin testing visit.   Assessment and Plan: Katrina Vega is a 68 y.o. female with: Other allergic rhinitis Past history - started on AIT in [place] in 2021 [facility]). Stopped AIT due to persistent coughing. Today's skin testing negative to indoor/outdoor allergens but the positive control was negative x 2 questioning the validity of the results.  Patient initially states that she didn't take any antihistamines but then thinks she may took levocetirizine.  Get bloodwork Use over the counter antihistamines such as Zyrtec (cetirizine), Claritin (loratadine), Allegra (fexofenadine), or Xyzal (levocetirizine) daily as needed. May take twice a day during allergy flares. May switch antihistamines every few months. Use Nasacort (triamcinolone) nasal spray 1 spray per nostril twice a day as needed for nasal congestion.  Use azelastine nasal spray 1-2 sprays per nostril twice a day as needed for runny nose/drainage. Nasal saline spray (i.e., Simply Saline) or nasal saline lavage (i.e., NeilMed) is recommended as needed and prior to medicated nasal sprays. Use refresh eye drops as needed.   Asthma-COPD overlap syndrome Past history - triggered by strong scents at work. Albuterol caused shaking/palpitations.  Daily controller medication(s): Breztri 2 puffs twice a day with spacer and rinse mouth afterwards. Fasenra injections every 8 weeks. Continue  Singulair (montelukast) 10mg  daily at night. During respiratory infections/flares:  Start Pulmicort (budesonide) 0.5mg  nebulizer twice a day for 1-2 weeks until your breathing symptoms return to baseline.  Pretreat with levoalbuterol 2 puffs or levoalbuterol nebulizer.  If you need to use your levoalbuterol nebulizer machine back to back within 15-30 minutes with no relief then please go to the ER/urgent care for further evaluation.  May use levoalbuterol rescue inhaler 2 puffs or nebulizer every 4 to 6 hours as needed for shortness of breath, chest tightness, coughing, and wheezing. May use levoalbuterol rescue inhaler 2 puffs 5 to 15 minutes prior to strenuous physical activities. Monitor frequency of use - if you need to use it more than twice per week on a consistent basis let us know.    Gastroesophageal reflux disease, unspecified whether esophagitis present Continue lifestyle and dietary modifications. Continue Protonix 40mg  twice day - nothing to eat or drink for 20-30 minutes afterwards.  Recommend GI evaluation.    Return in about 4 months (around 09/26/2023).  No orders of the defined types were placed in this encounter.  Lab Orders         Allergens w/Total IgE Area 2      Diagnostics: Skin Testing: Environmental allergy panel. Today's skin testing negative to indoor/outdoor allergens but the positive control x 2 was negative questioning the validity of the results.  Results discussed with patient/family.  Airborne Adult Perc - 05/27/23 1400     Time Antigen Placed 1409    Allergen Manufacturer Waynette Buttery    Location Back    Number of Test 55    Panel 1 Select    1. Control-Buffer 50% Glycerol Negative  2. Control-Histamine Negative    3. Bahia Negative    4. French Southern Territ[place] Negative    5. Johnson Negative    6. Kentucky Blue Negative    7. Meadow Fescue Negative    8. Perennial Rye Negative    9. Timothy Negative    10. Ragweed Mix Negative    11. Cocklebur Negative    12.  Plantain,  English Negative    13. Baccharis Negative    14. Dog Fennel Negative    15. Russian Thistle Negative    16. Lamb's Quarters Negative    17. Sheep Sorrell Negative    18. Rough Pigweed Negative    19. Marsh Elder, Rough Negative    20. Mugwort, Common Negative    21. Box, Elder Negative    22. Cedar, red Negative    23. Sweet Gum Negative    24. Pecan Pollen Negative    25. Pine Mix Negative    26. Walnut, Black Pollen Negative    27. Red Mulberry Negative    28. Ash Mix Negative    29. Birch Mix Negative    30. Beech American Negative    31. Cottonwood, Guinea[place]iss[place] Negative    32. Hickory, White Negative    33. Maple Mix Negative    34. Oak, [place] Mix Negative    35. Sycamore Eastern Negative    36. Alternaria Alternata Negative    37. Cladosporium Herbarum Negative    38. Aspergillus Mix Negative    39. Penicillium Mix Negative    40. Bipolaris Sorokiniana (Helminthosporium) Negative    41. Drechslera Spicifera (Curvularia) Negative    42. Mucor Plumbeus Negative    43. Fusarium Moniliforme Negative    44. Aureobasidium Pullulans (pullulara) Negative    45. Rhizopus Oryzae Negative    46. Botrytis Cinera Negative    47. Epicoccum Nigrum Negative    48. Phoma Betae Negative    49. Dust Mite Mix Negative    50. Cat Hair 10,000 BAU/ml Negative    51.  Dog Epithelia Negative    52. Mixed Feathers Negative    53. Horse Epithelia Negative    54. Cockroach, German Negative    55. Tobacco Leaf Negative             Previous notes and tests were reviewed. The plan was reviewed with the patient/family, and all questions/concerned were addressed.  It was my pleasure to see Katrina Vega today and participate in her care. Please feel free to contact me with any questions or concerns.  Sincerely,  Wyline Mood, DO Allergy & Immunology  [facility] office: [phone] [facility] office: [phone]

## 2023-05-28 ENCOUNTER — Encounter: Payer: Self-pay | Admitting: Family

## 2023-06-01 ENCOUNTER — Ambulatory Visit: Admitting: Orthopedic Surgery

## 2023-06-01 ENCOUNTER — Telehealth: Payer: Self-pay | Admitting: Family Medicine
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
## 2023-06-01 NOTE — Telephone Encounter (Signed)
 Copied from CRM [id number]. Topic: Medicare AWV >> Jun 01, 2023  3:15 PM Payton Doughty wrote: Reason for CRM: Called LVM 06/01/2023 to schedule AWV. Please schedule Virtual or Telehealth visits ONLY.   Verlee Rossetti; Care Guide Ambulatory Clinical Support [facility] Direct Dial: [phone]

## 2023-06-02 ENCOUNTER — Ambulatory Visit: Attending: Family Medicine | Admitting: Physical Therapy

## 2023-06-02 ENCOUNTER — Encounter: Payer: Self-pay | Admitting: Physical Therapy

## 2023-06-02 DIAGNOSIS — M5442 Lumbago with sciatica, left side: Secondary | ICD-10-CM | POA: Diagnosis not present

## 2023-06-02 DIAGNOSIS — R252 Cramp and spasm: Secondary | ICD-10-CM | POA: Insufficient documentation

## 2023-06-02 DIAGNOSIS — M6281 Muscle weakness (generalized): Secondary | ICD-10-CM | POA: Insufficient documentation

## 2023-06-02 DIAGNOSIS — G8929 Other chronic pain: Secondary | ICD-10-CM | POA: Insufficient documentation

## 2023-06-03 ENCOUNTER — Ambulatory Visit: Payer: Self-pay

## 2023-06-03 DIAGNOSIS — R252 Cramp and spasm: Secondary | ICD-10-CM

## 2023-06-03 DIAGNOSIS — M6281 Muscle weakness (generalized): Secondary | ICD-10-CM

## 2023-06-03 DIAGNOSIS — M5442 Lumbago with sciatica, left side: Secondary | ICD-10-CM | POA: Diagnosis not present

## 2023-06-03 DIAGNOSIS — G8929 Other chronic pain: Secondary | ICD-10-CM | POA: Diagnosis not present
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
## 2023-06-04 ENCOUNTER — Encounter: Payer: Self-pay | Admitting: Allergy

## 2023-06-04 LAB — ALLERGENS W/TOTAL IGE AREA 2

## 2023-06-08 ENCOUNTER — Ambulatory Visit: Admitting: Physical Therapy

## 2023-06-10 ENCOUNTER — Encounter

## 2023-06-15 ENCOUNTER — Ambulatory Visit: Admitting: Physical Therapy

## 2023-06-18 ENCOUNTER — Ambulatory Visit (HOSPITAL_COMMUNITY)
Admission: RE | Admit: 2023-06-18 | Discharge: 2023-06-18 | Disposition: A | Discharge status: Home / Self Care | Source: Ambulatory Visit | Attending: Cardiology | Admitting: Cardiology

## 2023-06-18 ENCOUNTER — Encounter: Payer: Self-pay | Admitting: Family

## 2023-06-18 ENCOUNTER — Encounter: Payer: Self-pay | Admitting: Cardiology

## 2023-06-18 ENCOUNTER — Ambulatory Visit

## 2023-06-18 DIAGNOSIS — I739 Peripheral vascular disease, unspecified: Secondary | ICD-10-CM | POA: Insufficient documentation

## 2023-06-18 LAB — VAS US ABI WITH/WO TBI
Left ABI: 0.87
Right ABI: 0.98

## 2023-06-19 ENCOUNTER — Other Ambulatory Visit

## 2023-06-19 ENCOUNTER — Other Ambulatory Visit: Payer: Self-pay | Admitting: *Deleted

## 2023-06-19 ENCOUNTER — Encounter: Payer: Self-pay | Admitting: Cardiology

## 2023-06-19 DIAGNOSIS — I25118 Atherosclerotic heart disease of native coronary artery with other forms of angina pectoris: Secondary | ICD-10-CM

## 2023-06-19 DIAGNOSIS — I739 Peripheral vascular disease, unspecified: Secondary | ICD-10-CM

## 2023-06-22 ENCOUNTER — Other Ambulatory Visit: Payer: Self-pay | Admitting: Family

## 2023-06-22 ENCOUNTER — Ambulatory Visit (INDEPENDENT_AMBULATORY_CARE_PROVIDER_SITE_OTHER): Admitting: "Endocrinology

## 2023-06-22 ENCOUNTER — Encounter: Payer: Self-pay | Admitting: "Endocrinology

## 2023-06-22 ENCOUNTER — Encounter: Payer: Self-pay | Admitting: Family

## 2023-06-22 VITALS — BP 110/80 | HR 77 | Ht 65.0 in

## 2023-06-22 DIAGNOSIS — E1165 Type 2 diabetes mellitus with hyperglycemia: Secondary | ICD-10-CM

## 2023-06-22 DIAGNOSIS — Z794 Long term (current) use of insulin: Secondary | ICD-10-CM | POA: Diagnosis not present

## 2023-06-22 DIAGNOSIS — E1122 Type 2 diabetes mellitus with diabetic chronic kidney disease: Secondary | ICD-10-CM

## 2023-06-22 DIAGNOSIS — E7800 Pure hypercholesterolemia, unspecified: Secondary | ICD-10-CM | POA: Diagnosis not present

## 2023-06-22 DIAGNOSIS — Z9641 Presence of insulin pump (external) (internal): Secondary | ICD-10-CM

## 2023-06-22 DIAGNOSIS — N1832 Chronic kidney disease, stage 3b: Secondary | ICD-10-CM | POA: Diagnosis not present

## 2023-06-22 DIAGNOSIS — Z7985 Long-term (current) use of injectable non-insulin antidiabetic drugs: Secondary | ICD-10-CM | POA: Diagnosis not present

## 2023-06-22 LAB — POCT GLYCOSYLATED HEMOGLOBIN (HGB A1C): Hemoglobin A1C: 8.2 % — AB (ref 4.0–5.6)

## 2023-06-22 MED ORDER — BAQSIMI ONE PACK 3 MG/DOSE NA POWD
1.0000 | NASAL | 3 refills | Status: AC | PRN
Start: 2023-06-22 — End: ?

## 2023-06-22 MED ORDER — TIRZEPATIDE 5 MG/0.5ML ~~LOC~~ SOAJ: 5.0000 mg | SUBCUTANEOUS | 2 refills | Status: DC

## 2023-06-22 MED ORDER — OMNIPOD 5 DEXG7G6 PODS GEN 5 MISC: 1.0000 | 3 refills | Status: DC

## 2023-06-22 MED ORDER — DEXCOM G6 SENSOR MISC: 1.0000 | 3 refills | Status: AC

## 2023-06-22 NOTE — Progress Notes (Signed)
 Outpatient Endocrinology Note Jorge Newcomer, MD  06/22/23   Katrina Vega [birth year] [id number]  Referring Provider: Crecencio Dodge, Candida Chalk, * Primary Care Provider: Crecencio Dodge, Candida Chalk, DO Reason for consultation: Subjective   Assessment & Plan  Diagnoses and all orders for this visit:  Uncontrolled type 2 diabetes mellitus with hyperglycemia (HCC) -     POCT glycosylated hemoglobin (Hb A1C) -     Glucagon (BAQSIMI ONE PACK) 3 MG/DOSE POWD; Place 1 Device into the nose as needed (Low blood sugar with impaired consciousness). -     tirzepatide (MOUNJARO) 5 MG/0.5ML Pen; Inject 5 mg into the skin once a week. -     Continuous Glucose Sensor (DEXCOM G6 SENSOR) MISC; 1 Device by Does not apply route continuous. -     Insulin  Disposable Pump (OMNIPOD 5 DEXG7G6 PODS GEN 5) MISC; 1 Device by Does not apply route every other day.  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, +  neuropathy, +  nephropathy,  Lab Results  Component Value Date   GFR 44.46 (L) 05/19/2023   Hba1c goal less than 7, current Hba1c is  Lab Results  Component Value Date   HGBA1C 8.2 (A) 06/22/2023   Will recommend the following: Stop Ozempic 2mg /week (lost 20 lbs initially, now gaining back), start mounjaro 5 mg weekly to help with further weight loss  Omnipod 5 with DexCom G6 with Humalog U100  Basal: 2.2u/hr I:C 1:4 ISF 1:25 IOB 3 hrs Target 110  Jardiance  10 mg every day - stopped taking it due to yeast infections   No history of MEN syndrome/medullary thyroid  cancer/pancreatitis or pancreatic cancer in self or family No known contraindications/side effects to any of above medications Glucagon discussed and prescribed with refills on 06/22/23   -Last LD and Tg are as follows: Lab Results  Component Value Date   LDLCALC 54 09/04/2022    Lab Results  Component Value  Date   TRIG 121.0 09/04/2022   -On rosuvastatin  40 mg QD -Follow low fat diet and exercise   -Blood pressure goal <140/90 - Microalbumin/creatinine goal is < 30 -Last MA/Cr is as follows: Lab Results  Component Value Date   MICROALBUR 22.1 (H) 03/24/2022   -on ACE/ARB -lisinopril  20 mg every day  -diet changes including salt restriction -limit eating outside -counseled BP targets per standards of diabetes care -uncontrolled blood pressure can lead to retinopathy, nephropathy and cardiovascular and atherosclerotic heart disease  Reviewed and counseled on: -A1C target -Blood sugar targets -Complications of uncontrolled diabetes  -Checking blood sugar before meals and bedtime and bring log next visit -All medications with mechanism of action and side effects -Hypoglycemia management: rule of 15's, Glucagon Emergency Kit and medical alert ID -low-carb low-fat plate-method diet -At least 20 minutes of physical activity per day -Annual dilated retinal eye exam and foot exam -compliance and follow up needs -follow up as scheduled or earlier if problem gets worse  Call if blood sugar is less than 70 or consistently above 250    Take a 15 gm snack of carbohydrate at bedtime before you go to sleep if your blood sugar is less than 100.    If you are going to fast after midnight for a test or procedure, ask your physician for instructions on how to reduce/decrease your insulin  dose.    Call if blood sugar is less than  70 or consistently above 250  -Treating a low sugar by rule of 15  (15 gms of sugar every 15 min until sugar is more than 70) If you feel your sugar is low, test your sugar to be sure If your sugar is low (less than 70), then take 15 grams of a fast acting Carbohydrate (3-4 glucose tablets or glucose gel or 4 ounces of juice or regular soda) Recheck your sugar 15 min after treating low to make sure it is more than 70 If sugar is still less than 70, treat again with 15  grams of carbohydrate          Don't drive the hour of hypoglycemia  If unconscious/unable to eat or drink by mouth, use glucagon injection or nasal spray baqsimi and call 911. Can repeat again in 15 min if still unconscious.  Return in about 4 weeks (around 07/20/2023).   I have reviewed current medications, nurse's notes, allergies, vital signs, past medical and surgical history, family medical history, and social history for this encounter. Counseled patient on symptoms, examination findings, lab findings, imaging results, treatment decisions and monitoring and prognosis. The patient understood the recommendations and agrees with the treatment plan. All questions regarding treatment plan were fully answered.  Jorge Newcomer, MD  06/22/23    History of Present Illness Katrina Vega is a 68 y.o. year old female who presents for evaluation of Type II diabetes mellitus.  ZUMA DORKO was first diagnosed around 2010.   Diabetes education +  Home diabetes regimen: Jardiance  10 mg every day - stopped taking it due to yeast infections  Ozempic 2mg /week Omnipod 5 with DexCom G6 with Humalog U100  Basal: 2.2u/hr I:C 1:4 ISF 1:25 IOB 3 hrs Target 110  COMPLICATIONS +  MI/Stroke -  retinopathy +  neuropathy +  nephropathy  SYMPTOMS REVIEWED - Polyuria - Weight loss - Blurred vision  BLOOD SUGAR DATA Cannot download CGM today due to lack of clarity app setup    Physical Exam  BP 110/80   Pulse 77   Ht 5\' 5"  (1.651 m)   SpO2 99%   BMI 42.43 kg/m    Constitutional: well developed, well nourished Head: normocephalic, atraumatic Eyes: sclera anicteric, no redness Neck: supple Lungs: normal respiratory effort Neurology: alert and oriented Skin: dry, no appreciable rashes Musculoskeletal: no appreciable defects Psychiatric: normal mood and affect Diabetic Foot Exam - Simple   No data filed      Current Medications Patient's Medications  New Prescriptions    CONTINUOUS GLUCOSE SENSOR (DEXCOM G6 SENSOR) MISC    1 Device by Does not apply route continuous.   GLUCAGON (BAQSIMI ONE PACK) 3 MG/DOSE POWD    Place 1 Device into the nose as needed (Low blood sugar with impaired consciousness).   INSULIN  DISPOSABLE PUMP (OMNIPOD 5 DEXG7G6 PODS GEN 5) MISC    1 Device by Does not apply route every other day.   TIRZEPATIDE (MOUNJARO) 5 MG/0.5ML PEN    Inject 5 mg into the skin once a week.  Previous Medications   BUDESON-GLYCOPYRROL-FORMOTEROL  (BREZTRI  AEROSPHERE) 160-9-4.8 MCG/ACT AERO    INHALE 2 INHALATIONS BY MOUTH  TWICE DAILY TO PREVENT COUGH OR  WHEEZE. RINSE, GARGLE, AND SPIT  AFTER USE   BUDESONIDE  (PULMICORT ) 0.5 MG/2ML NEBULIZER SOLUTION    Take 2 mLs (0.5 mg total) by nebulization in the morning and at bedtime. During respiratory infections for 1-2 weeks at a time.   BUMETANIDE  (BUMEX ) 1 MG TABLET  TAKE 1 TABLET BY MOUTH DAILY   CHOLECALCIFEROL 50 MCG (2000 UT) TABS    Take 2,000 Units by mouth daily.   CLOPIDOGREL  (PLAVIX ) 75 MG TABLET    TAKE 1 TABLET BY MOUTH DAILY   CONTINUOUS BLOOD GLUC SENSOR (DEXCOM G6 SENSOR) MISC    USE 1 SENSOR AS NEEDED CHANGE EVERY 10 DAYS   CONTINUOUS BLOOD GLUC TRANSMIT (DEXCOM G6 TRANSMITTER) MISC       EMPAGLIFLOZIN  (JARDIANCE ) 10 MG TABS TABLET    Take 1 tablet (10 mg total) by mouth daily before breakfast.   EPINEPHRINE  (EPIPEN  2-PAK) 0.3 MG/0.3 ML IJ SOAJ INJECTION    Use as directed for severe allergic reactions   FASENRA  PEN 30 MG/ML PREFILLED AUTOINJECTOR    INJECT 30MG  SUBCUTANEOUSLY EVERY 8 WEEKS   GABAPENTIN  (NEURONTIN ) 300 MG CAPSULE    1 po in am and 12 noon and 2 po at bedtime   GLUCOSE 4 GM CHEWABLE TABLET    Chew 1 tablet by mouth once as needed for low blood sugar.   HUMALOG 100 UNIT/ML INJECTION    Inject into the skin.   ISOSORBIDE  MONONITRATE (IMDUR ) 60 MG 24 HR TABLET    TAKE 1 TABLET BY MOUTH DAILY   LEVALBUTEROL  (XOPENEX  HFA) 45 MCG/ACT INHALER    USE 2 INHALATIONS BY MOUTH EVERY 4 TO 6 HOURS AS  NEEDED FOR  SHORTNESS OF BREATH , COUGH,  WHEEZE AND TIGHTNESS IN CHEST   LEVALBUTEROL  (XOPENEX ) 0.63 MG/3ML NEBULIZER SOLUTION    Take 3 mLs (0.63 mg total) by nebulization every 4 (four) hours as needed for wheezing or shortness of breath (coughing fits).   LEVOCETIRIZINE (XYZAL ) 5 MG TABLET    Take 1 tablet (5 mg total) by mouth every evening.   LOSARTAN  (COZAAR ) 25 MG TABLET    Take 1 tablet (25 mg total) by mouth daily.   METOPROLOL  TARTRATE (LOPRESSOR ) 50 MG TABLET    TAKE 1 TABLET BY MOUTH EVERY 12  HOURS PT NEEDS TO KEEP UPCOMING  APPOINTMENT IN MAR TO AVOID ANY  MISSED DOSES   NITROGLYCERIN  (NITROSTAT ) 0.4 MG SL TABLET    Place 1 tablet (0.4 mg total) under the tongue every 5 (five) minutes as needed for chest pain.   PANTOPRAZOLE  (PROTONIX ) 40 MG TABLET    TAKE 1 TABLET BY MOUTH IN THE  MORNING AND AT BEDTIME   POTASSIUM CHLORIDE  (KLOR-CON ) 10 MEQ TABLET    Take 1 tablet (10 mEq total) by mouth daily.   RESPIRATORY THERAPY SUPPLIES (CARETOUCH 2 CPAP HOSE HANGER) MISC    by Does not apply route.   ROSUVASTATIN  (CRESTOR ) 40 MG TABLET    TAKE 1 TABLET BY MOUTH AT  BEDTIME  Modified Medications   No medications on file  Discontinued Medications   GLUCAGON 1 MG INJECTION    Inject 1 mg into the muscle once as needed (low blood sugar).   INSULIN  DISPOSABLE PUMP (OMNIPOD 5 G6 INTRO, GEN 5,) KIT    CHANGE POD EVERY 2 DAYS   OZEMPIC, 2 MG/DOSE, 8 MG/3ML SOPN    Inject 2 mg into the skin once a week.    Allergies Allergies  Allergen Reactions   Contrast Media [Iodinated Contrast Media] Shortness Of Breath and Other (See Comments)    sob, chest tight 2024, small amount in joint injection tolerated well   Morphine Itching   Ioversol    Oxycodone  Other (See Comments)   Tramadol  Itching and Nausea Only    Past Medical History Past  Medical History:  Diagnosis Date   Asthma    CAD S/P two-vessel DES PCI 2008   LAD and RI; 2013 PTCA of small RCA.   CHF (congestive heart failure), NYHA  class I, chronic, diastolic (HCC) 2019   Recent Echo 12/16/2021: Mild concentric LVH.  EF 59%.  GI 1 DD.  Normal LAP-(Mildly dilated LA;; has converted from to torsemide  and now on bumetanide    CKD stage 3 due to type 2 diabetes mellitus (HCC)    COPD (chronic obstructive pulmonary disease) (HCC) 2021   Complicated by asthma and seasonal allergies.   Diabetes mellitus, type II, insulin  dependent (HCC) 2010   On insulin  60 units TID Premeal.  Also on Jardiance  and Ozempic.   Eczema    Essential hypertension    Heart attack [facility]) 2008   2008-two-vessel PCI; 2013 PTCA only small RCA   History of left hip replacement 04/2018   Hyperlipidemia associated with type 2 diabetes mellitus (HCC)    140 mg rosuvastatin    Insulin  pump in place    PAD (peripheral artery disease) (HCC)    Status post bilateral SFA stents and right posterior tibial DES stent   Primary hypertension 01/20/2011      Patient's blood pressure is well controlled.  Continue current medications.    Past Surgical History Past Surgical History:  Procedure Laterality Date   AAA DUPLEX  10/09/2020   Max Aorta (sac) diameter 2.51 cm prox with mild ectasia in Prox Aorta. Diffuse plaque in mid-distal Aorta. Bilateral ICA poorly visulailzed - appear to be Severely stenosed with difuse plaque. - Consider CTA of cath directed Angio.   APPENDECTOMY  1974   BIOPSY  02/22/2021   Procedure: BIOPSY;  Surgeon: Alvis Jourdain, MD;  Location: [facility];  Service: Endoscopy;;   CARPAL TUNNEL RELEASE  2017   CARPAL TUNNEL RELEASE Right 01/01/2023   Procedure: RIGHT CARPAL TUNNEL RELEASE REVISION;  Surgeon: Merrill Abide, MD;  Location: [facility];  Service: Orthopedics;  Laterality: Right;   COLONOSCOPY WITH PROPOFOL  N/A 02/22/2021   Procedure: COLONOSCOPY WITH PROPOFOL ;  Surgeon: Alvis Jourdain, MD;  Location: [facility];  Service: Endoscopy;  Laterality: N/A;   CORONARY BALLOON ANGIOPLASTY  2013   PTCA of RCA followed by  PCI   CORONARY STENT INTERVENTION  2008   Text DES PCI to LAD and RI/OM1; also prox RCA   ESOPHAGOGASTRODUODENOSCOPY (EGD) WITH PROPOFOL  N/A 02/22/2021   Procedure: ESOPHAGOGASTRODUODENOSCOPY (EGD) WITH PROPOFOL ;  Surgeon: Alvis Jourdain, MD;  Location: [facility];  Service: Endoscopy;  Laterality: N/A;   LEA Dopplers  10/09/2020   R mid & Distal SFA -CTO - dampend flow in R Pop via collaterals - Moderate velocity increase in R PFA. No signficant stenosis in LLE.   R ABI 0.97) - normal. L ABI - 0.91 w/ monophasic flow in L AT -> c/w 11/2019- R SFA new.   LEFT HEART CATH AND CORONARY ANGIOGRAPHY  12/22/2017   Mild LM plaque.  Mild proximal LAD plaque.  High D1-40% ostial.  Patent mid LAD DES (Taxus from 2008), large distal LAD free disease; Prox LCx-OM1 stents patent (Taxus 2008). Small RCA - patent prox stent(~50-60% ISR), PTCA site from 2013 - patent   LOWER EXTREMITY ANGIOGRAM Bilateral 12/22/2017   Bilateral SFA stents with evidence of severe right and mid left ISR bilateral popliteal arteries proximal trifurcation vessels are patent.  Moderate to severe lesion involving mid R AntTib, poor distal flow in L Ant Tib - 2 V runoff Bilat. -->  referred for R SFA Laser Atherectomy & DCB 5 x 120 mm.   LOWER EXTREMITY INTERVENTION Right 12/22/2017   Laser atherectomy of right SFA followed by [facility] with 5.0 x 120 mm impact.-For severe right SFA ISR   LUMBAR SPINE SURGERY  2010   NM MYOVIEW  LTD  07/07/2019   [facility]): Lexiscan.  Nondiagnostic EKG.  Dyspnea with effusion.  No ischemia or infarction.  Soft tissue attenuation noted.Aaron Aas  LVEF 72%.  No RWMA.  LOW RISK.--No change from 11/2017   POLYPECTOMY  02/22/2021   Procedure: POLYPECTOMY;  Surgeon: Alvis Jourdain, MD;  Location: [facility];  Service: Endoscopy;;   REPLACEMENT TOTAL KNEE  2017 and 2018   TONSILLECTOMY     TOTAL ABDOMINAL HYSTERECTOMY  1989   TOTAL HIP ARTHROPLASTY  04/2018   TOTAL SHOULDER ARTHROPLASTY  11/2019    TRANSTHORACIC ECHOCARDIOGRAM  07/04/2019   Normal LV size, mild LVH, hyperdynamic LVEF at >65% with grade 1 diastolic dysfunction.  Otherwise no other significant abnormality.  Poor quality due to patient body habitus.   TRANSTHORACIC ECHOCARDIOGRAM  12/16/2021   [facility]) normal LV size and function.  Moderate concentric LVH.  Normal WM.  EF estimated 59%.  GR 1 DD.  Mild LA dilation.  No valvular lesions.    Family History family history includes Breast cancer in her mother; Eczema in her grandson; Heart attack in her father; Heart disease in her mother; Lung cancer in her father.  Social History Social History   Socioeconomic History   Marital status: Single    Spouse name: Not on file   Number of children: 5   Years of education: Not on file   Highest education level: Associate degree: academic program  Occupational History   Not on file  Tobacco Use   Smoking status: Former    Current packs/day: 0.00    Average packs/day: 0.5 packs/day for 15.0 years (7.5 ttl pk-yrs)    Types: Cigarettes    Start date: 10/13/1980    Quit date: 10/14/1995    Years since quitting: 27.7    Passive exposure: Current   Smokeless tobacco: Never  Vaping Use   Vaping status: Never Used  Substance and Sexual Activity   Alcohol use: Not Currently   Drug use: Not Currently   Sexual activity: Yes  Other Topics Concern   Not on file  Social History Narrative   Not on file   Social Drivers of Health   Financial Resource Strain: Low Risk  (02/09/2023)   Overall Financial Resource Strain (CARDIA)    Difficulty of Paying Living Expenses: Not hard at all  Food Insecurity: No Food Insecurity (02/09/2023)   Hunger Vital Sign    Worried About Running Out of Food in the Last Year: Never true    Ran Out of Food in the Last Year: Never true  Transportation Needs: No Transportation Needs (02/09/2023)   PRAPARE - Administrator, Civil Service (Medical): No     Lack of Transportation (Non-Medical): No  Physical Activity: Inactive (02/09/2023)   Exercise Vital Sign    Days of Exercise per Week: 0 days    Minutes of Exercise per Session: 10 min  Stress: No Stress Concern Present (02/09/2023)   [employer] of Occupational Health - Occupational Stress Questionnaire    Feeling of Stress : Not at all  Social Connections: Moderately Integrated (02/09/2023)   Social Connection and Isolation Panel [NHANES]    Frequency of Communication with Friends and  Family: More than three times a week    Frequency of Social Gatherings with Friends and Family: Once a week    Attends Religious Services: More than 4 times per year    Active Member of Clubs or Organizations: Yes    Attends Banker Meetings: More than 4 times per year    Marital Status: Never married  Intimate Partner Violence: Not At Risk (07/16/2021)   Humiliation, Afraid, Rape, and Kick questionnaire    Fear of Current or Ex-Partner: No    Emotionally Abused: No    Physically Abused: No    Sexually Abused: No    Lab Results  Component Value Date   HGBA1C 8.2 (A) 06/22/2023   HGBA1C 7.5 11/04/2022   HGBA1C 7.7 (H) 12/20/2021   Lab Results  Component Value Date   CHOL 108 09/04/2022   Lab Results  Component Value Date   HDL 29.50 (L) 09/04/2022   Lab Results  Component Value Date   LDLCALC 54 09/04/2022   Lab Results  Component Value Date   TRIG 121.0 09/04/2022   Lab Results  Component Value Date   CHOLHDL 4 09/04/2022   Lab Results  Component Value Date   CREATININE 1.24 (H) 05/19/2023   Lab Results  Component Value Date   GFR 44.46 (L) 05/19/2023   Lab Results  Component Value Date   MICROALBUR 22.1 (H) 03/24/2022      Component Value Date/Time   NA 136 05/19/2023 1359   K 3.9 05/19/2023 1359   CL 99 05/19/2023 1359   CO2 30 05/19/2023 1359   GLUCOSE 118 (H) 05/19/2023 1359   BUN 23 05/19/2023 1359   CREATININE 1.24 (H) 05/19/2023 1359    CREATININE 1.02 (H) 05/04/2023 1432   CALCIUM  10.0 05/19/2023 1359   PROT 7.0 05/19/2023 1359   ALBUMIN  4.3 05/19/2023 1359   AST 18 05/19/2023 1359   AST 19 05/04/2023 1432   ALT 25 05/19/2023 1359   ALT 22 05/04/2023 1432   ALKPHOS 84 05/19/2023 1359   BILITOT 0.4 05/19/2023 1359   BILITOT 0.4 05/04/2023 1432   GFRNONAA 60 (L) 05/04/2023 1432      Latest Ref Rng & Units 05/19/2023    1:59 PM 05/04/2023    2:32 PM 02/02/2023    3:14 PM  BMP  Glucose 70 - 99 mg/dL 409  811  914   BUN 6 - 23 mg/dL 23  15  17    Creatinine 0.40 - 1.20 mg/dL 7.82  9.56  2.13   Sodium 135 - 145 mEq/L 136  140  140   Potassium 3.5 - 5.1 mEq/L 3.9  3.8  3.8   Chloride 96 - 112 mEq/L 99  106  106   CO2 19 - 32 mEq/L 30  24  24    Calcium  8.4 - 10.5 mg/dL 08.6  57.8  46.9        Component Value Date/Time   WBC 6.3 05/04/2023 1432   WBC 4.9 09/23/2022 0909   RBC 4.98 05/04/2023 1432   RBC 5.04 05/04/2023 1432   HGB 13.2 05/04/2023 1432   HCT 41.4 05/04/2023 1432   PLT 295 05/04/2023 1432   MCV 83.1 05/04/2023 1432   MCH 26.5 05/04/2023 1432   MCHC 31.9 05/04/2023 1432   RDW 16.6 (H) 05/04/2023 1432   LYMPHSABS 2.4 05/04/2023 1432   MONOABS 0.6 05/04/2023 1432   EOSABS 0.0 05/04/2023 1432   BASOSABS 0.0 05/04/2023 1432     Parts of  this note may have been dictated using voice recognition software. There may be variances in spelling and vocabulary which are unintentional. Not all errors are proofread. Please notify the [facility] if any discrepancies are noted or if the meaning of any statement is not clear.

## 2023-06-22 NOTE — Patient Instructions (Signed)

## 2023-06-23 ENCOUNTER — Encounter: Payer: Self-pay | Admitting: Physical Therapy

## 2023-06-23 ENCOUNTER — Ambulatory Visit: Admitting: Physical Therapy

## 2023-06-23 VITALS — BP 121/60 | HR 95

## 2023-06-23 DIAGNOSIS — M6281 Muscle weakness (generalized): Secondary | ICD-10-CM

## 2023-06-23 DIAGNOSIS — R252 Cramp and spasm: Secondary | ICD-10-CM | POA: Diagnosis not present

## 2023-06-23 DIAGNOSIS — G8929 Other chronic pain: Secondary | ICD-10-CM

## 2023-06-23 DIAGNOSIS — M5442 Lumbago with sciatica, left side: Secondary | ICD-10-CM | POA: Diagnosis not present
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
## 2023-06-23 NOTE — Telephone Encounter (Signed)
 Refill for Singulair  10 mg ( 100 pills per pharmacy) with 1 refill sent to [facility].

## 2023-06-24 ENCOUNTER — Encounter: Payer: Self-pay | Admitting: Cardiovascular Disease

## 2023-06-24 ENCOUNTER — Ambulatory Visit: Attending: Cardiovascular Disease | Admitting: Cardiovascular Disease

## 2023-06-24 ENCOUNTER — Encounter: Payer: Self-pay | Admitting: Family

## 2023-06-24 VITALS — BP 124/72 | HR 85 | Ht 65.0 in | Wt 261.0 lb

## 2023-06-24 DIAGNOSIS — I739 Peripheral vascular disease, unspecified: Secondary | ICD-10-CM | POA: Diagnosis not present

## 2023-06-24 MED ORDER — CILOSTAZOL 50 MG PO TABS: 50.0000 mg | ORAL_TABLET | Freq: Two times a day (BID) | ORAL | 1 refills | Status: DC

## 2023-06-24 NOTE — Assessment & Plan Note (Signed)
 History of PAD status post right posterior tibial DES 10/08 and apparently has had stenting in her left leg as well.  Her ABIs from 3 years ago are stable although her Dopplers suggest moderate disease on the right and a high-grade stenosis in the left popliteal artery.  She does have lifestyle-limiting claudication.  I am going to begin her on Pletal 50 mg p.o. twice daily and will see her back in 3 months.  If she does not have any clinical benefit we will discuss outpatient peripheral angiography and endovascular therapy for lifestyle-limiting claudication.

## 2023-06-24 NOTE — Addendum Note (Signed)
 Addended by: Deforest Fast on: 06/24/2023 02:47 PM   Modules accepted: Orders

## 2023-06-24 NOTE — Patient Instructions (Signed)
 Medication Instructions:  Your physician has recommended you make the following change in your medication:   -Start cilostazol (pletal) 50mg  twice daily.  *If you need a refill on your cardiac medications before your next appointment, please call your pharmacy*  Follow-Up: At [facility], you and your health needs are our priority.  As part of our continuing mission to provide you with exceptional heart care, our providers are all part of one team.  This team includes your primary Cardiologist (physician) and Advanced Practice Providers or APPs (Physician Assistants and Nurse Practitioners) who all work together to provide you with the care you need, when you need it.  Your next appointment:   3 month(s)  Provider:   Lauro Portal, MD    We recommend signing up for the patient portal called "MyChart".  Sign up information is provided on this After Visit Summary.  MyChart is used to connect with patients for Virtual Visits (Telemedicine).  Patients are able to view lab/test results, encounter notes, upcoming appointments, etc.  Non-urgent messages can be sent to your provider as well.   To learn more about what you can do with MyChart, go to [email or web address].

## 2023-06-24 NOTE — Addendum Note (Signed)
 Addended by: Deforest Fast on: 06/24/2023 02:51 PM   Modules accepted: Orders

## 2023-06-24 NOTE — Progress Notes (Signed)
 06/24/2023 Katrina Vega   1955/08/29  [id number]  Primary Physician Estill Hemming, DO Primary Cardiologist: Avanell Leigh MD Lathan Poke, [place], [place]  HPI:  Katrina Vega is a 68 y.o. 68 year old moderately overweight single African-American female mother of 5, grandmother of 9 grandchildren referred by Dr. Addie Holstein for evaluation of PAD.  She is retired from Pharmacist, community at [employer].  Her risk factors include remote tobacco abuse, treated hypertension, diabetes and hyperlipidemia.  She has never had a stroke but did have coronary stenting in [place] in 2008 and apparently had a heart heart attack in 2015.  She had stenting of her right leg in 2008 and I think her left leg in 2019.  She does complain of dyspnea.  Over the last 3 months she has noticed increasing lower extremity claudication left greater than right.  She had Dopplers that revealed stable ABIs compared to 3 years ago with moderate disease in her proximal right SFA and a high-grade lesion in her left popliteal artery.   Current Meds  Medication Sig   Budeson-Glycopyrrol-Formoterol  (BREZTRI  AEROSPHERE) 160-9-4.8 MCG/ACT AERO INHALE 2 INHALATIONS BY MOUTH  TWICE DAILY TO PREVENT COUGH OR  WHEEZE. RINSE, GARGLE, AND SPIT  AFTER USE   budesonide  (PULMICORT ) 0.5 MG/2ML nebulizer solution Take 2 mLs (0.5 mg total) by nebulization in the morning and at bedtime. During respiratory infections for 1-2 weeks at a time.   bumetanide  (BUMEX ) 1 MG tablet TAKE 1 TABLET BY MOUTH DAILY   Cholecalciferol 50 MCG (2000 UT) TABS Take 2,000 Units by mouth daily.   clopidogrel  (PLAVIX ) 75 MG tablet TAKE 1 TABLET BY MOUTH DAILY   Continuous Blood Gluc Sensor (DEXCOM G6 SENSOR) MISC USE 1 SENSOR AS NEEDED CHANGE EVERY 10 DAYS   Continuous Blood Gluc Transmit (DEXCOM G6 TRANSMITTER) MISC    Continuous Glucose Sensor (DEXCOM G6 SENSOR) MISC 1 Device by Does not apply route continuous.   empagliflozin  (JARDIANCE ) 10 MG  TABS tablet Take 1 tablet (10 mg total) by mouth daily before breakfast.   EPINEPHrine  (EPIPEN  2-PAK) 0.3 mg/0.3 mL IJ SOAJ injection Use as directed for severe allergic reactions   FASENRA  PEN 30 MG/ML prefilled autoinjector INJECT 30MG  SUBCUTANEOUSLY EVERY 8 WEEKS   gabapentin  (NEURONTIN ) 300 MG capsule 1 po in am and 12 noon and 2 po at bedtime   Glucagon (BAQSIMI ONE PACK) 3 MG/DOSE POWD Place 1 Device into the nose as needed (Low blood sugar with impaired consciousness).   glucose 4 GM chewable tablet Chew 1 tablet by mouth once as needed for low blood sugar.   HUMALOG 100 UNIT/ML injection Inject into the skin.   Insulin  Disposable Pump (OMNIPOD 5 DEXG7G6 PODS GEN 5) MISC 1 Device by Does not apply route every other day.   isosorbide  mononitrate (IMDUR ) 60 MG 24 hr tablet TAKE 1 TABLET BY MOUTH DAILY   levalbuterol  (XOPENEX  HFA) 45 MCG/ACT inhaler USE 2 INHALATIONS BY MOUTH EVERY 4 TO 6 HOURS AS NEEDED FOR  SHORTNESS OF BREATH , COUGH,  WHEEZE AND TIGHTNESS IN CHEST   levalbuterol  (XOPENEX ) 0.63 MG/3ML nebulizer solution Take 3 mLs (0.63 mg total) by nebulization every 4 (four) hours as needed for wheezing or shortness of breath (coughing fits).   levocetirizine (XYZAL ) 5 MG tablet Take 1 tablet (5 mg total) by mouth every evening.   losartan  (COZAAR ) 25 MG tablet Take 1 tablet (25 mg total) by mouth daily.   metoprolol  tartrate (LOPRESSOR ) 50 MG tablet  TAKE 1 TABLET BY MOUTH EVERY 12  HOURS PT NEEDS TO KEEP UPCOMING  APPOINTMENT IN MAR TO AVOID ANY  MISSED DOSES   montelukast  (SINGULAIR ) 10 MG tablet TAKE 1 TABLET BY MOUTH AT  BEDTIME   nitroGLYCERIN  (NITROSTAT ) 0.4 MG SL tablet Place 1 tablet (0.4 mg total) under the tongue every 5 (five) minutes as needed for chest pain.   pantoprazole  (PROTONIX ) 40 MG tablet TAKE 1 TABLET BY MOUTH IN THE  MORNING AND AT BEDTIME   potassium chloride  (KLOR-CON ) 10 MEQ tablet Take 1 tablet (10 mEq total) by mouth daily.   Respiratory Therapy Supplies  (CARETOUCH 2 CPAP HOSE HANGER) MISC by Does not apply route.   rosuvastatin  (CRESTOR ) 40 MG tablet TAKE 1 TABLET BY MOUTH AT  BEDTIME   tirzepatide (MOUNJARO) 5 MG/0.5ML Pen Inject 5 mg into the skin once a week.   Current Facility-Administered Medications for the 06/24/23 encounter (Office Visit) with Avanell Leigh, MD  Medication   Benralizumab  SOSY 30 mg     Allergies  Allergen Reactions   Contrast Media [Iodinated Contrast Media] Shortness Of Breath and Other (See Comments)    sob, chest tight 2024, small amount in joint injection tolerated well   Morphine Itching   Ioversol    Oxycodone  Other (See Comments)   Tramadol  Itching and Nausea Only    Social History   Socioeconomic History   Marital status: Single    Spouse name: Not on file   Number of children: 5   Years of education: Not on file   Highest education level: Associate degree: academic program  Occupational History   Not on file  Tobacco Use   Smoking status: Former    Current packs/day: 0.00    Average packs/day: 0.5 packs/day for 15.0 years (7.5 ttl pk-yrs)    Types: Cigarettes    Start date: 10/13/1980    Quit date: 10/14/1995    Years since quitting: 27.7    Passive exposure: Current   Smokeless tobacco: Never  Vaping Use   Vaping status: Never Used  Substance and Sexual Activity   Alcohol use: Not Currently   Drug use: Not Currently   Sexual activity: Yes  Other Topics Concern   Not on file  Social History Narrative   Not on file   Social Drivers of Health   Financial Resource Strain: Low Risk  (02/09/2023)   Overall Financial Resource Strain (CARDIA)    Difficulty of Paying Living Expenses: Not hard at all  Food Insecurity: No Food Insecurity (02/09/2023)   Hunger Vital Sign    Worried About Running Out of Food in the Last Year: Never true    Ran Out of Food in the Last Year: Never true  Transportation Needs: No Transportation Needs (02/09/2023)   PRAPARE - Scientist, research (physical sciences) (Medical): No    Lack of Transportation (Non-Medical): No  Physical Activity: Inactive (02/09/2023)   Exercise Vital Sign    Days of Exercise per Week: 0 days    Minutes of Exercise per Session: 10 min  Stress: No Stress Concern Present (02/09/2023)   [employer] of Occupational Health - Occupational Stress Questionnaire    Feeling of Stress : Not at all  Social Connections: Moderately Integrated (02/09/2023)   Social Connection and Isolation Panel [NHANES]    Frequency of Communication with Friends and Family: More than three times a week    Frequency of Social Gatherings with Friends and Family: Once a week  Attends Religious Services: More than 4 times per year    Active Member of Clubs or Organizations: Yes    Attends Banker Meetings: More than 4 times per year    Marital Status: Never married  Intimate Partner Violence: Not At Risk (07/16/2021)   Humiliation, Afraid, Rape, and Kick questionnaire    Fear of Current or Ex-Partner: No    Emotionally Abused: No    Physically Abused: No    Sexually Abused: No     Review of Systems: General: negative for chills, fever, night sweats or weight changes.  Cardiovascular: negative for chest pain, dyspnea on exertion, edema, orthopnea, palpitations, paroxysmal nocturnal dyspnea or shortness of breath Dermatological: negative for rash Respiratory: negative for cough or wheezing Urologic: negative for hematuria Abdominal: negative for nausea, vomiting, diarrhea, bright red blood per rectum, melena, or hematemesis Neurologic: negative for visual changes, syncope, or dizziness All other systems reviewed and are otherwise negative except as noted above.    Blood pressure 124/72, pulse 85, height 5\' 5"  (1.651 m), weight 261 lb (118.4 kg), SpO2 93%.  General appearance: alert and no distress Neck: no adenopathy, no carotid bruit, no JVD, supple, symmetrical, trachea midline, and thyroid  not enlarged,  symmetric, no tenderness/mass/nodules Lungs: clear to auscultation bilaterally Heart: regular rate and rhythm, S1, S2 normal, no murmur, click, rub or gallop Extremities: extremities normal, atraumatic, no cyanosis or edema Pulses: Diminished pedal pulses Skin: Skin color, texture, turgor normal. No rashes or lesions Neurologic: Grossly normal  EKG EKG Interpretation Date/Time:  Wednesday June 24 2023 14:08:18 EDT Ventricular Rate:  84 PR Interval:  178 QRS Duration:  78 QT Interval:  346 QTC Calculation: 408 R Axis:   7  Text Interpretation: Normal sinus rhythm Normal ECG When compared with ECG of 06-Nov-2022 08:40, No significant change was found Confirmed by Lauro Portal [phone]) on 06/24/2023 2:13:16 PM    ASSESSMENT AND PLAN:   PAD (peripheral artery disease) (HCC): R Post Tib DES, Bilat SFA stents History of PAD status post right posterior tibial DES 10/08 and apparently has had stenting in her left leg as well.  Her ABIs from 3 years ago are stable although her Dopplers suggest moderate disease on the right and a high-grade stenosis in the left popliteal artery.  She does have lifestyle-limiting claudication.  I am going to begin her on Pletal 50 mg p.o. twice daily and will see her back in 3 months.  If she does not have any clinical benefit we will discuss outpatient peripheral angiography and endovascular therapy for lifestyle-limiting claudication.     Avanell Leigh MD FACP,FACC,FAHA, [facility] 06/24/2023 2:26 PM

## 2023-06-25 ENCOUNTER — Encounter: Admitting: Physical Therapy

## 2023-06-26 ENCOUNTER — Telehealth: Payer: Self-pay

## 2023-06-26 NOTE — Telephone Encounter (Signed)
 [facility] mail order pharmacy is requesting clarification on both medications cilostazol  and clopidogrel . Does pt suppose to be taking both medications? Please address     Ph# [phone]   Order# [id number]

## 2023-06-26 NOTE — Telephone Encounter (Signed)
 Spoke with Rexene Catching, Pharmacist with [facility] regarding question if cilostazol  and clopidogrel  are ok to take together. Per Dr. Katheryne Pane, ok for pt to take these medications together. Also, clarified that pt wanted supply sent to local pharmacy and prescription request to [facility] would need to be cancelled. Rexene Catching, confirmed that pt will not receive medication from [facility] at this fill but will keep prescription active if pt would like to get future refills from them.

## 2023-06-30 ENCOUNTER — Ambulatory Visit

## 2023-06-30 ENCOUNTER — Ambulatory Visit (HOSPITAL_COMMUNITY): Attending: Cardiology

## 2023-06-30 DIAGNOSIS — I10 Essential (primary) hypertension: Secondary | ICD-10-CM | POA: Insufficient documentation

## 2023-06-30 DIAGNOSIS — R011 Cardiac murmur, unspecified: Secondary | ICD-10-CM | POA: Insufficient documentation

## 2023-06-30 LAB — ECHOCARDIOGRAM COMPLETE
Area-P 1/2: 2.62 cm2
S' Lateral: 2.5 cm

## 2023-07-01 ENCOUNTER — Encounter: Payer: Self-pay | Admitting: "Endocrinology

## 2023-07-01 DIAGNOSIS — E1165 Type 2 diabetes mellitus with hyperglycemia: Secondary | ICD-10-CM

## 2023-07-02 ENCOUNTER — Encounter: Payer: Self-pay | Admitting: Cardiology

## 2023-07-02 MED ORDER — HUMALOG 100 UNIT/ML IJ SOLN: INTRAMUSCULAR | 2 refills | Status: DC

## 2023-07-04 NOTE — Telephone Encounter (Signed)
 Yes.  Should not be an issue with flying.

## 2023-07-06 ENCOUNTER — Encounter: Payer: Self-pay | Admitting: Cardiovascular Disease

## 2023-07-08 ENCOUNTER — Other Ambulatory Visit: Payer: Self-pay

## 2023-07-08 DIAGNOSIS — E1165 Type 2 diabetes mellitus with hyperglycemia: Secondary | ICD-10-CM

## 2023-07-08 MED ORDER — HUMALOG 100 UNIT/ML IJ SOLN: INTRAMUSCULAR | 3 refills | Status: DC

## 2023-07-09 ENCOUNTER — Other Ambulatory Visit: Payer: Self-pay | Admitting: Family

## 2023-07-10 ENCOUNTER — Other Ambulatory Visit (INDEPENDENT_AMBULATORY_CARE_PROVIDER_SITE_OTHER): Payer: Self-pay

## 2023-07-10 ENCOUNTER — Encounter: Payer: Self-pay | Admitting: Family

## 2023-07-10 ENCOUNTER — Ambulatory Visit: Admitting: Orthopaedic Surgery

## 2023-07-10 DIAGNOSIS — M25512 Pain in left shoulder: Secondary | ICD-10-CM

## 2023-07-10 MED ORDER — LIDOCAINE HCL 1 % IJ SOLN
3.0000 mL | INTRAMUSCULAR | Status: AC | PRN
  Administered 2023-07-10: 3 mL

## 2023-07-10 MED ORDER — METHYLPREDNISOLONE ACETATE 40 MG/ML IJ SUSP
40.0000 mg | INTRAMUSCULAR | Status: AC | PRN
Start: 2023-07-10 — End: 2023-07-10
  Administered 2023-07-10: 40 mg via INTRA_ARTICULAR

## 2023-07-10 MED ORDER — BUPIVACAINE HCL 0.5 % IJ SOLN
3.0000 mL | INTRAMUSCULAR | Status: AC | PRN
  Administered 2023-07-10: 3 mL via INTRA_ARTICULAR
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
## 2023-07-12 ENCOUNTER — Other Ambulatory Visit: Payer: Self-pay | Admitting: Cardiology

## 2023-07-14 ENCOUNTER — Ambulatory Visit: Admitting: Orthopaedic Surgery

## 2023-07-29 ENCOUNTER — Encounter: Payer: Self-pay | Admitting: Cardiovascular Disease

## 2023-07-31 ENCOUNTER — Other Ambulatory Visit: Payer: Self-pay | Admitting: Orthopaedic Surgery

## 2023-07-31 ENCOUNTER — Encounter: Payer: Self-pay | Admitting: Orthopaedic Surgery

## 2023-07-31 MED ORDER — METHOCARBAMOL 500 MG PO TABS: 500.0000 mg | ORAL_TABLET | Freq: Four times a day (QID) | ORAL | 2 refills | Status: AC | PRN

## 2023-08-03 ENCOUNTER — Encounter: Payer: Self-pay | Admitting: "Endocrinology

## 2023-08-03 ENCOUNTER — Other Ambulatory Visit: Payer: Self-pay | Admitting: Family Medicine

## 2023-08-03 ENCOUNTER — Ambulatory Visit (INDEPENDENT_AMBULATORY_CARE_PROVIDER_SITE_OTHER): Admitting: "Endocrinology

## 2023-08-03 VITALS — BP 124/70 | HR 81 | Ht 65.0 in | Wt 261.0 lb

## 2023-08-03 DIAGNOSIS — Z9641 Presence of insulin pump (external) (internal): Secondary | ICD-10-CM | POA: Diagnosis not present

## 2023-08-03 DIAGNOSIS — E1165 Type 2 diabetes mellitus with hyperglycemia: Secondary | ICD-10-CM

## 2023-08-03 DIAGNOSIS — Z7985 Long-term (current) use of injectable non-insulin antidiabetic drugs: Secondary | ICD-10-CM | POA: Diagnosis not present

## 2023-08-03 DIAGNOSIS — E7800 Pure hypercholesterolemia, unspecified: Secondary | ICD-10-CM | POA: Diagnosis not present

## 2023-08-03 DIAGNOSIS — M545 Low back pain, unspecified: Secondary | ICD-10-CM

## 2023-08-03 MED ORDER — TIRZEPATIDE 7.5 MG/0.5ML ~~LOC~~ SOAJ: 7.5000 mg | SUBCUTANEOUS | 0 refills | Status: DC

## 2023-08-03 NOTE — Progress Notes (Signed)
 Outpatient Endocrinology Note Jorge Newcomer, MD  08/03/23   Katrina Vega 31-Oct-1955 [id number]  Referring Provider: Crecencio Dodge, Candida Chalk, * Primary Care Provider: Crecencio Dodge, Candida Chalk, DO Reason for consultation: Subjective   Assessment & Plan  Diagnoses and all orders for this visit:  Uncontrolled type 2 diabetes mellitus with hyperglycemia (HCC) -     Ambulatory referral to Podiatry  Insulin  pump in place  Long-term (current) use of injectable non-insulin  antidiabetic drugs  Pure hypercholesterolemia  Other orders -     tirzepatide (MOUNJARO) 7.5 MG/0.5ML Pen; Inject 7.5 mg into the skin once a week.    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, +  neuropathy, +  nephropathy,  Lab Results  Component Value Date   GFR 44.46 (L) 05/19/2023   Hba1c goal less than 7, current Hba1c is  Lab Results  Component Value Date   HGBA1C 8.2 (A) 06/22/2023   Will recommend the following: Stopped Ozempic 2mg /week (lost 20 lbs initially, then started gaining back) Mounjaro 7.5 mg weekly to help with further weight loss  Omnipod 5 with DexCom G6 with Humalog  U100 Patient reports pod dysfunction after it broke, setup new pod today Basal: 2.3u/hr I:C 1:4 ISF 1:25 IOB 3 hrs Target 110, correct above 120  Jardiance  10 mg every day - stopped taking it due to yeast infections   No history of MEN syndrome/medullary thyroid  cancer/pancreatitis or pancreatic cancer in self or family No known contraindications/side effects to any of above medications Glucagon  discussed and prescribed with refills on 06/22/23   -Last LD and Tg are as follows: Lab Results  Component Value Date   LDLCALC 54 09/04/2022    Lab Results  Component Value Date   TRIG 121.0 09/04/2022   -On rosuvastatin  40 mg QD -Follow low fat diet and exercise   -Blood pressure goal <140/90 - Microalbumin/creatinine goal is < 30 -Last MA/Cr is as follows: Lab Results  Component Value Date   MICROALBUR 22.1  (H) 03/24/2022   -on ACE/ARB losartan  25 mg every day (prev. on lisinopril  20 mg every day?) -diet changes including salt restriction -limit eating outside -counseled BP targets per standards of diabetes care -uncontrolled blood pressure can lead to retinopathy, nephropathy and cardiovascular and atherosclerotic heart disease  Reviewed and counseled on: -A1C target -Blood sugar targets -Complications of uncontrolled diabetes  -Checking blood sugar before meals and bedtime and bring log next visit -All medications with mechanism of action and side effects -Hypoglycemia management: rule of 15's, Glucagon  Emergency Kit and medical alert ID -low-carb low-fat plate-method diet -At least 20 minutes of physical activity per day -Annual dilated retinal eye exam and foot exam -compliance and follow up needs -follow up as scheduled or earlier if problem gets worse  Call if blood sugar is less than 70 or consistently above 250    Take a 15 gm snack of carbohydrate at bedtime before you go to sleep if your blood sugar is less than 100.    If you are going to fast after midnight for a test or procedure, ask your physician for instructions on how to reduce/decrease your insulin  dose.    Call if blood sugar is less than 70 or consistently above 250  -Treating a low sugar by rule of 15  (15 gms of sugar every 15 min until sugar is more than 70) If you feel your sugar is low, test your sugar to be sure If your sugar is low (less than 70),  then take 15 grams of a fast acting Carbohydrate (3-4 glucose tablets or glucose gel or 4 ounces of juice or regular soda) Recheck your sugar 15 min after treating low to make sure it is more than 70 If sugar is still less than 70, treat again with 15 grams of carbohydrate          Don't drive the hour of hypoglycemia  If unconscious/unable to eat or drink by mouth, use glucagon  injection or nasal spray baqsimi  and call 911. Can repeat again in 15 min if still  unconscious.  Return in about 2 weeks (around 08/17/2023).   I have reviewed current medications, nurse's notes, allergies, vital signs, past medical and surgical history, family medical history, and social history for this encounter. Counseled patient on symptoms, examination findings, lab findings, imaging results, treatment decisions and monitoring and prognosis. The patient understood the recommendations and agrees with the treatment plan. All questions regarding treatment plan were fully answered.  Jorge Newcomer, MD  08/03/23    History of Present Illness Katrina Vega is a 68 y.o. year old female who presents for follow up on Type II diabetes mellitus.  Katrina Vega was first diagnosed around 2010.   Diabetes education +  Home diabetes regimen: Mounjaro 7.5 mg weekly  Omnipod 5 with DexCom G6 with Humalog  U100  Basal: 2.2u/hr I:C 1:4 ISF 1:25 IOB 3 hrs Target 110, correct above 120  Stopped Ozempic 2mg /week (lost 20 lbs initially, then started gaining back) Stopped Jardiance  10 mg every day due to yeast infections   COMPLICATIONS +  MI/Stroke -  retinopathy +  neuropathy +  nephropathy  SYMPTOMS REVIEWED - Polyuria - Weight loss - Blurred vision  BLOOD SUGAR DATA  CGM interpretation: At today's visit, we reviewed her CGM downloads. The full report is scanned in the media. Reviewing the CGM trends, BG are high across the day.  Physical Exam  BP 124/70   Pulse 81   Ht 5\' 5"  (1.651 m)   Wt 261 lb (118.4 kg)   SpO2 94%   BMI 43.43 kg/m    Constitutional: well developed, well nourished Head: normocephalic, atraumatic Eyes: sclera anicteric, no redness Neck: supple Lungs: normal respiratory effort Neurology: alert and oriented Skin: dry, no appreciable rashes Musculoskeletal: no appreciable defects Psychiatric: normal mood and affect Diabetic Foot Exam - Simple   Simple Foot Form Diabetic Foot exam was performed with the following findings: Yes  08/03/2023  9:44 AM  Visual Inspection No deformities, no ulcerations, no other skin breakdown bilaterally: Yes Sensation Testing Intact to touch and monofilament testing bilaterally: Yes Pulse Check Posterior Tibialis and Dorsalis pulse intact bilaterally: Yes Comments B/L  Monofilament 2/3 R, 3/3 L       Current Medications Patient's Medications  New Prescriptions   TIRZEPATIDE (MOUNJARO) 7.5 MG/0.5ML PEN    Inject 7.5 mg into the skin once a week.  Previous Medications   BREZTRI  AEROSPHERE 160-9-4.8 MCG/ACT AERO INHALER    INHALE 2 INHALATIONS BY MOUTH  TWICE DAILY TO PREVENT COUGH OR  WHEEZE. RINSE, GARGLE, AND SPIT  AFTER USE   BUDESONIDE  (PULMICORT ) 0.5 MG/2ML NEBULIZER SOLUTION    Take 2 mLs (0.5 mg total) by nebulization in the morning and at bedtime. During respiratory infections for 1-2 weeks at a time.   BUMETANIDE  (BUMEX ) 1 MG TABLET    TAKE 1 TABLET BY MOUTH DAILY   CHOLECALCIFEROL 50 MCG (2000 UT) TABS    Take 2,000 Units by mouth daily.  CILOSTAZOL  (PLETAL ) 50 MG TABLET    Take 1 tablet (50 mg total) by mouth 2 (two) times daily.   CLOPIDOGREL  (PLAVIX ) 75 MG TABLET    TAKE 1 TABLET BY MOUTH DAILY   CONTINUOUS BLOOD GLUC SENSOR (DEXCOM G6 SENSOR) MISC    USE 1 SENSOR AS NEEDED CHANGE EVERY 10 DAYS   CONTINUOUS BLOOD GLUC TRANSMIT (DEXCOM G6 TRANSMITTER) MISC       CONTINUOUS GLUCOSE SENSOR (DEXCOM G6 SENSOR) MISC    1 Device by Does not apply route continuous.   EMPAGLIFLOZIN  (JARDIANCE ) 10 MG TABS TABLET    Take 1 tablet (10 mg total) by mouth daily before breakfast.   EPINEPHRINE  (EPIPEN  2-PAK) 0.3 MG/0.3 ML IJ SOAJ INJECTION    Use as directed for severe allergic reactions   FASENRA  PEN 30 MG/ML PREFILLED AUTOINJECTOR    INJECT 30MG  SUBCUTANEOUSLY EVERY 8 WEEKS   GABAPENTIN  (NEURONTIN ) 300 MG CAPSULE    1 po in am and 12 noon and 2 po at bedtime   GLUCAGON  (BAQSIMI  ONE PACK) 3 MG/DOSE POWD    Place 1 Device into the nose as needed (Low blood sugar with impaired  consciousness).   GLUCOSE 4 GM CHEWABLE TABLET    Chew 1 tablet by mouth once as needed for low blood sugar.   HUMALOG  100 UNIT/ML INJECTION    Inject up too 200 units into pump daily.   INSULIN  DISPOSABLE PUMP (OMNIPOD 5 DEXG7G6 PODS GEN 5) MISC    1 Device by Does not apply route every other day.   ISOSORBIDE  MONONITRATE (IMDUR ) 60 MG 24 HR TABLET    TAKE 1 TABLET BY MOUTH DAILY   LEVALBUTEROL  (XOPENEX  HFA) 45 MCG/ACT INHALER    USE 2 INHALATIONS BY MOUTH EVERY 4 TO 6 HOURS AS NEEDED FOR  SHORTNESS OF BREATH , COUGH,  WHEEZE AND TIGHTNESS IN CHEST   LEVALBUTEROL  (XOPENEX ) 0.63 MG/3ML NEBULIZER SOLUTION    Take 3 mLs (0.63 mg total) by nebulization every 4 (four) hours as needed for wheezing or shortness of breath (coughing fits).   LEVOCETIRIZINE (XYZAL ) 5 MG TABLET    Take 1 tablet (5 mg total) by mouth every evening.   LOSARTAN  (COZAAR ) 25 MG TABLET    Take 1 tablet (25 mg total) by mouth daily.   METHOCARBAMOL  (ROBAXIN ) 500 MG TABLET    Take 1 tablet (500 mg total) by mouth every 6 (six) hours as needed for muscle spasms.   METOPROLOL  TARTRATE (LOPRESSOR ) 50 MG TABLET    TAKE 1 TABLET BY MOUTH EVERY 12  HOURS PT NEEDS TO KEEP UPCOMING  APPOINTMENT IN MAR TO AVOID ANY  MISSED DOSES   MONTELUKAST  (SINGULAIR ) 10 MG TABLET    TAKE 1 TABLET BY MOUTH AT  BEDTIME   NITROGLYCERIN  (NITROSTAT ) 0.4 MG SL TABLET    Place 1 tablet (0.4 mg total) under the tongue every 5 (five) minutes as needed for chest pain.   PANTOPRAZOLE  (PROTONIX ) 40 MG TABLET    TAKE 1 TABLET BY MOUTH IN THE  MORNING AND AT BEDTIME   POTASSIUM CHLORIDE  (KLOR-CON ) 10 MEQ TABLET    Take 1 tablet (10 mEq total) by mouth daily.   RESPIRATORY THERAPY SUPPLIES (CARETOUCH 2 CPAP HOSE HANGER) MISC    by Does not apply route.   ROSUVASTATIN  (CRESTOR ) 40 MG TABLET    TAKE 1 TABLET BY MOUTH AT  BEDTIME  Modified Medications   No medications on file  Discontinued Medications   TIRZEPATIDE (MOUNJARO) 5 MG/0.5ML PEN  Inject 5 mg into the skin  once a week.    Allergies Allergies  Allergen Reactions   Contrast Media [Iodinated Contrast Media] Shortness Of Breath and Other (See Comments)    sob, chest tight 2024, small amount in joint injection tolerated well   Morphine Itching   Ioversol    Oxycodone  Other (See Comments)   Tramadol  Itching and Nausea Only    Past Medical History Past Medical History:  Diagnosis Date   Asthma    CAD S/P two-vessel DES PCI 2008   LAD and RI; 2013 PTCA of small RCA.   CHF (congestive heart failure), NYHA class I, chronic, diastolic (HCC) 2019   Recent Echo 12/16/2021: Mild concentric LVH.  EF 59%.  GI 1 DD.  Normal LAP-(Mildly dilated LA;; has converted from to torsemide  and now on bumetanide    CKD stage 3 due to type 2 diabetes mellitus (HCC)    COPD (chronic obstructive pulmonary disease) (HCC) 2021   Complicated by asthma and seasonal allergies.   Diabetes mellitus, type II, insulin  dependent (HCC) 2010   On insulin  60 units TID Premeal.  Also on Jardiance  and Ozempic.   Eczema    Essential hypertension    Heart attack [facility]) 2008   2008-two-vessel PCI; 2013 PTCA only small RCA   History of left hip replacement 04/2018   Hyperlipidemia associated with type 2 diabetes mellitus (HCC)    140 mg rosuvastatin    Insulin  pump in place    PAD (peripheral artery disease) (HCC)    Status post bilateral SFA stents and right posterior tibial DES stent   Primary hypertension 01/20/2011      Patient's blood pressure is well controlled.  Continue current medications.    Past Surgical History Past Surgical History:  Procedure Laterality Date   AAA DUPLEX  10/09/2020   Max Aorta (sac) diameter 2.51 cm prox with mild ectasia in Prox Aorta. Diffuse plaque in mid-distal Aorta. Bilateral ICA poorly visulailzed - appear to be Severely stenosed with difuse plaque. - Consider CTA of cath directed Angio.   APPENDECTOMY  1974   BIOPSY  02/22/2021   Procedure: BIOPSY;  Surgeon: Alvis Jourdain, MD;   Location: [facility];  Service: Endoscopy;;   CARPAL TUNNEL RELEASE  2017   CARPAL TUNNEL RELEASE Right 01/01/2023   Procedure: RIGHT CARPAL TUNNEL RELEASE REVISION;  Surgeon: Merrill Abide, MD;  Location: [facility];  Service: Orthopedics;  Laterality: Right;   COLONOSCOPY WITH PROPOFOL  N/A 02/22/2021   Procedure: COLONOSCOPY WITH PROPOFOL ;  Surgeon: Alvis Jourdain, MD;  Location: [facility];  Service: Endoscopy;  Laterality: N/A;   CORONARY BALLOON ANGIOPLASTY  2013   PTCA of RCA followed by PCI   CORONARY STENT INTERVENTION  2008   Text DES PCI to LAD and RI/OM1; also prox RCA   ESOPHAGOGASTRODUODENOSCOPY (EGD) WITH PROPOFOL  N/A 02/22/2021   Procedure: ESOPHAGOGASTRODUODENOSCOPY (EGD) WITH PROPOFOL ;  Surgeon: Alvis Jourdain, MD;  Location: [facility];  Service: Endoscopy;  Laterality: N/A;   LEA Dopplers  10/09/2020   R mid & Distal SFA -CTO - dampend flow in R Pop via collaterals - Moderate velocity increase in R PFA. No signficant stenosis in LLE.   R ABI 0.97) - normal. L ABI - 0.91 w/ monophasic flow in L AT -> c/w 11/2019- R SFA new.   LEFT HEART CATH AND CORONARY ANGIOGRAPHY  12/22/2017   Mild LM plaque.  Mild proximal LAD plaque.  High D1-40% ostial.  Patent mid LAD DES (Taxus from 2008), large  distal LAD free disease; Prox LCx-OM1 stents patent (Taxus 2008). Small RCA - patent prox stent(~50-60% ISR), PTCA site from 2013 - patent   LOWER EXTREMITY ANGIOGRAM Bilateral 12/22/2017   Bilateral SFA stents with evidence of severe right and mid left ISR bilateral popliteal arteries proximal trifurcation vessels are patent.  Moderate to severe lesion involving mid R AntTib, poor distal flow in L Ant Tib - 2 V runoff Bilat. --> referred for R SFA Laser Atherectomy & DCB 5 x 120 mm.   LOWER EXTREMITY INTERVENTION Right 12/22/2017   Laser atherectomy of right SFA followed by [facility] with 5.0 x 120 mm impact.-For severe right SFA ISR   LUMBAR SPINE SURGERY  2010   NM MYOVIEW  LTD   07/07/2019   [facility]): Lexiscan.  Nondiagnostic EKG.  Dyspnea with effusion.  No ischemia or infarction.  Soft tissue attenuation noted.Aaron Aas  LVEF 72%.  No RWMA.  LOW RISK.--No change from 11/2017   POLYPECTOMY  02/22/2021   Procedure: POLYPECTOMY;  Surgeon: Alvis Jourdain, MD;  Location: [facility];  Service: Endoscopy;;   REPLACEMENT TOTAL KNEE  2017 and 2018   TONSILLECTOMY     TOTAL ABDOMINAL HYSTERECTOMY  1989   TOTAL HIP ARTHROPLASTY  04/2018   TOTAL SHOULDER ARTHROPLASTY  11/2019   TRANSTHORACIC ECHOCARDIOGRAM  07/04/2019   Normal LV size, mild LVH, hyperdynamic LVEF at >65% with grade 1 diastolic dysfunction.  Otherwise no other significant abnormality.  Poor quality due to patient body habitus.   TRANSTHORACIC ECHOCARDIOGRAM  12/16/2021   [facility]) normal LV size and function.  Moderate concentric LVH.  Normal WM.  EF estimated 59%.  GR 1 DD.  Mild LA dilation.  No valvular lesions.    Family History family history includes Breast cancer in her mother; Eczema in her grandson; Heart attack in her father; Heart disease in her mother; Lung cancer in her father.  Social History Social History   Socioeconomic History   Marital status: Single    Spouse name: Not on file   Number of children: 5   Years of education: Not on file   Highest education level: Associate degree: academic program  Occupational History   Not on file  Tobacco Use   Smoking status: Former    Current packs/day: 0.00    Average packs/day: 0.5 packs/day for 15.0 years (7.5 ttl pk-yrs)    Types: Cigarettes    Start date: 10/13/1980    Quit date: 10/14/1995    Years since quitting: 27.8    Passive exposure: Current   Smokeless tobacco: Never  Vaping Use   Vaping status: Never Used  Substance and Sexual Activity   Alcohol use: Not Currently   Drug use: Not Currently   Sexual activity: Yes  Other Topics Concern   Not on file  Social History Narrative    Not on file   Social Drivers of Health   Financial Resource Strain: Low Risk  (02/09/2023)   Overall Financial Resource Strain (CARDIA)    Difficulty of Paying Living Expenses: Not hard at all  Food Insecurity: No Food Insecurity (02/09/2023)   Hunger Vital Sign    Worried About Running Out of Food in the Last Year: Never true    Ran Out of Food in the Last Year: Never true  Transportation Needs: No Transportation Needs (02/09/2023)   PRAPARE - Administrator, Civil Service (Medical): No    Lack of Transportation (Non-Medical): No  Physical Activity: Inactive (02/09/2023)  Exercise Vital Sign    Days of Exercise per Week: 0 days    Minutes of Exercise per Session: 10 min  Stress: No Stress Concern Present (02/09/2023)   [employer] of Occupational Health - Occupational Stress Questionnaire    Feeling of Stress : Not at all  Social Connections: Moderately Integrated (02/09/2023)   Social Connection and Isolation Panel [NHANES]    Frequency of Communication with Friends and Family: More than three times a week    Frequency of Social Gatherings with Friends and Family: Once a week    Attends Religious Services: More than 4 times per year    Active Member of Clubs or Organizations: Yes    Attends Banker Meetings: More than 4 times per year    Marital Status: Never married  Intimate Partner Violence: Not At Risk (07/16/2021)   Humiliation, Afraid, Rape, and Kick questionnaire    Fear of Current or Ex-Partner: No    Emotionally Abused: No    Physically Abused: No    Sexually Abused: No    Lab Results  Component Value Date   HGBA1C 8.2 (A) 06/22/2023   HGBA1C 7.5 11/04/2022   HGBA1C 7.7 (H) 12/20/2021   Lab Results  Component Value Date   CHOL 108 09/04/2022   Lab Results  Component Value Date   HDL 29.50 (L) 09/04/2022   Lab Results  Component Value Date   LDLCALC 54 09/04/2022   Lab Results  Component Value Date   TRIG 121.0  09/04/2022   Lab Results  Component Value Date   CHOLHDL 4 09/04/2022   Lab Results  Component Value Date   CREATININE 1.24 (H) 05/19/2023   Lab Results  Component Value Date   GFR 44.46 (L) 05/19/2023   Lab Results  Component Value Date   MICROALBUR 22.1 (H) 03/24/2022      Component Value Date/Time   NA 136 05/19/2023 1359   K 3.9 05/19/2023 1359   CL 99 05/19/2023 1359   CO2 30 05/19/2023 1359   GLUCOSE 118 (H) 05/19/2023 1359   BUN 23 05/19/2023 1359   CREATININE 1.24 (H) 05/19/2023 1359   CREATININE 1.02 (H) 05/04/2023 1432   CALCIUM  10.0 05/19/2023 1359   PROT 7.0 05/19/2023 1359   ALBUMIN  4.3 05/19/2023 1359   AST 18 05/19/2023 1359   AST 19 05/04/2023 1432   ALT 25 05/19/2023 1359   ALT 22 05/04/2023 1432   ALKPHOS 84 05/19/2023 1359   BILITOT 0.4 05/19/2023 1359   BILITOT 0.4 05/04/2023 1432   GFRNONAA 60 (L) 05/04/2023 1432      Latest Ref Rng & Units 05/19/2023    1:59 PM 05/04/2023    2:32 PM 02/02/2023    3:14 PM  BMP  Glucose 70 - 99 mg/dL 865  784  696   BUN 6 - 23 mg/dL 23  15  17    Creatinine 0.40 - 1.20 mg/dL 2.95  2.84  1.32   Sodium 135 - 145 mEq/L 136  140  140   Potassium 3.5 - 5.1 mEq/L 3.9  3.8  3.8   Chloride 96 - 112 mEq/L 99  106  106   CO2 19 - 32 mEq/L 30  24  24    Calcium  8.4 - 10.5 mg/dL 44.0  10.2  72.5        Component Value Date/Time   WBC 6.3 05/04/2023 1432   WBC 4.9 09/23/2022 0909   RBC 4.98 05/04/2023 1432   RBC 5.04 05/04/2023  1432   HGB 13.2 05/04/2023 1432   HCT 41.4 05/04/2023 1432   PLT 295 05/04/2023 1432   MCV 83.1 05/04/2023 1432   MCH 26.5 05/04/2023 1432   MCHC 31.9 05/04/2023 1432   RDW 16.6 (H) 05/04/2023 1432   LYMPHSABS 2.4 05/04/2023 1432   MONOABS 0.6 05/04/2023 1432   EOSABS 0.0 05/04/2023 1432   BASOSABS 0.0 05/04/2023 1432     Parts of this note may have been dictated using voice recognition software. There may be variances in spelling and vocabulary which are unintentional. Not all  errors are proofread. Please notify the [facility] if any discrepancies are noted or if the meaning of any statement is not clear.

## 2023-08-03 NOTE — Patient Instructions (Signed)

## 2023-08-04 ENCOUNTER — Ambulatory Visit (INDEPENDENT_AMBULATORY_CARE_PROVIDER_SITE_OTHER): Admitting: Family Medicine

## 2023-08-04 VITALS — BP 158/76 | HR 84 | Temp 98.4°F | Resp 18 | Ht 65.0 in | Wt 269.0 lb

## 2023-08-04 DIAGNOSIS — M25512 Pain in left shoulder: Secondary | ICD-10-CM

## 2023-08-04 DIAGNOSIS — R350 Frequency of micturition: Secondary | ICD-10-CM | POA: Diagnosis not present

## 2023-08-04 LAB — POCT URINALYSIS DIPSTICK
Bilirubin, UA: NEGATIVE
Blood, UA: NEGATIVE
Glucose, UA: NEGATIVE
Ketones, UA: NEGATIVE
Leukocytes, UA: NEGATIVE
Nitrite, UA: NEGATIVE
Protein, UA: POSITIVE — AB
Spec Grav, UA: <=1.005 — AB (ref 1.010–1.025)
Urobilinogen, UA: 0.2 U/dL
pH, UA: 7.5 (ref 5.0–8.0)

## 2023-08-04 MED ORDER — DIAZEPAM 5 MG PO TABS: 5.0000 mg | ORAL_TABLET | Freq: Two times a day (BID) | ORAL | 0 refills | Status: AC | PRN
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
## 2023-08-05 ENCOUNTER — Encounter: Payer: Self-pay | Admitting: Family Medicine

## 2023-08-05 ENCOUNTER — Encounter: Payer: Self-pay | Admitting: "Endocrinology

## 2023-08-05 LAB — COMPREHENSIVE METABOLIC PANEL WITH GFR
ALT: 16 U/L (ref 0–35)
AST: 17 U/L (ref 0–37)
Albumin: 4.3 g/dL (ref 3.5–5.2)
Alkaline Phosphatase: 57 U/L (ref 39–117)
BUN: 13 mg/dL (ref 6–23)
CO2: 27 meq/L (ref 19–32)
Calcium: 9.8 mg/dL (ref 8.4–10.5)
Chloride: 105 meq/L (ref 96–112)
Creatinine, Ser: 1.36 mg/dL — ABNORMAL HIGH (ref 0.40–1.20)
GFR: 39.73 mL/min — ABNORMAL LOW (ref >60.00)
Glucose, Bld: 128 mg/dL — ABNORMAL HIGH (ref 70–99)
Potassium: 3.8 meq/L (ref 3.5–5.1)
Sodium: 139 meq/L (ref 135–145)
Total Bilirubin: 0.3 mg/dL (ref 0.2–1.2)
Total Protein: 7 g/dL (ref 6.0–8.3)

## 2023-08-05 NOTE — Patient Instructions (Signed)
Shoulder Pain Many things can cause shoulder pain, including: An injury to the shoulder. Overuse of the shoulder. Arthritis. The source of the pain can be: Inflammation. An injury to the shoulder joint. An injury to a tendon, ligament, or bone. Follow these instructions at home: Pay attention to changes in your symptoms. Let your health care provider know about them. Follow these instructions to relieve your pain. If you have a removable sling: Wear the sling as told by your provider. Remove it only as told by your provider. Check the skin around the sling every day. Tell your provider about any concerns. Loosen the sling if your fingers tingle, become numb, or become cold. Keep the sling clean. If the sling is not waterproof: Do not let it get wet. Remove it to shower or bathe. Move your arm as little as possible, but keep your hand moving to prevent swelling. Managing pain, stiffness, and swelling  If told, put ice on the painful area. If you have a removable sling or immobilizer, remove it as told by your provider. Put ice in a plastic bag. Place a towel between your skin and the bag. Leave the ice on for 20 minutes, 2-3 times a day. If your skin turns bright red, remove the ice right away to prevent skin damage. The risk of damage is higher if you cannot feel pain, heat, or cold. Move your fingers often to reduce stiffness and swelling. Squeeze a soft ball or a foam pad as much as possible. This helps to keep the shoulder from swelling. It also helps to strengthen the arm. General instructions Take over-the-counter and prescription medicines only as told by your provider. Exercise may help with pain management. Perform exercises if told by your provider. You may be referred to a physical therapist to help in your recovery process. Keep all follow-up visits in order to avoid any type of permanent shoulder disability or chronic pain problems. Contact a health care provider  if: Your pain is not relieved with medicines. New pain develops in your arm, hand, or fingers. You loosen your sling and your arm, hand, or fingers remain tingly, numb, swollen, or painful. Get help right away if: Your arm, hand, or fingers turn white or blue. This information is not intended to replace advice given to you by your health care provider. Make sure you discuss any questions you have with your health care provider. Document Revised: 09/13/2021 Document Reviewed: 09/13/2021 Elsevier Patient Education  2024 Elsevier Inc.  

## 2023-08-06 ENCOUNTER — Encounter: Payer: Self-pay | Admitting: Family

## 2023-08-06 ENCOUNTER — Telehealth: Payer: Self-pay

## 2023-08-06 ENCOUNTER — Other Ambulatory Visit (HOSPITAL_COMMUNITY): Payer: Self-pay

## 2023-08-06 ENCOUNTER — Other Ambulatory Visit: Payer: Self-pay | Admitting: Family Medicine

## 2023-08-06 ENCOUNTER — Ambulatory Visit: Payer: Self-pay | Admitting: Family Medicine

## 2023-08-06 DIAGNOSIS — N390 Urinary tract infection, site not specified: Secondary | ICD-10-CM

## 2023-08-06 LAB — URINE CULTURE
MICRO NUMBER:: 16561557
MICRO NUMBER:: 16561559
SPECIMEN QUALITY:: ADEQUATE
SPECIMEN QUALITY:: ADEQUATE

## 2023-08-06 MED ORDER — CEPHALEXIN 500 MG PO CAPS: 500.0000 mg | ORAL_CAPSULE | Freq: Two times a day (BID) | ORAL | 0 refills | Status: DC

## 2023-08-06 NOTE — Telephone Encounter (Signed)
 Pharmacy Patient Advocate Encounter   Received notification from Patient Advice Request messages that prior authorization for Mounjaro 7.5mg  is required/requested.   Insurance verification completed.   The patient is insured through [facility] .   Per test claim: Refill too soon. PA is not needed at this time. Medication was filled 08/03/23. Next eligible fill date is 08/24/2023.

## 2023-08-07 ENCOUNTER — Encounter: Payer: Self-pay | Admitting: Family

## 2023-08-07 NOTE — Telephone Encounter (Signed)
 Lvm for pt to call back.

## 2023-08-10 ENCOUNTER — Encounter: Payer: Self-pay | Admitting: Family

## 2023-08-10 ENCOUNTER — Other Ambulatory Visit: Payer: Self-pay

## 2023-08-10 DIAGNOSIS — E1165 Type 2 diabetes mellitus with hyperglycemia: Secondary | ICD-10-CM

## 2023-08-10 MED ORDER — TIRZEPATIDE 7.5 MG/0.5ML ~~LOC~~ SOAJ
7.5000 mg | SUBCUTANEOUS | 0 refills | Status: DC
Start: 2023-08-10 — End: 2023-10-12

## 2023-08-10 NOTE — Telephone Encounter (Signed)
 Pt call back regarding mounjaro . Pt does not want to use [facility] pt would like to use [facility].

## 2023-08-11 ENCOUNTER — Encounter: Payer: Self-pay | Admitting: Podiatry

## 2023-08-11 ENCOUNTER — Ambulatory Visit (INDEPENDENT_AMBULATORY_CARE_PROVIDER_SITE_OTHER): Admitting: Podiatry

## 2023-08-11 DIAGNOSIS — L84 Corns and callosities: Secondary | ICD-10-CM | POA: Diagnosis not present

## 2023-08-11 DIAGNOSIS — E1149 Type 2 diabetes mellitus with other diabetic neurological complication: Secondary | ICD-10-CM

## 2023-08-11 DIAGNOSIS — E1140 Type 2 diabetes mellitus with diabetic neuropathy, unspecified: Secondary | ICD-10-CM

## 2023-08-11 NOTE — Progress Notes (Signed)
 This patient presents to the office with chief complaint of painful callus in the center of both feet  She has pain walking and wearing her shoes.  She returns to the office for treatment of her callus.  Patient has history of diabetic neuropathy, CKD and coagulation defect.   Vascular  Dorsalis pedis and posterior tibial pulses are palpable  B/L.  Capillary return  WNL.  Temperature gradient is  WNL.  Skin turgor  WNL  Sensorium  Senn Weinstein monofilament wire  WNL. Normal tactile sensation.  Nail Exam  Patient has normal nails with no evidence of bacterial or fungal infection.  Orthopedic  Exam  Muscle tone and muscle strength  WNL.  No limitations of motion feet  B/L.  No crepitus or joint effusion noted.  Foot type is unremarkable and digits show no abnormalities.  Bony prominences are unremarkable..  Hammer toes 2-5  B/L.  Plantar flexed third metatarsal  B/L.  Skin  No open lesions.  Normal skin texture and turgor.   Porokeratosis secondary to plantar flex metatarsal  B/L.  Debride callus with # 15 blade and dremel tool.  Ruffin Cotton DPM

## 2023-08-17 ENCOUNTER — Ambulatory Visit (INDEPENDENT_AMBULATORY_CARE_PROVIDER_SITE_OTHER): Admitting: Family Medicine

## 2023-08-17 ENCOUNTER — Encounter: Payer: Self-pay | Admitting: Family Medicine

## 2023-08-17 ENCOUNTER — Ambulatory Visit (INDEPENDENT_AMBULATORY_CARE_PROVIDER_SITE_OTHER)

## 2023-08-17 ENCOUNTER — Encounter: Payer: Self-pay | Admitting: "Endocrinology

## 2023-08-17 VITALS — BP 120/68 | HR 88 | Temp 97.9°F | Resp 18 | Ht 65.0 in | Wt 268.2 lb

## 2023-08-17 DIAGNOSIS — R42 Dizziness and giddiness: Secondary | ICD-10-CM | POA: Diagnosis not present

## 2023-08-17 DIAGNOSIS — J4550 Severe persistent asthma, uncomplicated: Secondary | ICD-10-CM

## 2023-08-17 DIAGNOSIS — E1165 Type 2 diabetes mellitus with hyperglycemia: Secondary | ICD-10-CM

## 2023-08-17 LAB — CBC WITH DIFFERENTIAL/PLATELET
Basophils Absolute: 0 10*3/uL (ref 0.0–0.1)
Basophils Relative: 0.9 % (ref 0.0–3.0)
Eosinophils Absolute: 0 10*3/uL (ref 0.0–0.7)
Eosinophils Relative: 0.4 % (ref 0.0–5.0)
HCT: 40.2 % (ref 36.0–46.0)
Hemoglobin: 12.8 g/dL (ref 12.0–15.0)
Lymphocytes Relative: 40 % (ref 12.0–46.0)
Lymphs Abs: 2.2 10*3/uL (ref 0.7–4.0)
MCHC: 31.8 g/dL (ref 30.0–36.0)
MCV: 80.4 fl (ref 78.0–100.0)
Monocytes Absolute: 0.4 10*3/uL (ref 0.1–1.0)
Monocytes Relative: 7.8 % (ref 3.0–12.0)
Neutro Abs: 2.8 10*3/uL (ref 1.4–7.7)
Neutrophils Relative %: 50.9 % (ref 43.0–77.0)
Platelets: 291 10*3/uL (ref 150.0–400.0)
RBC: 5 Mil/uL (ref 3.87–5.11)
RDW: 17 % — ABNORMAL HIGH (ref 11.5–15.5)
WBC: 5.5 10*3/uL (ref 4.0–10.5)

## 2023-08-17 LAB — COMPREHENSIVE METABOLIC PANEL WITH GFR
ALT: 13 U/L (ref 0–35)
AST: 13 U/L (ref 0–37)
Albumin: 4.4 g/dL (ref 3.5–5.2)
Alkaline Phosphatase: 63 U/L (ref 39–117)
BUN: 12 mg/dL (ref 6–23)
CO2: 27 meq/L (ref 19–32)
Calcium: 10.1 mg/dL (ref 8.4–10.5)
Chloride: 104 meq/L (ref 96–112)
Creatinine, Ser: 1 mg/dL (ref 0.40–1.20)
GFR: 57.45 mL/min — ABNORMAL LOW (ref >60.00)
Glucose, Bld: 145 mg/dL — ABNORMAL HIGH (ref 70–99)
Potassium: 4.2 meq/L (ref 3.5–5.1)
Sodium: 139 meq/L (ref 135–145)
Total Bilirubin: 0.5 mg/dL (ref 0.2–1.2)
Total Protein: 6.9 g/dL (ref 6.0–8.3)

## 2023-08-17 LAB — LIPID PANEL
Cholesterol: 114 mg/dL (ref 0–200)
HDL: 36.6 mg/dL — ABNORMAL LOW (ref >39.00)
LDL Cholesterol: 58 mg/dL (ref 0–99)
NonHDL: 77.32
Total CHOL/HDL Ratio: 3
Triglycerides: 97 mg/dL (ref 0.0–149.0)
VLDL: 19.4 mg/dL (ref 0.0–40.0)

## 2023-08-17 LAB — VITAMIN D 25 HYDROXY (VIT D DEFICIENCY, FRACTURES): VITD: 37.86 ng/mL (ref 30.00–100.00)

## 2023-08-17 LAB — VITAMIN B12: Vitamin B-12: 457 pg/mL (ref 211–911)

## 2023-08-17 LAB — TSH: TSH: 1.23 u[IU]/mL (ref 0.35–5.50)

## 2023-08-17 MED ORDER — MECLIZINE HCL 25 MG PO TABS: 25.0000 mg | ORAL_TABLET | Freq: Three times a day (TID) | ORAL | 0 refills | Status: AC | PRN

## 2023-08-17 NOTE — Telephone Encounter (Signed)
 Please advise. Pt sent mychart message for refill for her Ominpod, she has one refill left on her current prescription. Insurance won't allow refill until 09/05/23. Current Prescription was shipped on 07/01/23 and delivered on 07/02/23, Pt state's that she only has two Ominpods left and she is changing it every other day 200 units on insulin .
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
## 2023-08-17 NOTE — Patient Instructions (Signed)
 How to Perform the Epley Maneuver The Epley maneuver is an exercise that relieves symptoms of vertigo. Vertigo is the feeling that you or your surroundings are moving when they are not. When you feel vertigo, you may feel like the room is spinning and may have trouble walking. The Epley maneuver is used for a type of vertigo caused by a calcium deposit in a part of the inner ear. The maneuver involves changing head positions to help the deposit move out of the area. You can do this maneuver at home whenever you have symptoms of vertigo. You can repeat it in 24 hours if your vertigo has not gone away. Even though the Epley maneuver may relieve your vertigo for a few weeks, it is possible that your symptoms will return. This maneuver relieves vertigo, but it does not relieve dizziness. What are the risks? If it is done correctly, the Epley maneuver is considered safe. Sometimes it can lead to dizziness or nausea that goes away after a short time. If you develop other symptoms--such as changes in vision, weakness, or numbness--stop doing the maneuver and call your health care provider. Supplies needed: A bed or table. A pillow. How to do the Epley maneuver     Sit on the edge of a bed or table with your back straight and your legs extended or hanging over the edge of the bed or table. Turn your head halfway toward the affected ear or side as told by your health care provider. Lie backward quickly with your head turned until you are lying flat on your back. Your head should dangle (head-hanging position). You may want to position a pillow under your shoulders. Hold this position for at least 30 seconds. If you feel dizzy or have symptoms of vertigo, continue to hold the position until the symptoms stop. Turn your head to the opposite direction until your unaffected ear is facing down. Your head should continue to dangle. Hold this position for at least 30 seconds. If you feel dizzy or have symptoms of  vertigo, continue to hold the position until the symptoms stop. Turn your whole body to the same side as your head so that you are positioned on your side. Your head will now be nearly facedown and no longer needs to dangle. Hold for at least 30 seconds. If you feel dizzy or have symptoms of vertigo, continue to hold the position until the symptoms stop. Sit back up. You can repeat the maneuver in 24 hours if your vertigo does not go away. Follow these instructions at home: For 24 hours after doing the Epley maneuver: Keep your head in an upright position. When lying down to sleep or rest, keep your head raised (elevated) with two or more pillows. Avoid excessive neck movements. Activity Do not drive or use machinery if you feel dizzy. After doing the Epley maneuver, return to your normal activities as told by your health care provider. Ask your health care provider what activities are safe for you. General instructions Drink enough fluid to keep your urine pale yellow. Do not drink alcohol. Take over-the-counter and prescription medicines only as told by your health care provider. Keep all follow-up visits. This is important. Preventing vertigo symptoms Ask your health care provider if there is anything you should do at home to prevent vertigo. He or she may recommend that you: Keep your head elevated with two or more pillows while you sleep. Do not sleep on the side of your affected ear. Get  up slowly from bed. Avoid sudden movements during the day. Avoid extreme head positions or movement, such as looking up or bending over. Contact a health care provider if: Your vertigo gets worse. You have other symptoms, including: Nausea. Vomiting. Headache. Get help right away if you: Have vision changes. Have a headache or neck pain that is severe or getting worse. Cannot stop vomiting. Have new numbness or weakness in any part of your body. These symptoms may represent a serious problem  that is an emergency. Do not wait to see if the symptoms will go away. Get medical help right away. Call your local emergency services (911 in the [place] Do not drive yourself to the hospital. Summary Vertigo is the feeling that you or your surroundings are moving when they are not. The Epley maneuver is an exercise that relieves symptoms of vertigo. If the Epley maneuver is done correctly, it is considered safe. This information is not intended to replace advice given to you by your health care provider. Make sure you discuss any questions you have with your health care provider. Document Revised: 11/07/2022 Document Reviewed: 11/07/2022 Elsevier Patient Education  2024 [employer].

## 2023-08-18 ENCOUNTER — Telehealth: Payer: Self-pay | Admitting: *Deleted

## 2023-08-18 ENCOUNTER — Encounter: Payer: Self-pay | Admitting: Cardiology

## 2023-08-18 ENCOUNTER — Ambulatory Visit: Attending: Cardiology | Admitting: Cardiology

## 2023-08-18 VITALS — BP 129/64 | Ht 65.0 in | Wt 268.0 lb

## 2023-08-18 DIAGNOSIS — R011 Cardiac murmur, unspecified: Secondary | ICD-10-CM | POA: Diagnosis not present

## 2023-08-18 DIAGNOSIS — I25118 Atherosclerotic heart disease of native coronary artery with other forms of angina pectoris: Secondary | ICD-10-CM | POA: Diagnosis not present

## 2023-08-18 DIAGNOSIS — N183 Chronic kidney disease, stage 3 unspecified: Secondary | ICD-10-CM | POA: Diagnosis not present

## 2023-08-18 DIAGNOSIS — E1122 Type 2 diabetes mellitus with diabetic chronic kidney disease: Secondary | ICD-10-CM

## 2023-08-18 DIAGNOSIS — E1169 Type 2 diabetes mellitus with other specified complication: Secondary | ICD-10-CM

## 2023-08-18 DIAGNOSIS — J4550 Severe persistent asthma, uncomplicated: Secondary | ICD-10-CM | POA: Diagnosis not present

## 2023-08-18 DIAGNOSIS — E785 Hyperlipidemia, unspecified: Secondary | ICD-10-CM | POA: Diagnosis not present

## 2023-08-18 DIAGNOSIS — I5032 Chronic diastolic (congestive) heart failure: Secondary | ICD-10-CM

## 2023-08-18 DIAGNOSIS — I739 Peripheral vascular disease, unspecified: Secondary | ICD-10-CM | POA: Diagnosis not present

## 2023-08-18 DIAGNOSIS — R0609 Other forms of dyspnea: Secondary | ICD-10-CM

## 2023-08-18 DIAGNOSIS — I10 Essential (primary) hypertension: Secondary | ICD-10-CM

## 2023-08-18 MED ORDER — OMNIPOD 5 DEXG7G6 PODS GEN 5 MISC
1.0000 | 3 refills | Status: DC
Start: 2023-08-18 — End: 2023-10-28

## 2023-08-18 NOTE — Patient Instructions (Signed)

## 2023-08-18 NOTE — Telephone Encounter (Signed)
 Lvm for pt to call back regarding Rx for Ominpods.

## 2023-08-18 NOTE — Telephone Encounter (Signed)
 Spoke to pt regarding new Rx for Omnipod. Pt stated that the local [facility] was out of stock and hopefully the get a shipment tomorrow. I told pt if they don't let me know and I'll sent the Rx to a different [facility]. Pt said that she had 2 pods left.

## 2023-08-18 NOTE — Progress Notes (Signed)
 Virtual Visit via Video Note   Because of Katrina Vega's co-morbid illnesses, she is at least at moderate risk for complications without adequate follow up.  This format is felt to be most appropriate for this patient at this time.  All issues noted in this document were discussed and addressed.  A limited physical exam was performed with this format.  Please refer to the patient's chart for her consent to telehealth for Katrina Vega.      Patient has given verbal permission to conduct this visit via virtual appointment and to bill insurance 08/25/2023 7:22 PM     Evaluation Performed:  Follow-up visit  Date:  08/25/2023   ID:  Katrina Vega, DOB [birth year], MRN [id number]  Patient Location: Home Provider Location: Office/Clinic  PCP:  Antonio Cyndee Jamee JONELLE, DO  Cardiologist:  Alm Clay, MD  Electrophysiologist:  None   Chief Complaint:   Chief Complaint  Patient presents with   Follow-up   Coronary Artery Disease   ====================================  Patient Profile: .     Katrina Vega is a morbidly obese 68 y.o. female  with a PMH notable for r CAD (PCI LAD and RI in 2008, RCA and 2013) PAD (right posterior tibial DES, bilateral SFA stents), DM-2, HTN, HLD and HFpEF  who presents here for 42-month virtual follow-up via telehealth medicine.   Katrina Vega presents here for follow-up at the request of Antonio Cyndee Jamee JONELLE, *.      Katrina Vega was last seen on May 18, 2023 for follow-up noting ongoing dyspnea and hoarseness.  She did just completed a course of antibiotics and prednisone  for likely URI complication of asthma.  She had a couple episodes of choking at night but not had any chest pain or pressure.  She is having lots of issues with sciatica pain.  Other than dyspnea no major cardiac symptoms. => 2D echo ordered to assess EF and valvular function based on murmur on exam and dyspnea.  Lower extremity Dopplers ordered.  She was seen by Dr. Court  on 06/24/2023 for abnormal Doppler results.  Was started on Pletal  50 mg twice daily with plans for 32-month follow-up  Subjective  Discussed the use of AI scribe software for clinical note transcription with the patient, who gave verbal consent to proceed.  History of Present Illness History of Present Illness Katrina Vega is a 68 year old female with peripheral artery disease who presents with leg pain and dizziness.  She experiences pain in the lower part of her leg, starting at the crease behind the knee and radiating down to the top half of the calf. The pain occurs both when walking and at rest. She has a history of peripheral artery disease and previously had a stent placed.  The plan for now is preoperative medical management for the artery behind the knee (popliteal artery), as the location is challenging for stent placement. Despite this, her symptoms have persisted, prompting an earlier follow-up appointment.  She experiences dizziness, describing it as a sensation of the room spinning and feeling off balance, which began suddenly one night. Blood work showed abnormalities, and she was informed about having crystals behind the eardrum, potentially contributing to her symptoms.  She has a history of bronchitis, which has improved, but she experiences shortness of breath when lying flat, requiring her to sleep with her head elevated. No waking up at night due to breathing difficulties. She notes swelling in her left  leg, which reduces when she elevates her feet. She uses an adjustable bed to elevate both her head and feet.  Her current medications include Mounjaro , which she believes contributes to dehydration, and Bumex , which she takes as needed. She previously used Jardiance  but discontinued it due to side effects. She also takes Plavix  and reports no issues with bleeding.  She mentions a recent fall that resulted in shoulder pain, for which she received an injection and was advised to  perform exercises. An MRI is scheduled to further evaluate the shoulder pain. She is due to see her orthopedic surgeon on July 1.    No chest pain, heart racing, skipping, or passing out spells. No blood in stool, urine, or epistaxis.   Cardiovascular ROS: no chest pain or dyspnea on exertion positive for - claudication, intermittent edema-relatively well-controlled with Bumex ..  He sleeps sitting up more for comfort. negative for - irregular heartbeat, loss of consciousness, palpitations, paroxysmal nocturnal dyspnea, rapid heart rate, shortness of breath, or near syncope or TIA or amaurosis fugax,    Objective   Current Meds  Medication Sig   BREZTRI  AEROSPHERE 160-9-4.8 MCG/ACT AERO inhaler INHALE 2 INHALATIONS BY MOUTH  TWICE DAILY TO PREVENT COUGH OR  WHEEZE. RINSE, GARGLE, AND SPIT  AFTER USE   budesonide  (PULMICORT ) 0.5 MG/2ML nebulizer solution Take 2 mLs (0.5 mg total) by nebulization in the morning and at bedtime. During respiratory infections for 1-2 weeks at a time.   bumetanide  (BUMEX ) 1 MG tablet TAKE 1 TABLET BY MOUTH DAILY (Patient taking differently: Take 1 mg by mouth as needed.)   Cholecalciferol 50 MCG (2000 UT) TABS Take 2,000 Units by mouth daily.   cilostazol  (PLETAL ) 50 MG tablet Take 1 tablet (50 mg total) by mouth 2 (two) times daily.   clopidogrel  (PLAVIX ) 75 MG tablet TAKE 1 TABLET BY MOUTH DAILY   Continuous Glucose Sensor (DEXCOM G6 SENSOR) MISC 1 Device by Does not apply route continuous.   diazepam  (VALIUM ) 5 MG tablet Take 1 tablet (5 mg total) by mouth every 12 (twelve) hours as needed for anxiety.   EPINEPHrine  (EPIPEN  2-PAK) 0.3 mg/0.3 mL IJ SOAJ injection Use as directed for severe allergic reactions   FASENRA  PEN 30 MG/ML prefilled autoinjector INJECT 30MG  SUBCUTANEOUSLY EVERY 8 WEEKS (Patient taking differently: Per patient taking every 8 weeks)   gabapentin  (NEURONTIN ) 300 MG capsule Take 1 capsule (300 mg total) by mouth 3 (three) times daily.    Glucagon  (BAQSIMI  ONE PACK) 3 MG/DOSE POWD Place 1 Device into the nose as needed (Low blood sugar with impaired consciousness).   glucose 4 GM chewable tablet Chew 1 tablet by mouth once as needed for low blood sugar.   HUMALOG  100 UNIT/ML injection Inject up too 200 units into pump daily.   Insulin  Disposable Pump (OMNIPOD 5 [id number] PODS GEN 5) MISC 1 Device by Does not apply route every other day.   isosorbide  mononitrate (IMDUR ) 60 MG 24 hr tablet TAKE 1 TABLET BY MOUTH DAILY   levalbuterol  (XOPENEX  HFA) 45 MCG/ACT inhaler USE 2 INHALATIONS BY MOUTH EVERY 4 TO 6 HOURS AS NEEDED FOR  SHORTNESS OF BREATH , COUGH,  WHEEZE AND TIGHTNESS IN CHEST   levalbuterol  (XOPENEX ) 0.63 MG/3ML nebulizer solution Take 3 mLs (0.63 mg total) by nebulization every 4 (four) hours as needed for wheezing or shortness of breath (coughing fits).   levocetirizine (XYZAL ) 5 MG tablet Take 1 tablet (5 mg total) by mouth every evening.   losartan  (COZAAR ) 25 MG  tablet Take 1 tablet (25 mg total) by mouth daily.   meclizine  (ANTIVERT ) 25 MG tablet Take 1 tablet (25 mg total) by mouth 3 (three) times daily as needed for dizziness.   methocarbamol  (ROBAXIN ) 500 MG tablet Take 1 tablet (500 mg total) by mouth every 6 (six) hours as needed for muscle spasms.   metoprolol  tartrate (LOPRESSOR ) 50 MG tablet TAKE 1 TABLET BY MOUTH EVERY 12  HOURS PT NEEDS TO KEEP UPCOMING  APPOINTMENT IN MAR TO AVOID ANY  MISSED DOSES (Patient taking differently: Take 50 mg by mouth 2 (two) times daily.)   nitroGLYCERIN  (NITROSTAT ) 0.4 MG SL tablet Place 1 tablet (0.4 mg total) under the tongue every 5 (five) minutes as needed for chest pain.   pantoprazole  (PROTONIX ) 40 MG tablet TAKE 1 TABLET BY MOUTH IN THE  MORNING AND AT BEDTIME (Patient taking differently: 40 mg daily.)   potassium chloride  (KLOR-CON ) 10 MEQ tablet Take 1 tablet (10 mEq total) by mouth daily.   Respiratory Therapy Supplies (CARETOUCH 2 CPAP HOSE HANGER) MISC by Does not apply  route.   rosuvastatin  (CRESTOR ) 40 MG tablet TAKE 1 TABLET BY MOUTH AT  BEDTIME   tirzepatide  (MOUNJARO ) 7.5 MG/0.5ML Pen Inject 7.5 mg into the skin once a week.    Studies Reviewed: SABRA        ECHO: EF 60 to 65%.  No RWMA.  GR 1 DD-mild LA dilation.  Normal RV pressures.  AOV sclerosis with no stenosis.  (06/30/2023)  Lower Extremity Arterial Dopplers: Right CFA 30 to 49% patent superficial femoral artery; 50 to 74% stenosis in the distal superficial femoral artery just distal to stent.  Left-30 to 49% stenosis in the common femoral artery, 75 to 99% stenosis in the distal SFA above the knee popliteal artery; right ABI now 0.98, was 0.97; left ABI 0.87, was 0.90 (06/18/2023)  Lab Results  Component Value Date   CHOL 114 08/17/2023   HDL 36.60 (L) 08/17/2023   LDLCALC 58 08/17/2023   TRIG 97.0 08/17/2023   CHOLHDL 3 08/17/2023   Lab Results  Component Value Date   NA 139 08/17/2023   K 4.2 08/17/2023   CREATININE 1.00 08/17/2023   GFR 57.45 (L) 08/17/2023   GLUCOSE 145 (H) 08/17/2023   Lab Results  Component Value Date   HGBA1C 8.2 (A) 06/22/2023       Latest Ref Rng & Units 08/17/2023   12:18 PM 05/04/2023    2:32 PM 12/02/2022    2:41 PM  CBC  WBC 4.0 - 10.5 K/uL 5.5  6.3  6.5   Hemoglobin 12.0 - 15.0 g/dL 87.1  86.7  87.1   Hematocrit 36.0 - 46.0 % 40.2  41.4  40.8   Platelets 150.0 - 400.0 K/uL 291.0  295  273     Risk Assessment/Calculations:        Katrina Vega's perioperative risk of a major cardiac event is 0.9% according to the Revised Cardiac Risk Index (RCRI).  Therefore, she is at low risk for perioperative complications.   Her functional capacity is fair at 4.64 METs according to the Duke Activity Status Index (DASI). Recommendations: According to ACC/AHA guidelines, no further cardiovascular testing needed.  The patient may proceed to surgery at acceptable risk.   Antiplatelet and/or Anticoagulation Recommendations: Clopidogrel  (Plavix ) can be held for 5-7 days  prior to her surgery and resumed as soon as possible post op. Not on aspirin or a DOAC.     Physical Exam:   VS:  BP 129/64 Comment: Per patient unable to obtain  Ht 5' 5 (1.651 m)   Wt 268 lb (121.6 kg)   BMI 44.60 kg/m    Wt Readings from Last 3 Encounters:  08/18/23 268 lb (121.6 kg)  08/17/23 268 lb 3.2 oz (121.7 kg)  08/04/23 269 lb (122 kg)    GEN: Pleasant mood and affect.  No acute distress.   Normal respiratory effort.     ASSESSMENT AND PLAN: .    Problem List Items Addressed This Visit       Cardiology Problems   Chronic diastolic congestive heart failure (HCC) (Chronic)   Pretty much euvolemic on exam with exception of lower extremity edema. Echo was essentially normal with no indication of CHF. - Recommend foot elevation along with Bumex .  (Taking 1 mg daily).  Okay to take additional dose if necessary PRN worsening edema. 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  No further anginal symptoms.  Some exertional dyspnea which is probably related to deconditioning and obesity. Lipids and blood pressure have been well-controlled - Continue on Lopressor  53 mg twice daily along with Imdur  60 mg for antianginal benefit, and losartan  25 mg daily for BP/afterload reduction . - Continue rosuvastatin  40 mg daily with close lipid monitoring. - Continue Plavix  75 mg daily for combination of CAD and PAD.  Okay to hold Plavix  5 to 7 days preop for surgeries or procedures. - No longer on Jardiance  because of frequent infections.  She is not on Mounjaro .      Hyperlipidemia due to type 2 diabetes mellitus (HCC) (Chronic)   Remains on Crestor  40 mg daily for lipids.  Most recent labs showed LDL of 58 which is essentially at goal. - Continue rosuvastatin  40 mg daily.   - Labs  checked by PCP.  Last A1c was 8.2.  No longer on Jardiance  because of infections.  Now on Mounjaro  7.5 mg daily.  She is on lispro insulin  pump.  Monitored by PCP.        PAD (peripheral artery disease) (HCC): R Post Tib DES, Bilat SFA stents - Primary (Chronic)   Distal SFA up to artery stenosis noted on noninvasive imaging. still having claudication despite medical management.  Due for follow-up with Dr. Court on July 8. - Continue cilostazol  and Plavix . - Encourage ambulation to enhance circulation, with rest for pain relief. -Potentially consider invasive evaluation which would potentially be PTA only. - Continue GDMT meds for CAD.      Primary hypertension   Stable blood pressure on losartan  25 mg daily and metoprolol  tartrate (Lopressor ) 50 mg twice daily along with 60 mg Imdur .        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      DOE (dyspnea on exertion)   Likely multifactorial.  (Obesity, deconditioning/sedentary, chronic asthma, HFpEF) continue to recommend increase activity level. Echo relatively reassuring and no anginal symptoms.      Morbid obesity (HCC) (Chronic)   Recently started on Mounjaro .  Hopefully this will help with weight loss.      Severe persistent asthma without complication (Chronic)   Seems to be doing relatively well.  Tolerating metoprolol  tartrate.      Systolic murmur (Chronic)   Aortic sclerosis with no stenosis.          Follow-Up: Return in about 1 year (around 08/17/2024) for Routine follow up with me, [employer].     Signed, Alm MICAEL Clay, MD, MS Alm Clay, M.D., M.S. Interventional Chartered certified accountant  Pager # [phone]

## 2023-08-18 NOTE — Telephone Encounter (Signed)
.  Called left message on voicemail  to informed patient no changes with AVS. She may review in Mychart.  Any question  may call or send a message through mychart

## 2023-08-18 NOTE — Telephone Encounter (Signed)
 error

## 2023-08-19 ENCOUNTER — Encounter: Payer: Self-pay | Admitting: Family

## 2023-08-19 ENCOUNTER — Telehealth: Payer: Self-pay

## 2023-08-19 NOTE — Telephone Encounter (Signed)
 Just got off the phone with pt and Pharmacy regarding Pt Rx for Ominpods. Insurance will not pay for refill until the 09/05/23 due to refill been fill too soon. Pt is currently out of Ominpods. Please advise.

## 2023-08-20 ENCOUNTER — Other Ambulatory Visit: Payer: Self-pay

## 2023-08-20 ENCOUNTER — Encounter: Payer: Self-pay | Admitting: "Endocrinology

## 2023-08-20 ENCOUNTER — Telehealth (INDEPENDENT_AMBULATORY_CARE_PROVIDER_SITE_OTHER): Admitting: "Endocrinology

## 2023-08-20 DIAGNOSIS — E1165 Type 2 diabetes mellitus with hyperglycemia: Secondary | ICD-10-CM

## 2023-08-20 DIAGNOSIS — Z9641 Presence of insulin pump (external) (internal): Secondary | ICD-10-CM

## 2023-08-20 DIAGNOSIS — Z7985 Long-term (current) use of injectable non-insulin antidiabetic drugs: Secondary | ICD-10-CM

## 2023-08-20 DIAGNOSIS — E7800 Pure hypercholesterolemia, unspecified: Secondary | ICD-10-CM | POA: Diagnosis not present

## 2023-08-20 MED ORDER — INSULIN LISPRO (1 UNIT DIAL) 100 UNIT/ML (KWIKPEN): PEN_INJECTOR | SUBCUTANEOUS | 2 refills | Status: DC

## 2023-08-20 MED ORDER — PEN NEEDLES 32G X 4 MM MISC: 1.0000 | Freq: Four times a day (QID) | 2 refills | Status: DC

## 2023-08-20 MED ORDER — LANTUS SOLOSTAR 100 UNIT/ML ~~LOC~~ SOPN: 55.0000 [IU] | PEN_INJECTOR | Freq: Every day | SUBCUTANEOUS | 2 refills | Status: DC

## 2023-08-20 NOTE — Patient Instructions (Addendum)
 Will recommend the following: Lantus 60 units once a day Humalog  bolus: 15 min before meals for 1 unit for every 4 grams of carbohydrates  Correction scale: Use in addition to your meal time/short acting insulin  based on blood sugars as follows:  151 - 175: 1 unit 176 - 200: 2 units 201 - 225: 3 units 226 - 250: 4 units 251 - 275: 5 units 276 - 300: 6 units 301 - 325: 7 units 326 - 350: 8 units 351 - 375: 9 units 376 - 400: 10 units

## 2023-08-20 NOTE — Progress Notes (Signed)
 The patient reports they are currently: Katrina Vega. I spent 12 minutes on the video with the patient on the date of service. I spent an additional 5 minutes on pre- and post-visit activities on the date of service.   The patient was physically located in [place]  or a state in which I am permitted to provide care. The patient and/or parent/guardian understood that s/he may incur co-pays and cost sharing, and agreed to the telemedicine visit. The visit was reasonable and appropriate under the circumstances given the patient's presentation at the time.  The patient and/or parent/guardian has been advised of the potential risks and limitations of this mode of treatment (including, but not limited to, the absence of in-person examination) and has agreed to be treated using telemedicine. The patient's/patient's family's questions regarding telemedicine have been answered.   The patient and/or parent/guardian has also been advised to contact their provider's office for worsening conditions, and seek emergency medical treatment and/or call 911 if the patient deems either necessary.     Outpatient Endocrinology Note Katrina Birmingham, MD  08/20/23   Katrina Vega 70-02-1955 [id number]  Referring Provider: Antonio Meth, [name], * Primary Care Provider: Antonio Meth, [name], DO Reason for consultation: Subjective   Assessment & Plan  There are no diagnoses linked to this encounter.   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, +  neuropathy, +  nephropathy,  Lab Results  Component Value Date   GFR 57.45 (L) 08/17/2023   Hba1c goal less than 7, current Hba1c is  Lab Results  Component Value Date   HGBA1C 8.2 (A) 06/22/2023   Will recommend the following:  Running out of pods so starting back up: Mounjaro  7.5 mg weekly to help with further weight loss  Will recommend the following: Lantus 60 units once a day Humalog  bolus: 15 min before meals for 1 unit for every 4 grams of carbohydrates   Correction scale: Use in addition to your meal time/short acting insulin  based on blood sugars as follows:  151 - 175: 1 unit 176 - 200: 2 units 201 - 225: 3 units 226 - 250: 4 units 251 - 275: 5 units 276 - 300: 6 units 301 - 325: 7 units 326 - 350: 8 units 351 - 375: 9 units 376 - 400: 10 units   Once pump pods are resumed, pt will stop basal bolus insulin  inj and resume pump after 20 hours of last lantus shot  Stopped Ozempic 2mg /week (lost 20 lbs initially, then started gaining back) Mounjaro  7.5 mg weekly to help with further weight loss  Omnipod 5 with DexCom G6 with Humalog  U100 Patient reports pod dysfunction after it broke, setup new pod today Basal: 2.3u/hr I:C 1:4 ISF 1:25 IOB 3 hrs Target 110, correct above 120  Jardiance  10 mg every day - stopped taking it due to yeast infections   No history of MEN syndrome/medullary thyroid  cancer/pancreatitis or pancreatic cancer in self or family No known contraindications/side effects to any of above medications Glucagon  discussed and prescribed with refills on 06/22/23   -Last LD and Tg are as follows: Lab Results  Component Value Date   LDLCALC 58 08/17/2023    Lab Results  Component Value Date   TRIG 97.0 08/17/2023   -On rosuvastatin  40 mg QD -Follow low fat diet and exercise   -Blood pressure goal <140/90 - Microalbumin/creatinine goal is < 30 -Last MA/Cr is as follows: Lab Results  Component Value Date   MICROALBUR 22.1 (H)  03/24/2022   -on ACE/ARB losartan  25 mg every day (prev. on lisinopril  20 mg every day?) -diet changes including salt restriction -limit eating outside -counseled BP targets per standards of diabetes care -uncontrolled blood pressure can lead to retinopathy, nephropathy and cardiovascular and atherosclerotic heart disease  Reviewed and counseled on: -A1C target -Blood sugar targets -Complications of uncontrolled diabetes  -Checking blood sugar before meals and bedtime and bring  log next visit -All medications with mechanism of action and side effects -Hypoglycemia management: rule of 15's, Glucagon  Emergency Kit and medical alert ID -low-carb low-fat plate-method diet -At least 20 minutes of physical activity per day -Annual dilated retinal eye exam and foot exam -compliance and follow up needs -follow up as scheduled or earlier if problem gets worse  Call if blood sugar is less than 70 or consistently above 250    Take a 15 gm snack of carbohydrate at bedtime before you go to sleep if your blood sugar is less than 100.    If you are going to fast after midnight for a test or procedure, ask your physician for instructions on how to reduce/decrease your insulin  dose.    Call if blood sugar is less than 70 or consistently above 250  -Treating a low sugar by rule of 15  (15 gms of sugar every 15 min until sugar is more than 70) If you feel your sugar is low, test your sugar to be sure If your sugar is low (less than 70), then take 15 grams of a fast acting Carbohydrate (3-4 glucose tablets or glucose gel or 4 ounces of juice or regular soda) Recheck your sugar 15 min after treating low to make sure it is more than 70 If sugar is still less than 70, treat again with 15 grams of carbohydrate          Don't drive the hour of hypoglycemia  If unconscious/unable to eat or drink by mouth, use glucagon  injection or nasal spray baqsimi  and call 911. Can repeat again in 15 min if still unconscious.  Return in about 4 weeks (around 09/17/2023).   I have reviewed current medications, nurse's notes, allergies, vital signs, past medical and surgical history, family medical history, and social history for this encounter. Counseled patient on symptoms, examination findings, lab findings, imaging results, treatment decisions and monitoring and prognosis. The patient understood the recommendations and agrees with the treatment plan. All questions regarding treatment plan were fully  answered.  Katrina Birmingham, MD  08/20/23    History of Present Illness Katrina Vega is a 68 y.o. year old female who presents for follow up on Type II diabetes mellitus.  Katrina Vega was first diagnosed around 2010.   Diabetes education +  Home diabetes regimen: Mounjaro  7.5 mg weekly  Omnipod 5 with DexCom G6 with Humalog  U100  Basal: 2.2u/hr I:C 1:4 ISF 1:25 IOB 3 hrs Target 110, correct above 120  Stopped Ozempic 2mg /week (lost 20 lbs initially, then started gaining back) Stopped Jardiance  10 mg every day due to yeast infections   COMPLICATIONS +  MI/Stroke -  retinopathy +  neuropathy +  nephropathy  SYMPTOMS REVIEWED - Polyuria - Weight loss - Blurred vision  BLOOD SUGAR DATA  CGM interpretation: At today's visit, we reviewed her CGM downloads. The full report is scanned in the media. Reviewing the CGM trends, BG are high across the day.  Physical Exam  There were no vitals taken for this visit.   Constitutional: well developed,  well nourished Head: normocephalic, atraumatic Eyes: sclera anicteric, no redness Neck: supple Lungs: normal respiratory effort Neurology: alert and oriented Skin: dry, no appreciable rashes Musculoskeletal: no appreciable defects Psychiatric: normal mood and affect Diabetic Foot Exam - Simple   No data filed      Current Medications Patient's Medications  New Prescriptions   INSULIN  GLARGINE (LANTUS SOLOSTAR) 100 UNIT/ML SOLOSTAR PEN    Inject 55 Units into the skin daily.   INSULIN  LISPRO (HUMALOG  KWIKPEN) 100 UNIT/ML KWIKPEN    Based on carb and correction ratio, max dose 80 units a day   INSULIN  PEN NEEDLE (PEN NEEDLES) 32G X 4 MM MISC    1 Device by Does not apply route in the morning, at noon, in the evening, and at bedtime.  Previous Medications   BREZTRI  AEROSPHERE 160-9-4.8 MCG/ACT AERO INHALER    INHALE 2 INHALATIONS BY MOUTH  TWICE DAILY TO PREVENT COUGH OR  WHEEZE. RINSE, GARGLE, AND SPIT  AFTER USE    BUDESONIDE  (PULMICORT ) 0.5 MG/2ML NEBULIZER SOLUTION    Take 2 mLs (0.5 mg total) by nebulization in the morning and at bedtime. During respiratory infections for 1-2 weeks at a time.   BUMETANIDE  (BUMEX ) 1 MG TABLET    TAKE 1 TABLET BY MOUTH DAILY   CEPHALEXIN  (KEFLEX ) 500 MG CAPSULE    Take 1 capsule (500 mg total) by mouth 2 (two) times daily.   CHOLECALCIFEROL 50 MCG (2000 UT) TABS    Take 2,000 Units by mouth daily.   CILOSTAZOL  (PLETAL ) 50 MG TABLET    Take 1 tablet (50 mg total) by mouth 2 (two) times daily.   CLOPIDOGREL  (PLAVIX ) 75 MG TABLET    TAKE 1 TABLET BY MOUTH DAILY   CONTINUOUS BLOOD GLUC SENSOR (DEXCOM G6 SENSOR) MISC    USE 1 SENSOR AS NEEDED CHANGE EVERY 10 DAYS   CONTINUOUS BLOOD GLUC TRANSMIT (DEXCOM G6 TRANSMITTER) MISC       CONTINUOUS GLUCOSE SENSOR (DEXCOM G6 SENSOR) MISC    1 Device by Does not apply route continuous.   DIAZEPAM  (VALIUM ) 5 MG TABLET    Take 1 tablet (5 mg total) by mouth every 12 (twelve) hours as needed for anxiety.   EMPAGLIFLOZIN  (JARDIANCE ) 10 MG TABS TABLET    Take 1 tablet (10 mg total) by mouth daily before breakfast.   EPINEPHRINE  (EPIPEN  2-PAK) 0.3 MG/0.3 ML IJ SOAJ INJECTION    Use as directed for severe allergic reactions   FASENRA  PEN 30 MG/ML PREFILLED AUTOINJECTOR    INJECT 30MG  SUBCUTANEOUSLY EVERY 8 WEEKS   GABAPENTIN  (NEURONTIN ) 300 MG CAPSULE    Take 1 capsule (300 mg total) by mouth 3 (three) times daily.   GLUCAGON  (BAQSIMI  ONE PACK) 3 MG/DOSE POWD    Place 1 Device into the nose as needed (Low blood sugar with impaired consciousness).   GLUCOSE 4 GM CHEWABLE TABLET    Chew 1 tablet by mouth once as needed for low blood sugar.   HUMALOG  100 UNIT/ML INJECTION    Inject up too 200 units into pump daily.   INSULIN  DISPOSABLE PUMP (OMNIPOD 5 DEXG7G6 PODS GEN 5) MISC    1 Device by Does not apply route every other day.   ISOSORBIDE  MONONITRATE (IMDUR ) 60 MG 24 HR TABLET    TAKE 1 TABLET BY MOUTH DAILY   LEVALBUTEROL  (XOPENEX  HFA) 45  MCG/ACT INHALER    USE 2 INHALATIONS BY MOUTH EVERY 4 TO 6 HOURS AS NEEDED FOR  SHORTNESS OF BREATH , COUGH,  WHEEZE AND TIGHTNESS IN CHEST   LEVALBUTEROL  (XOPENEX ) 0.63 MG/3ML NEBULIZER SOLUTION    Take 3 mLs (0.63 mg total) by nebulization every 4 (four) hours as needed for wheezing or shortness of breath (coughing fits).   LEVOCETIRIZINE (XYZAL ) 5 MG TABLET    Take 1 tablet (5 mg total) by mouth every evening.   LOSARTAN  (COZAAR ) 25 MG TABLET    Take 1 tablet (25 mg total) by mouth daily.   MECLIZINE (ANTIVERT) 25 MG TABLET    Take 1 tablet (25 mg total) by mouth 3 (three) times daily as needed for dizziness.   METHOCARBAMOL  (ROBAXIN ) 500 MG TABLET    Take 1 tablet (500 mg total) by mouth every 6 (six) hours as needed for muscle spasms.   METOPROLOL  TARTRATE (LOPRESSOR ) 50 MG TABLET    TAKE 1 TABLET BY MOUTH EVERY 12  HOURS PT NEEDS TO KEEP UPCOMING  APPOINTMENT IN MAR TO AVOID ANY  MISSED DOSES   MONTELUKAST  (SINGULAIR ) 10 MG TABLET    TAKE 1 TABLET BY MOUTH AT  BEDTIME   NITROGLYCERIN  (NITROSTAT ) 0.4 MG SL TABLET    Place 1 tablet (0.4 mg total) under the tongue every 5 (five) minutes as needed for chest pain.   PANTOPRAZOLE  (PROTONIX ) 40 MG TABLET    TAKE 1 TABLET BY MOUTH IN THE  MORNING AND AT BEDTIME   POTASSIUM CHLORIDE  (KLOR-CON ) 10 MEQ TABLET    Take 1 tablet (10 mEq total) by mouth daily.   RESPIRATORY THERAPY SUPPLIES (CARETOUCH 2 CPAP HOSE HANGER) MISC    by Does not apply route.   ROSUVASTATIN  (CRESTOR ) 40 MG TABLET    TAKE 1 TABLET BY MOUTH AT  BEDTIME   TIRZEPATIDE  (MOUNJARO ) 7.5 MG/0.5ML PEN    Inject 7.5 mg into the skin once a week.  Modified Medications   No medications on file  Discontinued Medications   No medications on file    Allergies Allergies  Allergen Reactions   Contrast Media [Iodinated Contrast Media] Shortness Of Breath and Other (See Comments)    sob, chest tight 2024, small amount in joint injection tolerated well   Morphine Itching   Ioversol     Oxycodone  Other (See Comments)   Tramadol  Itching and Nausea Only    Past Medical History Past Medical History:  Diagnosis Date   Asthma    CAD S/P two-vessel DES PCI 2008   LAD and RI; 2013 PTCA of small RCA.   CHF (congestive heart failure), NYHA class I, chronic, diastolic (HCC) 2019   Recent Echo 12/16/2021: Mild concentric LVH.  EF 59%.  GI 1 DD.  Normal LAP-(Mildly dilated LA;; has converted from to torsemide  and now on bumetanide    CKD stage 3 due to type 2 diabetes mellitus (HCC)    COPD (chronic obstructive pulmonary disease) (HCC) 2021   Complicated by asthma and seasonal allergies.   Diabetes mellitus, type II, insulin  dependent (HCC) 2010   On insulin  60 units TID Premeal.  Also on Jardiance  and Ozempic.   Eczema    Essential hypertension    Heart attack (HCC) 2008   2008-two-vessel PCI; 2013 PTCA only small RCA   History of left hip replacement 04/2018   Hyperlipidemia associated with type 2 diabetes mellitus (HCC)    140 mg rosuvastatin    Insulin  pump in place    PAD (peripheral artery disease) (HCC)    Status post bilateral SFA stents and right posterior tibial DES stent   Primary hypertension 01/20/2011  Patient's blood pressure is well controlled.  Continue current medications.    Past Surgical History Past Surgical History:  Procedure Laterality Date   AAA DUPLEX  10/09/2020   Max Aorta (sac) diameter 2.51 cm prox with mild ectasia in Prox Aorta. Diffuse plaque in mid-distal Aorta. Bilateral ICA poorly visulailzed - appear to be Severely stenosed with difuse plaque. - Consider CTA of cath directed Angio.   APPENDECTOMY  1974   BIOPSY  02/22/2021   Procedure: BIOPSY;  Surgeon: Rollin Dover, MD;  Location: [facility];  Service: Endoscopy;;   CARPAL TUNNEL RELEASE  2017   CARPAL TUNNEL RELEASE Right 01/01/2023   Procedure: RIGHT CARPAL TUNNEL RELEASE REVISION;  Surgeon: Arlinda Buster, MD;  Location: [facility];  Service: Orthopedics;   Laterality: Right;   COLONOSCOPY WITH PROPOFOL  N/A 02/22/2021   Procedure: COLONOSCOPY WITH PROPOFOL ;  Surgeon: Rollin Dover, MD;  Location: [facility];  Service: Endoscopy;  Laterality: N/A;   CORONARY BALLOON ANGIOPLASTY  2013   PTCA of RCA followed by PCI   CORONARY STENT INTERVENTION  2008   Text DES PCI to LAD and RI/OM1; also prox RCA   ESOPHAGOGASTRODUODENOSCOPY (EGD) WITH PROPOFOL  N/A 02/22/2021   Procedure: ESOPHAGOGASTRODUODENOSCOPY (EGD) WITH PROPOFOL ;  Surgeon: Rollin Dover, MD;  Location: [facility];  Service: Endoscopy;  Laterality: N/A;   LEA Dopplers  10/09/2020   R mid & Distal SFA -CTO - dampend flow in R Pop via collaterals - Moderate velocity increase in R PFA. No signficant stenosis in LLE.   R ABI 0.97) - normal. L ABI - 0.91 w/ monophasic flow in L AT -> c/w 11/2019- R SFA new.   LEFT HEART CATH AND CORONARY ANGIOGRAPHY  12/22/2017   Mild LM plaque.  Mild proximal LAD plaque.  High D1-40% ostial.  Patent mid LAD DES (Taxus from 2008), large distal LAD free disease; Prox LCx-OM1 stents patent (Taxus 2008). Small RCA - patent prox stent(~50-60% ISR), PTCA site from 2013 - patent   LOWER EXTREMITY ANGIOGRAM Bilateral 12/22/2017   Bilateral SFA stents with evidence of severe right and mid left ISR bilateral popliteal arteries proximal trifurcation vessels are patent.  Moderate to severe lesion involving mid R AntTib, poor distal flow in L Ant Tib - 2 V runoff Bilat. --> referred for R SFA Laser Atherectomy & DCB 5 x 120 mm.   LOWER EXTREMITY INTERVENTION Right 12/22/2017   Laser atherectomy of right SFA followed by [facility] with 5.0 x 120 mm impact.-For severe right SFA ISR   LUMBAR SPINE SURGERY  2010   NM MYOVIEW  LTD  07/07/2019   [facility]): Lexiscan.  Nondiagnostic EKG.  Dyspnea with effusion.  No ischemia or infarction.  Soft tissue attenuation noted.SABRA  LVEF 72%.  No RWMA.  LOW RISK.--No change from 11/2017   POLYPECTOMY  02/22/2021   Procedure:  POLYPECTOMY;  Surgeon: Rollin Dover, MD;  Location: [facility];  Service: Endoscopy;;   REPLACEMENT TOTAL KNEE  2017 and 2018   TONSILLECTOMY     TOTAL ABDOMINAL HYSTERECTOMY  1989   TOTAL HIP ARTHROPLASTY  04/2018   TOTAL SHOULDER ARTHROPLASTY  11/2019   TRANSTHORACIC ECHOCARDIOGRAM  07/04/2019   Normal LV size, mild LVH, hyperdynamic LVEF at >65% with grade 1 diastolic dysfunction.  Otherwise no other significant abnormality.  Poor quality due to patient body habitus.   TRANSTHORACIC ECHOCARDIOGRAM  12/16/2021   [facility]) normal LV size and function.  Moderate concentric LVH.  Normal WM.  EF estimated 59%.  GR 1 DD.  Mild LA dilation.  No valvular lesions.    Family History family history includes Breast cancer in her mother; Eczema in her grandson; Heart attack in her father; Heart disease in her mother; Lung cancer in her father.  Social History Social History   Socioeconomic History   Marital status: Single    Spouse name: Not on file   Number of children: 5   Years of education: Not on file   Highest education level: Associate degree: academic program  Occupational History   Not on file  Tobacco Use   Smoking status: Former    Current packs/day: 0.00    Average packs/day: 0.5 packs/day for 15.0 years (7.5 ttl pk-yrs)    Types: Cigarettes    Start date: 10/13/1980    Quit date: 10/14/1995    Years since quitting: 27.8    Passive exposure: Current   Smokeless tobacco: Never  Vaping Use   Vaping status: Never Used  Substance and Sexual Activity   Alcohol use: Not Currently   Drug use: Not Currently   Sexual activity: Yes  Other Topics Concern   Not on file  Social History Narrative   Not on file   Social Drivers of Health   Financial Resource Strain: Low Risk  (08/15/2023)   Overall Financial Resource Strain (CARDIA)    Difficulty of Paying Living Expenses: Not very hard  Food Insecurity: No Food Insecurity (08/15/2023)   Hunger Vital  Sign    Worried About Running Out of Food in the Last Year: Never true    Ran Out of Food in the Last Year: Never true  Transportation Needs: No Transportation Needs (08/15/2023)   PRAPARE - Administrator, Civil Service (Medical): No    Lack of Transportation (Non-Medical): No  Physical Activity: Insufficiently Active (08/15/2023)   Exercise Vital Sign    Days of Exercise per Week: 1 day    Minutes of Exercise per Session: 20 min  Stress: No Stress Concern Present (08/15/2023)   [employer] of Occupational Health - Occupational Stress Questionnaire    Feeling of Stress: Only a little  Social Connections: Moderately Integrated (08/15/2023)   Social Connection and Isolation Panel    Frequency of Communication with Friends and Family: More than three times a week    Frequency of Social Gatherings with Friends and Family: Once a week    Attends Religious Services: More than 4 times per year    Active Member of Clubs or Organizations: Yes    Attends Banker Meetings: More than 4 times per year    Marital Status: Never married  Intimate Partner Violence: Not At Risk (07/16/2021)   Humiliation, Afraid, Rape, and Kick questionnaire    Fear of Current or Ex-Partner: No    Emotionally Abused: No    Physically Abused: No    Sexually Abused: No    Lab Results  Component Value Date   HGBA1C 8.2 (A) 06/22/2023   HGBA1C 7.5 11/04/2022   HGBA1C 7.7 (H) 12/20/2021   Lab Results  Component Value Date   CHOL 114 08/17/2023   Lab Results  Component Value Date   HDL 36.60 (L) 08/17/2023   Lab Results  Component Value Date   LDLCALC 58 08/17/2023   Lab Results  Component Value Date   TRIG 97.0 08/17/2023   Lab Results  Component Value Date   CHOLHDL 3 08/17/2023   Lab Results  Component Value Date   CREATININE 1.00  08/17/2023   Lab Results  Component Value Date   GFR 57.45 (L) 08/17/2023   Lab Results  Component Value Date   MICROALBUR 22.1 (H)  03/24/2022      Component Value Date/Time   NA 139 08/17/2023 1218   K 4.2 08/17/2023 1218   CL 104 08/17/2023 1218   CO2 27 08/17/2023 1218   GLUCOSE 145 (H) 08/17/2023 1218   BUN 12 08/17/2023 1218   CREATININE 1.00 08/17/2023 1218   CREATININE 1.02 (H) 05/04/2023 1432   CALCIUM  10.1 08/17/2023 1218   PROT 6.9 08/17/2023 1218   ALBUMIN  4.4 08/17/2023 1218   AST 13 08/17/2023 1218   AST 19 05/04/2023 1432   ALT 13 08/17/2023 1218   ALT 22 05/04/2023 1432   ALKPHOS 63 08/17/2023 1218   BILITOT 0.5 08/17/2023 1218   BILITOT 0.4 05/04/2023 1432   GFRNONAA 60 (L) 05/04/2023 1432      Latest Ref Rng & Units 08/17/2023   12:18 PM 08/04/2023    6:22 PM 05/19/2023    1:59 PM  BMP  Glucose 70 - 99 mg/dL 854  871  881   BUN 6 - 23 mg/dL 12  13  23    Creatinine 0.40 - 1.20 mg/dL 8.99  8.63  8.75   Sodium 135 - 145 mEq/L 139  139  136   Potassium 3.5 - 5.1 mEq/L 4.2  3.8  3.9   Chloride 96 - 112 mEq/L 104  105  99   CO2 19 - 32 mEq/L 27  27  30    Calcium  8.4 - 10.5 mg/dL 89.8  9.8  89.9        Component Value Date/Time   WBC 5.5 08/17/2023 1218   RBC 5.00 08/17/2023 1218   HGB 12.8 08/17/2023 1218   HGB 13.2 05/04/2023 1432   HCT 40.2 08/17/2023 1218   PLT 291.0 08/17/2023 1218   PLT 295 05/04/2023 1432   MCV 80.4 08/17/2023 1218   MCH 26.5 05/04/2023 1432   MCHC 31.8 08/17/2023 1218   RDW 17.0 (H) 08/17/2023 1218   LYMPHSABS 2.2 08/17/2023 1218   MONOABS 0.4 08/17/2023 1218   EOSABS 0.0 08/17/2023 1218   BASOSABS 0.0 08/17/2023 1218     Parts of this note may have been dictated using voice recognition software. There may be variances in spelling and vocabulary which are unintentional. Not all errors are proofread. Please notify the dino if any discrepancies are noted or if the meaning of any statement is not clear.

## 2023-08-21 ENCOUNTER — Encounter: Payer: Self-pay | Admitting: Family

## 2023-08-21 ENCOUNTER — Ambulatory Visit
Admission: RE | Admit: 2023-08-21 | Discharge: 2023-08-21 | Disposition: A | Discharge status: Home / Self Care | Source: Ambulatory Visit | Attending: Family Medicine | Admitting: Family Medicine

## 2023-08-21 DIAGNOSIS — M25512 Pain in left shoulder: Secondary | ICD-10-CM

## 2023-08-23 ENCOUNTER — Other Ambulatory Visit: Payer: Self-pay | Admitting: Family Medicine

## 2023-08-23 ENCOUNTER — Ambulatory Visit: Payer: Self-pay | Admitting: Family Medicine

## 2023-08-23 DIAGNOSIS — M25512 Pain in left shoulder: Secondary | ICD-10-CM

## 2023-08-24 ENCOUNTER — Encounter: Payer: Self-pay | Admitting: Family

## 2023-08-25 ENCOUNTER — Encounter: Payer: Self-pay | Admitting: Family

## 2023-08-25 ENCOUNTER — Encounter: Payer: Self-pay | Admitting: Cardiology

## 2023-08-25 ENCOUNTER — Ambulatory Visit: Admitting: Orthopaedic Surgery

## 2023-08-25 DIAGNOSIS — M25512 Pain in left shoulder: Secondary | ICD-10-CM

## 2023-08-25 NOTE — Assessment & Plan Note (Signed)
 Likely multifactorial.  (Obesity, deconditioning/sedentary, chronic asthma, HFpEF) continue to recommend increase activity level. Echo relatively reassuring and no anginal symptoms.

## 2023-08-25 NOTE — Assessment & Plan Note (Addendum)
 Distant history of PCI for non-STEMI.  No further anginal symptoms.  Some exertional dyspnea which is probably related to deconditioning and obesity. Lipids and blood pressure have been well-controlled - Continue on Lopressor  53 mg twice daily along with Imdur  60 mg for antianginal benefit, and losartan  25 mg daily for BP/afterload reduction . - Continue rosuvastatin  40 mg daily with close lipid monitoring. - Continue Plavix  75 mg daily for combination of CAD and PAD.  Okay to hold Plavix  5 to 7 days preop for surgeries or procedures. - No longer on Jardiance  because of frequent infections.  She is not on Mounjaro .

## 2023-08-25 NOTE — Assessment & Plan Note (Signed)
 Most recent creatinine was 1.0 indicating NYHA class IIIa if not class II.

## 2023-08-25 NOTE — Assessment & Plan Note (Addendum)
 Remains on Crestor  40 mg daily for lipids.  Most recent labs showed LDL of 58 which is essentially at goal. - Continue rosuvastatin  40 mg daily.   - Labs checked by PCP.  Last A1c was 8.2.  No longer on Jardiance  because of infections.  Now on Mounjaro  7.5 mg daily.  She is on lispro insulin  pump.  Monitored by PCP.

## 2023-08-25 NOTE — Assessment & Plan Note (Signed)
 Seems to be doing relatively well.  Tolerating metoprolol  tartrate.

## 2023-08-25 NOTE — Assessment & Plan Note (Signed)
 Recently started on Mounjaro .  Hopefully this will help with weight loss.

## 2023-08-25 NOTE — Assessment & Plan Note (Signed)
 Stable blood pressure on losartan  25 mg daily and metoprolol  tartrate (Lopressor ) 50 mg twice daily along with 60 mg Imdur .

## 2023-08-25 NOTE — Assessment & Plan Note (Signed)
 Aortic sclerosis with no stenosis.
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
## 2023-08-25 NOTE — Assessment & Plan Note (Addendum)
 Pretty much euvolemic on exam with exception of lower extremity edema. Echo was essentially normal with no indication of CHF. - Recommend foot elevation along with Bumex .  (Taking 1 mg daily).  Okay to take additional dose if necessary PRN worsening edema. - On Lopressor  50 mg twice daily for rate control, plus losartan  25 mg for afterload reduction

## 2023-08-25 NOTE — Assessment & Plan Note (Addendum)
 Distal SFA up to artery stenosis noted on noninvasive imaging. still having claudication despite medical management.  Due for follow-up with Dr. Court on July 8. - Continue cilostazol  and Plavix . - Encourage ambulation to enhance circulation, with rest for pain relief. -Potentially consider invasive evaluation which would potentially be PTA only. - Continue GDMT meds for CAD.

## 2023-08-27 ENCOUNTER — Encounter: Payer: Self-pay | Admitting: Family

## 2023-08-31 ENCOUNTER — Encounter: Payer: Self-pay | Admitting: Family

## 2023-09-01 ENCOUNTER — Ambulatory Visit: Attending: Cardiovascular Disease | Admitting: Cardiovascular Disease

## 2023-09-01 ENCOUNTER — Encounter: Payer: Self-pay | Admitting: Cardiovascular Disease

## 2023-09-01 VITALS — BP 124/70 | HR 90 | Ht 65.0 in | Wt 265.2 lb

## 2023-09-01 DIAGNOSIS — I739 Peripheral vascular disease, unspecified: Secondary | ICD-10-CM

## 2023-09-01 MED ORDER — PREDNISONE 50 MG PO TABS: ORAL_TABLET | ORAL | 0 refills | Status: DC

## 2023-09-01 NOTE — Progress Notes (Signed)
 09/01/2023 Katrina Vega   1955-04-15  [id number]  Primary Physician Antonio Cyndee Jamee JONELLE, DO Primary Cardiologist: Dorn JINNY Lesches MD [facility], [place], [place]  HPI:  Katrina Vega is a 68 y.o.   68 year old moderately overweight single African-American female mother of 5, grandmother of 9 grandchildren referred by Dr. Anner for evaluation of PAD.  She is retired from Pharmacist, community at [employer].  Her risk factors include remote tobacco abuse, treated hypertension, diabetes and hyperlipidemia.  She has never had a stroke but did have coronary stenting in [place] in 2008 and apparently had a heart heart attack in 2015.  She had stenting of her right leg in 2008 and I think her left leg in 2019.  She does complain of dyspnea.  Over the last 3 months she has noticed increasing lower extremity claudication left greater than right.  She had Dopplers that revealed stable ABIs compared to 3 years ago with moderate disease in her proximal right SFA and a high-grade lesion in her left popliteal artery.  Since I saw her 2 months ago I did begin her on Pletal  which really has not changed her symptoms significantly.  She still has left greater than right lower extremity lifestyle-limiting claudication and wishes to proceed with angiography potential endovascular therapy.  Of note, she does have a contrast allergy .   Current Meds  Medication Sig   BREZTRI  AEROSPHERE 160-9-4.8 MCG/ACT AERO inhaler INHALE 2 INHALATIONS BY MOUTH  TWICE DAILY TO PREVENT COUGH OR  WHEEZE. RINSE, GARGLE, AND SPIT  AFTER USE   budesonide  (PULMICORT ) 0.5 MG/2ML nebulizer solution Take 2 mLs (0.5 mg total) by nebulization in the morning and at bedtime. During respiratory infections for 1-2 weeks at a time.   bumetanide  (BUMEX ) 1 MG tablet TAKE 1 TABLET BY MOUTH DAILY   Cholecalciferol 50 MCG (2000 UT) TABS Take 2,000 Units by mouth daily.   cilostazol  (PLETAL ) 50 MG tablet Take 1 tablet (50 mg total) by mouth  2 (two) times daily.   clopidogrel  (PLAVIX ) 75 MG tablet TAKE 1 TABLET BY MOUTH DAILY   Continuous Blood Gluc Sensor (DEXCOM G6 SENSOR) MISC USE 1 SENSOR AS NEEDED CHANGE EVERY 10 DAYS   Continuous Blood Gluc Transmit (DEXCOM G6 TRANSMITTER) MISC    Continuous Glucose Sensor (DEXCOM G6 SENSOR) MISC 1 Device by Does not apply route continuous.   diazepam  (VALIUM ) 5 MG tablet Take 1 tablet (5 mg total) by mouth every 12 (twelve) hours as needed for anxiety.   EPINEPHrine  (EPIPEN  2-PAK) 0.3 mg/0.3 mL IJ SOAJ injection Use as directed for severe allergic reactions   FASENRA  PEN 30 MG/ML prefilled autoinjector INJECT 30MG  SUBCUTANEOUSLY EVERY 8 WEEKS   gabapentin  (NEURONTIN ) 300 MG capsule Take 1 capsule (300 mg total) by mouth 3 (three) times daily.   Glucagon  (BAQSIMI  ONE PACK) 3 MG/DOSE POWD Place 1 Device into the nose as needed (Low blood sugar with impaired consciousness).   glucose 4 GM chewable tablet Chew 1 tablet by mouth once as needed for low blood sugar.   HUMALOG  100 UNIT/ML injection Inject up too 200 units into pump daily.   Insulin  Disposable Pump (OMNIPOD 5 DEXG7G6 PODS GEN 5) MISC 1 Device by Does not apply route every other day.   insulin  glargine (LANTUS  SOLOSTAR) 100 UNIT/ML Solostar Pen Inject 55 Units into the skin daily.   insulin  lispro (HUMALOG  KWIKPEN) 100 UNIT/ML KwikPen Based on carb and correction ratio, max dose 80 units a day  Insulin  Pen Needle (PEN NEEDLES) 32G X 4 MM MISC 1 Device by Does not apply route in the morning, at noon, in the evening, and at bedtime.   isosorbide  mononitrate (IMDUR ) 60 MG 24 hr tablet TAKE 1 TABLET BY MOUTH DAILY   levalbuterol  (XOPENEX  HFA) 45 MCG/ACT inhaler USE 2 INHALATIONS BY MOUTH EVERY 4 TO 6 HOURS AS NEEDED FOR  SHORTNESS OF BREATH , COUGH,  WHEEZE AND TIGHTNESS IN CHEST   levalbuterol  (XOPENEX ) 0.63 MG/3ML nebulizer solution Take 3 mLs (0.63 mg total) by nebulization every 4 (four) hours as needed for wheezing or shortness of  breath (coughing fits).   levocetirizine (XYZAL ) 5 MG tablet Take 1 tablet (5 mg total) by mouth every evening.   meclizine  (ANTIVERT ) 25 MG tablet Take 1 tablet (25 mg total) by mouth 3 (three) times daily as needed for dizziness.   methocarbamol  (ROBAXIN ) 500 MG tablet Take 1 tablet (500 mg total) by mouth every 6 (six) hours as needed for muscle spasms.   metoprolol  tartrate (LOPRESSOR ) 50 MG tablet TAKE 1 TABLET BY MOUTH EVERY 12  HOURS PT NEEDS TO KEEP UPCOMING  APPOINTMENT IN MAR TO AVOID ANY  MISSED DOSES   montelukast  (SINGULAIR ) 10 MG tablet TAKE 1 TABLET BY MOUTH AT  BEDTIME   nitroGLYCERIN  (NITROSTAT ) 0.4 MG SL tablet Place 1 tablet (0.4 mg total) under the tongue every 5 (five) minutes as needed for chest pain.   pantoprazole  (PROTONIX ) 40 MG tablet TAKE 1 TABLET BY MOUTH IN THE  MORNING AND AT BEDTIME   potassium chloride  (KLOR-CON ) 10 MEQ tablet Take 1 tablet (10 mEq total) by mouth daily.   Respiratory Therapy Supplies (CARETOUCH 2 CPAP HOSE HANGER) MISC by Does not apply route.   rosuvastatin  (CRESTOR ) 40 MG tablet TAKE 1 TABLET BY MOUTH AT  BEDTIME   tirzepatide  (MOUNJARO ) 7.5 MG/0.5ML Pen Inject 7.5 mg into the skin once a week.   [DISCONTINUED] empagliflozin  (JARDIANCE ) 10 MG TABS tablet Take 1 tablet (10 mg total) by mouth daily before breakfast.   Current Facility-Administered Medications for the 09/01/23 encounter (Office Visit) with Court Dorn PARAS, MD  Medication   Benralizumab  SOSY 30 mg     Allergies  Allergen Reactions   Contrast Media [Iodinated Contrast Media] Shortness Of Breath and Other (See Comments)    sob, chest tight 2024, small amount in joint injection tolerated well   Morphine Itching   Ioversol    Oxycodone  Other (See Comments)   Tramadol  Itching and Nausea Only    Social History   Socioeconomic History   Marital status: Single    Spouse name: Not on file   Number of children: 5   Years of education: Not on file   Highest education level:  Associate degree: academic program  Occupational History   Not on file  Tobacco Use   Smoking status: Former    Current packs/day: 0.00    Average packs/day: 0.5 packs/day for 15.0 years (7.5 ttl pk-yrs)    Types: Cigarettes    Start date: 10/13/1980    Quit date: 10/14/1995    Years since quitting: 27.9    Passive exposure: Current   Smokeless tobacco: Never  Vaping Use   Vaping status: Never Used  Substance and Sexual Activity   Alcohol use: Not Currently   Drug use: Not Currently   Sexual activity: Yes  Other Topics Concern   Not on file  Social History Narrative   Not on file   Social Drivers of Health  Financial Resource Strain: Low Risk  (08/15/2023)   Overall Financial Resource Strain (CARDIA)    Difficulty of Paying Living Expenses: Not very hard  Food Insecurity: No Food Insecurity (08/15/2023)   Hunger Vital Sign    Worried About Running Out of Food in the Last Year: Never true    Ran Out of Food in the Last Year: Never true  Transportation Needs: No Transportation Needs (08/15/2023)   PRAPARE - Administrator, Civil Service (Medical): No    Lack of Transportation (Non-Medical): No  Physical Activity: Insufficiently Active (08/15/2023)   Exercise Vital Sign    Days of Exercise per Week: 1 day    Minutes of Exercise per Session: 20 min  Stress: No Stress Concern Present (08/15/2023)   [employer] of Occupational Health - Occupational Stress Questionnaire    Feeling of Stress: Only a little  Social Connections: Moderately Integrated (08/15/2023)   Social Connection and Isolation Panel    Frequency of Communication with Friends and Family: More than three times a week    Frequency of Social Gatherings with Friends and Family: Once a week    Attends Religious Services: More than 4 times per year    Active Member of [employer] or Organizations: Yes    Attends Banker Meetings: More than 4 times per year    Marital Status: Never married   Intimate Partner Violence: Not At Risk (07/16/2021)   Humiliation, Afraid, Rape, and Kick questionnaire    Fear of Current or Ex-Partner: No    Emotionally Abused: No    Physically Abused: No    Sexually Abused: No     Review of Systems: General: negative for chills, fever, night sweats or weight changes.  Cardiovascular: negative for chest pain, dyspnea on exertion, edema, orthopnea, palpitations, paroxysmal nocturnal dyspnea or shortness of breath Dermatological: negative for rash Respiratory: negative for cough or wheezing Urologic: negative for hematuria Abdominal: negative for nausea, vomiting, diarrhea, bright red blood per rectum, melena, or hematemesis Neurologic: negative for visual changes, syncope, or dizziness All other systems reviewed and are otherwise negative except as noted above.    Blood pressure 124/70, pulse 90, height 5' 5 (1.651 m), weight 265 lb 3.2 oz (120.3 kg), SpO2 92%.  General appearance: alert and no distress Neck: no adenopathy, no carotid bruit, no JVD, supple, symmetrical, trachea midline, and thyroid  not enlarged, symmetric, no tenderness/mass/nodules Lungs: clear to auscultation bilaterally Heart: regular rate and rhythm, S1, S2 normal, no murmur, click, rub or gallop Extremities: extremities normal, atraumatic, no cyanosis or edema Pulses: 2+ and symmetric Skin: Skin color, texture, turgor normal. No rashes or lesions Neurologic: Grossly normal  EKG not performed today      ASSESSMENT AND PLAN:   Peripheral arterial disease (HCC) History of PAD status post bilateral lower extremity intervention in the past.  She apparently had a right posterior tibial DES placed 10/08 and left lower extremity intervention as well.  She has had lifestyle-limiting claudication left greater than right.  To start her on Pletal  which really does not affected her symptoms.  Dopplers performed/20/25 revealed a right ABI of 0.98 and left of 0.87.  She had mild  disease in her distal right SFA with a patent stent and high-grade disease in her left popliteal artery.  She wishes to proceed with endovascular therapy.  She does have a contrast allergy .     Dorn DOROTHA Lesches MD [facility], [facility] 09/01/2023 2:29 PM

## 2023-09-01 NOTE — Patient Instructions (Addendum)
 Medication Instructions:  Your physician has recommended you make the following change in your medication:   **Stop cilostozol (pletal )**  *If you need a refill on your cardiac medications before your next appointment, please call your pharmacy*  Lab Work: Your physician recommends that you return for lab work in: after July 14th- BMET & CBC  If you have labs (blood work) drawn today and your tests are completely normal, you will receive your results only by: [employer] (if you have MyChart) OR A paper copy in the mail If you have any lab test that is abnormal or we need to change your treatment, we will call you to review the results.  Testing/Procedures: Your physician has requested that you have a lower extremity arterial duplex. During this test, ultrasound is used to evaluate arterial blood flow in the legs. Allow one hour for this exam. There are no restrictions or special instructions. This will take place at [address] 4th floor **To do 1-2 weeks after your procedure (8/14)**  Please note: We ask at that you not bring children with you during ultrasound (echo/ vascular) testing. Due to room size and safety concerns, children are not allowed in the ultrasound rooms during exams. Our front office staff cannot provide observation of children in our lobby area while testing is being conducted. An adult accompanying a patient to their appointment will only be allowed in the ultrasound room at the discretion of the ultrasound technician under special circumstances. We apologize for any inconvenience.  Your physician has requested that you have an ankle brachial index (ABI). During this test an ultrasound and blood pressure cuff are used to evaluate the arteries that supply the arms and legs with blood. Allow thirty minutes for this exam. There are no restrictions or special instructions. This will take place at [address] 4th floor  **To do 1-2 weeks after your procedure  (8/14)**    Please note: We ask at that you not bring children with you during ultrasound (echo/ vascular) testing. Due to room size and safety concerns, children are not allowed in the ultrasound rooms during exams. Our front office staff cannot provide observation of children in our lobby area while testing is being conducted. An adult accompanying a patient to their appointment will only be allowed in the ultrasound room at the discretion of the ultrasound technician under special circumstances. We apologize for any inconvenience.   Follow-Up: At [facility], you and your health needs are our priority.  As part of our continuing mission to provide you with exceptional heart care, our providers are all part of one team.  This team includes your primary Cardiologist (physician) and Advanced Practice Providers or APPs (Physician Assistants and Nurse Practitioners) who all work together to provide you with the care you need, when you need it.  Your next appointment:   2-3 week(s) after your procedure (8/14)  Provider:   Dorn Lesches, MD    We recommend signing up for the patient portal called MyChart.  Sign up information is provided on this After Visit Summary.  MyChart is used to connect with patients for Virtual Visits (Telemedicine).  Patients are able to view lab/test results, encounter notes, upcoming appointments, etc.  Non-urgent messages can be sent to your provider as well.   To learn more about what you can do with MyChart, go to [email or web address].   Other Instructions       Cardiac/Peripheral Catheterization   You are scheduled for a Peripheral  Angiogram on Thursday, August 14 with Dr. Dorn Lesches.  1. Please arrive at the [facility] (Main Entrance A) at [facility]: [address] [place], [place] [address] at 5:30 AM (This time is 2 hour(s) before your procedure to ensure your preparation).   Free valet parking service is available. You  will check in at ADMITTING. The support person will be asked to wait in the waiting room.  It is OK to have someone drop you off and come back when you are ready to be discharged.        Special note: Every effort is made to have your procedure done on time. Please understand that emergencies sometimes delay scheduled procedures.  2. Diet: Do not eat solid foods after midnight.  You may have clear liquids until 5 AM the day of the procedure.  3. Labs: You will need to have blood drawn after July 14th   4. Medication instructions in preparation for your procedure:   Contrast Allergy : Yes, Please take Prednisone  50mg  by mouth at: Thirteen hours prior to cath 6:00pm on Wednesday Seven hours prior to cath 1:00am on Thursday And prior to leaving home please take last dose of Prednisone  50mg  and Benadryl 50mg  by mouth.   Stop taking, Bumex  (Bumetanide ) Thursday, August 14,   Hold mounjaro  on Sunday, August 10th    Hold humalog  on the day of your procedure.  On the morning of your procedure, take Aspirin 81 mg and Plavix /Clopidogrel  and any morning medicines NOT listed above.  You may use sips of water.  5. Plan to go home the same day, you will only stay overnight if medically necessary. 6. You MUST have a responsible adult to drive you home. 7. An adult MUST be with you the first 24 hours after you arrive home. 8. Bring a current list of your medications, and the last time and date medication taken. 9. Bring ID and current insurance cards. 10.Please wear clothes that are easy to get on and off and wear slip-on shoes.  Thank you for allowing us  to care for you!   -- [facility]

## 2023-09-01 NOTE — Assessment & Plan Note (Signed)
 History of PAD status post bilateral lower extremity intervention in the past.  She apparently had a right posterior tibial DES placed 10/08 and left lower extremity intervention as well.  She has had lifestyle-limiting claudication left greater than right.  To start her on Pletal  which really does not affected her symptoms.  Dopplers performed/20/25 revealed a right ABI of 0.98 and left of 0.87.  She had mild disease in her distal right SFA with a patent stent and high-grade disease in her left popliteal artery.  She wishes to proceed with endovascular therapy.  She does have a contrast allergy .

## 2023-09-02 ENCOUNTER — Encounter: Payer: Self-pay | Admitting: "Endocrinology

## 2023-09-04 ENCOUNTER — Ambulatory Visit: Admitting: Sports Medicine

## 2023-09-04 ENCOUNTER — Other Ambulatory Visit: Payer: Self-pay

## 2023-09-04 ENCOUNTER — Encounter: Payer: Self-pay | Admitting: Sports Medicine

## 2023-09-04 DIAGNOSIS — M25562 Pain in left knee: Secondary | ICD-10-CM | POA: Diagnosis not present

## 2023-09-04 DIAGNOSIS — N1832 Chronic kidney disease, stage 3b: Secondary | ICD-10-CM | POA: Diagnosis not present

## 2023-09-04 DIAGNOSIS — Z794 Long term (current) use of insulin: Secondary | ICD-10-CM | POA: Diagnosis not present

## 2023-09-04 DIAGNOSIS — G8929 Other chronic pain: Secondary | ICD-10-CM

## 2023-09-04 DIAGNOSIS — I739 Peripheral vascular disease, unspecified: Secondary | ICD-10-CM | POA: Diagnosis not present

## 2023-09-04 DIAGNOSIS — M19012 Primary osteoarthritis, left shoulder: Secondary | ICD-10-CM | POA: Diagnosis not present

## 2023-09-04 DIAGNOSIS — E1122 Type 2 diabetes mellitus with diabetic chronic kidney disease: Secondary | ICD-10-CM

## 2023-09-04 DIAGNOSIS — M25512 Pain in left shoulder: Secondary | ICD-10-CM | POA: Diagnosis not present

## 2023-09-04 MED ORDER — METHYLPREDNISOLONE ACETATE 40 MG/ML IJ SUSP
40.0000 mg | INTRAMUSCULAR | Status: AC | PRN
  Administered 2023-09-04: 40 mg via INTRA_ARTICULAR

## 2023-09-04 MED ORDER — LIDOCAINE HCL 1 % IJ SOLN
2.0000 mL | INTRAMUSCULAR | Status: AC | PRN
  Administered 2023-09-04: 2 mL

## 2023-09-04 MED ORDER — BUPIVACAINE HCL 0.25 % IJ SOLN
2.0000 mL | INTRAMUSCULAR | Status: AC | PRN
  Administered 2023-09-04: 2 mL via INTRA_ARTICULAR
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
## 2023-09-09 ENCOUNTER — Ambulatory Visit: Admitting: Podiatry

## 2023-09-10 ENCOUNTER — Other Ambulatory Visit: Payer: Self-pay | Admitting: Cardiology

## 2023-09-11 ENCOUNTER — Other Ambulatory Visit: Payer: Self-pay

## 2023-09-11 DIAGNOSIS — R0609 Other forms of dyspnea: Secondary | ICD-10-CM

## 2023-09-11 DIAGNOSIS — I5032 Chronic diastolic (congestive) heart failure: Secondary | ICD-10-CM

## 2023-09-11 MED ORDER — BUMETANIDE 1 MG PO TABS: 1.0000 mg | ORAL_TABLET | Freq: Every day | ORAL | 3 refills | Status: AC

## 2023-09-12 ENCOUNTER — Other Ambulatory Visit: Payer: Self-pay | Admitting: Family Medicine

## 2023-09-12 DIAGNOSIS — K449 Diaphragmatic hernia without obstruction or gangrene: Secondary | ICD-10-CM

## 2023-09-16 ENCOUNTER — Ambulatory Visit

## 2023-09-16 ENCOUNTER — Ambulatory Visit: Admitting: Podiatry

## 2023-09-17 ENCOUNTER — Encounter: Payer: Self-pay | Admitting: Rehabilitative and Restorative Service Providers"

## 2023-09-17 ENCOUNTER — Ambulatory Visit: Admitting: Rehabilitative and Restorative Service Providers"

## 2023-09-17 DIAGNOSIS — R293 Abnormal posture: Secondary | ICD-10-CM | POA: Diagnosis not present

## 2023-09-17 DIAGNOSIS — M6281 Muscle weakness (generalized): Secondary | ICD-10-CM

## 2023-09-17 DIAGNOSIS — M25612 Stiffness of left shoulder, not elsewhere classified: Secondary | ICD-10-CM

## 2023-09-17 DIAGNOSIS — M25512 Pain in left shoulder: Secondary | ICD-10-CM
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
## 2023-09-22 ENCOUNTER — Encounter: Payer: Self-pay | Admitting: Family

## 2023-09-23 ENCOUNTER — Ambulatory Visit: Admitting: Cardiovascular Disease

## 2023-09-29 ENCOUNTER — Encounter: Payer: Self-pay | Admitting: Rehabilitative and Restorative Service Providers"

## 2023-09-29 ENCOUNTER — Inpatient Hospital Stay: Attending: Hematology & Oncology

## 2023-09-29 ENCOUNTER — Inpatient Hospital Stay (HOSPITAL_BASED_OUTPATIENT_CLINIC_OR_DEPARTMENT_OTHER): Payer: Self-pay | Admitting: Family

## 2023-09-29 ENCOUNTER — Telehealth: Payer: Self-pay | Admitting: Rehabilitative and Restorative Service Providers"

## 2023-09-29 VITALS — BP 117/51 | HR 80 | Temp 98.5°F | Resp 19 | Ht 65.0 in | Wt 266.0 lb

## 2023-09-29 DIAGNOSIS — D509 Iron deficiency anemia, unspecified: Secondary | ICD-10-CM

## 2023-09-29 LAB — CBC WITH DIFFERENTIAL (CANCER CENTER ONLY)
Abs Immature Granulocytes: 0.04 K/uL (ref 0.00–0.07)
Basophils Absolute: 0 K/uL (ref 0.0–0.1)
Basophils Relative: 0 %
Eosinophils Absolute: 0 K/uL (ref 0.0–0.5)
Eosinophils Relative: 0 %
HCT: 41.3 % (ref 36.0–46.0)
Hemoglobin: 12.9 g/dL (ref 12.0–15.0)
Immature Granulocytes: 1 %
Lymphocytes Relative: 54 %
Lymphs Abs: 2.6 K/uL (ref 0.7–4.0)
MCH: 25.6 pg — ABNORMAL LOW (ref 26.0–34.0)
MCHC: 31.2 g/dL (ref 30.0–36.0)
MCV: 81.9 fL (ref 80.0–100.0)
Monocytes Absolute: 0.6 K/uL (ref 0.1–1.0)
Monocytes Relative: 12 %
Neutro Abs: 1.6 K/uL — ABNORMAL LOW (ref 1.7–7.7)
Neutrophils Relative %: 33 %
Platelet Count: 292 K/uL (ref 150–400)
RBC: 5.04 MIL/uL (ref 3.87–5.11)
RDW: 16.2 % — ABNORMAL HIGH (ref 11.5–15.5)
WBC Count: 4.9 K/uL (ref 4.0–10.5)
nRBC: 0 % (ref 0.0–0.2)

## 2023-09-29 LAB — IRON AND IRON BINDING CAPACITY (CC-WL,HP ONLY)
Iron: 76 ug/dL (ref 28–170)
Saturation Ratios: 17 % (ref 10.4–31.8)
TIBC: 455 ug/dL — ABNORMAL HIGH (ref 250–450)
UIBC: 379 ug/dL

## 2023-09-29 LAB — FERRITIN: Ferritin: 38 ng/mL (ref 11–307)

## 2023-09-29 LAB — RETICULOCYTES
Immature Retic Fract: 18.3 % — ABNORMAL HIGH (ref 2.3–15.9)
RBC.: 5.12 MIL/uL — ABNORMAL HIGH (ref 3.87–5.11)
Retic Count, Absolute: 89.1 K/uL (ref 19.0–186.0)
Retic Ct Pct: 1.7 % (ref 0.4–3.1)

## 2023-09-29 NOTE — Progress Notes (Signed)
 Hematology and Oncology Follow Up Visit  Katrina Vega [id number] Nov 25, 1955 68 y.o. 09/29/2023   Principle Diagnosis:  Iron  deficiency anemia - unknown cause   Current Therapy:        IV iron  as indicated    Interim History:  Katrina Vega is here today for follow-up. She is feeling fatigued.  She has not noted any blood loss. No bruising or petechiae.  No fever, chills, n/v, cough, rash, dizziness, SOB, chest pain, palpitations, abdominal pain or changes in bowel or bladder habits at this time.  She states that she has blockages in both legs with PAD which has been painful and is scheduled for lower extremity angiography with Dr. DOROTHA Lesches on 10/08/2023.  Numbness and tingling in her hands and toes comes and goes with arthritis.  No falls or syncope reported.  Appetite and hydration are good. Weight is stable at 166 lbs.   ECOG Performance Status: 1 - Symptomatic but completely ambulatory  Medications:  Allergies as of 09/29/2023       Reactions   Contrast Media [iodinated Contrast Media] Shortness Of Breath, Other (See Comments)   sob, chest tight 2024, small amount in joint injection tolerated well   Morphine Itching   Ioversol    Oxycodone  Other (See Comments)   Tramadol  Itching, Nausea Only        Medication List        Accurate as of September 29, 2023  2:45 PM. If you have any questions, ask your nurse or doctor.          Baqsimi  One Pack 3 MG/DOSE Powd Generic drug: Glucagon  Place 1 Device into the nose as needed (Low blood sugar with impaired consciousness).   Breztri  Aerosphere 160-9-4.8 MCG/ACT Aero inhaler Generic drug: budesonide -glycopyrrolate-formoterol  INHALE 2 INHALATIONS BY MOUTH  TWICE DAILY TO PREVENT COUGH OR  WHEEZE. RINSE, GARGLE, AND SPIT  AFTER USE   budesonide  0.5 MG/2ML nebulizer solution Commonly known as: Pulmicort  Take 2 mLs (0.5 mg total) by nebulization in the morning and at bedtime. During respiratory infections for 1-2 weeks at a  time.   bumetanide  1 MG tablet Commonly known as: BUMEX  Take 1 tablet (1 mg total) by mouth daily.   CareTouch 2 CPAP Hose Hanger Misc by Does not apply route.   Cholecalciferol 50 MCG (2000 UT) Tabs Take 2,000 Units by mouth daily.   clopidogrel  75 MG tablet Commonly known as: PLAVIX  TAKE 1 TABLET BY MOUTH DAILY   Dexcom G6 Sensor Misc USE 1 SENSOR AS NEEDED CHANGE EVERY 10 DAYS   Dexcom G6 Sensor Misc 1 Device by Does not apply route continuous.   Dexcom G6 Transmitter Misc   diazepam  5 MG tablet Commonly known as: VALIUM  Take 1 tablet (5 mg total) by mouth every 12 (twelve) hours as needed for anxiety.   EPINEPHrine  0.3 mg/0.3 mL Soaj injection Commonly known as: EpiPen  2-Pak Use as directed for severe allergic reactions   Fasenra  Pen 30 MG/ML prefilled autoinjector Generic drug: benralizumab  INJECT 30MG  SUBCUTANEOUSLY EVERY 8 WEEKS   gabapentin  300 MG capsule Commonly known as: NEURONTIN  Take 1 capsule (300 mg total) by mouth 3 (three) times daily.   glucose 4 GM chewable tablet Chew 1 tablet by mouth once as needed for low blood sugar.   HumaLOG  100 UNIT/ML injection Generic drug: insulin  lispro Inject up too 200 units into pump daily.   insulin  lispro 100 UNIT/ML KwikPen Commonly known as: HumaLOG  KwikPen Based on carb and correction ratio, max dose 80  units a day   isosorbide  mononitrate 60 MG 24 hr tablet Commonly known as: IMDUR  TAKE 1 TABLET BY MOUTH DAILY   Lantus  SoloStar 100 UNIT/ML Solostar Pen Generic drug: insulin  glargine Inject 55 Units into the skin daily.   levalbuterol  0.63 MG/3ML nebulizer solution Commonly known as: Xopenex  Take 3 mLs (0.63 mg total) by nebulization every 4 (four) hours as needed for wheezing or shortness of breath (coughing fits).   levalbuterol  45 MCG/ACT inhaler Commonly known as: XOPENEX  HFA USE 2 INHALATIONS BY MOUTH EVERY 4 TO 6 HOURS AS NEEDED FOR  SHORTNESS OF BREATH , COUGH,  WHEEZE AND TIGHTNESS IN  CHEST   levocetirizine 5 MG tablet Commonly known as: XYZAL  Take 1 tablet (5 mg total) by mouth every evening.   meclizine  25 MG tablet Commonly known as: ANTIVERT  Take 1 tablet (25 mg total) by mouth 3 (three) times daily as needed for dizziness.   methocarbamol  500 MG tablet Commonly known as: ROBAXIN  Take 1 tablet (500 mg total) by mouth every 6 (six) hours as needed for muscle spasms.   metoprolol  tartrate 50 MG tablet Commonly known as: LOPRESSOR  TAKE 1 TABLET BY MOUTH EVERY 12  HOURS   montelukast  10 MG tablet Commonly known as: SINGULAIR  TAKE 1 TABLET BY MOUTH AT  BEDTIME   nitroGLYCERIN  0.4 MG SL tablet Commonly known as: NITROSTAT  Place 1 tablet (0.4 mg total) under the tongue every 5 (five) minutes as needed for chest pain.   Omnipod 5 DexG7G6 Pods Gen 5 Misc 1 Device by Does not apply route every other day.   pantoprazole  40 MG tablet Commonly known as: PROTONIX  Take 1 tablet (40 mg total) by mouth 2 (two) times daily before a meal.   Pen Needles 32G X 4 MM Misc 1 Device by Does not apply route in the morning, at noon, in the evening, and at bedtime.   potassium chloride  10 MEQ tablet Commonly known as: KLOR-CON  Take 1 tablet (10 mEq total) by mouth daily.   predniSONE  50 MG tablet Commonly known as: DELTASONE  Take 1 tablet 13 hours prior to procedure, 1 tablet 7 hours prior to procedure, and 1 tablet (with Benedryl 50 mg) prior to going to the hospital for procedure   rosuvastatin  40 MG tablet Commonly known as: CRESTOR  TAKE 1 TABLET BY MOUTH AT  BEDTIME   tirzepatide  7.5 MG/0.5ML Pen Commonly known as: MOUNJARO  Inject 7.5 mg into the skin once a week.        Allergies:  Allergies  Allergen Reactions   Contrast Media [Iodinated Contrast Media] Shortness Of Breath and Other (See Comments)    sob, chest tight 2024, small amount in joint injection tolerated well   Morphine Itching   Ioversol    Oxycodone  Other (See Comments)   Tramadol  Itching  and Nausea Only    Past Medical History, Surgical history, Social history, and Family History were reviewed and updated.  Review of Systems: All other 10 point review of systems is negative.   Physical Exam:  height is 5' 5 (1.651 m) and weight is 266 lb (120.7 kg). Her oral temperature is 98.5 F (36.9 C). Her blood pressure is 117/51 (abnormal) and her pulse is 80. Her respiration is 19 and oxygen saturation is 98%.   Wt Readings from Last 3 Encounters:  09/29/23 266 lb (120.7 kg)  09/01/23 265 lb 3.2 oz (120.3 kg)  08/18/23 268 lb (121.6 kg)    Ocular: Sclerae unicteric, pupils equal, round and reactive to light Ear-nose-throat:  Oropharynx clear, dentition fair Lymphatic: No cervical or supraclavicular adenopathy Lungs no rales or rhonchi, good excursion bilaterally Heart regular rate and rhythm, no murmur appreciated Abd soft, nontender, positive bowel sounds MSK no focal spinal tenderness, no joint edema Neuro: non-focal, well-oriented, appropriate affect Breasts: Deferred   Lab Results  Component Value Date   WBC 4.9 09/29/2023   HGB 12.9 09/29/2023   HCT 41.3 09/29/2023   MCV 81.9 09/29/2023   PLT 292 09/29/2023   Lab Results  Component Value Date   FERRITIN 33 05/04/2023   IRON  53 05/04/2023   TIBC 421 05/04/2023   UIBC 368 05/04/2023   IRONPCTSAT 13 05/04/2023   Lab Results  Component Value Date   RETICCTPCT 1.7 09/29/2023   RBC 5.04 09/29/2023   RBC 5.12 (H) 09/29/2023   No results found for: KPAFRELGTCHN, LAMBDASER, KAPLAMBRATIO No results found for: IGGSERUM, IGA, IGMSERUM No results found for: STEPHANY CARLOTA BENSON MARKEL EARLA JOANNIE DOC VICK, SPEI   Chemistry      Component Value Date/Time   NA 139 08/17/2023 1218   K 4.2 08/17/2023 1218   CL 104 08/17/2023 1218   CO2 27 08/17/2023 1218   BUN 12 08/17/2023 1218   CREATININE 1.00 08/17/2023 1218   CREATININE 1.02 (H) 05/04/2023 1432       Component Value Date/Time   CALCIUM  10.1 08/17/2023 1218   ALKPHOS 63 08/17/2023 1218   AST 13 08/17/2023 1218   AST 19 05/04/2023 1432   ALT 13 08/17/2023 1218   ALT 22 05/04/2023 1432   BILITOT 0.5 08/17/2023 1218   BILITOT 0.4 05/04/2023 1432       Impression and Plan: Katrina Vega is a very pleasant 68 yo African American female with long history of iron  deficiency anemia.  Iron  studies are pending. We will look into switching her back to Injectafer if possible.  Follow-up in 6 months.   Lauraine Pepper, NP 8/5/20252:45 PM

## 2023-09-29 NOTE — Progress Notes (Unsigned)
 Follow Up Note  RE: Katrina Vega MRN: [id number] DOB: November 25, 1955 Date of Office Visit: 09/30/2023  Referring provider: Antonio Vega Katrina Vega, * Primary care provider: Antonio Vega, Katrina JONELLE, DO  Chief Complaint: No chief complaint on file.  History of Present Illness: I had the pleasure of seeing Katrina Vega for a follow up visit at the [facility] on 09/30/2023. She is a 68 y.o. female, who is being followed for rhinitis, asthma/COPD overlap syndrome, GERD. Her previous allergy  office visit was on May 27, 2023 with Dr. Luke. Today is a regular follow up visit.  Discussed the use of AI scribe software for clinical note transcription with the patient, who gave verbal consent to proceed.  History of Present Illness            2025 labs: Your environmental panel was negative to indoor/outdoor allergens which is great.  Assessment and Plan: Katrina Vega is a 68 y.o. female with: Other allergic rhinitis Past history - started on AIT in [place] in 2021 [facility] Stopped AIT due to persistent coughing. Today's skin testing negative to indoor/outdoor allergens but the positive control was negative x 2 questioning the validity of the results.  Patient initially states that she didn't take any antihistamines but then thinks she may took levocetirizine.  Get bloodwork Use over the counter antihistamines such as Zyrtec (cetirizine), Claritin (loratadine), Allegra (fexofenadine), or Xyzal  (levocetirizine) daily as needed. May take twice a day during allergy  flares. May switch antihistamines every few months. Use Nasacort  (triamcinolone ) nasal spray 1 spray per nostril twice a day as needed for nasal congestion.  Use azelastine  nasal spray 1-2 sprays per nostril twice a day as needed for runny nose/drainage. Nasal saline spray (i.e., Simply Saline) or nasal saline lavage (i.e., NeilMed) is recommended as needed and prior to medicated nasal sprays. Use refresh eye drops  as needed.   Asthma-COPD overlap syndrome Past history - triggered by strong scents at work. Albuterol  caused shaking/palpitations.  Daily controller medication(s): Breztri  2 puffs twice a day with spacer and rinse mouth afterwards. Fasenra  injections every 8 weeks. Continue Singulair  (montelukast ) 10mg  daily at night. During respiratory infections/flares:  Start Pulmicort  (budesonide ) 0.5mg  nebulizer twice a day for 1-2 weeks until your breathing symptoms return to baseline.  Pretreat with levoalbuterol 2 puffs or levoalbuterol nebulizer.  If you need to use your levoalbuterol nebulizer machine back to back within 15-30 minutes with no relief then please go to the ER/urgent care for further evaluation.  May use levoalbuterol rescue inhaler 2 puffs or nebulizer every 4 to 6 hours as needed for shortness of breath, chest tightness, coughing, and wheezing. May use levoalbuterol rescue inhaler 2 puffs 5 to 15 minutes prior to strenuous physical activities. Monitor frequency of use - if you need to use it more than twice per week on a consistent basis let us  know.    Gastroesophageal reflux disease, unspecified whether esophagitis present Continue lifestyle and dietary modifications. Continue Protonix  40mg  twice day - nothing to eat or drink for 20-30 minutes afterwards.  Recommend GI evaluation.  Assessment and Plan              No follow-ups on file.  No orders of the defined types were placed in this encounter.  Lab Orders  No laboratory test(s) ordered today    Diagnostics: Spirometry:  Tracings reviewed. Her effort: {Blank single:19197::Good reproducible efforts.,It was hard to get consistent efforts and there is a question as to whether  this reflects a maximal maneuver.,Poor effort, data can not be interpreted.} FVC: ***L FEV1: ***L, ***% predicted FEV1/FVC ratio: ***% Interpretation: {Blank single:19197::Spirometry consistent with mild obstructive disease,Spirometry  consistent with moderate obstructive disease,Spirometry consistent with severe obstructive disease,Spirometry consistent with possible restrictive disease,Spirometry consistent with mixed obstructive and restrictive disease,Spirometry uninterpretable due to technique,Spirometry consistent with normal pattern,No overt abnormalities noted given today's efforts}.  Please see scanned spirometry results for details.  Skin Testing: {Blank single:19197::Select foods,Environmental allergy  panel,Environmental allergy  panel and select foods,Food allergy  panel,None,Deferred due to recent antihistamines use}. *** Results discussed with patient/family.   Medication List:  Current Outpatient Medications  Medication Sig Dispense Refill   BREZTRI  AEROSPHERE 160-9-4.8 MCG/ACT AERO inhaler INHALE 2 INHALATIONS BY MOUTH  TWICE DAILY TO PREVENT COUGH OR  WHEEZE. RINSE, GARGLE, AND SPIT  AFTER USE 32.1 g 3   budesonide  (PULMICORT ) 0.5 MG/2ML nebulizer solution Take 2 mLs (0.5 mg total) by nebulization in the morning and at bedtime. During respiratory infections for 1-2 weeks at a time. 120 mL 2   bumetanide  (BUMEX ) 1 MG tablet Take 1 tablet (1 mg total) by mouth daily. 100 tablet 3   Cholecalciferol 50 MCG (2000 UT) TABS Take 2,000 Units by mouth daily.     clopidogrel  (PLAVIX ) 75 MG tablet TAKE 1 TABLET BY MOUTH DAILY 90 tablet 3   Continuous Blood Gluc Sensor (DEXCOM G6 SENSOR) MISC USE 1 SENSOR AS NEEDED CHANGE EVERY 10 DAYS     Continuous Blood Gluc Transmit (DEXCOM G6 TRANSMITTER) MISC      Continuous Glucose Sensor (DEXCOM G6 SENSOR) MISC 1 Device by Does not apply route continuous. 9 each 3   diazepam  (VALIUM ) 5 MG tablet Take 1 tablet (5 mg total) by mouth every 12 (twelve) hours as needed for anxiety. 5 tablet 0   EPINEPHrine  (EPIPEN  2-PAK) 0.3 mg/0.3 mL IJ SOAJ injection Use as directed for severe allergic reactions 2 each 3   FASENRA  PEN 30 MG/ML prefilled autoinjector INJECT 30MG   SUBCUTANEOUSLY EVERY 8 WEEKS 1 mL 6   gabapentin  (NEURONTIN ) 300 MG capsule Take 1 capsule (300 mg total) by mouth 3 (three) times daily. 270 capsule 0   Glucagon  (BAQSIMI  ONE PACK) 3 MG/DOSE POWD Place 1 Device into the nose as needed (Low blood sugar with impaired consciousness). 2 each 3   glucose 4 GM chewable tablet Chew 1 tablet by mouth once as needed for low blood sugar.     HUMALOG  100 UNIT/ML injection Inject up too 200 units into pump daily. 200 mL 3   Insulin  Disposable Pump (OMNIPOD 5 DEXG7G6 PODS GEN 5) MISC 1 Device by Does not apply route every other day. 45 each 3   insulin  glargine (LANTUS  SOLOSTAR) 100 UNIT/ML Solostar Pen Inject 55 Units into the skin daily. 18 mL 2   insulin  lispro (HUMALOG  KWIKPEN) 100 UNIT/ML KwikPen Based on carb and correction ratio, max dose 80 units a day 18 mL 2   Insulin  Pen Needle (PEN NEEDLES) 32G X 4 MM MISC 1 Device by Does not apply route in the morning, at noon, in the evening, and at bedtime. 100 each 2   isosorbide  mononitrate (IMDUR ) 60 MG 24 hr tablet TAKE 1 TABLET BY MOUTH DAILY 90 tablet 3   levalbuterol  (XOPENEX  HFA) 45 MCG/ACT inhaler USE 2 INHALATIONS BY MOUTH EVERY 4 TO 6 HOURS AS NEEDED FOR  SHORTNESS OF BREATH , COUGH,  WHEEZE AND TIGHTNESS IN CHEST 45 g 6   levalbuterol  (XOPENEX ) 0.63 MG/3ML nebulizer solution Take 3  mLs (0.63 mg total) by nebulization every 4 (four) hours as needed for wheezing or shortness of breath (coughing fits). 75 mL 1   levocetirizine (XYZAL ) 5 MG tablet Take 1 tablet (5 mg total) by mouth every evening. 90 tablet 3   meclizine  (ANTIVERT ) 25 MG tablet Take 1 tablet (25 mg total) by mouth 3 (three) times daily as needed for dizziness. 30 tablet 0   methocarbamol  (ROBAXIN ) 500 MG tablet Take 1 tablet (500 mg total) by mouth every 6 (six) hours as needed for muscle spasms. 30 tablet 2   metoprolol  tartrate (LOPRESSOR ) 50 MG tablet TAKE 1 TABLET BY MOUTH EVERY 12  HOURS 180 tablet 3   montelukast  (SINGULAIR ) 10 MG  tablet TAKE 1 TABLET BY MOUTH AT  BEDTIME 100 tablet 1   nitroGLYCERIN  (NITROSTAT ) 0.4 MG SL tablet Place 1 tablet (0.4 mg total) under the tongue every 5 (five) minutes as needed for chest pain. 25 tablet 3   pantoprazole  (PROTONIX ) 40 MG tablet Take 1 tablet (40 mg total) by mouth 2 (two) times daily before a meal. 180 tablet 1   potassium chloride  (KLOR-CON ) 10 MEQ tablet Take 1 tablet (10 mEq total) by mouth daily. 90 tablet 3   predniSONE  (DELTASONE ) 50 MG tablet Take 1 tablet 13 hours prior to procedure, 1 tablet 7 hours prior to procedure, and 1 tablet (with Benedryl 50 mg) prior to going to the hospital for procedure 3 tablet 0   Respiratory Therapy Supplies (CARETOUCH 2 CPAP HOSE HANGER) MISC by Does not apply route.     rosuvastatin  (CRESTOR ) 40 MG tablet TAKE 1 TABLET BY MOUTH AT  BEDTIME 90 tablet 3   tirzepatide  (MOUNJARO ) 7.5 MG/0.5ML Pen Inject 7.5 mg into the skin once a week. 2 mL 0   Current Facility-Administered Medications  Medication Dose Route Frequency Provider Last Rate Last Admin   Benralizumab  SOSY 30 mg  30 mg Subcutaneous Q8 Weeks Dale, Christine, FNP   30 mg at 08/17/23 1608   Allergies: Allergies  Allergen Reactions   Contrast Media [Iodinated Contrast Media] Shortness Of Breath and Other (See Comments)    sob, chest tight 2024, small amount in joint injection tolerated well   Morphine Itching   Ioversol    Oxycodone  Other (See Comments)   Tramadol  Itching and Nausea Only   I reviewed her past medical history, social history, family history, and environmental history and no significant changes have been reported from her previous visit.  Review of Systems  Constitutional:  Negative for appetite change, chills, fever and unexpected weight change.  HENT:  Negative for congestion and rhinorrhea.   Eyes:  Negative for itching.  Respiratory:  Positive for shortness of breath. Negative for wheezing.   Cardiovascular:  Negative for chest pain.  Gastrointestinal:   Negative for abdominal pain.  Genitourinary:  Negative for difficulty urinating.  Skin:  Negative for rash.  Allergic/Immunologic: Negative for environmental allergies.  Neurological:  Negative for headaches.    Objective: There were no vitals taken for this visit. There is no height or weight on file to calculate BMI. Physical Exam Vitals and nursing note reviewed.  Constitutional:      Appearance: Normal appearance. She is well-developed.  HENT:     Head: Normocephalic and atraumatic.     Right Ear: Tympanic membrane and external ear normal.     Left Ear: Tympanic membrane and external ear normal.     Nose: Nose normal.     Mouth/Throat:  Mouth: Mucous membranes are moist.     Pharynx: Oropharynx is clear.  Eyes:     Conjunctiva/sclera: Conjunctivae normal.  Cardiovascular:     Rate and Rhythm: Normal rate and regular rhythm.     Heart sounds: Normal heart sounds. No murmur heard.    No friction rub. No gallop.  Pulmonary:     Effort: Pulmonary effort is normal.     Breath sounds: Normal breath sounds. No wheezing, rhonchi or rales.  Musculoskeletal:     Cervical back: Neck supple.     Comments: Right wrist in brace.  Skin:    General: Skin is warm.     Findings: No rash.  Neurological:     Mental Status: She is alert and oriented to person, place, and time.  Psychiatric:        Behavior: Behavior normal.    Previous notes and tests were reviewed. The plan was reviewed with the patient/family, and all questions/concerned were addressed.  It was my pleasure to see Katrina Vega today and participate in her care. Please feel free to contact me with any questions or concerns.  Sincerely,  Orlan Cramp, DO Allergy  & Immunology  [facility]  [place] office: [phone] [facility] office: [phone]

## 2023-09-29 NOTE — Telephone Encounter (Signed)
 Left VM reminding her of her appointment 8/11 at 3:15.  Left [phone] number to cancel or reschedule if needed.  No show today.

## 2023-09-30 ENCOUNTER — Ambulatory Visit: Admitting: Podiatry

## 2023-09-30 ENCOUNTER — Encounter: Payer: Self-pay | Admitting: Allergy

## 2023-09-30 ENCOUNTER — Other Ambulatory Visit: Payer: Self-pay

## 2023-09-30 ENCOUNTER — Ambulatory Visit: Admitting: Allergy

## 2023-09-30 VITALS — BP 100/64 | HR 82 | Temp 98.1°F | Wt 266.0 lb

## 2023-09-30 DIAGNOSIS — K219 Gastro-esophageal reflux disease without esophagitis: Secondary | ICD-10-CM

## 2023-09-30 DIAGNOSIS — J4489 Other specified chronic obstructive pulmonary disease: Secondary | ICD-10-CM

## 2023-09-30 DIAGNOSIS — E1149 Type 2 diabetes mellitus with other diabetic neurological complication: Secondary | ICD-10-CM

## 2023-09-30 DIAGNOSIS — E1140 Type 2 diabetes mellitus with diabetic neuropathy, unspecified: Secondary | ICD-10-CM | POA: Diagnosis not present

## 2023-09-30 DIAGNOSIS — M216X2 Other acquired deformities of left foot: Secondary | ICD-10-CM | POA: Diagnosis not present

## 2023-09-30 DIAGNOSIS — J310 Chronic rhinitis: Secondary | ICD-10-CM

## 2023-09-30 DIAGNOSIS — Z01810 Encounter for preprocedural cardiovascular examination: Secondary | ICD-10-CM | POA: Diagnosis not present

## 2023-09-30 DIAGNOSIS — M216X1 Other acquired deformities of right foot: Secondary | ICD-10-CM | POA: Diagnosis not present

## 2023-09-30 DIAGNOSIS — I739 Peripheral vascular disease, unspecified: Secondary | ICD-10-CM | POA: Diagnosis not present

## 2023-09-30 MED ORDER — MONTELUKAST SODIUM 10 MG PO TABS: 10.0000 mg | ORAL_TABLET | Freq: Every day | ORAL | 3 refills | Status: AC

## 2023-09-30 MED ORDER — IPRATROPIUM BROMIDE 0.03 % NA SOLN: 1.0000 | Freq: Two times a day (BID) | NASAL | 3 refills | Status: DC | PRN

## 2023-09-30 NOTE — Progress Notes (Signed)
 Subjective:  Patient ID: Katrina Vega, female    DOB: 27-Nov-1955,  MRN: [id number]  Chief Complaint  Patient presents with   Callouses    Pt stated that Dr Loreda told her she needed to see you for her feet     68 y.o. female presents with the above complaint.  Patient presents with bilateral plantarflexed third metatarsal painful to touch.  She states it hurts with ambulation hurts with pressure she has not seen MRIs prior to seeing me.  She does have a history of diabetes as well as vascular issues.  She is scheduled to see vascular surgery for intervention as well.  She wanted to get a consultation for surgical intervention of these bilateral plantarflexed third metatarsal.  Pain scale 7 out of 10 dull aching nature.    Review of Systems: Negative except as noted in the HPI. Denies N/V/F/Ch.  Past Medical History:  Diagnosis Date   Asthma    CAD S/P two-vessel DES PCI 2008   LAD and RI; 2013 PTCA of small RCA.   CHF (congestive heart failure), NYHA class I, chronic, diastolic (HCC) 2019   Recent Echo 12/16/2021: Mild concentric LVH.  EF 59%.  GI 1 DD.  Normal LAP-(Mildly dilated LA;; has converted from to torsemide  and now on bumetanide    CKD stage 3 due to type 2 diabetes mellitus (HCC)    COPD (chronic obstructive pulmonary disease) (HCC) 2021   Complicated by asthma and seasonal allergies.   Diabetes mellitus, type II, insulin  dependent (HCC) 2010   On insulin  60 units TID Premeal.  Also on Jardiance  and Ozempic.   Eczema    Essential hypertension    Heart attack [facility]) 2008   2008-two-vessel PCI; 2013 PTCA only small RCA   History of left hip replacement 04/2018   Hyperlipidemia associated with type 2 diabetes mellitus (HCC)    140 mg rosuvastatin    Insulin  pump in place    PAD (peripheral artery disease) (HCC)    Status post bilateral SFA stents and right posterior tibial DES stent   Primary hypertension 01/20/2011      Patient's blood pressure is well controlled.   Continue current medications.    Current Outpatient Medications:    BREZTRI  AEROSPHERE 160-9-4.8 MCG/ACT AERO inhaler, INHALE 2 INHALATIONS BY MOUTH  TWICE DAILY TO PREVENT COUGH OR  WHEEZE. RINSE, GARGLE, AND SPIT  AFTER USE, Disp: 32.1 g, Rfl: 3   budesonide  (PULMICORT ) 0.5 MG/2ML nebulizer solution, Take 2 mLs (0.5 mg total) by nebulization in the morning and at bedtime. During respiratory infections for 1-2 weeks at a time., Disp: 120 mL, Rfl: 2   bumetanide  (BUMEX ) 1 MG tablet, Take 1 tablet (1 mg total) by mouth daily., Disp: 100 tablet, Rfl: 3   Cholecalciferol 50 MCG (2000 UT) TABS, Take 2,000 Units by mouth daily., Disp: , Rfl:    clopidogrel  (PLAVIX ) 75 MG tablet, TAKE 1 TABLET BY MOUTH DAILY, Disp: 90 tablet, Rfl: 3   Continuous Blood Gluc Sensor (DEXCOM G6 SENSOR) MISC, USE 1 SENSOR AS NEEDED CHANGE EVERY 10 DAYS, Disp: , Rfl:    Continuous Blood Gluc Transmit (DEXCOM G6 TRANSMITTER) MISC, , Disp: , Rfl:    Continuous Glucose Sensor (DEXCOM G6 SENSOR) MISC, 1 Device by Does not apply route continuous., Disp: 9 each, Rfl: 3   diazepam  (VALIUM ) 5 MG tablet, Take 1 tablet (5 mg total) by mouth every 12 (twelve) hours as needed for anxiety., Disp: 5 tablet, Rfl: 0   EPINEPHrine  (EPIPEN   2-PAK) 0.3 mg/0.3 mL IJ SOAJ injection, Use as directed for severe allergic reactions (Patient taking differently: Inject 0.3 mg into the muscle as needed. Use as directed for severe allergic reactions), Disp: 2 each, Rfl: 3   FASENRA  PEN 30 MG/ML prefilled autoinjector, INJECT 30MG  SUBCUTANEOUSLY EVERY 8 WEEKS, Disp: 1 mL, Rfl: 6   gabapentin  (NEURONTIN ) 300 MG capsule, Take 1 capsule (300 mg total) by mouth 3 (three) times daily., Disp: 270 capsule, Rfl: 0   Glucagon  (BAQSIMI  ONE PACK) 3 MG/DOSE POWD, Place 1 Device into the nose as needed (Low blood sugar with impaired consciousness)., Disp: 2 each, Rfl: 3   glucose 4 GM chewable tablet, Chew 1 tablet by mouth once as needed for low blood sugar., Disp: ,  Rfl:    HUMALOG  100 UNIT/ML injection, Inject up too 200 units into pump daily., Disp: 200 mL, Rfl: 3   Insulin  Disposable Pump (OMNIPOD 5 DEXG7G6 PODS GEN 5) MISC, 1 Device by Does not apply route every other day., Disp: 45 each, Rfl: 3   insulin  glargine (LANTUS  SOLOSTAR) 100 UNIT/ML Solostar Pen, Inject 55 Units into the skin daily., Disp: 18 mL, Rfl: 2   insulin  lispro (HUMALOG  KWIKPEN) 100 UNIT/ML KwikPen, Based on carb and correction ratio, max dose 80 units a day, Disp: 18 mL, Rfl: 2   Insulin  Pen Needle (PEN NEEDLES) 32G X 4 MM MISC, 1 Device by Does not apply route in the morning, at noon, in the evening, and at bedtime., Disp: 100 each, Rfl: 2   isosorbide  mononitrate (IMDUR ) 60 MG 24 hr tablet, TAKE 1 TABLET BY MOUTH DAILY, Disp: 90 tablet, Rfl: 3   levalbuterol  (XOPENEX  HFA) 45 MCG/ACT inhaler, USE 2 INHALATIONS BY MOUTH EVERY 4 TO 6 HOURS AS NEEDED FOR  SHORTNESS OF BREATH , COUGH,  WHEEZE AND TIGHTNESS IN CHEST, Disp: 45 g, Rfl: 6   levalbuterol  (XOPENEX ) 0.63 MG/3ML nebulizer solution, Take 3 mLs (0.63 mg total) by nebulization every 4 (four) hours as needed for wheezing or shortness of breath (coughing fits)., Disp: 75 mL, Rfl: 1   levocetirizine (XYZAL ) 5 MG tablet, Take 1 tablet (5 mg total) by mouth every evening., Disp: 90 tablet, Rfl: 3   meclizine  (ANTIVERT ) 25 MG tablet, Take 1 tablet (25 mg total) by mouth 3 (three) times daily as needed for dizziness., Disp: 30 tablet, Rfl: 0   methocarbamol  (ROBAXIN ) 500 MG tablet, Take 1 tablet (500 mg total) by mouth every 6 (six) hours as needed for muscle spasms., Disp: 30 tablet, Rfl: 2   metoprolol  tartrate (LOPRESSOR ) 50 MG tablet, TAKE 1 TABLET BY MOUTH EVERY 12  HOURS (Patient not taking: Reported on 09/30/2023), Disp: 180 tablet, Rfl: 3   montelukast  (SINGULAIR ) 10 MG tablet, TAKE 1 TABLET BY MOUTH AT  BEDTIME, Disp: 100 tablet, Rfl: 1   nitroGLYCERIN  (NITROSTAT ) 0.4 MG SL tablet, Place 1 tablet (0.4 mg total) under the tongue every 5  (five) minutes as needed for chest pain., Disp: 25 tablet, Rfl: 3   pantoprazole  (PROTONIX ) 40 MG tablet, Take 1 tablet (40 mg total) by mouth 2 (two) times daily before a meal., Disp: 180 tablet, Rfl: 1   potassium chloride  (KLOR-CON ) 10 MEQ tablet, Take 1 tablet (10 mEq total) by mouth daily., Disp: 90 tablet, Rfl: 3   predniSONE  (DELTASONE ) 50 MG tablet, Take 1 tablet 13 hours prior to procedure, 1 tablet 7 hours prior to procedure, and 1 tablet (with Benedryl 50 mg) prior to going to the hospital for procedure,  Disp: 3 tablet, Rfl: 0   Respiratory Therapy Supplies (CARETOUCH 2 CPAP HOSE HANGER) MISC, by Does not apply route., Disp: , Rfl:    rosuvastatin  (CRESTOR ) 40 MG tablet, TAKE 1 TABLET BY MOUTH AT  BEDTIME, Disp: 90 tablet, Rfl: 3   tirzepatide  (MOUNJARO ) 7.5 MG/0.5ML Pen, Inject 7.5 mg into the skin once a week., Disp: 2 mL, Rfl: 0  Current Facility-Administered Medications:    Benralizumab  SOSY 30 mg, 30 mg, Subcutaneous, Q8 Weeks, Dale, Christine, FNP, 30 mg at 08/17/23 1608  Social History   Tobacco Use  Smoking Status Former   Current packs/day: 0.00   Average packs/day: 0.5 packs/day for 15.0 years (7.5 ttl pk-yrs)   Types: Cigarettes   Start date: 10/13/1980   Quit date: 10/14/1995   Years since quitting: 27.9   Passive exposure: Current  Smokeless Tobacco Never    Allergies  Allergen Reactions   Contrast Media [Iodinated Contrast Media] Shortness Of Breath and Other (See Comments)    sob, chest tight 2024, small amount in joint injection tolerated well   Morphine Itching   Ioversol    Oxycodone  Other (See Comments)   Tramadol  Itching and Nausea Only   Objective:  There were no vitals filed for this visit. There is no height or weight on file to calculate BMI. Constitutional Well developed. Well nourished.  Vascular Dorsalis pedis pulses palpable bilaterally. Posterior tibial pulses palpable bilaterally. Capillary refill normal to all digits.  No cyanosis or  clubbing noted. Pedal hair growth normal.  Neurologic Normal speech. Oriented to person, place, and time. Epicritic sensation to light touch grossly present bilaterally.  Dermatologic Bilateral plantarflexed third metatarsal noted with submetatarsal 5 skin lesion.  Pain on palpation to the lesion.  No open wounds or lesion noted  Orthopedic: Normal joint ROM without pain or crepitus bilaterally. No visible deformities. No bony tenderness.   Radiographs: None Assessment:   1. Plantar flexed metatarsal bone of right foot   2. Plantar flexed metatarsal bone of left foot   3. Diabetic neuropathy with neurologic complication [facility])    Plan:  Patient was evaluated and treated and all questions answered.  Bilateral plantarflexed third metatarsal with history of peripheral vascular disease and diabetes - All questions and concerns were discussed with the patient today in extensive detail.  Given the amount of vascular disease is present she will need clearance from vascular surgery to undergo this procedure she is scheduled to see them next week for stenting procedure as well.  Will continue to monitor the patient.She will also need her A1c below 8% currently at 8.2 - In the future we will plan on doing bilateral third metatarsal floating osteotomy.

## 2023-09-30 NOTE — Patient Instructions (Addendum)
 Rhinitis  Stop levocetirizine and monitor symptoms.  Use Nasacort  (triamcinolone ) nasal spray 1 spray per nostril twice a day as needed for nasal congestion.  Use Atrovent  (ipratropium) 0.03% 1-2 sprays per nostril twice a day as needed for runny nose/drainage. Stop azelastine  nasal spray.  Nasal saline spray (i.e., Simply Saline) or nasal saline lavage (i.e., NeilMed) is recommended as needed and prior to medicated nasal sprays. Use refresh eye drops as needed.  Asthma Make sure you take your Breztri  and nebulizer the morning of your procedure.  Daily controller medication(s): Breztri  2 puffs twice a day with spacer and rinse mouth afterwards. Fasenra  injections every 8 weeks. Continue Singulair  (montelukast ) 10mg  daily at night. During respiratory infections/flares:  Start Pulmicort  (budesonide ) 0.5mg  nebulizer twice a day for 1-2 weeks until your breathing symptoms return to baseline.  Pretreat with levoalbuterol 2 puffs or levoalbuterol nebulizer.  If you need to use your levoalbuterol nebulizer machine back to back within 15-30 minutes with no relief then please go to the ER/urgent care for further evaluation.  May use levoalbuterol rescue inhaler 2 puffs or nebulizer every 4 to 6 hours as needed for shortness of breath, chest tightness, coughing, and wheezing. May use levoalbuterol rescue inhaler 2 puffs 5 to 15 minutes prior to strenuous physical activities. Monitor frequency of use - if you need to use it more than twice per week on a consistent basis let us  know.  Breathing control goals:  Full participation in all desired activities (may need albuterol  before activity) Albuterol  use two times or less a week on average (not counting use with activity) Cough interfering with sleep two times or less a month Oral steroids no more than once a year No hospitalizations   Reflux Continue lifestyle and dietary modifications. Continue Protonix  40mg  twice day - nothing to eat or drink for  20-30 minutes afterwards.  Recommend GI evaluation.    Return in about 4 months (around 01/30/2024). Or sooner if needed.

## 2023-10-01 ENCOUNTER — Ambulatory Visit: Payer: Self-pay | Admitting: Cardiovascular Disease

## 2023-10-01 LAB — CBC WITH DIFFERENTIAL/PLATELET
Basophils Absolute: 0 x10E3/uL (ref 0.0–0.2)
Basos: 1 %
EOS (ABSOLUTE): 0 x10E3/uL (ref 0.0–0.4)
Eos: 0 %
Hematocrit: 40.3 % (ref 34.0–46.6)
Hemoglobin: 12.6 g/dL (ref 11.1–15.9)
Immature Grans (Abs): 0 x10E3/uL (ref 0.0–0.1)
Immature Granulocytes: 0 %
Lymphocytes Absolute: 2.3 x10E3/uL (ref 0.7–3.1)
Lymphs: 48 %
MCH: 25.9 pg — ABNORMAL LOW (ref 26.6–33.0)
MCHC: 31.3 g/dL — ABNORMAL LOW (ref 31.5–35.7)
MCV: 83 fL (ref 79–97)
Monocytes Absolute: 0.4 x10E3/uL (ref 0.1–0.9)
Monocytes: 8 %
Neutrophils Absolute: 2.1 x10E3/uL (ref 1.4–7.0)
Neutrophils: 43 %
Platelets: 291 x10E3/uL (ref 150–450)
RBC: 4.87 x10E6/uL (ref 3.77–5.28)
RDW: 15.5 % — ABNORMAL HIGH (ref 11.7–15.4)
WBC: 4.8 x10E3/uL (ref 3.4–10.8)

## 2023-10-01 LAB — BASIC METABOLIC PANEL WITH GFR
BUN/Creatinine Ratio: 9 — AB (ref 12–28)
BUN: 12 mg/dL (ref 8–27)
CO2: 20 mmol/L (ref 20–29)
Calcium: 10 mg/dL (ref 8.7–10.3)
Chloride: 102 mmol/L (ref 96–106)
Creatinine, Ser: 1.28 mg/dL — AB (ref 0.57–1.00)
Glucose: 153 mg/dL — AB (ref 70–99)
Potassium: 4.4 mmol/L (ref 3.5–5.2)
Sodium: 139 mmol/L (ref 134–144)
eGFR: 45 mL/min/1.73 — AB (ref >59)

## 2023-10-02 ENCOUNTER — Other Ambulatory Visit: Payer: Self-pay | Admitting: Cardiovascular Disease

## 2023-10-02 DIAGNOSIS — I739 Peripheral vascular disease, unspecified: Secondary | ICD-10-CM

## 2023-10-04 NOTE — H&P (Signed)
 10/05/23 Katrina Vega   [birth year]  [id number]   Primary Physician Katrina Cyndee Jamee JONELLE, DO Primary Cardiologist: Katrina Vega Katrina Lesches MD Katrina Vega, [place], [place]   HPI:  Katrina Vega is a 68 y.o.   68 year old moderately overweight single African-American female mother of 5, grandmother of 9 grandchildren referred by Dr. Anner for evaluation of PAD.  She is retired from Pharmacist, community at [employer].  Her risk factors include remote tobacco abuse, treated hypertension, diabetes and hyperlipidemia.  She has never had a stroke but did have coronary stenting in [place] in 2008 and apparently had a heart heart attack in 2015.  She had stenting of her right leg in 2008 and I think her left leg in 2019.  She does complain of dyspnea.  Over the last 3 months she has noticed increasing lower extremity claudication left greater than right.  She had Dopplers that revealed stable ABIs compared to 3 years ago with moderate disease in her proximal right SFA and a high-grade lesion in her left popliteal artery.   Since I saw her 2 months ago I did begin her on Pletal  which really has not changed her symptoms significantly.  She still has left greater than right lower extremity lifestyle-limiting claudication and wishes to proceed with angiography potential endovascular therapy.  Of note, she does have a contrast allergy .     Active Medications      Current Meds  Medication Sig   BREZTRI  AEROSPHERE 160-9-4.8 MCG/ACT AERO inhaler INHALE 2 INHALATIONS BY MOUTH  TWICE DAILY TO PREVENT COUGH OR  WHEEZE. RINSE, GARGLE, AND SPIT  AFTER USE   budesonide  (PULMICORT ) 0.5 MG/2ML nebulizer solution Take 2 mLs (0.5 mg total) by nebulization in the morning and at bedtime. During respiratory infections for 1-2 weeks at a time.   bumetanide  (BUMEX ) 1 MG tablet TAKE 1 TABLET BY MOUTH DAILY   Cholecalciferol 50 MCG (2000 UT) TABS Take 2,000 Units by mouth daily.   cilostazol  (PLETAL ) 50 MG tablet  Take 1 tablet (50 mg total) by mouth 2 (two) times daily.   clopidogrel  (PLAVIX ) 75 MG tablet TAKE 1 TABLET BY MOUTH DAILY   Continuous Blood Gluc Sensor (DEXCOM G6 SENSOR) MISC USE 1 SENSOR AS NEEDED CHANGE EVERY 10 DAYS   Continuous Blood Gluc Transmit (DEXCOM G6 TRANSMITTER) MISC     Continuous Glucose Sensor (DEXCOM G6 SENSOR) MISC 1 Device by Does not apply route continuous.   diazepam  (VALIUM ) 5 MG tablet Take 1 tablet (5 mg total) by mouth every 12 (twelve) hours as needed for anxiety.   EPINEPHrine  (EPIPEN  2-PAK) 0.3 mg/0.3 mL IJ SOAJ injection Use as directed for severe allergic reactions   FASENRA  PEN 30 MG/ML prefilled autoinjector INJECT 30MG  SUBCUTANEOUSLY EVERY 8 WEEKS   gabapentin  (NEURONTIN ) 300 MG capsule Take 1 capsule (300 mg total) by mouth 3 (three) times daily.   Glucagon  (BAQSIMI  ONE PACK) 3 MG/DOSE POWD Place 1 Device into the nose as needed (Low blood sugar with impaired consciousness).   glucose 4 GM chewable tablet Chew 1 tablet by mouth once as needed for low blood sugar.   HUMALOG  100 UNIT/ML injection Inject up too 200 units into pump daily.   Insulin  Disposable Pump (OMNIPOD 5 DEXG7G6 PODS GEN 5) MISC 1 Device by Does not apply route every other day.   insulin  glargine (LANTUS  SOLOSTAR) 100 UNIT/ML Solostar Pen Inject 55 Units into the skin daily.   insulin  lispro (  HUMALOG  KWIKPEN) 100 UNIT/ML KwikPen Based on carb and correction ratio, max dose 80 units a day   Insulin  Pen Needle (PEN NEEDLES) 32G X 4 MM MISC 1 Device by Does not apply route in the morning, at noon, in the evening, and at bedtime.   isosorbide  mononitrate (IMDUR ) 60 MG 24 hr tablet TAKE 1 TABLET BY MOUTH DAILY   levalbuterol  (XOPENEX  HFA) 45 MCG/ACT inhaler USE 2 INHALATIONS BY MOUTH EVERY 4 TO 6 HOURS AS NEEDED FOR  SHORTNESS OF BREATH , COUGH,  WHEEZE AND TIGHTNESS IN CHEST   levalbuterol  (XOPENEX ) 0.63 MG/3ML nebulizer solution Take 3 mLs (0.63 mg total) by nebulization every 4 (four) hours as  needed for wheezing or shortness of breath (coughing fits).   levocetirizine (XYZAL ) 5 MG tablet Take 1 tablet (5 mg total) by mouth every evening.   meclizine  (ANTIVERT ) 25 MG tablet Take 1 tablet (25 mg total) by mouth 3 (three) times daily as needed for dizziness.   methocarbamol  (ROBAXIN ) 500 MG tablet Take 1 tablet (500 mg total) by mouth every 6 (Vega) hours as needed for muscle spasms.   metoprolol  tartrate (LOPRESSOR ) 50 MG tablet TAKE 1 TABLET BY MOUTH EVERY 12  HOURS PT NEEDS TO KEEP UPCOMING  APPOINTMENT IN MAR TO AVOID ANY  MISSED DOSES   montelukast  (SINGULAIR ) 10 MG tablet TAKE 1 TABLET BY MOUTH AT  BEDTIME   nitroGLYCERIN  (NITROSTAT ) 0.4 MG SL tablet Place 1 tablet (0.4 mg total) under the tongue every 5 (five) minutes as needed for chest pain.   pantoprazole  (PROTONIX ) 40 MG tablet TAKE 1 TABLET BY MOUTH IN THE  MORNING AND AT BEDTIME   potassium chloride  (KLOR-CON ) 10 MEQ tablet Take 1 tablet (10 mEq total) by mouth daily.   Respiratory Therapy Supplies (CARETOUCH 2 CPAP HOSE HANGER) MISC by Does not apply route.   rosuvastatin  (CRESTOR ) 40 MG tablet TAKE 1 TABLET BY MOUTH AT  BEDTIME   tirzepatide  (MOUNJARO ) 7.5 MG/0.5ML Pen Inject 7.5 mg into the skin once a week.   [DISCONTINUED] empagliflozin  (JARDIANCE ) 10 MG TABS tablet Take 1 tablet (10 mg total) by mouth daily before breakfast.       Current Facility-Administered Medications for the 09/01/23 encounter (Office Visit) with Katrina Katrina Vega PARAS, MD  Medication   Benralizumab  SOSY 30 mg        Allergies       Allergies  Allergen Reactions   Contrast Media [Iodinated Contrast Media] Shortness Of Breath and Other (See Comments)      sob, chest tight 2024, small amount in joint injection tolerated well   Morphine Itching   Ioversol     Oxycodone  Other (See Comments)   Tramadol  Itching and Nausea Only        Social History         Socioeconomic History   Marital status: Single      Spouse name: Not on file   Number  of children: 5   Years of education: Not on file   Highest education level: Associate degree: academic program  Occupational History   Not on file  Tobacco Use   Smoking status: Former      Current packs/day: 0.00      Average packs/day: 0.5 packs/day for 15.0 years (7.5 ttl pk-yrs)      Types: Cigarettes      Start date: 10/13/1980      Quit date: 10/14/1995      Years since quitting: 27.9      Passive  exposure: Current   Smokeless tobacco: Never  Vaping Use   Vaping status: Never Used  Substance and Sexual Activity   Alcohol use: Not Currently   Drug use: Not Currently   Sexual activity: Yes  Other Topics Concern   Not on file  Social History Narrative   Not on file    Social Drivers of Health        Financial Resource Strain: Low Risk  (08/15/2023)    Overall Financial Resource Strain (CARDIA)     Difficulty of Paying Living Expenses: Not very hard  Food Insecurity: No Food Insecurity (08/15/2023)    Hunger Vital Sign     Worried About Running Out of Food in the Last Year: Never true     Ran Out of Food in the Last Year: Never true  Transportation Needs: No Transportation Needs (08/15/2023)    PRAPARE - Therapist, art (Medical): No     Lack of Transportation (Non-Medical): No  Physical Activity: Insufficiently Active (08/15/2023)    Exercise Vital Sign     Days of Exercise per Week: 1 day     Minutes of Exercise per Session: 20 min  Stress: No Stress Concern Present (08/15/2023)    [employer] of Occupational Health - Occupational Stress Questionnaire     Feeling of Stress: Only a little  Social Connections: Moderately Integrated (08/15/2023)    Social Connection and Isolation Panel     Frequency of Communication with Friends and Family: More than three times a week     Frequency of Social Gatherings with Friends and Family: Once a week     Attends Religious Services: More than 4 times per year     Active Member of [employer] or  Organizations: Yes     Attends Banker Meetings: More than 4 times per year     Marital Status: Never married  Intimate Partner Violence: Not At Risk (07/16/2021)    Humiliation, Afraid, Rape, and Kick questionnaire     Fear of Current or Ex-Partner: No     Emotionally Abused: No     Physically Abused: No     Sexually Abused: No      Review of Systems: General: negative for chills, fever, night sweats or weight changes.  Cardiovascular: negative for chest pain, dyspnea on exertion, edema, orthopnea, palpitations, paroxysmal nocturnal dyspnea or shortness of breath Dermatological: negative for rash Respiratory: negative for cough or wheezing Urologic: negative for hematuria Abdominal: negative for nausea, vomiting, diarrhea, bright red blood per rectum, melena, or hematemesis Neurologic: negative for visual changes, syncope, or dizziness All other systems reviewed and are otherwise negative except as noted above.       Blood pressure 124/70, pulse 90, height 5' 5 (1.651 m), weight 265 lb 3.2 oz (120.3 kg), SpO2 92%.  General appearance: alert and no distress Neck: no adenopathy, no carotid bruit, no JVD, supple, symmetrical, trachea midline, and thyroid  not enlarged, symmetric, no tenderness/mass/nodules Lungs: clear to auscultation bilaterally Heart: regular rate and rhythm, S1, S2 normal, no murmur, click, rub or gallop Extremities: extremities normal, atraumatic, no cyanosis or edema Pulses: 2+ and symmetric Skin: Skin color, texture, turgor normal. No rashes or lesions Neurologic: Grossly normal   EKG not performed today       ASSESSMENT AND PLAN:    Peripheral arterial disease (HCC) History of PAD status post bilateral lower extremity intervention in the past.  She apparently had a right posterior tibial DES placed  10/08 and left lower extremity intervention as well.  She has had lifestyle-limiting claudication left greater than right.  To start her on Pletal   which really does not affected her symptoms.  Dopplers performed/20/25 revealed a right ABI of 0.98 and left of 0.87.  She had mild disease in her distal right SFA with a patent stent and high-grade disease in her left popliteal artery.  She wishes to proceed with endovascular therapy.  She does have a contrast allergy .     Katrina Vega DOROTHA Vega, M.D., FACP, [facility], [place], [facility]  [address] [place], [place]  [address]  [phone] 10/04/2023 11:35 AM

## 2023-10-05 ENCOUNTER — Encounter: Payer: Self-pay | Admitting: Family

## 2023-10-05 ENCOUNTER — Encounter: Admitting: Physical Therapy

## 2023-10-05 ENCOUNTER — Other Ambulatory Visit: Payer: Self-pay | Admitting: Family Medicine

## 2023-10-05 DIAGNOSIS — M545 Low back pain, unspecified: Secondary | ICD-10-CM

## 2023-10-06 ENCOUNTER — Telehealth: Payer: Self-pay | Admitting: *Deleted

## 2023-10-06 NOTE — Telephone Encounter (Addendum)
 LEA scheduled at [facility] for: Thursday October 08, 2023 7:30 AM Arrival time [facility] Main Entrance A at: 5:30 AM  Diet: -Nothing to eat after midnight prior to procedure.  Hydration: -May drink clear liquids until leaving for hospital. Approved liquids: Water , clear tea, black coffee, fruit juices-non-citric and without pulp,Gatorade, plain Jello/popsicles.  Drink 16 oz. bottle of water  on the way to the hospital.  CONTRAST ALLERGY : 13 hour Prednisone  and Benadryl Prep: 10/07/23 Prednisone  50 mg 6:30 PM 10/08/23 Prednisone  50 mg 12:30 AM 10/08/23 Prednisone  50 mg and Benadryl 50 mg just prior to leaving home for hospital Do not drive after taking Benadryl.  Medication instructions: -Hold:  Bumex -day before and day of procedure -per protocol GFR < 60 (45)  Insulin  pump- pt will manage-will talk with endocrinologist  Mounjaro -weekly on Sundays-did not take 10/04/23 -Other usual morning medications can be taken including aspirin  81 mg and Plavix  75 mg.  Plan to go home the same day, you will only stay overnight if medically necessary.  You must have responsible adult to drive you home.  Someone must be with you the first 24 hours after you arrive home.  Reviewed procedure instructions with patient.

## 2023-10-07 ENCOUNTER — Encounter

## 2023-10-07 ENCOUNTER — Encounter: Payer: Self-pay | Admitting: "Endocrinology

## 2023-10-07 NOTE — Telephone Encounter (Signed)
 Called pt and instructed her how to pause her insulin  pump for her surgery.

## 2023-10-08 ENCOUNTER — Other Ambulatory Visit: Payer: Self-pay

## 2023-10-08 ENCOUNTER — Ambulatory Visit (HOSPITAL_COMMUNITY)
Admission: RE | Admit: 2023-10-08 | Discharge: 2023-10-09 | Disposition: A | Discharge status: Home / Self Care | Attending: Cardiovascular Disease | Admitting: Cardiovascular Disease

## 2023-10-08 ENCOUNTER — Encounter (HOSPITAL_COMMUNITY): Payer: Self-pay | Admitting: Cardiovascular Disease

## 2023-10-08 ENCOUNTER — Encounter (HOSPITAL_COMMUNITY)
Admission: RE | Disposition: A | Discharge status: Home / Self Care | Payer: Self-pay | Source: Home / Self Care | Attending: Cardiovascular Disease

## 2023-10-08 DIAGNOSIS — Z7982 Long term (current) use of aspirin: Secondary | ICD-10-CM | POA: Diagnosis not present

## 2023-10-08 DIAGNOSIS — I2510 Atherosclerotic heart disease of native coronary artery without angina pectoris: Secondary | ICD-10-CM | POA: Insufficient documentation

## 2023-10-08 DIAGNOSIS — Z7985 Long-term (current) use of injectable non-insulin antidiabetic drugs: Secondary | ICD-10-CM | POA: Diagnosis not present

## 2023-10-08 DIAGNOSIS — Z91041 Radiographic dye allergy status: Secondary | ICD-10-CM | POA: Insufficient documentation

## 2023-10-08 DIAGNOSIS — E1151 Type 2 diabetes mellitus with diabetic peripheral angiopathy without gangrene: Secondary | ICD-10-CM | POA: Insufficient documentation

## 2023-10-08 DIAGNOSIS — I252 Old myocardial infarction: Secondary | ICD-10-CM | POA: Diagnosis not present

## 2023-10-08 DIAGNOSIS — Z7984 Long term (current) use of oral hypoglycemic drugs: Secondary | ICD-10-CM | POA: Diagnosis not present

## 2023-10-08 DIAGNOSIS — Z794 Long term (current) use of insulin: Secondary | ICD-10-CM | POA: Diagnosis not present

## 2023-10-08 DIAGNOSIS — Z9641 Presence of insulin pump (external) (internal): Secondary | ICD-10-CM | POA: Insufficient documentation

## 2023-10-08 DIAGNOSIS — I739 Peripheral vascular disease, unspecified: Secondary | ICD-10-CM | POA: Diagnosis present

## 2023-10-08 DIAGNOSIS — I70213 Atherosclerosis of native arteries of extremities with intermittent claudication, bilateral legs: Secondary | ICD-10-CM

## 2023-10-08 DIAGNOSIS — Z955 Presence of coronary angioplasty implant and graft: Secondary | ICD-10-CM | POA: Diagnosis not present

## 2023-10-08 DIAGNOSIS — Z7902 Long term (current) use of antithrombotics/antiplatelets: Secondary | ICD-10-CM | POA: Diagnosis not present

## 2023-10-08 DIAGNOSIS — Z79899 Other long term (current) drug therapy: Secondary | ICD-10-CM | POA: Insufficient documentation

## 2023-10-08 DIAGNOSIS — Z87891 Personal history of nicotine dependence: Secondary | ICD-10-CM | POA: Diagnosis not present

## 2023-10-08 DIAGNOSIS — Z95828 Presence of other vascular implants and grafts: Secondary | ICD-10-CM | POA: Insufficient documentation

## 2023-10-08 HISTORY — PX: ABDOMINAL AORTOGRAM: CATH118222

## 2023-10-08 HISTORY — PX: LOWER EXTREMITY ANGIOGRAPHY: CATH118251

## 2023-10-08 HISTORY — DX: Gastro-esophageal reflux disease without esophagitis: K21.9

## 2023-10-08 HISTORY — DX: Personal history of other diseases of the digestive system: Z87.19

## 2023-10-08 HISTORY — PX: LOWER EXTREMITY INTERVENTION: CATH118252

## 2023-10-08 HISTORY — DX: Unspecified osteoarthritis, unspecified site: M19.90

## 2023-10-08 HISTORY — DX: Anemia, unspecified: D64.9

## 2023-10-08 LAB — GLUCOSE, CAPILLARY
Glucose-Capillary: 204 mg/dL — ABNORMAL HIGH (ref 70–99)
Glucose-Capillary: 182 mg/dL — ABNORMAL HIGH (ref 70–99)
Glucose-Capillary: 219 mg/dL — ABNORMAL HIGH (ref 70–99)

## 2023-10-08 LAB — POCT ACTIVATED CLOTTING TIME
Activated Clotting Time: 273 s
Activated Clotting Time: 337 s
Activated Clotting Time: 205 s
Activated Clotting Time: 158 s

## 2023-10-08 MED ORDER — CLOPIDOGREL BISULFATE 75 MG PO TABS
75.0000 mg | ORAL_TABLET | Freq: Every day | ORAL | Status: DC
  Administered 2023-10-09: 75 mg via ORAL
  Filled 2023-10-08: qty 1

## 2023-10-08 MED ORDER — LIDOCAINE HCL (PF) 1 % IJ SOLN
INTRAMUSCULAR | Status: AC
Start: 2023-10-08 — End: 2023-10-08
  Filled 2023-10-08: qty 30

## 2023-10-08 MED ORDER — MONTELUKAST SODIUM 10 MG PO TABS
10.0000 mg | ORAL_TABLET | Freq: Every day | ORAL | Status: DC
  Administered 2023-10-08: 10 mg via ORAL
  Filled 2023-10-08: qty 1

## 2023-10-08 MED ORDER — SODIUM CHLORIDE 0.9 % IV SOLN: INTRAVENOUS | Status: AC

## 2023-10-08 MED ORDER — METOPROLOL TARTRATE 50 MG PO TABS
50.0000 mg | ORAL_TABLET | Freq: Two times a day (BID) | ORAL | Status: DC
  Administered 2023-10-08 – 2023-10-09 (×2): 50 mg via ORAL
  Filled 2023-10-08 (×2): qty 1

## 2023-10-08 MED ORDER — ISOSORBIDE MONONITRATE ER 60 MG PO TB24
60.0000 mg | ORAL_TABLET | Freq: Every day | ORAL | Status: DC
  Administered 2023-10-09: 60 mg via ORAL
  Filled 2023-10-08: qty 1

## 2023-10-08 MED ORDER — SODIUM CHLORIDE 0.9% FLUSH: 3.0000 mL | INTRAVENOUS | Status: DC | PRN

## 2023-10-08 MED ORDER — SODIUM CHLORIDE 0.9 % IV SOLN: 250.0000 mL | INTRAVENOUS | Status: DC | PRN

## 2023-10-08 MED ORDER — INSULIN PUMP
Freq: Three times a day (TID) | SUBCUTANEOUS | Status: DC
  Filled 2023-10-08: qty 1

## 2023-10-08 MED ORDER — FENTANYL CITRATE (PF) 100 MCG/2ML IJ SOLN
INTRAMUSCULAR | Status: DC | PRN
  Administered 2023-10-08: 25 ug via INTRAVENOUS

## 2023-10-08 MED ORDER — ACETAMINOPHEN 325 MG PO TABS
ORAL_TABLET | ORAL | Status: AC
  Filled 2023-10-08: qty 2

## 2023-10-08 MED ORDER — NITROGLYCERIN 0.4 MG SL SUBL: 0.4000 mg | SUBLINGUAL_TABLET | SUBLINGUAL | Status: DC | PRN

## 2023-10-08 MED ORDER — LIDOCAINE HCL (PF) 1 % IJ SOLN
INTRAMUSCULAR | Status: DC | PRN
  Administered 2023-10-08: 10 mL via INTRADERMAL

## 2023-10-08 MED ORDER — CLOPIDOGREL BISULFATE 75 MG PO TABS: 75.0000 mg | ORAL_TABLET | Freq: Every day | ORAL | Status: DC

## 2023-10-08 MED ORDER — ASPIRIN 81 MG PO TBEC
81.0000 mg | DELAYED_RELEASE_TABLET | Freq: Every day | ORAL | Status: DC
  Administered 2023-10-09: 81 mg via ORAL
  Filled 2023-10-08 (×2): qty 1

## 2023-10-08 MED ORDER — HEPARIN (PORCINE) IN NACL 1000-0.9 UT/500ML-% IV SOLN
INTRAVENOUS | Status: DC | PRN
  Administered 2023-10-08 (×2): 500 mL

## 2023-10-08 MED ORDER — SODIUM CHLORIDE 0.9% FLUSH: 3.0000 mL | Freq: Two times a day (BID) | INTRAVENOUS | Status: DC

## 2023-10-08 MED ORDER — ACETAMINOPHEN 325 MG PO TABS
650.0000 mg | ORAL_TABLET | ORAL | Status: DC | PRN
  Administered 2023-10-08 (×2): 650 mg via ORAL
  Filled 2023-10-08: qty 2

## 2023-10-08 MED ORDER — HEPARIN SODIUM (PORCINE) 1000 UNIT/ML IJ SOLN
INTRAMUSCULAR | Status: AC
  Filled 2023-10-08: qty 20

## 2023-10-08 MED ORDER — HEPARIN SODIUM (PORCINE) 1000 UNIT/ML IJ SOLN
INTRAMUSCULAR | Status: DC | PRN
Start: 2023-10-08 — End: 2023-10-08
  Administered 2023-10-08: 12000 [IU] via INTRAVENOUS

## 2023-10-08 MED ORDER — MIDAZOLAM HCL 2 MG/2ML IJ SOLN
INTRAMUSCULAR | Status: AC
  Filled 2023-10-08: qty 2

## 2023-10-08 MED ORDER — LABETALOL HCL 5 MG/ML IV SOLN: 10.0000 mg | INTRAVENOUS | Status: DC | PRN

## 2023-10-08 MED ORDER — FREE WATER: 250.0000 mL | Freq: Once | Status: DC

## 2023-10-08 MED ORDER — HYDRALAZINE HCL 20 MG/ML IJ SOLN: 5.0000 mg | INTRAMUSCULAR | Status: DC | PRN

## 2023-10-08 MED ORDER — FENTANYL CITRATE (PF) 100 MCG/2ML IJ SOLN
INTRAMUSCULAR | Status: AC
  Filled 2023-10-08: qty 2

## 2023-10-08 MED ORDER — ONDANSETRON HCL 4 MG/2ML IJ SOLN: 4.0000 mg | Freq: Four times a day (QID) | INTRAMUSCULAR | Status: DC | PRN

## 2023-10-08 MED ORDER — SODIUM CHLORIDE 0.9% FLUSH
3.0000 mL | Freq: Two times a day (BID) | INTRAVENOUS | Status: DC
  Administered 2023-10-08: 3 mL via INTRAVENOUS

## 2023-10-08 MED ORDER — ROSUVASTATIN CALCIUM 20 MG PO TABS
40.0000 mg | ORAL_TABLET | Freq: Every day | ORAL | Status: DC
Start: 2023-10-08 — End: 2023-10-09
  Administered 2023-10-08: 40 mg via ORAL
  Filled 2023-10-08: qty 2

## 2023-10-08 MED ORDER — MIDAZOLAM HCL 2 MG/2ML IJ SOLN
INTRAMUSCULAR | Status: DC | PRN
  Administered 2023-10-08: 1 mg via INTRAVENOUS

## 2023-10-08 SURGICAL SUPPLY — 21 items
BALLOON MUSTANG 5.0X40 135 (BALLOONS) IMPLANT
CATH ANGIO 5F BER2 65CM (CATHETERS) IMPLANT
CATH ANGIO 5F PIGTAIL 65CM (CATHETERS) IMPLANT
CATH CROSS OVER TEMPO 5F (CATHETERS) IMPLANT
DCB IN.PACT 6X60 (BALLOONS) IMPLANT
GUIDEWIRE ANGLED .035 180CM (WIRE) IMPLANT
KIT ENCORE 26 ADVANTAGE (KITS) IMPLANT
KIT PV (KITS) ×1 IMPLANT
KIT SINGLE USE MANIFOLD (KITS) IMPLANT
KIT SYRINGE INJ CVI SPIKEX1 (MISCELLANEOUS) IMPLANT
SET ATX-X65L (MISCELLANEOUS) IMPLANT
SHEATH CATAPULT 6FR 45 (SHEATH) IMPLANT
SHEATH PINNACLE 5F 10CM (SHEATH) IMPLANT
SHEATH PINNACLE 6F 10CM (SHEATH) IMPLANT
SYR MEDRAD MARK 7 150ML (SYRINGE) ×1 IMPLANT
TAPE SHOOT N SEE (TAPE) IMPLANT
TRANSDUCER W/STOPCOCK (MISCELLANEOUS) ×1 IMPLANT
TRAY PV CATH (CUSTOM PROCEDURE TRAY) ×1 IMPLANT
WIRE HI TORQ VERSACORE 300 (WIRE) IMPLANT
WIRE HITORQ VERSACORE ST 145CM (WIRE) IMPLANT
WIRE ROSEN-J .035X260CM (WIRE) IMPLANT

## 2023-10-08 NOTE — Progress Notes (Signed)
 Site area: Right femoral Site Prior to Removal:  Level 0, clean , dry and intact Pressure Applied For: 30 minutes Manual:   Yes Patient Status During Pull:  stable Post Pull Site:  Level 0, clean dry and intact Post Pull Instructions Given:   Post Pull Pulses Present: bilateral DP's +2 Dressing Applied:  gauze and tegaderm Bedrest begins @ 1320 Comments:

## 2023-10-08 NOTE — Interval H&P Note (Signed)
 History and Physical Interval Note:  10/08/2023 7:28 AM  Katrina Vega  has presented today for surgery, with the diagnosis of pad.  The various methods of treatment have been discussed with the patient and family. After consideration of risks, benefits and other options for treatment, the patient has consented to  Procedure(s): Lower Extremity Angiography (N/A) as a surgical intervention.  The patient's history has been reviewed, patient examined, no change in status, stable for surgery.  I have reviewed the patient's chart and labs.  Questions were answered to the patient's satisfaction.     Dorn Lesches

## 2023-10-08 NOTE — Progress Notes (Signed)
 Patient arrived in the Unit from Cathlab, she is alert and oriented, V/S obtained, CCMD notified, CHG bath given, vascular site is CDI, all needs met, call bell in reach.   10/08/23 1512  Vitals  Temp 97.9 F (36.6 C)  Temp Source Oral  BP (!) 144/65  MAP (mmHg) 85  BP Location Right Arm  BP Method Automatic  Patient Position (if appropriate) Lying  Pulse Rate 69  Pulse Rate Source Monitor  ECG Heart Rate 68  Resp 18  Level of Consciousness  Level of Consciousness Alert  MEWS COLOR  MEWS Score Color Green  Oxygen Therapy  SpO2 98 %  O2 Device Room Air  MEWS Score  MEWS Temp 0  MEWS Systolic 0  MEWS Pulse 0  MEWS RR 0  MEWS LOC 0  MEWS Score 0

## 2023-10-08 NOTE — Progress Notes (Signed)
 Patient has a insulin  pump, her CBG was 288 after dinner at 1740, gave herself 1.85 unit of bolus.  insulin 

## 2023-10-09 ENCOUNTER — Encounter: Payer: Self-pay | Admitting: Family

## 2023-10-09 ENCOUNTER — Encounter (HOSPITAL_COMMUNITY): Payer: Self-pay | Admitting: Cardiovascular Disease

## 2023-10-09 ENCOUNTER — Other Ambulatory Visit (HOSPITAL_COMMUNITY): Payer: Self-pay

## 2023-10-09 DIAGNOSIS — Z7985 Long-term (current) use of injectable non-insulin antidiabetic drugs: Secondary | ICD-10-CM | POA: Diagnosis not present

## 2023-10-09 DIAGNOSIS — E119 Type 2 diabetes mellitus without complications: Secondary | ICD-10-CM | POA: Diagnosis not present

## 2023-10-09 DIAGNOSIS — Z794 Long term (current) use of insulin: Secondary | ICD-10-CM | POA: Diagnosis not present

## 2023-10-09 DIAGNOSIS — Z7984 Long term (current) use of oral hypoglycemic drugs: Secondary | ICD-10-CM | POA: Diagnosis not present

## 2023-10-09 DIAGNOSIS — I739 Peripheral vascular disease, unspecified: Secondary | ICD-10-CM

## 2023-10-09 DIAGNOSIS — Z955 Presence of coronary angioplasty implant and graft: Secondary | ICD-10-CM | POA: Diagnosis not present

## 2023-10-09 DIAGNOSIS — I252 Old myocardial infarction: Secondary | ICD-10-CM | POA: Diagnosis not present

## 2023-10-09 DIAGNOSIS — Z95828 Presence of other vascular implants and grafts: Secondary | ICD-10-CM | POA: Diagnosis not present

## 2023-10-09 DIAGNOSIS — Z79899 Other long term (current) drug therapy: Secondary | ICD-10-CM | POA: Diagnosis not present

## 2023-10-09 DIAGNOSIS — I10 Essential (primary) hypertension: Secondary | ICD-10-CM

## 2023-10-09 DIAGNOSIS — Z9641 Presence of insulin pump (external) (internal): Secondary | ICD-10-CM | POA: Diagnosis not present

## 2023-10-09 DIAGNOSIS — I2510 Atherosclerotic heart disease of native coronary artery without angina pectoris: Secondary | ICD-10-CM | POA: Diagnosis not present

## 2023-10-09 DIAGNOSIS — Z7902 Long term (current) use of antithrombotics/antiplatelets: Secondary | ICD-10-CM | POA: Diagnosis not present

## 2023-10-09 DIAGNOSIS — Z87891 Personal history of nicotine dependence: Secondary | ICD-10-CM | POA: Diagnosis not present

## 2023-10-09 DIAGNOSIS — I70213 Atherosclerosis of native arteries of extremities with intermittent claudication, bilateral legs: Secondary | ICD-10-CM | POA: Diagnosis not present

## 2023-10-09 DIAGNOSIS — Z91041 Radiographic dye allergy status: Secondary | ICD-10-CM | POA: Diagnosis not present

## 2023-10-09 DIAGNOSIS — Z7982 Long term (current) use of aspirin: Secondary | ICD-10-CM | POA: Diagnosis not present

## 2023-10-09 DIAGNOSIS — E1151 Type 2 diabetes mellitus with diabetic peripheral angiopathy without gangrene: Secondary | ICD-10-CM | POA: Diagnosis not present

## 2023-10-09 LAB — CBC
HCT: 37.1 % (ref 36.0–46.0)
Hemoglobin: 11.5 g/dL — ABNORMAL LOW (ref 12.0–15.0)
MCH: 25.6 pg — ABNORMAL LOW (ref 26.0–34.0)
MCHC: 31 g/dL (ref 30.0–36.0)
MCV: 82.4 fL (ref 80.0–100.0)
Platelets: 269 K/uL (ref 150–400)
RBC: 4.5 MIL/uL (ref 3.87–5.11)
RDW: 16.8 % — ABNORMAL HIGH (ref 11.5–15.5)
WBC: 9.2 K/uL (ref 4.0–10.5)
nRBC: 0 % (ref 0.0–0.2)

## 2023-10-09 LAB — BASIC METABOLIC PANEL WITH GFR
Anion gap: 5 (ref 5–15)
BUN: 14 mg/dL (ref 8–23)
CO2: 25 mmol/L (ref 22–32)
Calcium: 9.6 mg/dL (ref 8.9–10.3)
Chloride: 107 mmol/L (ref 98–111)
Creatinine, Ser: 1.29 mg/dL — ABNORMAL HIGH (ref 0.44–1.00)
GFR, Estimated: 45 mL/min — ABNORMAL LOW (ref >60)
Glucose, Bld: 121 mg/dL — ABNORMAL HIGH (ref 70–99)
Potassium: 3.9 mmol/L (ref 3.5–5.1)
Sodium: 137 mmol/L (ref 135–145)

## 2023-10-09 LAB — LIPID PANEL
Cholesterol: 84 mg/dL (ref 0–200)
HDL: 28 mg/dL — ABNORMAL LOW (ref >40)
LDL Cholesterol: 38 mg/dL (ref 0–99)
Total CHOL/HDL Ratio: 3 ratio
Triglycerides: 88 mg/dL (ref <150)
VLDL: 18 mg/dL (ref 0–40)

## 2023-10-09 LAB — GLUCOSE, CAPILLARY
Glucose-Capillary: 131 mg/dL — ABNORMAL HIGH (ref 70–99)
Glucose-Capillary: 107 mg/dL — ABNORMAL HIGH (ref 70–99)

## 2023-10-09 MED ORDER — ASPIRIN 81 MG PO TBEC
81.0000 mg | DELAYED_RELEASE_TABLET | Freq: Every day | ORAL | 1 refills | Status: DC
  Filled 2023-10-09: qty 90, 90d supply, fill #0

## 2023-10-09 NOTE — Discharge Summary (Signed)
 Discharge Summary   Patient ID: Katrina Vega MRN: [id number]; DOB: Oct 31, 1955  Admit date: 10/08/2023 Discharge date: 10/09/2023  PCP:  Antonio Cyndee Jamee JONELLE, DO   [facility] Providers Cardiologist:  Alm Clay, MD    Vascular cardiologist: Dr. Court    Discharge Diagnoses  Principal Problem:   Claudication in peripheral vascular disease [facility])   Diagnostic Studies/Procedures  Procedures Performed:             1.  Ultrasound-guided right common femoral access             2.  Abdominal aortogram/bilateral iliac angiogram/bifemoral runoff             3.  Contralateral access (secondary catheter placement)             4.  PTA/DCB left SFA   _____________   History of Present Illness   Katrina Vega is a 68 y.o. female with hypertension, hyperlipidemia, type 2 DM, CAD w/prior PCI, h/o stroke, PAD w/prior b/l LE stenting   Hospital Course   Consultants: Dr. Court  Patient underwent successful PTA w/DCB to Lt SFA (10/08/2023). Left leg already feels better. Right groin cath site looks clean, no hematoma.   PAD: Aspirin /Plavix  for at least 3 months. Outpatient vascular studies and follow up arranged. Resume lisinopril  20 mg daily when home. Hydrate well.  CAD: Continue Imdur  60 mg daily Metoprolol  tartrate 50 mg bid Crestor  40 mg daily   Type 2 DM: Continue insulin  pump.     Did the patient have an acute coronary syndrome (MI, NSTEMI, STEMI, etc) this admission?:  No                               Did the patient have a percutaneous coronary intervention (stent / angioplasty)?:  No.          _____________  Discharge Vitals Blood pressure 122/68, pulse 73, temperature 98.1 F (36.7 C), temperature source Oral, resp. rate 16, height 5' 5 (1.651 m), weight 120.2 kg, SpO2 96%.  Filed Weights   10/08/23 0609  Weight: 120.2 kg    Physical Exam Vitals and nursing note reviewed.  Constitutional:      General: She is not in acute distress. Neck:      Vascular: No JVD.  Cardiovascular:     Rate and Rhythm: Normal rate and regular rhythm.     Heart sounds: Normal heart sounds. No murmur heard. Pulmonary:     Effort: Pulmonary effort is normal.     Breath sounds: Normal breath sounds. No wheezing or rales.      Labs & Radiologic Studies  CBC Recent Labs    10/09/23 0352  WBC 9.2  HGB 11.5*  HCT 37.1  MCV 82.4  PLT 269   Basic Metabolic Panel Recent Labs    91/84/74 0352  NA 137  K 3.9  CL 107  CO2 25  GLUCOSE 121*  BUN 14  CREATININE 1.29*  CALCIUM  9.6    Recent Labs    10/09/23 0352  CHOL 84  HDL 28*  LDLCALC 38  TRIG 88  CHOLHDL 3.0   No results found for: LIPOA  Thyroid  Function Tests No results for input(s): TSH, T4TOTAL, T3FREE, THYROIDAB in the last 72 hours.  Invalid input(s): FREET3 _____________  PERIPHERAL VASCULAR CATHETERIZATION Result Date: 10/08/2023 Images from the original result were not included.  [id number] LOCATION:  FACILITY: [facility] PHYSICIAN: Dorn  Court, M.D. Oct 03, 1955 DATE OF PROCEDURE:  10/08/2023 DATE OF DISCHARGE: PV Angiogram/Intervention History obtained from chart review. Katrina Vega is a 68 y.o.   68 year old moderately overweight single African-American female mother of 5, grandmother of 9 grandchildren referred by Dr. Anner for evaluation of PAD.  She is retired from Pharmacist, community at [employer] .  Her risk factors include remote tobacco abuse, treated hypertension, diabetes and hyperlipidemia.  She has never had a stroke but did have coronary stenting in [place] in 2008 and apparently had a heart heart attack in 2015.  She had stenting of her right leg in 2008 and I think her left leg in 2019.  She does complain of dyspnea.  Over the last 3 months she has noticed increasing lower extremity claudication left greater than right.  She had Dopplers that revealed stable ABIs compared to 3 years ago with moderate disease in her proximal right SFA and a  high-grade lesion in her left popliteal artery.  Since I saw her 2 months ago I did begin her on Pletal  which really has not changed her symptoms significantly.  She still has left greater than right lower extremity lifestyle-limiting claudication and wishes to proceed with angiography potential endovascular therapy.  Of note, she does have a contrast allergy . Pre Procedure Diagnosis: Peripheral arterial disease Post Procedure Diagnosis: Peripheral arterial disease Operators: Dr. Dorn Court Procedures Performed:  1.  Ultrasound-guided right common femoral access  2.  Abdominal aortogram/bilateral iliac angiogram/bifemoral runoff  3.  Contralateral access (secondary catheter placement)  4.  PTA/DCB left SFA PROCEDURE DESCRIPTION: The patient was brought to the second floor [facility] [facility] cath lab in the the postabsorptive state. She was premedicated with IV Versed  and fentanyl .  She was also given contrast allergy  prophylaxis with oral prednisone  and Benadryl.  Her right groin was prepped and shaved in usual sterile fashion. Xylocaine  1% was used for local anesthesia. A 5 French sheath was inserted into the right common femoral artery using standard Seldinger technique.  Records Patel catheters placed in the distal abdominal aorta.  Distal abdominal aortography, bilateral iliac angiography, femoral artery was performed using static Bellas shots.  Omnipaque dye was used for the entirety of case (total contrast given to patient 145 cc).  Aortic pressures monitored for the case.  Angiographic Data: 1: Abdominal aorta-renal arteries were patent.  Infrarenal abdominal aorta had mild atherosclerotic changes 2: Left lower extremity-95% fairly focal in-stent restenosis within the mid to distal left SFA stent with three-vessel runoff. 3: Right lower extremity-75% in-stent restenosis within the mid right SFA stent with three-vessel runoff   Ms. Magner has patent bilateral SFA stents placed in [place]  in 2016  and 2019.  Her left leg is more symptomatic and has a 95% fairly focal mid in-stent restenosis with three-vessel runoff.  Will proceed with PTA using DCB. Procedure Description: Contralateral access was obtained with a crossover catheter, Glidewire, 035 versa core wire and Rosen wire.  The 5 French sheath was exchanged over the wire for a 6 French/45 cm catapult sheath.  Omnipaque dye was used for the entirety of the intervention.  Retrograde aortic pressures monitored of the case.  The patient did receive 12,000's of heparin  with a ending ACT of 273. I was able to select the SFA with a Berenstein 5 French 65 cm directional catheter.  I then crossed the SFA stent with a 035 versa core wire and predilated with a 5 mm x 4 cm balloon.  The plaque yielded fairly  easily.  Angiography did reveal an AV fistula however.  There was no extravasation at the soft tissue.  I then performed DCB with a 6 mm x 6 cm Impact Admiral Balloon at 4 atm for 4 minutes resulting in reduction of a 95% focal mid left in-stent restenosis less than 20% residual.  The AV fistula was sealed with prolonged balloon inflation. Final Impression: Successful mid left SFA DCB for in-stent restenosis and previously placed self-expanding stent.  The patient tolerated procedure well.  She does have residual disease on the right which is fairly symmetric.  [place] she was then withdrawn from the body over the arthrofibrotic wire and exchanged for a 6 French short 11 cm sheath which was secured in place.  The patient had aspirin  and Plavix  this morning.  She left labs stable condition.  She will be gently hydrated overnight and discharge tomorrow morning.  Will get lower extremity arterial Doppler studies in our [place] office next week and I will see her back in 2 to 3 weeks thereafter. Dorn Lesches. MD, [facility] 10/08/2023 9:22 AM     Disposition Pt is being discharged home today in good condition.  Follow-up Plans & Appointments  Follow-up  Information     Lesches Dorn PARAS, MD Follow up on 11/03/2023.   Specialties: Cardiology, Radiology Why: 3:15 PM Contact information: [address] [place] [address] [phone]         CT Imaging at [facility] [place]. [facility] Follow up on 10/22/2023.   Specialty: Radiology Why: 2:00 PM Contact information: [address] [place]  [id number]               Discharge Instructions     Diet - low sodium heart healthy   Complete by: As directed    Increase activity slowly   Complete by: As directed        Discharge Medications Allergies as of 10/09/2023       Reactions   Contrast Media [iodinated Contrast Media] Shortness Of Breath, Other (See Comments)   sob, chest tight 2024, small amount in joint injection tolerated well   Morphine Itching   Ioversol    Oxycodone  Itching   Tramadol  Itching, Nausea Only        Medication List     TAKE these medications    aspirin  EC 81 MG tablet Take 1 tablet (81 mg total) by mouth daily. Swallow whole.   Baqsimi  One Pack 3 MG/DOSE Powd Generic drug: Glucagon  Place 1 Device into the nose as needed (Low blood sugar with impaired consciousness).   Breztri  Aerosphere 160-9-4.8 MCG/ACT Aero inhaler Generic drug: budesonide -glycopyrrolate-formoterol  INHALE 2 INHALATIONS BY MOUTH  TWICE DAILY TO PREVENT COUGH OR  WHEEZE. RINSE, GARGLE, AND SPIT  AFTER USE   budesonide  0.5 MG/2ML nebulizer solution Commonly known as: Pulmicort  Take 2 mLs (0.5 mg total) by nebulization in the morning and at bedtime. During respiratory infections for 1-2 weeks at a time. What changed:  when to take this reasons to take this   bumetanide  1 MG tablet Commonly known as: BUMEX  Take 1 tablet (1 mg total) by mouth daily. What changed:  when to take this reasons to take this   CareTouch 2 CPAP Hose Hanger Misc by Does not apply route.   Cholecalciferol 50 MCG (2000 UT) Tabs Take 2,000  Units by mouth daily.   clopidogrel  75 MG tablet Commonly known as: PLAVIX  TAKE 1 TABLET BY MOUTH DAILY   Dexcom  G6 Sensor Misc 1 Device by Does not apply route continuous.   Dexcom G6 Transmitter Misc   diazepam  5 MG tablet Commonly known as: VALIUM  Take 1 tablet (5 mg total) by mouth every 12 (twelve) hours as needed for anxiety.   diphenhydrAMINE 50 MG tablet Commonly known as: BENADRYL Take 50 mg by mouth daily as needed for itching or allergies.   EPINEPHrine  0.3 mg/0.3 mL Soaj injection Commonly known as: EpiPen  2-Pak Use as directed for severe allergic reactions What changed:  how much to take how to take this when to take this reasons to take this   Fasenra  Pen 30 MG/ML prefilled autoinjector Generic drug: benralizumab  INJECT 30MG  SUBCUTANEOUSLY EVERY 8 WEEKS   gabapentin  300 MG capsule Commonly known as: NEURONTIN  TAKE 1 CAPSULE BY MOUTH 3 TIMES  DAILY   glucose 4 GM chewable tablet Chew 1 tablet by mouth once as needed for low blood sugar.   HumaLOG  KwikPen 100 UNIT/ML KwikPen Generic drug: insulin  lispro Inject into the skin as needed (When unable to use pump). Per Sliding Scale   HumaLOG  100 UNIT/ML injection Generic drug: insulin  lispro Inject up too 200 units into pump daily.   ipratropium 0.03 % nasal spray Commonly known as: ATROVENT  Place 1-2 sprays into both nostrils 2 (two) times daily as needed (nasal drainage).   isosorbide  mononitrate 60 MG 24 hr tablet Commonly known as: IMDUR  TAKE 1 TABLET BY MOUTH DAILY   Lantus  SoloStar 100 UNIT/ML Solostar Pen Generic drug: insulin  glargine Inject 55 Units into the skin daily as needed (When unable to use pump).   levalbuterol  0.63 MG/3ML nebulizer solution Commonly known as: Xopenex  Take 3 mLs (0.63 mg total) by nebulization every 4 (four) hours as needed for wheezing or shortness of breath (coughing fits).   levalbuterol  45 MCG/ACT inhaler Commonly known as: XOPENEX  HFA USE 2 INHALATIONS BY  MOUTH EVERY 4 TO 6 HOURS AS NEEDED FOR  SHORTNESS OF BREATH , COUGH,  WHEEZE AND TIGHTNESS IN CHEST   meclizine  25 MG tablet Commonly known as: ANTIVERT  Take 1 tablet (25 mg total) by mouth 3 (three) times daily as needed for dizziness.   methocarbamol  500 MG tablet Commonly known as: ROBAXIN  Take 1 tablet (500 mg total) by mouth every 6 (six) hours as needed for muscle spasms.   metoprolol  tartrate 50 MG tablet Commonly known as: LOPRESSOR  TAKE 1 TABLET BY MOUTH EVERY 12  HOURS   montelukast  10 MG tablet Commonly known as: Singulair  Take 1 tablet (10 mg total) by mouth at bedtime.   nitroGLYCERIN  0.4 MG SL tablet Commonly known as: NITROSTAT  Place 1 tablet (0.4 mg total) under the tongue every 5 (five) minutes as needed for chest pain.   Omnipod 5 DexG7G6 Pods Gen 5 Misc 1 Device by Does not apply route every other day.   pantoprazole  40 MG tablet Commonly known as: PROTONIX  Take 1 tablet (40 mg total) by mouth 2 (two) times daily before a meal.   Pen Needles 32G X 4 MM Misc 1 Device by Does not apply route in the morning, at noon, in the evening, and at bedtime.   predniSONE  50 MG tablet Commonly known as: DELTASONE  Take 1 tablet 13 hours prior to procedure, 1 tablet 7 hours prior to procedure, and 1 tablet (with Benedryl 50 mg) prior to going to the hospital for procedure   rosuvastatin  40 MG tablet Commonly known as: CRESTOR  TAKE 1 TABLET BY MOUTH AT  BEDTIME   tirzepatide  7.5 MG/0.5ML Pen Commonly  known as: MOUNJARO  Inject 7.5 mg into the skin once a week.         Outstanding Labs/Studies Vascular studies scheduled.   Duration of Discharge Encounter: APP Time: 25 minutes   Signed, Newman JINNY Lawrence, MD 10/09/2023, 9:24 AM

## 2023-10-09 NOTE — Discharge Instructions (Addendum)
 Katrina Vega

## 2023-10-09 NOTE — Progress Notes (Signed)
 Reviewed AVS, patient expressed understanding of medications, MD follow up reviewed.   Removed IV, Site clean, dry and intact.  See LDA for information on wounds at discharge. CCMD contacted and informed patients is being discharged.  Patient states all belongings brought to the hospital at time of admission are accounted for and packed to take home.  Picked up medications from [facility] pharmacy. [facility] contacted to transport patient to entrance A where family member was waiting in vehicle to transport home.

## 2023-10-12 ENCOUNTER — Ambulatory Visit

## 2023-10-12 ENCOUNTER — Other Ambulatory Visit: Payer: Self-pay | Admitting: "Endocrinology

## 2023-10-12 ENCOUNTER — Telehealth: Payer: Self-pay | Admitting: "Endocrinology

## 2023-10-12 DIAGNOSIS — E1165 Type 2 diabetes mellitus with hyperglycemia: Secondary | ICD-10-CM

## 2023-10-12 NOTE — Telephone Encounter (Signed)
 MEDICATION: Mounjaro   PHARMACY: [facility] [address] [place]     HAS THE PATIENT CONTACTED THEIR PHARMACY?  Yes  LAST REFILL:  @@LASTREFILL @  IS THIS A 90 DAY SUPPLY : Yes  IS PATIENT OUT OF MEDICATION: No  IF NOT; HOW MUCH IS LEFT: 1 dose left  LAST APPOINTMENT DATE: @8 /18/2025  NEXT APPOINTMENT DATE:@9 /04/2023  DO WE HAVE YOUR PERMISSION TO LEAVE A DETAILED MESSAGE?: Yes  OTHER COMMENTS:    **Let patient know to contact pharmacy at the end of the day to make sure medication is ready. **  ** Please notify patient to allow 48-72 hours to process**  **Encourage patient to contact the pharmacy for refills or they can request refills through [facility]**

## 2023-10-12 NOTE — Telephone Encounter (Signed)
Rx sent in earlier today

## 2023-10-18 IMAGING — DX DG LUMBAR SPINE COMPLETE 4+V
5 series · 5 of 5 positions shown · non-contrast
Comparison: 11/05/2020

CLINICAL DATA: Fell.  Back pain.

EXAM:
LUMBAR SPINE - COMPLETE 4+ VIEW

[l-spine ap]
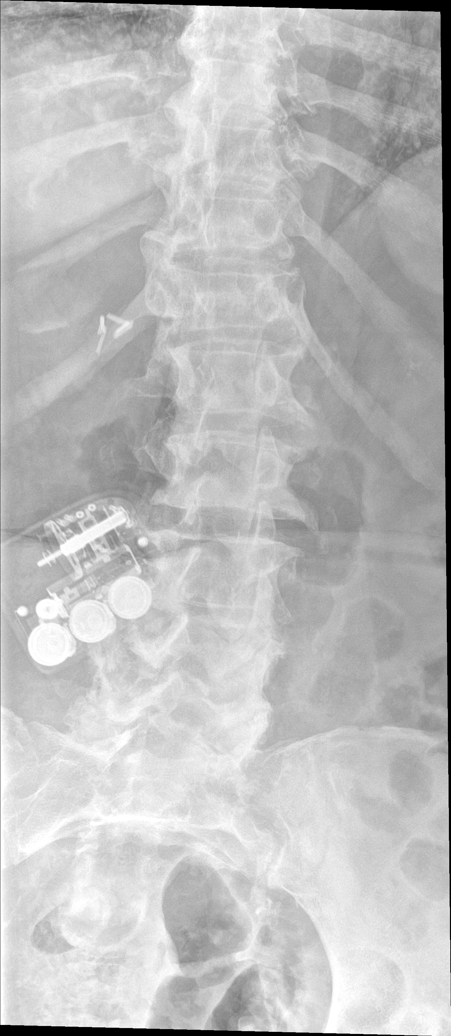

[l-spine obl (1 of 2)]
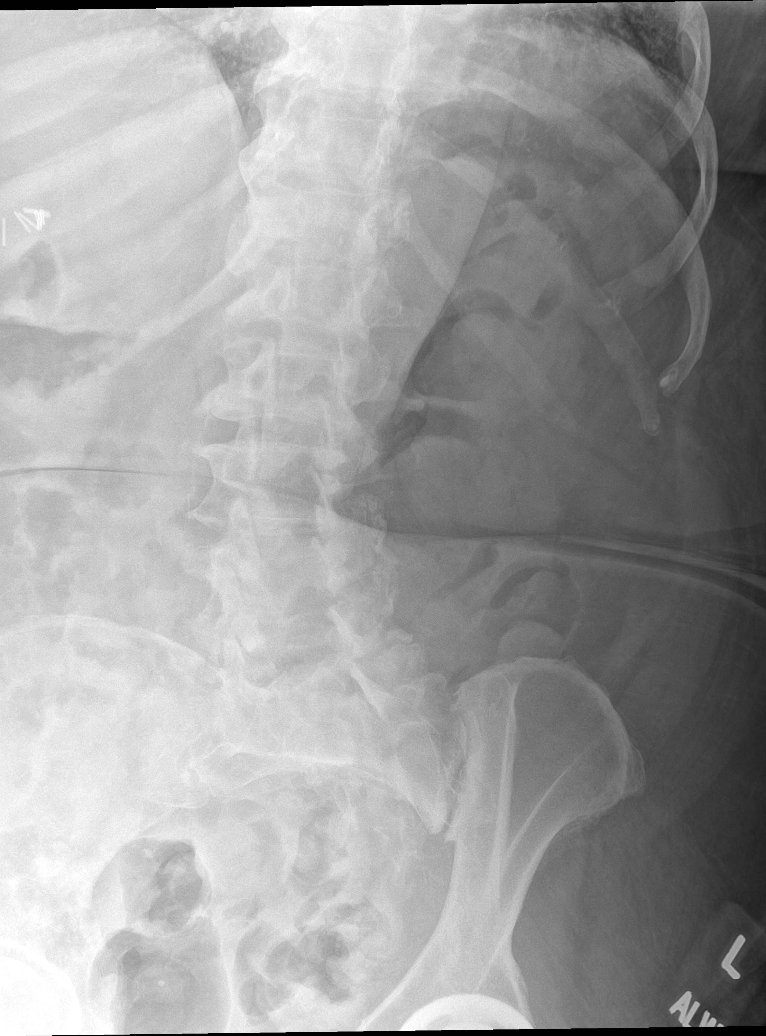

[l-spine lat]
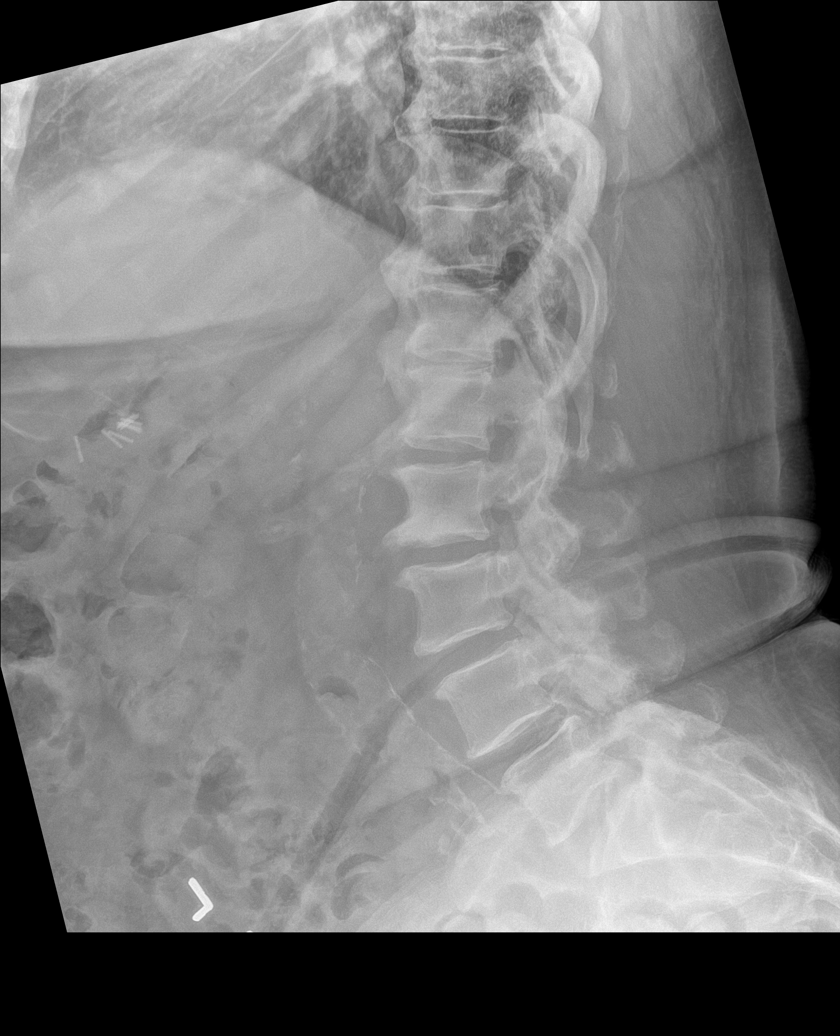

[l-spine spot]
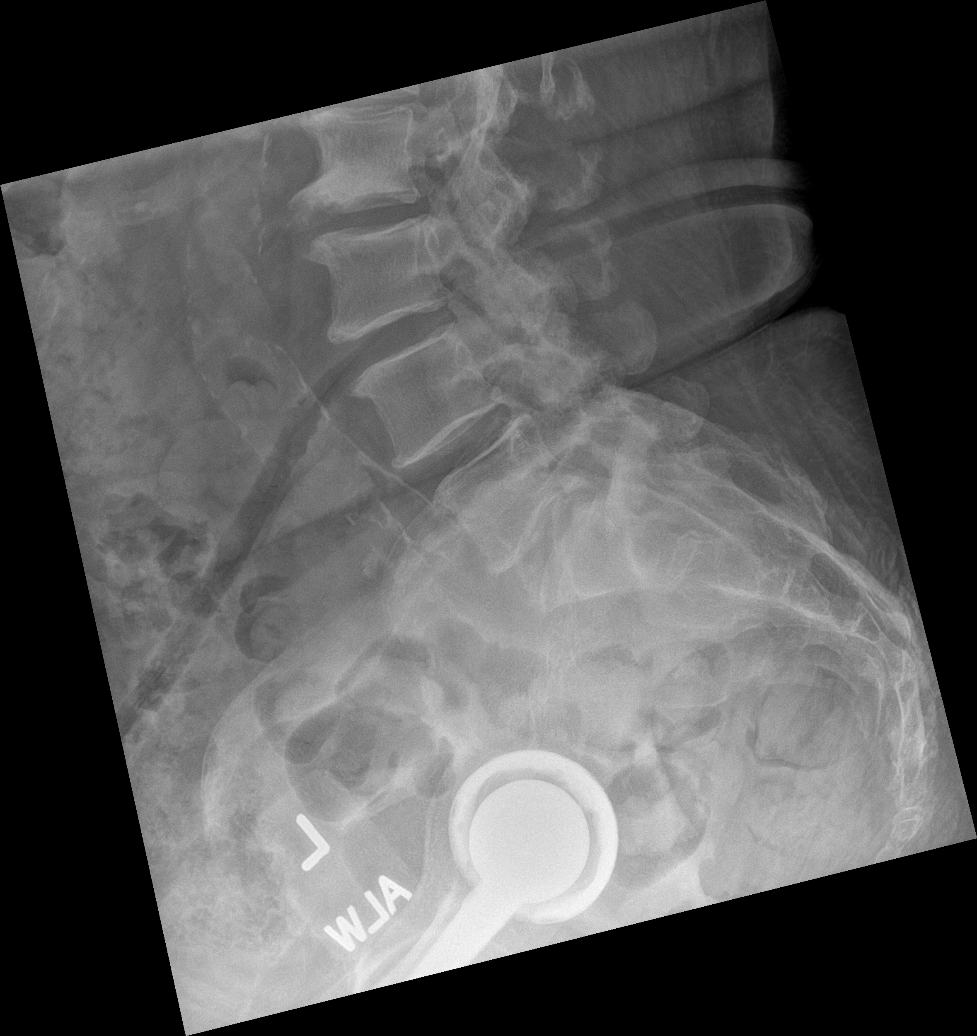

[l-spine obl (2 of 2)]
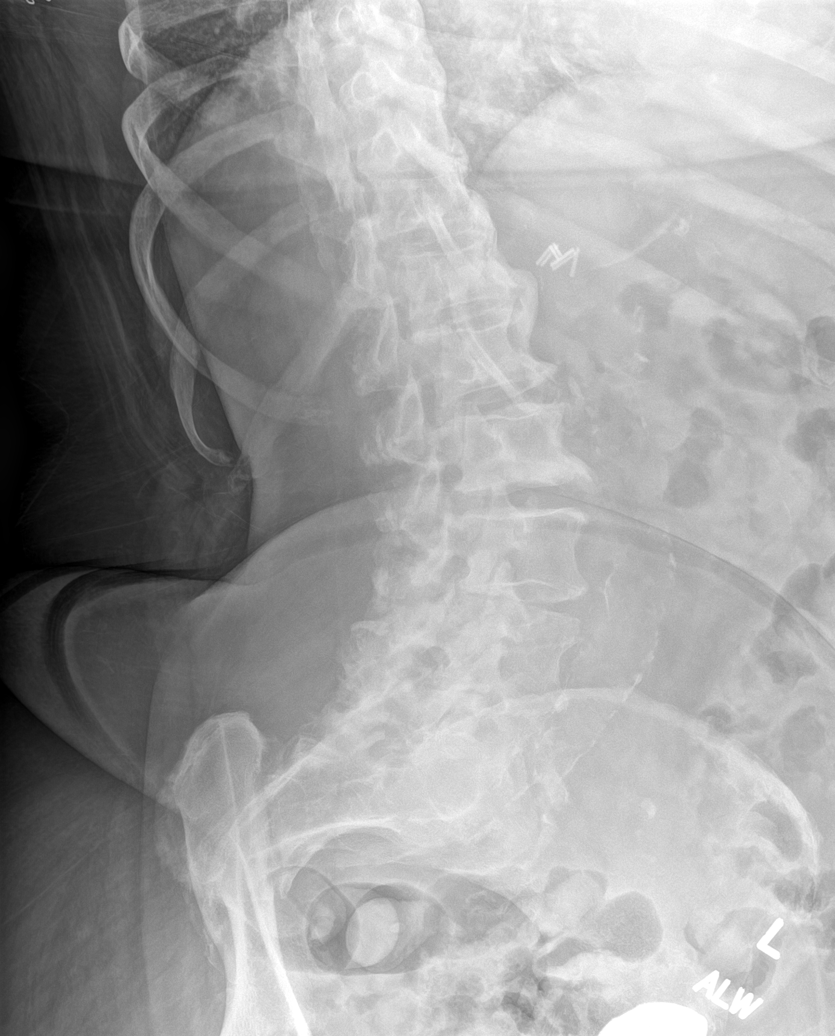

[5 of 5 positions shown; findings below may reference images not displayed]

FINDINGS: Stable degenerative lumbar spondylosis with severe multilevel facet
disease. Disc spaces are fairly well preserved. No acute fractures
identified. Stable vascular calcifications. The visualized bony
pelvis is intact.
IMPRESSION: Stable degenerative lumbar spondylosis with severe multilevel facet
disease. No acute bony findings.

## 2023-10-19 ENCOUNTER — Other Ambulatory Visit: Payer: Self-pay | Admitting: Family Medicine

## 2023-10-19 DIAGNOSIS — Z1231 Encounter for screening mammogram for malignant neoplasm of breast: Secondary | ICD-10-CM

## 2023-10-20 ENCOUNTER — Ambulatory Visit: Admitting: Podiatry

## 2023-10-21 ENCOUNTER — Encounter: Payer: Self-pay | Admitting: Cardiovascular Disease

## 2023-10-22 ENCOUNTER — Ambulatory Visit (HOSPITAL_BASED_OUTPATIENT_CLINIC_OR_DEPARTMENT_OTHER)
Admission: RE | Admit: 2023-10-22 | Discharge: 2023-10-22 | Disposition: A | Discharge status: Home / Self Care | Source: Ambulatory Visit | Attending: Cardiovascular Disease | Admitting: Cardiovascular Disease

## 2023-10-22 ENCOUNTER — Ambulatory Visit (HOSPITAL_COMMUNITY)
Admission: RE | Admit: 2023-10-22 | Discharge: 2023-10-22 | Disposition: A | Discharge status: Home / Self Care | Source: Ambulatory Visit | Attending: Cardiovascular Disease | Admitting: Cardiovascular Disease

## 2023-10-22 DIAGNOSIS — I739 Peripheral vascular disease, unspecified: Secondary | ICD-10-CM | POA: Insufficient documentation

## 2023-10-22 LAB — VAS US ABI WITH/WO TBI
Left ABI: 1.05
Right ABI: 0.89

## 2023-10-25 ENCOUNTER — Other Ambulatory Visit: Payer: Self-pay | Admitting: "Endocrinology

## 2023-10-27 ENCOUNTER — Ambulatory Visit (INDEPENDENT_AMBULATORY_CARE_PROVIDER_SITE_OTHER): Admitting: Podiatry

## 2023-10-27 DIAGNOSIS — M216X2 Other acquired deformities of left foot: Secondary | ICD-10-CM | POA: Diagnosis not present

## 2023-10-27 DIAGNOSIS — E1140 Type 2 diabetes mellitus with diabetic neuropathy, unspecified: Secondary | ICD-10-CM

## 2023-10-27 DIAGNOSIS — M216X1 Other acquired deformities of right foot: Secondary | ICD-10-CM | POA: Diagnosis not present

## 2023-10-27 DIAGNOSIS — E1149 Type 2 diabetes mellitus with other diabetic neurological complication: Secondary | ICD-10-CM

## 2023-10-27 NOTE — Progress Notes (Signed)
 This patient presents to the office with chief complaint of painful callus in the center of both feet  She has pain walking and wearing her shoes.  She returns to the office for treatment of her callus.  Patient has history of diabetic neuropathy, CKD and coagulation defect. Patient had vascular studies performed by Dt.  Patel and she had vascular surgery.  Vascular  Dorsalis pedis and posterior tibial pulses are palpable  B/L.  Capillary return  WNL.  Temperature gradient is  WNL.  Skin turgor  WNL  Sensorium  Senn Weinstein monofilament wire  WNL. Normal tactile sensation.  Nail Exam  Patient has normal nails with no evidence of bacterial or fungal infection.  Orthopedic  Exam  Muscle tone and muscle strength  WNL.  No limitations of motion feet  B/L.  No crepitus or joint effusion noted.  Foot type is unremarkable and digits show no abnormalities.  Bony prominences are unremarkable..  Hammer toes 2-5  B/L.  Plantar flexed third metatarsal  B/L.  Skin  No open lesions.  Normal skin texture and turgor.   Porokeratosis secondary to plantar flex metatarsal  B/L.  ROV.  Discussed her results from the vascular studies.  Cordella Bold DPM

## 2023-10-27 NOTE — Telephone Encounter (Signed)
 Requested Prescriptions   Pending Prescriptions Disp Refills   HUMALOG  KWIKPEN 100 UNIT/ML KwikPen [Pharmacy Med Name: HumaLOG  KwikPen 100 UNIT/ML Subcutaneous Solution Pen-injector] 45 mL 5    Sig: INJECT SUBCUTANEOUSLY AS  DIRECTED BASED ON CARB AND  CORRECTION RATIO, MAX DOSE 80  UNITS DAILY

## 2023-10-28 ENCOUNTER — Ambulatory Visit: Admitting: "Endocrinology

## 2023-10-28 ENCOUNTER — Encounter: Payer: Self-pay | Admitting: "Endocrinology

## 2023-10-28 VITALS — BP 126/80 | HR 94 | Ht 65.0 in | Wt 265.0 lb

## 2023-10-28 DIAGNOSIS — E7800 Pure hypercholesterolemia, unspecified: Secondary | ICD-10-CM | POA: Diagnosis not present

## 2023-10-28 DIAGNOSIS — E1165 Type 2 diabetes mellitus with hyperglycemia: Secondary | ICD-10-CM | POA: Diagnosis not present

## 2023-10-28 DIAGNOSIS — Z7985 Long-term (current) use of injectable non-insulin antidiabetic drugs: Secondary | ICD-10-CM

## 2023-10-28 DIAGNOSIS — Z9641 Presence of insulin pump (external) (internal): Secondary | ICD-10-CM

## 2023-10-28 LAB — POCT GLYCOSYLATED HEMOGLOBIN (HGB A1C): Hemoglobin A1C: 8.3 % — AB (ref 4.0–5.6)

## 2023-10-28 MED ORDER — OMNIPOD 5 DEXG7G6 PODS GEN 5 MISC: 1.0000 | 3 refills | Status: AC

## 2023-10-28 MED ORDER — TIRZEPATIDE 10 MG/0.5ML ~~LOC~~ SOAJ
10.0000 mg | SUBCUTANEOUS | 0 refills | Status: DC
Start: 2023-10-28 — End: 2023-11-04

## 2023-10-28 NOTE — Progress Notes (Signed)
 Outpatient Endocrinology Note Obadiah Birmingham, MD  10/28/23   Katrina Vega 10/08/1955 [id number]  Referring Provider: Antonio Meth, [name], * Primary Care Provider: Antonio Meth, [name], DO Reason for consultation: Subjective   Assessment & Plan  Diagnoses and all orders for this visit:  Uncontrolled type 2 diabetes mellitus with hyperglycemia (HCC) -     Insulin  Disposable Pump (OMNIPOD 5 DEXG7G6 PODS GEN 5) MISC; 1 Device by Does not apply route every other day. Change pod every 1.5 days -     POCT glycosylated hemoglobin (Hb A1C)  Insulin  pump in place  Long-term (current) use of injectable non-insulin  antidiabetic drugs  Pure hypercholesterolemia  Other orders -     tirzepatide  (MOUNJARO ) 10 MG/0.5ML Pen; Inject 10 mg into the skin once a week.   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, +  neuropathy, +  nephropathy Lab Results  Component Value Date   GFR 57.45 (L) 08/17/2023   Hba1c goal less than 7, current Hba1c is  Lab Results  Component Value Date   HGBA1C 8.3 (A) 10/28/2023   Will recommend the following: Running out of pods but reports insurance only pays for pod change in 2 days, prescription written for change every 1.5 days Omnipod 5 with DexCom G6 with Humalog  U100  Basal: 2.3 u/hr I:C 1:3 ISF 1:20 IOB 3 hrs Target 110, correct above 120  Mounjaro  10 mg weekly to help with further weight loss   Previously on, Lantus  60 units once a day Humalog  bolus: 15 min before meals for 1 unit for every 4 grams of carbohydrates  Correction scale: Use in addition to your meal time/short acting insulin  based on blood sugars as follows:  151 - 175: 1 unit 176 - 200: 2 units 201 - 225: 3 units 226 - 250: 4 units 251 - 275: 5 units 276 - 300: 6 units 301 - 325: 7 units 326 - 350: 8 units 351 - 375: 9 units 376 - 400: 10 units   Once pump pods are resumed, pt will stop basal bolus insulin  inj and resume pump after 20 hours of last lantus  shot   Stopped Ozempic 2mg /week (lost 20 lbs initially, then started gaining back) Mounjaro  7.5 mg weekly to help with further weight loss  Omnipod 5 with DexCom G6 with Humalog  U100 Patient reports pod dysfunction after it broke, setup new pod today Basal: 2.3u/hr I:C 1:4 ISF 1:25 IOB 3 hrs Target 110, correct above 120  Jardiance  10 mg every day - stopped taking it due to yeast infections   No history of MEN syndrome/medullary thyroid  cancer/pancreatitis or pancreatic cancer in self or family No known contraindications/side effects to any of above medications Glucagon  discussed and prescribed with refills on 06/22/23   -Last LD and Tg are as follows: Lab Results  Component Value Date   LDLCALC 38 10/09/2023    Lab Results  Component Value Date   TRIG 88 10/09/2023   -On rosuvastatin  40 mg QD -Follow low fat diet and exercise   -Blood pressure goal <140/90 - Microalbumin/creatinine goal is < 30 -Last MA/Cr is as follows: No results found for: MICROALBUR, MALB24HUR  -on ACE/ARB losartan  25 mg every day (prev. on lisinopril  20 mg every day?) -diet changes including salt restriction -limit eating outside -counseled BP targets per standards of diabetes care -uncontrolled blood pressure can lead to retinopathy, nephropathy and cardiovascular and atherosclerotic heart disease  Reviewed and counseled on: -A1C target -Blood sugar targets -Complications  of uncontrolled diabetes  -Checking blood sugar before meals and bedtime and bring log next visit -All medications with mechanism of action and side effects -Hypoglycemia management: rule of 15's, Glucagon  Emergency Kit and medical alert ID -low-carb low-fat plate-method diet -At least 20 minutes of physical activity per day -Annual dilated retinal eye exam and foot exam -compliance and follow up needs -follow up as scheduled or earlier if problem gets worse  Call if blood sugar is less than 70 or consistently above 250     Take a 15 gm snack of carbohydrate at bedtime before you go to sleep if your blood sugar is less than 100.    If you are going to fast after midnight for a test or procedure, ask your physician for instructions on how to reduce/decrease your insulin  dose.    Call if blood sugar is less than 70 or consistently above 250  -Treating a low sugar by rule of 15  (15 gms of sugar every 15 min until sugar is more than 70) If you feel your sugar is low, test your sugar to be sure If your sugar is low (less than 70), then take 15 grams of a fast acting Carbohydrate (3-4 glucose tablets or glucose gel or 4 ounces of juice or regular soda) Recheck your sugar 15 min after treating low to make sure it is more than 70 If sugar is still less than 70, treat again with 15 grams of carbohydrate          Don't drive the hour of hypoglycemia  If unconscious/unable to eat or drink by mouth, use glucagon  injection or nasal spray baqsimi  and call 911. Can repeat again in 15 min if still unconscious.  Return in about 2 months (around 12/28/2023).   I have reviewed current medications, nurse's notes, allergies, vital signs, past medical and surgical history, family medical history, and social history for this encounter. Counseled patient on symptoms, examination findings, lab findings, imaging results, treatment decisions and monitoring and prognosis. The patient understood the recommendations and agrees with the treatment plan. All questions regarding treatment plan were fully answered.  Obadiah Birmingham, MD  10/28/23    History of Present Illness Katrina Vega is a 68 y.o. year old female who presents for follow up on Type II diabetes mellitus.  Katrina Vega was first diagnosed around 2010.   Diabetes education +  Home diabetes regimen: Mounjaro  7.5 mg weekly  Omnipod 5 with DexCom G6 with Humalog  U100  Basal: 2.3u/hr I:C 1:4 ISF 1:25 IOB 3 hrs Target 110, correct above 120  Stopped Ozempic 2mg /week  (lost 20 lbs initially, then started gaining back) Stopped Jardiance  10 mg every day due to yeast infections   COMPLICATIONS +  MI/Stroke -  retinopathy +  neuropathy +  nephropathy  BLOOD SUGAR DATA CGM interpretation: At today's visit, we reviewed her CGM downloads. The full report is scanned in the media. Reviewing the CGM trends, BG are high across the day.  Physical Exam  BP 126/80   Pulse 94   Ht 5' 5 (1.651 m)   Wt 265 lb (120.2 kg)   SpO2 96%   BMI 44.10 kg/m    Constitutional: well developed, well nourished Head: normocephalic, atraumatic Eyes: sclera anicteric, no redness Neck: supple Lungs: normal respiratory effort Neurology: alert and oriented Skin: dry, no appreciable rashes Musculoskeletal: no appreciable defects Psychiatric: normal mood and affect Diabetic Foot Exam - Simple   No data filed  Current Medications Patient's Medications  New Prescriptions   TIRZEPATIDE  (MOUNJARO ) 10 MG/0.5ML PEN    Inject 10 mg into the skin once a week.  Previous Medications   ASPIRIN  EC 81 MG TABLET    Take 1 tablet (81 mg total) by mouth daily. Swallow whole.   BREZTRI  AEROSPHERE 160-9-4.8 MCG/ACT AERO INHALER    INHALE 2 INHALATIONS BY MOUTH  TWICE DAILY TO PREVENT COUGH OR  WHEEZE. RINSE, GARGLE, AND SPIT  AFTER USE   BUDESONIDE  (PULMICORT ) 0.5 MG/2ML NEBULIZER SOLUTION    Take 2 mLs (0.5 mg total) by nebulization in the morning and at bedtime. During respiratory infections for 1-2 weeks at a time.   BUMETANIDE  (BUMEX ) 1 MG TABLET    Take 1 tablet (1 mg total) by mouth daily.   CHOLECALCIFEROL 50 MCG (2000 UT) TABS    Take 2,000 Units by mouth daily.   CLOPIDOGREL  (PLAVIX ) 75 MG TABLET    TAKE 1 TABLET BY MOUTH DAILY   CONTINUOUS BLOOD GLUC TRANSMIT (DEXCOM G6 TRANSMITTER) MISC       CONTINUOUS GLUCOSE SENSOR (DEXCOM G6 SENSOR) MISC    1 Device by Does not apply route continuous.   DIAZEPAM  (VALIUM ) 5 MG TABLET    Take 1 tablet (5 mg total) by mouth every 12  (twelve) hours as needed for anxiety.   DIPHENHYDRAMINE (BENADRYL) 50 MG TABLET    Take 50 mg by mouth daily as needed for itching or allergies.   EPINEPHRINE  (EPIPEN  2-PAK) 0.3 MG/0.3 ML IJ SOAJ INJECTION    Use as directed for severe allergic reactions   FASENRA  PEN 30 MG/ML PREFILLED AUTOINJECTOR    INJECT 30MG  SUBCUTANEOUSLY EVERY 8 WEEKS   GABAPENTIN  (NEURONTIN ) 300 MG CAPSULE    TAKE 1 CAPSULE BY MOUTH 3 TIMES  DAILY   GLUCAGON  (BAQSIMI  ONE PACK) 3 MG/DOSE POWD    Place 1 Device into the nose as needed (Low blood sugar with impaired consciousness).   GLUCOSE 4 GM CHEWABLE TABLET    Chew 1 tablet by mouth once as needed for low blood sugar.   HUMALOG  100 UNIT/ML INJECTION    Inject up too 200 units into pump daily.   HUMALOG  KWIKPEN 100 UNIT/ML KWIKPEN    INJECT SUBCUTANEOUSLY AS  DIRECTED BASED ON CARB AND  CORRECTION RATIO, MAX DOSE 80  UNITS DAILY   INSULIN  GLARGINE (LANTUS  SOLOSTAR) 100 UNIT/ML SOLOSTAR PEN    Inject 55 Units into the skin daily as needed (When unable to use pump).   INSULIN  PEN NEEDLE (PEN NEEDLES) 32G X 4 MM MISC    1 Device by Does not apply route in the morning, at noon, in the evening, and at bedtime.   IPRATROPIUM (ATROVENT ) 0.03 % NASAL SPRAY    Place 1-2 sprays into both nostrils 2 (two) times daily as needed (nasal drainage).   ISOSORBIDE  MONONITRATE (IMDUR ) 60 MG 24 HR TABLET    TAKE 1 TABLET BY MOUTH DAILY   LEVALBUTEROL  (XOPENEX  HFA) 45 MCG/ACT INHALER    USE 2 INHALATIONS BY MOUTH EVERY 4 TO 6 HOURS AS NEEDED FOR  SHORTNESS OF BREATH , COUGH,  WHEEZE AND TIGHTNESS IN CHEST   LEVALBUTEROL  (XOPENEX ) 0.63 MG/3ML NEBULIZER SOLUTION    Take 3 mLs (0.63 mg total) by nebulization every 4 (four) hours as needed for wheezing or shortness of breath (coughing fits).   MECLIZINE  (ANTIVERT ) 25 MG TABLET    Take 1 tablet (25 mg total) by mouth 3 (three) times daily as needed for dizziness.  METHOCARBAMOL  (ROBAXIN ) 500 MG TABLET    Take 1 tablet (500 mg total) by mouth every  6 (six) hours as needed for muscle spasms.   METOPROLOL  TARTRATE (LOPRESSOR ) 50 MG TABLET    TAKE 1 TABLET BY MOUTH EVERY 12  HOURS   MONTELUKAST  (SINGULAIR ) 10 MG TABLET    Take 1 tablet (10 mg total) by mouth at bedtime.   NITROGLYCERIN  (NITROSTAT ) 0.4 MG SL TABLET    Place 1 tablet (0.4 mg total) under the tongue every 5 (five) minutes as needed for chest pain.   PANTOPRAZOLE  (PROTONIX ) 40 MG TABLET    Take 1 tablet (40 mg total) by mouth 2 (two) times daily before a meal.   PREDNISONE  (DELTASONE ) 50 MG TABLET    Take 1 tablet 13 hours prior to procedure, 1 tablet 7 hours prior to procedure, and 1 tablet (with Benedryl 50 mg) prior to going to the hospital for procedure   RESPIRATORY THERAPY SUPPLIES (CARETOUCH 2 CPAP HOSE HANGER) MISC    by Does not apply route.   ROSUVASTATIN  (CRESTOR ) 40 MG TABLET    TAKE 1 TABLET BY MOUTH AT  BEDTIME  Modified Medications   Modified Medication Previous Medication   INSULIN  DISPOSABLE PUMP (OMNIPOD 5 DEXG7G6 PODS GEN 5) MISC Insulin  Disposable Pump (OMNIPOD 5 DEXG7G6 PODS GEN 5) MISC      1 Device by Does not apply route every other day. Change pod every 1.5 days    1 Device by Does not apply route every other day.  Discontinued Medications   TIRZEPATIDE  (MOUNJARO ) 7.5 MG/0.5ML PEN    ADMINISTER 7.5 MG UNDER THE SKIN 1 TIME A WEEK    Allergies Allergies  Allergen Reactions   Contrast Media [Iodinated Contrast Media] Shortness Of Breath and Other (See Comments)    sob, chest tight 2024, small amount in joint injection tolerated well   Morphine Itching   Ioversol    Oxycodone  Itching   Tramadol  Itching and Nausea Only    Past Medical History Past Medical History:  Diagnosis Date   Anemia    Arthritis    Asthma    CAD S/P two-vessel DES PCI 2008   LAD and RI; 2013 PTCA of small RCA.   CHF (congestive heart failure), NYHA class I, chronic, diastolic (HCC) 2019   Recent Echo 12/16/2021: Mild concentric LVH.  EF 59%.  GI 1 DD.  Normal LAP-(Mildly  dilated LA;; has converted from to torsemide  and now on bumetanide    CKD stage 3 due to type 2 diabetes mellitus (HCC)    COPD (chronic obstructive pulmonary disease) (HCC) 2021   Complicated by asthma and seasonal allergies.   Diabetes mellitus, type II, insulin  dependent (HCC) 2010   On insulin  60 units TID Premeal.  Also on Jardiance  and Ozempic.   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  Continue current medications.    Past Surgical History Past Surgical History:  Procedure Laterality  Date   AAA DUPLEX  10/09/2020   Max Aorta (sac) diameter 2.51 cm prox with mild ectasia in Prox Aorta. Diffuse plaque in mid-distal Aorta. Bilateral ICA poorly visulailzed - appear to be Severely stenosed with difuse plaque. - Consider CTA of cath directed Angio.   ABDOMINAL AORTOGRAM N/A 10/08/2023   Procedure: ABDOMINAL AORTOGRAM;  Surgeon: Court Dorn PARAS, MD;  [facility]: [facility] INVASIVE CV LAB;  Service: Cardiovascular;  Laterality: N/A;   APPENDECTOMY  1974   BIOPSY  02/22/2021   Procedure: BIOPSY;  Surgeon: Rollin Dover, MD;  Location: [facility];  Service: Endoscopy;;   CARDIAC CATHETERIZATION     CARPAL TUNNEL RELEASE  2017   CARPAL TUNNEL RELEASE Right 01/01/2023   Procedure: RIGHT CARPAL TUNNEL RELEASE REVISION;  Surgeon: Arlinda Buster, MD;  Location: [facility];  Service: Orthopedics;  Laterality: Right;   CHOLECYSTECTOMY  1997   pt states removed 1997-1998   COLONOSCOPY WITH PROPOFOL   N/A 02/22/2021   Procedure: COLONOSCOPY WITH PROPOFOL ;  Surgeon: Rollin Dover, MD;  Location: [facility];  Service: Endoscopy;  Laterality: N/A;   CORONARY BALLOON ANGIOPLASTY  2013   PTCA of RCA followed by PCI   CORONARY STENT INTERVENTION  2008   Text DES PCI to LAD and RI/OM1; also prox RCA   ESOPHAGOGASTRODUODENOSCOPY (EGD) WITH PROPOFOL  N/A 02/22/2021   Procedure: ESOPHAGOGASTRODUODENOSCOPY (EGD) WITH PROPOFOL ;  Surgeon: Rollin Dover, MD;  Location: [facility];  Service: Endoscopy;  Laterality: N/A;   LEA Dopplers  10/09/2020   R mid & Distal SFA -CTO - dampend flow in R Pop via collaterals - Moderate velocity increase in R PFA. No signficant stenosis in LLE.   R ABI 0.97) - normal. L ABI - 0.91 w/ monophasic flow in L AT -> c/w 11/2019- R SFA new.   LEFT HEART CATH AND CORONARY ANGIOGRAPHY  12/22/2017   Mild LM plaque.  Mild proximal LAD plaque.  High D1-40% ostial.  Patent mid LAD DES (Taxus from 2008), large distal LAD free disease; Prox LCx-OM1 stents patent (Taxus 2008). Small RCA - patent prox stent(~50-60% ISR), PTCA site from 2013 - patent   LOWER EXTREMITY ANGIOGRAM Bilateral 12/22/2017   Bilateral SFA stents with evidence of severe right and mid left ISR bilateral popliteal arteries proximal trifurcation vessels are patent.  Moderate to severe lesion involving mid R AntTib, poor distal flow in L Ant Tib - 2 V runoff Bilat. --> referred for R SFA Laser Atherectomy & DCB 5 x 120 mm.   LOWER EXTREMITY ANGIOGRAPHY N/A 10/08/2023   Procedure: Lower Extremity Angiography;  Surgeon: Court Dorn PARAS, MD;  Location: [facility] INVASIVE CV LAB;  Service: Cardiovascular;  Laterality: N/A;   LOWER EXTREMITY INTERVENTION Right 12/22/2017   Laser atherectomy of right SFA followed by DCB with 5.0 x 120 mm impact.-For severe right SFA ISR   LOWER EXTREMITY INTERVENTION  10/08/2023   Procedure: LOWER EXTREMITY INTERVENTION;  Surgeon: Court Dorn PARAS, MD;  Location: [facility] INVASIVE CV LAB;  Service:  Cardiovascular;;   LUMBAR SPINE SURGERY  2010   NM MYOVIEW  LTD  07/07/2019   [facility]): [place].  Nondiagnostic EKG.  Dyspnea with effusion.  No ischemia or infarction.  Soft tissue attenuation noted.SABRA  LVEF 72%.  No RWMA.  LOW RISK.--No change from 11/2017   POLYPECTOMY  02/22/2021   Procedure: POLYPECTOMY;  Surgeon: Rollin Dover, MD;  Location: [facility];  Service: Endoscopy;;   REPLACEMENT TOTAL KNEE  2017 and 2018   TONSILLECTOMY  TOTAL ABDOMINAL HYSTERECTOMY  1989   TOTAL HIP ARTHROPLASTY  04/2018   TOTAL SHOULDER ARTHROPLASTY  11/2019   TRANSTHORACIC ECHOCARDIOGRAM  07/04/2019   Normal LV size, mild LVH, hyperdynamic LVEF at >65% with grade 1 diastolic dysfunction.  Otherwise no other significant abnormality.  Poor quality due to patient body habitus.   TRANSTHORACIC ECHOCARDIOGRAM  12/16/2021   [facility]) normal LV size and function.  Moderate concentric LVH.  Normal WM.  EF estimated 59%.  GR 1 DD.  Mild LA dilation.  No valvular lesions.    Family History family history includes Breast cancer in her mother; Eczema in her grandson; Heart attack in her father; Heart disease in her mother; Lung cancer in her father.  Social History Social History   Socioeconomic History   Marital status: Single    Spouse name: Not on file   Number of children: 5   Years of education: Not on file   Highest education level: Associate degree: academic program  Occupational History   Not on file  Tobacco Use   Smoking status: Former    Current packs/day: 0.00    Average packs/day: 0.5 packs/day for 15.0 years (7.5 ttl pk-yrs)    Types: Cigarettes    Start date: 10/13/1980    Quit date: 10/14/1995    Years since quitting: 28.0    Passive exposure: Current   Smokeless tobacco: Never  Vaping Use   Vaping status: Never Used  Substance and Sexual Activity   Alcohol use: Not Currently   Drug use: Not Currently   Sexual activity: Yes   Other Topics Concern   Not on file  Social History Narrative   Not on file   Social Drivers of Health   Financial Resource Strain: Low Risk  (08/15/2023)   Overall Financial Resource Strain (CARDIA)    Difficulty of Paying Living Expenses: Not very hard  Food Insecurity: No Food Insecurity (10/08/2023)   Hunger Vital Sign    Worried About Running Out of Food in the Last Year: Never true    Ran Out of Food in the Last Year: Never true  Transportation Needs: No Transportation Needs (10/08/2023)   PRAPARE - Administrator, Civil Service (Medical): No    Lack of Transportation (Non-Medical): No  Physical Activity: Insufficiently Active (08/15/2023)   Exercise Vital Sign    Days of Exercise per Week: 1 day    Minutes of Exercise per Session: 20 min  Stress: No Stress Concern Present (08/15/2023)   [employer] of Occupational Health - Occupational Stress Questionnaire    Feeling of Stress: Only a little  Social Connections: Moderately Integrated (10/08/2023)   Social Connection and Isolation Panel    Frequency of Communication with Friends and Family: Three times a week    Frequency of Social Gatherings with Friends and Family: Once a week    Attends Religious Services: More than 4 times per year    Active Member of [employer] or Organizations: Yes    Attends Banker Meetings: More than 4 times per year    Marital Status: Never married  Intimate Partner Violence: Unknown (10/08/2023)   Humiliation, Afraid, Rape, and Kick questionnaire    Fear of Current or Ex-Partner: No    Emotionally Abused: No    Physically Abused: Not on file    Sexually Abused: No    Lab Results  Component Value Date   HGBA1C 8.3 (A) 10/28/2023   HGBA1C 8.2 (A) 06/22/2023  HGBA1C 7.5 11/04/2022   Lab Results  Component Value Date   CHOL 84 10/09/2023   Lab Results  Component Value Date   HDL 28 (L) 10/09/2023   Lab Results  Component Value Date   LDLCALC 38 10/09/2023    Lab Results  Component Value Date   TRIG 88 10/09/2023   Lab Results  Component Value Date   CHOLHDL 3.0 10/09/2023   Lab Results  Component Value Date   CREATININE 1.29 (H) 10/09/2023   Lab Results  Component Value Date   GFR 57.45 (L) 08/17/2023   No results found for: MACKEY CURRENT     Component Value Date/Time   NA 137 10/09/2023 0352   NA 139 09/30/2023 1448   K 3.9 10/09/2023 0352   CL 107 10/09/2023 0352   CO2 25 10/09/2023 0352   GLUCOSE 121 (H) 10/09/2023 0352   BUN 14 10/09/2023 0352   BUN 12 09/30/2023 1448   CREATININE 1.29 (H) 10/09/2023 0352   CREATININE 1.02 (H) 05/04/2023 1432   CALCIUM  9.6 10/09/2023 0352   PROT 6.9 08/17/2023 1218   ALBUMIN  4.4 08/17/2023 1218   AST 13 08/17/2023 1218   AST 19 05/04/2023 1432   ALT 13 08/17/2023 1218   ALT 22 05/04/2023 1432   ALKPHOS 63 08/17/2023 1218   BILITOT 0.5 08/17/2023 1218   BILITOT 0.4 05/04/2023 1432   GFRNONAA 45 (L) 10/09/2023 0352   GFRNONAA 60 (L) 05/04/2023 1432      Latest Ref Rng & Units 10/09/2023    3:52 AM 09/30/2023    2:48 PM 08/17/2023   12:18 PM  BMP  Glucose 70 - 99 mg/dL 878  846  854   BUN 8 - 23 mg/dL 14  12  12    Creatinine 0.44 - 1.00 mg/dL 8.70  8.71  8.99   BUN/Creat Ratio 12 - 28  9    Sodium 135 - 145 mmol/L 137  139  139   Potassium 3.5 - 5.1 mmol/L 3.9  4.4  4.2   Chloride 98 - 111 mmol/L 107  102  104   CO2 22 - 32 mmol/L 25  20  27    Calcium  8.9 - 10.3 mg/dL 9.6  89.9  89.8        Component Value Date/Time   WBC 9.2 10/09/2023 0352   RBC 4.50 10/09/2023 0352   HGB 11.5 (L) 10/09/2023 0352   HGB 12.6 09/30/2023 1448   HCT 37.1 10/09/2023 0352   HCT 40.3 09/30/2023 1448   PLT 269 10/09/2023 0352   PLT 291 09/30/2023 1448   MCV 82.4 10/09/2023 0352   MCV 83 09/30/2023 1448   MCH 25.6 (L) 10/09/2023 0352   MCHC 31.0 10/09/2023 0352   RDW 16.8 (H) 10/09/2023 0352   RDW 15.5 (H) 09/30/2023 1448   LYMPHSABS 2.3 09/30/2023 1448   MONOABS 0.6  09/29/2023 1410   EOSABS 0.0 09/30/2023 1448   BASOSABS 0.0 09/30/2023 1448     Parts of this note may have been dictated using voice recognition software. There may be variances in spelling and vocabulary which are unintentional. Not all errors are proofread. Please notify the dino if any discrepancies are noted or if the meaning of any statement is not clear.

## 2023-10-29 ENCOUNTER — Encounter: Payer: Self-pay | Admitting: "Endocrinology

## 2023-10-29 ENCOUNTER — Ambulatory Visit

## 2023-10-29 ENCOUNTER — Telehealth: Payer: Self-pay

## 2023-10-29 DIAGNOSIS — J4550 Severe persistent asthma, uncomplicated: Secondary | ICD-10-CM

## 2023-10-29 NOTE — Telephone Encounter (Signed)
 Patient was identified as falling into the [place] Measure - Diabetes.   Patient was: Appointment already scheduled for:  f/u with Endo on 10/28/23. Katrina Vega

## 2023-11-02 ENCOUNTER — Other Ambulatory Visit

## 2023-11-02 ENCOUNTER — Ambulatory Visit (INDEPENDENT_AMBULATORY_CARE_PROVIDER_SITE_OTHER): Admitting: Orthopedic Surgery

## 2023-11-02 ENCOUNTER — Ambulatory Visit: Admitting: Family

## 2023-11-02 DIAGNOSIS — G5602 Carpal tunnel syndrome, left upper limb: Secondary | ICD-10-CM | POA: Diagnosis not present

## 2023-11-02 DIAGNOSIS — M1812 Unilateral primary osteoarthritis of first carpometacarpal joint, left hand: Secondary | ICD-10-CM

## 2023-11-02 MED ORDER — LIDOCAINE HCL 1 % IJ SOLN
1.0000 mL | INTRAMUSCULAR | Status: AC | PRN
  Administered 2023-11-02: 1 mL

## 2023-11-02 MED ORDER — BETAMETHASONE SOD PHOS & ACET 6 (3-3) MG/ML IJ SUSP
6.0000 mg | INTRAMUSCULAR | Status: AC | PRN
  Administered 2023-11-02: 6 mg via INTRA_ARTICULAR

## 2023-11-02 NOTE — Progress Notes (Signed)
   Katrina Vega - 68 y.o. female MRN [id number]  Date of birth: 03-05-1955  Office Visit Note: Visit Date: 11/02/2023 PCP: Antonio Cyndee Jamee JONELLE, DO Referred by: Antonio Cyndee Jamee R, *  Subjective:  HPI: Katrina Vega is a 68 y.o. female who returns today for evaluation of ongoing pain at the basilar aspect of the left thumb with associated swelling.  She has a recent history of right open carpal tunnel revision with subsequent irrigation debridement, she is recovering well from the right side.   She has undergone previous injection to the left thumb CMC with moderate relief.  Is stating that her left hand swelling with associated numbness and tingling is progressing.  She does have a history of prior left open carpal tunnel release done 10 to 15 years prior, similar to the right side.  Pertinent ROS were reviewed with the patient and found to be negative unless otherwise specified above in HPI.   Assessment & Plan: Visit Diagnoses:  1. Carpal tunnel syndrome, left upper limb   2. Arthritis of carpometacarpal (CMC) joint of left thumb      Plan: Extensive discussion was had with the patient today regarding her ongoing left thumb CMC osteoarthritis.  X-rays were reviewed in detail today which do show degenerative change at the left thumb [facility] articulation with moderate joint subluxation.  We discussed treatment modalities ranging from conservative to surgical.  From a conservative standpoint we discussed bracing, injections, anti-inflammatory medications and activity modification.  From a surgical standpoint we discussed the possibility of [facility] arthroplasty, risks and benefits as well as the postoperative protocol.  We also discussed the likelihood for recurrent carpal tunnel in this region.  Today, I recommended that we perform left wrist carpal tunnel cortisone injection for both diagnostic and therapeutic value.  Once again, risks and benefits of the injection were discussed in detail,  patient elected to proceed.  Injection was provided today without incident.  Follow-up in approximate 6 weeks.  New wrist brace was given as well for nocturnal symptoms.   Follow-up: No follow-ups on file.   Meds & Orders: No orders of the defined types were placed in this encounter.   No orders of the defined types were placed in this encounter.    Procedures: Hand/UE Inj: L carpal tunnel for carpal tunnel syndrome on 11/02/2023 12:53 PM Indications: pain and diagnostic Details: 25 G needle, volar approach Medications: 1 mL lidocaine  1 %; 6 mg betamethasone  acetate-betamethasone  sodium phosphate  6 (3-3) MG/ML Outcome: tolerated well, no immediate complications Procedure, treatment alternatives, risks and benefits explained, specific risks discussed.           Objective:   Vital Signs: There were no vitals taken for this visit.  Ortho Exam Right upper extremity incision is appropriately healing, no evidence of erythema, no active drainage on examination today Wrist range of motion is 60 degrees flexion, 50 degrees extension composite fist without restriction  sensation is intact to light touch, hypersensitivity improved around surgical site  Left hand: - Tenderness associated at the thumb CMC interval, positive grind for pain and crepitus, minimal MP hyperextension - Sensation is slightly diminished in the median nerve distribution, AIN/PIN/interosseous intact - Positive Tinel's over the carpal tunnel region, positive Phalen's - Well-healed previous incision at the volar aspect of the hand from previous carpal tunnel release  Imaging: No results found.    Menucha Dicesare Afton Alderton, M.D. [facility], Hand Surgery

## 2023-11-03 ENCOUNTER — Ambulatory Visit: Attending: Cardiovascular Disease | Admitting: Cardiovascular Disease

## 2023-11-03 ENCOUNTER — Encounter: Payer: Self-pay | Admitting: Cardiovascular Disease

## 2023-11-03 VITALS — BP 114/69 | HR 91 | Ht 65.0 in | Wt 269.0 lb

## 2023-11-03 DIAGNOSIS — I739 Peripheral vascular disease, unspecified: Secondary | ICD-10-CM | POA: Diagnosis not present

## 2023-11-03 NOTE — Patient Instructions (Signed)
 Medication Instructions:  Your physician recommends that you continue on your current medications as directed. Please refer to the Current Medication list given to you today.  *If you need a refill on your cardiac medications before your next appointment, please call your pharmacy*   Follow-Up: At [facility], you and your health needs are our priority.  As part of our continuing mission to provide you with exceptional heart care, our providers are all part of one team.  This team includes your primary Cardiologist (physician) and Advanced Practice Providers or APPs (Physician Assistants and Nurse Practitioners) who all work together to provide you with the care you need, when you need it.  Your next appointment:   3 month(s)  Provider:   Dorn Lesches, MD

## 2023-11-03 NOTE — Assessment & Plan Note (Signed)
 Katrina Vega was sent to me by Dr. Anner for PAD.  She has had stenting of her right and left leg back in 2008 and 2019 respectively.  She has had bilateral calf claudication left greater than right which did not respond to Pletal .  I performed angiography on her 10/08/2023 revealing patent bilateral SFA stents with moderately severe in-stent restenosis left greater than right.  I performed drug-coated balloon angioplasty of the left SFA in-stent restenosis with a 6 mm x 60 mm long DCB.  I got an excellent result.  Her Dopplers improved and her claudication on that side resolved.

## 2023-11-03 NOTE — Progress Notes (Signed)
 11/03/2023 Katrina Vega   03-Jul-1955  [id number]  Primary Physician Antonio Cyndee Jamee JONELLE, DO Primary Cardiologist: Dorn JINNY Lesches MD [facility], [place], [place]  HPI:  Katrina Vega is a 68 y.o.   68 year old moderately overweight single African-American female mother of 5, grandmother of 9 grandchildren referred by Dr. Anner for evaluation of PAD.  She is retired from Pharmacist, community at [employer] .  I last saw her in the office 09/01/2023.  Her risk factors include remote tobacco abuse, treated hypertension, diabetes and hyperlipidemia.  She has never had a stroke but did have coronary stenting in [place] in 2008 and apparently had a heart heart attack in 2015.  She had stenting of her right leg in 2008 and I think her left leg in 2019.  She does complain of dyspnea.  Over the last 3 months she has noticed increasing lower extremity claudication left greater than right.  She had Dopplers that revealed stable ABIs compared to 3 years ago with moderate disease in her proximal right SFA and a high-grade lesion in her left popliteal artery.   Since I saw her 2 months ago I did begin her on Pletal  which really has not changed her symptoms significantly.  She still has left greater than right lower extremity lifestyle-limiting claudication and wishes to proceed with angiography potential endovascular therapy.  Of note, she does have a contrast allergy .  I performed peripheral angiography on her 10/08/2023 revealing patent bilateral SFA stents with moderately severe in-stent restenosis left greater than right.  I performed DCB of the left in-stent restenosis with an excellent result.  Follow-up Dopplers improved and her claudication has resolved.  She still has mild right calf claudication.   Current Meds  Medication Sig   aspirin  EC 81 MG tablet Take 1 tablet (81 mg total) by mouth daily. Swallow whole.   BREZTRI  AEROSPHERE 160-9-4.8 MCG/ACT AERO inhaler INHALE 2 INHALATIONS BY  MOUTH  TWICE DAILY TO PREVENT COUGH OR  WHEEZE. RINSE, GARGLE, AND SPIT  AFTER USE   budesonide  (PULMICORT ) 0.5 MG/2ML nebulizer solution Take 2 mLs (0.5 mg total) by nebulization in the morning and at bedtime. During respiratory infections for 1-2 weeks at a time. (Patient taking differently: Take 0.5 mg by nebulization 2 (two) times daily as needed (Asthma). During respiratory infections for 1-2 weeks at a time.)   bumetanide  (BUMEX ) 1 MG tablet Take 1 tablet (1 mg total) by mouth daily. (Patient taking differently: Take 1 mg by mouth daily as needed (Fluid).)   Cholecalciferol 50 MCG (2000 UT) TABS Take 2,000 Units by mouth daily.   clopidogrel  (PLAVIX ) 75 MG tablet TAKE 1 TABLET BY MOUTH DAILY   Continuous Blood Gluc Transmit (DEXCOM G6 TRANSMITTER) MISC    Continuous Glucose Sensor (DEXCOM G6 SENSOR) MISC 1 Device by Does not apply route continuous.   diazepam  (VALIUM ) 5 MG tablet Take 1 tablet (5 mg total) by mouth every 12 (twelve) hours as needed for anxiety.   diphenhydrAMINE (BENADRYL) 50 MG tablet Take 50 mg by mouth daily as needed for itching or allergies.   EPINEPHrine  (EPIPEN  2-PAK) 0.3 mg/0.3 mL IJ SOAJ injection Use as directed for severe allergic reactions (Patient taking differently: Inject 0.3 mg into the muscle as needed. Use as directed for severe allergic reactions)   FASENRA  PEN 30 MG/ML prefilled autoinjector INJECT 30MG  SUBCUTANEOUSLY EVERY 8 WEEKS   gabapentin  (NEURONTIN ) 300 MG capsule TAKE 1 CAPSULE BY MOUTH 3 TIMES  DAILY  Glucagon  (BAQSIMI  ONE PACK) 3 MG/DOSE POWD Place 1 Device into the nose as needed (Low blood sugar with impaired consciousness).   glucose 4 GM chewable tablet Chew 1 tablet by mouth once as needed for low blood sugar.   HUMALOG  100 UNIT/ML injection Inject up too 200 units into pump daily.   HUMALOG  KWIKPEN 100 UNIT/ML KwikPen INJECT SUBCUTANEOUSLY AS  DIRECTED BASED ON CARB AND  CORRECTION RATIO, MAX DOSE 80  UNITS DAILY   Insulin  Disposable Pump  (OMNIPOD 5 DEXG7G6 PODS GEN 5) MISC 1 Device by Does not apply route every other day. Change pod every 1.5 days   insulin  glargine (LANTUS  SOLOSTAR) 100 UNIT/ML Solostar Pen Inject 55 Units into the skin daily as needed (When unable to use pump).   Insulin  Pen Needle (PEN NEEDLES) 32G X 4 MM MISC 1 Device by Does not apply route in the morning, at noon, in the evening, and at bedtime.   ipratropium (ATROVENT ) 0.03 % nasal spray Place 1-2 sprays into both nostrils 2 (two) times daily as needed (nasal drainage).   isosorbide  mononitrate (IMDUR ) 60 MG 24 hr tablet TAKE 1 TABLET BY MOUTH DAILY   levalbuterol  (XOPENEX  HFA) 45 MCG/ACT inhaler USE 2 INHALATIONS BY MOUTH EVERY 4 TO 6 HOURS AS NEEDED FOR  SHORTNESS OF BREATH , COUGH,  WHEEZE AND TIGHTNESS IN CHEST   levalbuterol  (XOPENEX ) 0.63 MG/3ML nebulizer solution Take 3 mLs (0.63 mg total) by nebulization every 4 (four) hours as needed for wheezing or shortness of breath (coughing fits).   meclizine  (ANTIVERT ) 25 MG tablet Take 1 tablet (25 mg total) by mouth 3 (three) times daily as needed for dizziness.   methocarbamol  (ROBAXIN ) 500 MG tablet Take 1 tablet (500 mg total) by mouth every 6 (six) hours as needed for muscle spasms.   metoprolol  tartrate (LOPRESSOR ) 50 MG tablet TAKE 1 TABLET BY MOUTH EVERY 12  HOURS   montelukast  (SINGULAIR ) 10 MG tablet Take 1 tablet (10 mg total) by mouth at bedtime.   nitroGLYCERIN  (NITROSTAT ) 0.4 MG SL tablet Place 1 tablet (0.4 mg total) under the tongue every 5 (five) minutes as needed for chest pain.   pantoprazole  (PROTONIX ) 40 MG tablet Take 1 tablet (40 mg total) by mouth 2 (two) times daily before a meal.   Respiratory Therapy Supplies (CARETOUCH 2 CPAP HOSE HANGER) MISC by Does not apply route.   rosuvastatin  (CRESTOR ) 40 MG tablet TAKE 1 TABLET BY MOUTH AT  BEDTIME   tirzepatide  (MOUNJARO ) 10 MG/0.5ML Pen Inject 10 mg into the skin once a week.   Current Facility-Administered Medications for the 11/03/23  encounter (Office Visit) with Court Dorn PARAS, MD  Medication   Benralizumab  SOSY 30 mg     Allergies  Allergen Reactions   Contrast Media [Iodinated Contrast Media] Shortness Of Breath and Other (See Comments)    sob, chest tight 2024, small amount in joint injection tolerated well   Morphine Itching   Ioversol    Oxycodone  Itching   Tramadol  Itching and Nausea Only    Social History   Socioeconomic History   Marital status: Single    Spouse name: Not on file   Number of children: 5   Years of education: Not on file   Highest education level: Associate degree: academic program  Occupational History   Not on file  Tobacco Use   Smoking status: Former    Current packs/day: 0.00    Average packs/day: 0.5 packs/day for 15.0 years (7.5 ttl pk-yrs)  Types: Cigarettes    Start date: 10/13/1980    Quit date: 10/14/1995    Years since quitting: 28.0    Passive exposure: Current   Smokeless tobacco: Never  Vaping Use   Vaping status: Never Used  Substance and Sexual Activity   Alcohol use: Not Currently   Drug use: Not Currently   Sexual activity: Yes  Other Topics Concern   Not on file  Social History Narrative   Not on file   Social Drivers of Health   Financial Resource Strain: Low Risk  (08/15/2023)   Overall Financial Resource Strain (CARDIA)    Difficulty of Paying Living Expenses: Not very hard  Food Insecurity: No Food Insecurity (10/08/2023)   Hunger Vital Sign    Worried About Running Out of Food in the Last Year: Never true    Ran Out of Food in the Last Year: Never true  Transportation Needs: No Transportation Needs (10/08/2023)   PRAPARE - Administrator, Civil Service (Medical): No    Lack of Transportation (Non-Medical): No  Physical Activity: Insufficiently Active (08/15/2023)   Exercise Vital Sign    Days of Exercise per Week: 1 day    Minutes of Exercise per Session: 20 min  Stress: No Stress Concern Present (08/15/2023)   [employer] of Occupational Health - Occupational Stress Questionnaire    Feeling of Stress: Only a little  Social Connections: Moderately Integrated (10/08/2023)   Social Connection and Isolation Panel    Frequency of Communication with Friends and Family: Three times a week    Frequency of Social Gatherings with Friends and Family: Once a week    Attends Religious Services: More than 4 times per year    Active Member of [employer] or Organizations: Yes    Attends Banker Meetings: More than 4 times per year    Marital Status: Never married  Intimate Partner Violence: Unknown (10/08/2023)   Humiliation, Afraid, Rape, and Kick questionnaire    Fear of Current or Ex-Partner: No    Emotionally Abused: No    Physically Abused: Not on file    Sexually Abused: No     Review of Systems: General: negative for chills, fever, night sweats or weight changes.  Cardiovascular: negative for chest pain, dyspnea on exertion, edema, orthopnea, palpitations, paroxysmal nocturnal dyspnea or shortness of breath Dermatological: negative for rash Respiratory: negative for cough or wheezing Urologic: negative for hematuria Abdominal: negative for nausea, vomiting, diarrhea, bright red blood per rectum, melena, or hematemesis Neurologic: negative for visual changes, syncope, or dizziness All other systems reviewed and are otherwise negative except as noted above.    Blood pressure 114/69, pulse 91, height 5' 5 (1.651 m), weight 269 lb (122 kg), SpO2 96%.  General appearance: alert and no distress Neck: no adenopathy, no carotid bruit, no JVD, supple, symmetrical, trachea midline, and thyroid  not enlarged, symmetric, no tenderness/mass/nodules Lungs: clear to auscultation bilaterally Heart: regular rate and rhythm, S1, S2 normal, no murmur, click, rub or gallop Extremities: extremities normal, atraumatic, no cyanosis or edema Pulses: 2+ and symmetric Skin: Skin color, texture, turgor normal. No  rashes or lesions Neurologic: Grossly normal  EKG not performed today      ASSESSMENT AND PLAN:   Claudication in peripheral vascular disease (HCC) Ms. Zilberman was sent to me by Dr. Anner for PAD.  She has had stenting of her right and left leg back in 2008 and 2019 respectively.  She has had bilateral calf claudication left  greater than right which did not respond to Pletal .  I performed angiography on her 10/08/2023 revealing patent bilateral SFA stents with moderately severe in-stent restenosis left greater than right.  I performed drug-coated balloon angioplasty of the left SFA in-stent restenosis with a 6 mm x 60 mm long DCB.  I got an excellent result.  Her Dopplers improved and her claudication on that side resolved.      Dorn DOROTHA Lesches MD FACP,FACC,FAHA, [facility] 11/03/2023 3:48 PM

## 2023-11-04 ENCOUNTER — Encounter: Payer: Self-pay | Admitting: Allergy

## 2023-11-04 ENCOUNTER — Ambulatory Visit (INDEPENDENT_AMBULATORY_CARE_PROVIDER_SITE_OTHER)

## 2023-11-04 ENCOUNTER — Encounter: Payer: Self-pay | Admitting: "Endocrinology

## 2023-11-04 ENCOUNTER — Telehealth: Payer: Self-pay | Admitting: Family Medicine

## 2023-11-04 ENCOUNTER — Encounter: Payer: Self-pay | Admitting: Podiatry

## 2023-11-04 VITALS — BP 114/69 | Ht 65.0 in | Wt 269.0 lb

## 2023-11-04 DIAGNOSIS — Z0000 Encounter for general adult medical examination without abnormal findings: Secondary | ICD-10-CM

## 2023-11-04 DIAGNOSIS — Z2821 Immunization not carried out because of patient refusal: Secondary | ICD-10-CM

## 2023-11-04 DIAGNOSIS — E1165 Type 2 diabetes mellitus with hyperglycemia: Secondary | ICD-10-CM

## 2023-11-04 MED ORDER — TIRZEPATIDE 10 MG/0.5ML ~~LOC~~ SOAJ: 10.0000 mg | SUBCUTANEOUS | 0 refills | Status: DC

## 2023-11-04 NOTE — Patient Instructions (Signed)
 [name],  Thank you for taking the time for your Medicare Wellness Visit. I appreciate your continued commitment to your health goals. Please review the care plan we discussed, and feel free to reach out if I can assist you further.  Medicare recommends these wellness visits once per year to help you and your care team stay ahead of potential health issues. These visits are designed to focus on prevention, allowing your provider to concentrate on managing your acute and chronic conditions during your regular appointments.  Please note that Annual Wellness Visits do not include a physical exam. Some assessments may be limited, especially if the visit was conducted virtually. If needed, we may recommend a separate in-person follow-up with your provider.  Ongoing Care Seeing your primary care provider every 3 to 6 months helps us  monitor your health and provide consistent, personalized care.   Referrals If a referral was made during today's visit and you haven't received any updates within two weeks, please contact the referred provider directly to check on the status.  Recommended Screenings:  Health Maintenance  Topic Date Due   Yearly kidney health urinalysis for diabetes  Never done   Eye exam for diabetics  04/08/2023   Medicare Annual Wellness Visit  07/30/2023   Flu Shot  09/25/2023   COVID-19 Vaccine (3 - 2025-26 season) 10/26/2023   Pneumococcal Vaccine for age over 69 (2 of 2 - PCV) 05/11/2024*   Hemoglobin A1C  04/26/2024   Complete foot exam   08/02/2024   Mammogram  09/17/2024   Yearly kidney function blood test for diabetes  10/08/2024   Colon Cancer Screening  02/23/2031   DTaP/Tdap/Td vaccine (3 - Td or Tdap) 03/24/2032   DEXA scan (bone density measurement)  Completed   Hepatitis C Screening  Completed   Zoster (Shingles) Vaccine  Completed   HPV Vaccine  Aged Out   Meningitis B Vaccine  Aged Out  *Topic was postponed. The date shown is not the original due date.        11/04/2023    3:05 PM  Advanced Directives  Does Patient Have a Medical Advance Directive? No  Would patient like information on creating a medical advance directive? No - Patient declined   Advance Care Planning is important because it: Ensures you receive medical care that aligns with your values, goals, and preferences. Provides guidance to your family and loved ones, reducing the emotional burden of decision-making during critical moments.  Vision: Annual vision screenings are recommended for early detection of glaucoma, cataracts, and diabetic retinopathy. These exams can also reveal signs of chronic conditions such as diabetes and high blood pressure.  Dental: Annual dental screenings help detect early signs of oral cancer, gum disease, and other conditions linked to overall health, including heart disease and diabetes.  Please see the attached documents for additional preventive care recommendations.

## 2023-11-04 NOTE — Telephone Encounter (Signed)
 Copied from CRM [id number]. Topic: Appointments - Scheduling Inquiry for Clinic >> Nov 04, 2023  1:31 PM Katrina Vega wrote: Reason for CRM: Patient called and advised that no one called her today for her AWV today at 1:10.

## 2023-11-04 NOTE — Progress Notes (Signed)
 Because this visit was a virtual/telehealth visit,  certain criteria was not obtained, such a blood pressure, CBG if applicable, and timed get up and go. Any medications not marked as taking were not mentioned during the medication reconciliation part of the visit. Any vitals not documented were not able to be obtained due to this being a telehealth visit or patient was unable to self-report a recent blood pressure reading due to a lack of equipment at home via telehealth. Vitals that have been documented are verbally provided by the patient.  This visit was performed by a medical professional under my direct supervision. I was immediately available for consultation/collaboration. I have reviewed and agree with the Annual Wellness Visit documentation.  Subjective:   Katrina Vega is a 68 y.o. who presents for a Medicare Wellness preventive visit.  As a reminder, Annual Wellness Visits don't include a physical exam, and some assessments may be limited, especially if this visit is performed virtually. We may recommend an in-person follow-up visit with your provider if needed.  Visit Complete: Virtual I connected with  Katrina Vega on 11/04/23 by a audio enabled telemedicine application and verified that I am speaking with the correct person using two identifiers.  Patient Location: Home  Provider Location: Home Office  I discussed the limitations of evaluation and management by telemedicine. The patient expressed understanding and agreed to proceed.  Vital Signs: Because this visit was a virtual/telehealth visit, some criteria may be missing or patient reported. Any vitals not documented were not able to be obtained and vitals that have been documented are patient reported.  VideoDeclined- This patient declined Librarian, academic. Therefore the visit was completed with audio only.  Persons Participating in Visit: Patient.  AWV Questionnaire: Yes: Patient Medicare  AWV questionnaire was completed by the patient on 10/28/2023; I have confirmed that all information answered by patient is correct and no changes since this date.  Cardiac Risk Factors include: advanced age (>41men, >42 women);diabetes mellitus;obesity (BMI >30kg/m2);hypertension     Objective:    Today's Vitals   11/04/23 1506  BP: 114/69  Weight: 269 lb (122 kg)  Height: 5' 5 (1.651 m)   Body mass index is 44.76 kg/m.     11/04/2023    3:05 PM 10/08/2023    6:50 AM 09/17/2023    2:43 PM 05/20/2023    3:41 PM 05/04/2023    3:00 PM 03/11/2023    5:08 PM 02/03/2023    9:48 AM  Advanced Directives  Does Patient Have a Medical Advance Directive? No No No No No No No  Copy of Healthcare Power of Attorney in Chart?       No - copy requested  Would patient like information on creating a medical advance directive? No - Patient declined No - Patient declined No - Patient declined No - Patient declined No - Patient declined No - Patient declined No - Patient declined    Current Medications (verified) Outpatient Encounter Medications as of 11/04/2023  Medication Sig   aspirin  EC 81 MG tablet Take 1 tablet (81 mg total) by mouth daily. Swallow whole.   BREZTRI  AEROSPHERE 160-9-4.8 MCG/ACT AERO inhaler INHALE 2 INHALATIONS BY MOUTH  TWICE DAILY TO PREVENT COUGH OR  WHEEZE. RINSE, GARGLE, AND SPIT  AFTER USE   budesonide  (PULMICORT ) 0.5 MG/2ML nebulizer solution Take 2 mLs (0.5 mg total) by nebulization in the morning and at bedtime. During respiratory infections for 1-2 weeks at a time. (Patient taking differently:  Take 0.5 mg by nebulization 2 (two) times daily as needed (Asthma). During respiratory infections for 1-2 weeks at a time.)   bumetanide  (BUMEX ) 1 MG tablet Take 1 tablet (1 mg total) by mouth daily. (Patient taking differently: Take 1 mg by mouth daily as needed (Fluid).)   Cholecalciferol 50 MCG (2000 UT) TABS Take 2,000 Units by mouth daily.   clopidogrel  (PLAVIX ) 75 MG tablet TAKE  1 TABLET BY MOUTH DAILY   Continuous Blood Gluc Transmit (DEXCOM G6 TRANSMITTER) MISC    Continuous Glucose Sensor (DEXCOM G6 SENSOR) MISC 1 Device by Does not apply route continuous.   diazepam  (VALIUM ) 5 MG tablet Take 1 tablet (5 mg total) by mouth every 12 (twelve) hours as needed for anxiety.   diphenhydrAMINE (BENADRYL) 50 MG tablet Take 50 mg by mouth daily as needed for itching or allergies.   EPINEPHrine  (EPIPEN  2-PAK) 0.3 mg/0.3 mL IJ SOAJ injection Use as directed for severe allergic reactions (Patient taking differently: Inject 0.3 mg into the muscle as needed. Use as directed for severe allergic reactions)   FASENRA  PEN 30 MG/ML prefilled autoinjector INJECT 30MG  SUBCUTANEOUSLY EVERY 8 WEEKS   gabapentin  (NEURONTIN ) 300 MG capsule TAKE 1 CAPSULE BY MOUTH 3 TIMES  DAILY   Glucagon  (BAQSIMI  ONE PACK) 3 MG/DOSE POWD Place 1 Device into the nose as needed (Low blood sugar with impaired consciousness).   glucose 4 GM chewable tablet Chew 1 tablet by mouth once as needed for low blood sugar.   HUMALOG  100 UNIT/ML injection Inject up too 200 units into pump daily.   HUMALOG  KWIKPEN 100 UNIT/ML KwikPen INJECT SUBCUTANEOUSLY AS  DIRECTED BASED ON CARB AND  CORRECTION RATIO, MAX DOSE 80  UNITS DAILY   Insulin  Disposable Pump (OMNIPOD 5 DEXG7G6 PODS GEN 5) MISC 1 Device by Does not apply route every other day. Change pod every 1.5 days   insulin  glargine (LANTUS  SOLOSTAR) 100 UNIT/ML Solostar Pen Inject 55 Units into the skin daily as needed (When unable to use pump).   Insulin  Pen Needle (PEN NEEDLES) 32G X 4 MM MISC 1 Device by Does not apply route in the morning, at noon, in the evening, and at bedtime.   ipratropium (ATROVENT ) 0.03 % nasal spray Place 1-2 sprays into both nostrils 2 (two) times daily as needed (nasal drainage).   isosorbide  mononitrate (IMDUR ) 60 MG 24 hr tablet TAKE 1 TABLET BY MOUTH DAILY   levalbuterol  (XOPENEX  HFA) 45 MCG/ACT inhaler USE 2 INHALATIONS BY MOUTH EVERY 4 TO 6  HOURS AS NEEDED FOR  SHORTNESS OF BREATH , COUGH,  WHEEZE AND TIGHTNESS IN CHEST   levalbuterol  (XOPENEX ) 0.63 MG/3ML nebulizer solution Take 3 mLs (0.63 mg total) by nebulization every 4 (four) hours as needed for wheezing or shortness of breath (coughing fits).   meclizine  (ANTIVERT ) 25 MG tablet Take 1 tablet (25 mg total) by mouth 3 (three) times daily as needed for dizziness.   methocarbamol  (ROBAXIN ) 500 MG tablet Take 1 tablet (500 mg total) by mouth every 6 (six) hours as needed for muscle spasms.   metoprolol  tartrate (LOPRESSOR ) 50 MG tablet TAKE 1 TABLET BY MOUTH EVERY 12  HOURS   montelukast  (SINGULAIR ) 10 MG tablet Take 1 tablet (10 mg total) by mouth at bedtime.   nitroGLYCERIN  (NITROSTAT ) 0.4 MG SL tablet Place 1 tablet (0.4 mg total) under the tongue every 5 (five) minutes as needed for chest pain.   pantoprazole  (PROTONIX ) 40 MG tablet Take 1 tablet (40 mg total) by mouth 2 (  two) times daily before a meal.   Respiratory Therapy Supplies (CARETOUCH 2 CPAP HOSE HANGER) MISC by Does not apply route.   rosuvastatin  (CRESTOR ) 40 MG tablet TAKE 1 TABLET BY MOUTH AT  BEDTIME   tirzepatide  (MOUNJARO ) 10 MG/0.5ML Pen Inject 10 mg into the skin once a week.   Facility-Administered Encounter Medications as of 11/04/2023  Medication   Benralizumab  SOSY 30 mg    Allergies (verified) Contrast media [iodinated contrast media], Morphine, Ioversol, Oxycodone , and Tramadol    History: Past Medical History:  Diagnosis Date   Anemia    Arthritis    Asthma    CAD S/P two-vessel DES PCI 2008   LAD and RI; 2013 PTCA of small RCA.   CHF (congestive heart failure), NYHA class I, chronic, diastolic (HCC) 2019   Recent Echo 12/16/2021: Mild concentric LVH.  EF 59%.  GI 1 DD.  Normal LAP-(Mildly dilated LA;; has converted from to torsemide  and now on bumetanide    CKD stage 3 due to type 2 diabetes mellitus (HCC)    COPD (chronic obstructive pulmonary disease) (HCC) 2021   Complicated by asthma and  seasonal allergies.   Diabetes mellitus, type II, insulin  dependent (HCC) 2010   On insulin  60 units TID Premeal.  Also on Jardiance  and Ozempic.   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  Continue current medications.   Past Surgical History:  Procedure Laterality Date   AAA DUPLEX  10/09/2020   Max Aorta (sac) diameter 2.51 cm prox with mild ectasia in Prox Aorta. Diffuse plaque in mid-distal Aorta. Bilateral ICA poorly visulailzed - appear to be Severely stenosed with difuse plaque. - Consider CTA of cath directed Angio.   ABDOMINAL AORTOGRAM N/A 10/08/2023   Procedure: ABDOMINAL AORTOGRAM;  Surgeon: Court Dorn PARAS, MD;  Location: [facility] INVASIVE CV LAB;  Service: Cardiovascular;  Laterality: N/A;   APPENDECTOMY  1974   BIOPSY  02/22/2021   Procedure: BIOPSY;  Surgeon: Rollin Dover, MD;  Location: [facility];  Service: Endoscopy;;   CARDIAC CATHETERIZATION     CARPAL TUNNEL RELEASE  2017   CARPAL TUNNEL RELEASE Right 01/01/2023   Procedure: RIGHT CARPAL TUNNEL RELEASE REVISION;  Surgeon: Arlinda Buster, MD;  Location: [facility];  Service: Orthopedics;  Laterality: Right;   CHOLECYSTECTOMY  1997   pt states removed 1997-1998   COLONOSCOPY WITH PROPOFOL  N/A 02/22/2021   Procedure: COLONOSCOPY WITH PROPOFOL ;  Surgeon: Rollin Dover, MD;  Location: [facility];  Service: Endoscopy;  Laterality: N/A;   CORONARY BALLOON ANGIOPLASTY  2013   PTCA of RCA followed by PCI   CORONARY STENT  INTERVENTION  2008   Text DES PCI to LAD and RI/OM1; also prox RCA   ESOPHAGOGASTRODUODENOSCOPY (EGD) WITH PROPOFOL  N/A 02/22/2021   Procedure: ESOPHAGOGASTRODUODENOSCOPY (EGD) WITH PROPOFOL ;  Surgeon: Rollin Dover, MD;  Location: [facility];  Service: Endoscopy;  Laterality: N/A;   LEA Dopplers  10/09/2020   R mid &  Distal SFA -CTO - dampend flow in R Pop via collaterals - Moderate velocity increase in R PFA. No signficant stenosis in LLE.   R ABI 0.97) - normal. L ABI - 0.91 w/ monophasic flow in L AT -> c/w 11/2019- R SFA new.   LEFT HEART CATH AND CORONARY ANGIOGRAPHY  12/22/2017   Mild LM plaque.  Mild proximal LAD plaque.  High D1-40% ostial.  Patent mid LAD DES (Taxus from 2008), large distal LAD free disease; Prox LCx-OM1 stents patent (Taxus 2008). Small RCA - patent prox stent(~50-60% ISR), PTCA site from 2013 - patent   LOWER EXTREMITY ANGIOGRAM Bilateral 12/22/2017   Bilateral SFA stents with evidence of severe right and mid left ISR bilateral popliteal arteries proximal trifurcation vessels are patent.  Moderate to severe lesion involving mid R AntTib, poor distal flow in L Ant Tib - 2 V runoff Bilat. --> referred for R SFA Laser Atherectomy & DCB 5 x 120 mm.   LOWER EXTREMITY ANGIOGRAPHY N/A 10/08/2023   Procedure: Lower Extremity Angiography;  Surgeon: Court Dorn PARAS, MD;  Location: [facility] INVASIVE CV LAB;  Service: Cardiovascular;  Laterality: N/A;   LOWER EXTREMITY INTERVENTION Right 12/22/2017   Laser atherectomy of right SFA followed by DCB with 5.0 x 120 mm impact.-For severe right SFA ISR   LOWER EXTREMITY INTERVENTION  10/08/2023   Procedure: LOWER EXTREMITY INTERVENTION;  Surgeon: Court Dorn PARAS, MD;  Location: [facility] INVASIVE CV LAB;  Service: Cardiovascular;;   LUMBAR SPINE SURGERY  2010   NM MYOVIEW  LTD  07/07/2019   [facility]): [place].  Nondiagnostic EKG.  Dyspnea with effusion.  No ischemia or infarction.  Soft tissue attenuation noted.SABRA  LVEF  72%.  No RWMA.  LOW RISK.--No change from 11/2017   POLYPECTOMY  02/22/2021   Procedure: POLYPECTOMY;  Surgeon: Rollin Dover, MD;  Location: [facility];  Service: Endoscopy;;   REPLACEMENT TOTAL KNEE  2017 and 2018   TONSILLECTOMY     TOTAL ABDOMINAL HYSTERECTOMY  1989   TOTAL HIP ARTHROPLASTY  04/2018   TOTAL SHOULDER ARTHROPLASTY  11/2019   TRANSTHORACIC ECHOCARDIOGRAM  07/04/2019   Normal LV size, mild LVH, hyperdynamic LVEF at >65% with grade 1 diastolic dysfunction.  Otherwise no other significant abnormality.  Poor quality due to patient body habitus.   TRANSTHORACIC ECHOCARDIOGRAM  12/16/2021   [facility]) normal LV size and function.  Moderate concentric LVH.  Normal WM.  EF estimated 59%.  GR 1 DD.  Mild LA dilation.  No valvular lesions.   Family History  Problem Relation Age of Onset   Heart disease Mother    Breast cancer Mother    Heart attack Father    Lung cancer Father    Eczema Grandson    Allergic rhinitis Neg Hx    Angioedema Neg Hx    Asthma Neg Hx    Atopy Neg Hx    Immunodeficiency Neg Hx    Urticaria Neg Hx    Social History   Socioeconomic History   Marital status: Single    Spouse name: Not on file   Number of children: 5   Years of education: Not on file   Highest education level: Associate degree: academic program  Occupational History   Not on file  Tobacco Use   Smoking status: Former    Current packs/day: 0.00    Average packs/day: 0.5 packs/day for 15.0 years (7.5 ttl pk-yrs)    Types: Cigarettes    Start date: 10/13/1980  Quit date: 10/14/1995    Years since quitting: 28.0    Passive exposure: Current   Smokeless tobacco: Never  Vaping Use   Vaping status: Never Used  Substance and Sexual Activity   Alcohol use: Not Currently   Drug use: Not Currently   Sexual activity: Yes  Other Topics Concern   Not on file  Social History Narrative   Not on file   Social Drivers of Health   Financial Resource  Strain: Low Risk  (11/04/2023)   Overall Financial Resource Strain (CARDIA)    Difficulty of Paying Living Expenses: Not very hard  Food Insecurity: No Food Insecurity (11/04/2023)   Hunger Vital Sign    Worried About Running Out of Food in the Last Year: Never true    Ran Out of Food in the Last Year: Never true  Transportation Needs: No Transportation Needs (11/04/2023)   PRAPARE - Administrator, Civil Service (Medical): No    Lack of Transportation (Non-Medical): No  Physical Activity: Insufficiently Active (11/04/2023)   Exercise Vital Sign    Days of Exercise per Week: 5 days    Minutes of Exercise per Session: 20 min  Stress: No Stress Concern Present (11/04/2023)   [employer] of Occupational Health - Occupational Stress Questionnaire    Feeling of Stress: Only a little  Social Connections: Moderately Integrated (11/04/2023)   Social Connection and Isolation Panel    Frequency of Communication with Friends and Family: Three times a week    Frequency of Social Gatherings with Friends and Family: Once a week    Attends Religious Services: More than 4 times per year    Active Member of [employer] or Organizations: Yes    Attends Engineer, structural: More than 4 times per year    Marital Status: Never married    Tobacco Counseling Counseling given: Not Answered    Clinical Intake:  Pre-visit preparation completed: Yes  Pain : No/denies pain     BMI - recorded: 44.76 Nutritional Status: BMI > 30  Obese Nutritional Risks: None Diabetes: Yes CBG done?: No Did pt. bring in CBG monitor from home?: No  Lab Results  Component Value Date   HGBA1C 8.3 (A) 10/28/2023   HGBA1C 8.2 (A) 06/22/2023   HGBA1C 7.5 11/04/2022     How often do you need to have someone help you when you read instructions, pamphlets, or other written materials from your doctor or pharmacy?: 1 - Never  Interpreter Needed?: No  Information entered by :: Lylee Corrow  [email or web address]   Activities of Daily Living     10/28/2023    9:03 AM 10/08/2023    3:58 PM  In your present state of health, do you have any difficulty performing the following activities:  Hearing? 0 0  Vision? 0 0  Difficulty concentrating or making decisions? 0 0  Walking or climbing stairs? 1   Dressing or bathing? 0   Doing errands, shopping? 0   Preparing Food and eating ? N   Using the Toilet? N   In the past six months, have you accidently leaked urine? N   Do you have problems with loss of bowel control? N   Managing your Medications? N   Managing your Finances? N   Housekeeping or managing your Housekeeping? N     Patient Care Team: Antonio Meth, [name], DO as PCP - General (Family Medicine) Anner Alm ORN, MD as PCP - Cardiology (Cardiology) Cheryl Reusing, FNP as Nurse  Practitioner (Allergy  and Immunology) Sadiq, Sameea, MD as Referring Physician (Nephrology) Hyacinth Warren PARAS, FNP (Endocrinology)  I have updated your Care Teams any recent Medical Services you may have received from other providers in the past year.     Assessment:   This is a routine wellness examination for Karianna.  Hearing/Vision screen Hearing Screening - Comments:: No difficulties  Vision Screening - Comments:: Patient wears glasses    Goals Addressed             This Visit's Progress    Patient Stated       To get blockage taken care of in legs        Depression Screen     11/04/2023    3:10 PM 07/30/2022    1:55 PM 06/03/2022    1:21 PM 03/24/2022    1:12 PM 12/17/2021   10:32 AM 11/05/2021   10:09 AM 07/16/2021    9:09 AM  PHQ 2/9 Scores  PHQ - 2 Score 0 0 1 0 0 0 0  PHQ- 9 Score 2          Fall Risk     10/28/2023    9:03 AM 05/12/2023    2:00 PM 02/09/2023    1:10 PM 07/30/2022    1:44 PM 03/24/2022    1:12 PM  Fall Risk   Falls in the past year? 1 1 1  0 0  Number falls in past yr: 1 1 0 0 0  Injury with Fall? 0 0 0 0 0  Risk for fall due to : History of  fall(s);Impaired mobility Impaired mobility  No Fall Risks   Follow up Falls evaluation completed;Falls prevention discussed  Falls evaluation completed Falls evaluation completed Falls evaluation completed    MEDICARE RISK AT HOME:  Medicare Risk at Home Any stairs in or around the home?: (Patient-Rptd) Yes If so, are there any without handrails?: (Patient-Rptd) No Home free of loose throw rugs in walkways, pet beds, electrical cords, etc?: (Patient-Rptd) Yes Adequate lighting in your home to reduce risk of falls?: (Patient-Rptd) Yes Life alert?: (Patient-Rptd) No Use of a cane, walker or w/c?: (Patient-Rptd) Yes Grab bars in the bathroom?: (Patient-Rptd) No Shower chair or bench in shower?: (Patient-Rptd) Yes Elevated toilet seat or a handicapped toilet?: (Patient-Rptd) No  TIMED UP AND GO:  Was the test performed?  no  Cognitive Function: Declined/Normal: No cognitive concerns noted by patient or family. Patient alert, oriented, able to answer questions appropriately and recall recent events. No signs of memory loss or confusion.        11/04/2023    3:11 PM 07/30/2022    1:56 PM 07/16/2021    9:27 AM  6CIT Screen  What Year? 0 points 0 points 0 points  What month? 0 points 0 points 0 points  What time? 0 points 0 points 0 points  Count back from 20 0 points 0 points 0 points  Months in reverse 0 points 0 points 0 points  Repeat phrase 0 points 0 points 0 points  Total Score 0 points 0 points 0 points    Immunizations Immunization History  Administered Date(s) Administered   Fluad Quad(high Dose 65+) 12/17/2021   Fluad Trivalent(High Dose 65+) 02/09/2023   Influenza Inj Mdck Quad Pf 11/26/2020   Influenza Split 12/26/2010, 11/21/2011   Influenza, Quadrivalent, Recombinant, Inj, Pf 11/10/2018   Influenza,inj,Quad PF,6+ Mos 03/12/2015, 11/10/2015, 11/26/2016, 12/02/2017   Influenza-Unspecified 11/21/2011, 03/12/2015, 12/02/2017   PFIZER(Purple Top)SARS-COV-2 Vaccination  03/17/2019, 04/07/2019  PPD Test 04/28/2017   Pneumococcal Polysaccharide-23 06/14/2012   Td 03/24/2022   Tdap 09/24/2010   Zoster Recombinant(Shingrix) 08/07/2015, 11/17/2018   Zoster, Live 08/12/2015    Screening Tests Health Maintenance  Topic Date Due   Diabetic kidney evaluation - Urine ACR  Never done   OPHTHALMOLOGY EXAM  04/08/2023   Influenza Vaccine  09/25/2023   COVID-19 Vaccine (3 - 2025-26 season) 10/26/2023   Pneumococcal Vaccine: 50+ Years (2 of 2 - PCV) 05/11/2024 (Originally 06/14/2013)   HEMOGLOBIN A1C  04/26/2024   FOOT EXAM  08/02/2024   MAMMOGRAM  09/17/2024   Diabetic kidney evaluation - eGFR measurement  10/08/2024   Medicare Annual Wellness (AWV)  11/03/2024   Colonoscopy  02/23/2031   DTaP/Tdap/Td (3 - Td or Tdap) 03/24/2032   DEXA SCAN  Completed   Hepatitis C Screening  Completed   Zoster Vaccines- Shingrix  Completed   HPV VACCINES  Aged Out   Meningococcal B Vaccine  Aged Out    Health Maintenance Items Addressed:patient declined   Additional Screening:  Vision Screening: Recommended annual ophthalmology exams for early detection of glaucoma and other disorders of the eye. Is the patient up to date with their annual eye exam?  No  Who is the provider or what is the name of the office in which the patient attends annual eye exams?   Dental Screening: Recommended annual dental exams for proper oral hygiene  Community Resource Referral / Chronic Care Management: CRR required this visit?  No   CCM required this visit?  No   Plan:    I have personally reviewed and noted the following in the patient's chart:   Medical and social history Use of alcohol, tobacco or illicit drugs  Current medications and supplements including opioid prescriptions. Patient is not currently taking opioid prescriptions. Functional ability and status Nutritional status Physical activity Advanced directives List of other physicians Hospitalizations,  surgeries, and ER visits in previous 12 months Vitals Screenings to include cognitive, depression, and falls Referrals and appointments  In addition, I have reviewed and discussed with patient certain preventive protocols, quality metrics, and best practice recommendations. A written personalized care plan for preventive services as well as general preventive health recommendations were provided to patient.   Lyle MARLA Right, [place]   11/04/2023   After Visit Summary: (MyChart) Due to this being a telephonic visit, the after visit summary with patients personalized plan was offered to patient via MyChart   Notes: Nothing significant to report at this time.

## 2023-11-05 ENCOUNTER — Encounter: Payer: Self-pay | Admitting: Family Medicine

## 2023-11-05 ENCOUNTER — Telehealth: Admitting: Family Medicine

## 2023-11-05 ENCOUNTER — Ambulatory Visit

## 2023-11-05 DIAGNOSIS — K219 Gastro-esophageal reflux disease without esophagitis: Secondary | ICD-10-CM | POA: Diagnosis not present

## 2023-11-05 DIAGNOSIS — J310 Chronic rhinitis: Secondary | ICD-10-CM | POA: Diagnosis not present

## 2023-11-05 DIAGNOSIS — H6993 Unspecified Eustachian tube disorder, bilateral: Secondary | ICD-10-CM | POA: Diagnosis not present

## 2023-11-05 DIAGNOSIS — J4489 Other specified chronic obstructive pulmonary disease: Secondary | ICD-10-CM | POA: Diagnosis not present

## 2023-11-05 DIAGNOSIS — J45909 Unspecified asthma, uncomplicated: Secondary | ICD-10-CM

## 2023-11-05 DIAGNOSIS — J4551 Severe persistent asthma with (acute) exacerbation: Secondary | ICD-10-CM

## 2023-11-05 MED ORDER — PREDNISONE 10 MG PO TABS: ORAL_TABLET | ORAL | 0 refills | Status: DC

## 2023-11-05 NOTE — Progress Notes (Signed)
 RE: Katrina Vega MRN: [id number] DOB: 20-Nov-1955 Date of MyChart Video Visit: 11/05/2023  Referring provider: Antonio Cyndee Jamee JONELLE, * Primary care provider: Antonio Cyndee, Jamee JONELLE, DO  Chief Complaint: No chief complaint on file.   MyChart video Follow Up Visit via MyChart video: I connected with Katrina Vega for a follow up on 11/05/23 by MyChart video and verified that I am speaking with the correct person using two identifiers.   I discussed the limitations, risks, security and privacy concerns of performing an evaluation and management service by MyChart video and the availability of in person appointments. I also discussed with the patient that there may be a patient responsible charge related to this service. The patient expressed understanding and agreed to proceed.  Patient is at home.  Provider is at the home office.  Visit start time: 922 Visit end time: 6 Insurance consent/check in by: self check in MyChart Medical consent and medical assistant/nurse: Edwards Mckelvie  History of Present Illness: She is a 68 y.o. female, who is being followed for asthma COPD overlap syndrome, allergic rhinitis, and reflux. Her previous allergy  office visit was on 09/30/2023 with Dr. Luke.   At today's visit, she reports that about 2 days ago she began to experience symptoms including scratchy throat, cough, wheeze, headache, and postnasal drainage.  She denies fever, sweats, chills, or sick contacts.  Asthma COPD overlap syndrome is reported as moderately well-controlled with wheeze that began a couple of days ago and dry cough that began last night.  She continues montelukast  10 mg once a day, Breztri  2 puffs twice a day with a spacer, and infrequently uses lev albuterol .  She has not needed to use Pulmicort  via nebulizer over the last several months.  She continues Fasenra  injections once every 8 weeks with no large or local reactions.  She reports a significant decrease in her symptoms of asthma while  continuing on Fasenra  injections.  Allergic rhinitis is reported as poorly controlled with symptoms including occasional sneezing and copious thick postnasal drainage as well as frontal headache over the last 2 days.  She reports intermittent pain in both ears over the last several days.  She denies popping, drainage, muffled hearing, or hearing loss.  She continues Atrovent  and Nasacort .  She has previously taken lev albuterol .  She is not currently using nasal saline rinses.  She reports poor Nasacort  application technique.  She received allergen immunotherapy while living in [place]  and continued receiving allergen immunotherapy after moving to [place].  Her last allergen immunotherapy injection was on 08/19/2022 and was directed toward weed pollen, cat, dog, mold, dust mite, and cockroach.  Her last environmental allergy  skin testing on 05/27/2023 was negative including the positive control making the testing invalid.  Her last environmental allergy  testing via lab on 07/27/2023 was negative to the environmental panel.  She is interested in repeating skin testing in the near future.  Reflux is reported as poorly controlled with frequent episodes of heartburn.  She denies vomiting.  She continues pantoprazole  40 mg twice a day with only moderate relief of symptoms.  She reports that she does have a new GI provider, however, has not been to the initial visit appointment at this time.  She continues to monitor her blood sugar via Dexcom and give insulin  boluses as appropriate.  Her current medications are listed in the chart.  Assessment and Plan: Maraki is a 68 y.o. female with: Patient Instructions  Asthma Begin prednisone  10 mg tablets.  Take 2  tablets twice a day for 4 days and take 1 tablet on the fifth day, then stop Continue Breztri  2 puffs twice a day with a spacer to prevent cough or wheeze Continue montelukast  10 mg once a day to prevent cough or wheeze Continue Xopenex  2 puffs once  every 4-6 hours if needed or Xopenex  via nebulizer once every 6 hours if needed for cough or wheeze Continue Fasenra  injections once every 8 weeks for asthma control For now and for asthma flare, begin budesonide  0.5 mg by nebulizer twice a day for 1 to 2 weeks or until cough and wheeze free, then stop Call the clinic if your symptoms worsen or do not improve or if you develop a fever  Allergic rhinitis Continue montelukast  10 mg once a day Begin saline nasal rinses as needed for nasal symptoms. Use this before any medicated nasal sprays for best result Continue Nasacort  2 sprays in each nostril once a day for stuffy nose.  In the right nostril, point the applicator out toward the right ear. In the left nostril, point the applicator out toward the left ear Begin levocetirizine 5 mg once a day if needed for runny nose or itch Continue Atrovent  if needed for runny nose Begin Mucinex  600 to 1200 mg twice a day and increase fluid intake to thin mucus Consider updating your environmental allergy  skin testing.  Remember to stop antihistamines for 3 days before your skin testing appointment (levocetirizine and Nasacort )  Eustachian tube dysfunction Begin saline nasal rinses and continue Nasacort  with accurate application technique as listed above.  This will decrease the inflammation and provide some relief to your ears.  This can be a slow process  Reflux Continue dietary lifestyle modifications as listed below Continue pantoprazole  40 mg twice a day as previously prescribed.  Take this medication 30 to 60 minutes before a meal for best results Follow-up with your new GI specialist as recommended  Call the clinic if this treatment plan is not working well for you.  Follow up in the clinic in 3 months or sooner if needed.   Return in about 3 months (around 02/04/2024), or if symptoms worsen or fail to improve.  No orders of the defined types were placed in this encounter.  Lab Orders  No  laboratory test(s) ordered today    Diagnostics: Spirometry at follow-up visit  Medication List:  Current Outpatient Medications  Medication Sig Dispense Refill   aspirin  EC 81 MG tablet Take 1 tablet (81 mg total) by mouth daily. Swallow whole. 90 tablet 1   BREZTRI  AEROSPHERE 160-9-4.8 MCG/ACT AERO inhaler INHALE 2 INHALATIONS BY MOUTH  TWICE DAILY TO PREVENT COUGH OR  WHEEZE. RINSE, GARGLE, AND SPIT  AFTER USE 32.1 g 3   budesonide  (PULMICORT ) 0.5 MG/2ML nebulizer solution Take 2 mLs (0.5 mg total) by nebulization in the morning and at bedtime. During respiratory infections for 1-2 weeks at a time. (Patient taking differently: Take 0.5 mg by nebulization 2 (two) times daily as needed (Asthma). During respiratory infections for 1-2 weeks at a time.) 120 mL 2   bumetanide  (BUMEX ) 1 MG tablet Take 1 tablet (1 mg total) by mouth daily. (Patient taking differently: Take 1 mg by mouth daily as needed (Fluid).) 100 tablet 3   Cholecalciferol 50 MCG (2000 UT) TABS Take 2,000 Units by mouth daily.     clopidogrel  (PLAVIX ) 75 MG tablet TAKE 1 TABLET BY MOUTH DAILY 90 tablet 3   Continuous Blood Gluc Transmit (DEXCOM G6 TRANSMITTER) MISC  Continuous Glucose Sensor (DEXCOM G6 SENSOR) MISC 1 Device by Does not apply route continuous. 9 each 3   diazepam  (VALIUM ) 5 MG tablet Take 1 tablet (5 mg total) by mouth every 12 (twelve) hours as needed for anxiety. 5 tablet 0   diphenhydrAMINE (BENADRYL) 50 MG tablet Take 50 mg by mouth daily as needed for itching or allergies.     EPINEPHrine  (EPIPEN  2-PAK) 0.3 mg/0.3 mL IJ SOAJ injection Use as directed for severe allergic reactions (Patient taking differently: Inject 0.3 mg into the muscle as needed. Use as directed for severe allergic reactions) 2 each 3   FASENRA  PEN 30 MG/ML prefilled autoinjector INJECT 30MG  SUBCUTANEOUSLY EVERY 8 WEEKS 1 mL 6   gabapentin  (NEURONTIN ) 300 MG capsule TAKE 1 CAPSULE BY MOUTH 3 TIMES  DAILY 270 capsule 1   Glucagon   (BAQSIMI  ONE PACK) 3 MG/DOSE POWD Place 1 Device into the nose as needed (Low blood sugar with impaired consciousness). 2 each 3   glucose 4 GM chewable tablet Chew 1 tablet by mouth once as needed for low blood sugar.     HUMALOG  100 UNIT/ML injection Inject up too 200 units into pump daily. 200 mL 3   HUMALOG  KWIKPEN 100 UNIT/ML KwikPen INJECT SUBCUTANEOUSLY AS  DIRECTED BASED ON CARB AND  CORRECTION RATIO, MAX DOSE 80  UNITS DAILY 45 mL 5   Insulin  Disposable Pump (OMNIPOD 5 DEXG7G6 PODS GEN 5) MISC 1 Device by Does not apply route every other day. Change pod every 1.5 days 60 each 3   insulin  glargine (LANTUS  SOLOSTAR) 100 UNIT/ML Solostar Pen Inject 55 Units into the skin daily as needed (When unable to use pump).     Insulin  Pen Needle (PEN NEEDLES) 32G X 4 MM MISC 1 Device by Does not apply route in the morning, at noon, in the evening, and at bedtime. 100 each 2   ipratropium (ATROVENT ) 0.03 % nasal spray Place 1-2 sprays into both nostrils 2 (two) times daily as needed (nasal drainage). 90 mL 3   isosorbide  mononitrate (IMDUR ) 60 MG 24 hr tablet TAKE 1 TABLET BY MOUTH DAILY 90 tablet 3   levalbuterol  (XOPENEX  HFA) 45 MCG/ACT inhaler USE 2 INHALATIONS BY MOUTH EVERY 4 TO 6 HOURS AS NEEDED FOR  SHORTNESS OF BREATH , COUGH,  WHEEZE AND TIGHTNESS IN CHEST 45 g 6   levalbuterol  (XOPENEX ) 0.63 MG/3ML nebulizer solution Take 3 mLs (0.63 mg total) by nebulization every 4 (four) hours as needed for wheezing or shortness of breath (coughing fits). 75 mL 1   meclizine  (ANTIVERT ) 25 MG tablet Take 1 tablet (25 mg total) by mouth 3 (three) times daily as needed for dizziness. 30 tablet 0   methocarbamol  (ROBAXIN ) 500 MG tablet Take 1 tablet (500 mg total) by mouth every 6 (six) hours as needed for muscle spasms. 30 tablet 2   metoprolol  tartrate (LOPRESSOR ) 50 MG tablet TAKE 1 TABLET BY MOUTH EVERY 12  HOURS 180 tablet 3   montelukast  (SINGULAIR ) 10 MG tablet Take 1 tablet (10 mg total) by mouth at  bedtime. 90 tablet 3   nitroGLYCERIN  (NITROSTAT ) 0.4 MG SL tablet Place 1 tablet (0.4 mg total) under the tongue every 5 (five) minutes as needed for chest pain. 25 tablet 3   pantoprazole  (PROTONIX ) 40 MG tablet Take 1 tablet (40 mg total) by mouth 2 (two) times daily before a meal. 180 tablet 1   Respiratory Therapy Supplies (CARETOUCH 2 CPAP HOSE HANGER) MISC by Does not apply route.  rosuvastatin  (CRESTOR ) 40 MG tablet TAKE 1 TABLET BY MOUTH AT  BEDTIME 90 tablet 3   tirzepatide  (MOUNJARO ) 10 MG/0.5ML Pen Inject 10 mg into the skin once a week. 6 mL 0   Current Facility-Administered Medications  Medication Dose Route Frequency Provider Last Rate Last Admin   Benralizumab  SOSY 30 mg  30 mg Subcutaneous Q8 Weeks Dale, Christine, FNP   30 mg at 10/29/23 1604   Allergies: Allergies  Allergen Reactions   Contrast Media [Iodinated Contrast Media] Shortness Of Breath and Other (See Comments)    sob, chest tight 2024, small amount in joint injection tolerated well   Morphine Itching   Ioversol    Oxycodone  Itching   Tramadol  Itching and Nausea Only   I reviewed her past medical history, social history, family history, and environmental history and no significant changes have been reported from previous visit on 09/30/2023.   Objective: Physical Exam Not obtained as encounter was done via MyChart video.  Previous notes and tests were reviewed.  I discussed the assessment and treatment plan with the patient. The patient was provided an opportunity to ask questions and all were answered. The patient agreed with the plan and demonstrated an understanding of the instructions.   The patient was advised to call back or seek an in-person evaluation if the symptoms worsen or if the condition fails to improve as anticipated.  I provided 20 minutes of non-face-to-face time during this encounter.  It was my pleasure to participate in Hoberg Kasper's care today. Please feel free to contact me with  any questions or concerns.   Sincerely,  Arlean Mutter, FNP

## 2023-11-05 NOTE — Patient Instructions (Addendum)
 Asthma Begin prednisone  10 mg tablets.  Take 2 tablets twice a day for 4 days and take 1 tablet on the fifth day, then stop Continue Breztri  2 puffs twice a day with a spacer to prevent cough or wheeze Continue montelukast  10 mg once a day to prevent cough or wheeze Continue Xopenex  2 puffs once every 4-6 hours if needed or Xopenex  via nebulizer once every 6 hours if needed for cough or wheeze Continue Fasenra  injections once every 8 weeks for asthma control For now and for asthma flare, begin budesonide  0.5 mg by nebulizer twice a day for 1 to 2 weeks or until cough and wheeze free, then stop Call the clinic if your symptoms worsen or do not improve or if you develop a fever  Allergic rhinitis Continue montelukast  10 mg once a day Begin saline nasal rinses as needed for nasal symptoms. Use this before any medicated nasal sprays for best result Continue Nasacort  2 sprays in each nostril once a day for stuffy nose.  In the right nostril, point the applicator out toward the right ear. In the left nostril, point the applicator out toward the left ear Begin levocetirizine 5 mg once a day if needed for runny nose or itch Continue Atrovent  if needed for runny nose Begin Mucinex  600 to 1200 mg twice a day and increase fluid intake to thin mucus Consider updating your environmental allergy  skin testing.  Remember to stop antihistamines for 3 days before your skin testing appointment (levocetirizine and Nasacort )  Eustachian tube dysfunction Begin saline nasal rinses and continue Nasacort  with accurate application technique as listed above.  This will decrease the inflammation and provide some relief to your ears.  This can be a slow process  Reflux Continue dietary lifestyle modifications as listed below Continue pantoprazole  40 mg twice a day as previously prescribed.  Take this medication 30 to 60 minutes before a meal for best results Follow-up with your new GI specialist as recommended  Call  the clinic if this treatment plan is not working well for you.  Follow up in the clinic in 3 months or sooner if needed.

## 2023-11-09 ENCOUNTER — Ambulatory Visit
Admission: RE | Admit: 2023-11-09 | Discharge: 2023-11-09 | Disposition: A | Discharge status: Home / Self Care | Source: Ambulatory Visit | Attending: Family Medicine | Admitting: Family Medicine

## 2023-11-09 ENCOUNTER — Encounter: Payer: Self-pay | Admitting: Orthopedic Surgery

## 2023-11-09 DIAGNOSIS — Z1231 Encounter for screening mammogram for malignant neoplasm of breast: Secondary | ICD-10-CM

## 2023-11-09 NOTE — Telephone Encounter (Signed)
 Scheduled

## 2023-11-11 ENCOUNTER — Ambulatory Visit (INDEPENDENT_AMBULATORY_CARE_PROVIDER_SITE_OTHER): Admitting: Orthopedic Surgery

## 2023-11-11 ENCOUNTER — Ambulatory Visit: Admitting: Orthopedic Surgery

## 2023-11-11 DIAGNOSIS — G5602 Carpal tunnel syndrome, left upper limb: Secondary | ICD-10-CM | POA: Diagnosis not present

## 2023-11-11 DIAGNOSIS — M1812 Unilateral primary osteoarthritis of first carpometacarpal joint, left hand: Secondary | ICD-10-CM

## 2023-11-11 MED ORDER — LIDOCAINE HCL 1 % IJ SOLN
1.0000 mL | INTRAMUSCULAR | Status: AC | PRN
  Administered 2023-11-11: 1 mL

## 2023-11-11 MED ORDER — BETAMETHASONE SOD PHOS & ACET 6 (3-3) MG/ML IJ SUSP
6.0000 mg | INTRAMUSCULAR | Status: AC | PRN
  Administered 2023-11-11: 6 mg via INTRA_ARTICULAR

## 2023-11-11 NOTE — Progress Notes (Signed)
 Katrina Vega - 68 y.o. female MRN [id number]  Date of birth: [birth year]  Office Visit Note: Visit Date: 11/11/2023 PCP: Katrina Cyndee Jamee JONELLE, DO Referred by: Katrina Vega, *  Subjective:  HPI: Katrina Vega is a 68 y.o. female who returns today for evaluation of ongoing pain at the basilar aspect of the left thumb with associated swelling.  She has a recent history of right open carpal tunnel revision with subsequent irrigation debridement, she is recovering well from the right side.   She has undergone previous injection to the left thumb CMC with moderate relief.  Is stating that her left hand swelling with associated numbness and tingling is progressing.  She does have a history of prior left open carpal tunnel release done 10 to 15 years prior, similar to the right side.  Pertinent ROS were reviewed with the patient and found to be negative unless otherwise specified above in HPI.   Assessment & Plan: Visit Diagnoses:  1. Arthritis of carpometacarpal (CMC) joint of left thumb   2. Carpal tunnel syndrome, left upper limb       Plan:  Extensive discussion was had with the patient today regarding her left thumb basilar joint pain.  X-rays have previously confirmed diagnosis of ongoing left thumb CMC arthritis which correlates with clinical examination.  We reviewed the etiology and pathophysiology of this condition as well as appropriate treatment modalities ranging from conservative to surgical.  From a conservative standpoint, we discussed bracing, activity modification, nonsteroidal anti-inflammatory medications both oral and topical, and finally cortisone injections.  From surgical standpoint, we discussed the possibility for left thumb CMC arthroplasty in the future should symptoms refractory to conservative care.  I discussed the surgical treatment as well as the postop protocol in detail with patient as well today for understanding.  Injection was provided today to the  left thumb CMC joint for symptom relief.  Patient would like to move forward with surgical scheduling of left thumb CMC arthroplasty in the future as well as associated carpal tunnel release.  Risks and benefits of the procedure were discussed, risks including but not limited to infection, bleeding, scarring, stiffness, nerve injury, tendon injury, vascular injury, recurrence of symptoms and need for subsequent operation.  We also discussed the appropriate postoperative protocol and timeframe for return to activities and function.  Patient expressed understanding.    Will move forward surgical scheduling of left thumb carpometacarpal arthroplasty with associated left open carpal tunnel release for date in December.  This will give us  3 months from today's injection.  Follow-up: No follow-ups on file.   Meds & Orders: No orders of the defined types were placed in this encounter.   Orders Placed This Encounter  Procedures   Hand/UE Inj     Procedures: Hand/UE Inj: L thumb CMC for osteoarthritis on 11/11/2023 8:58 PM Indications: pain Details: 25 G needle, ultrasound-guided Medications: 1 mL lidocaine  1 %; 6 mg betamethasone  acetate-betamethasone  sodium phosphate  6 (3-3) MG/ML Outcome: tolerated well, no immediate complications  Ultrasound images were saved in the [facility] ultrasound database Procedure, treatment alternatives, risks and benefits explained, specific risks discussed.           Objective:   Vital Signs: There were no vitals taken for this visit.  Ortho Exam Right upper extremity incision is appropriately healing, no evidence of erythema, no active drainage on examination today Wrist range of motion is 60 degrees flexion, 50 degrees extension composite fist without restriction  sensation is  intact to light touch, hypersensitivity improved around surgical site  Left hand: - Tenderness associated at the thumb CMC interval, positive grind for pain and crepitus, minimal  MP hyperextension - Sensation is slightly diminished in the median nerve distribution, AIN/PIN/interosseous intact - Positive Tinel's over the carpal tunnel region, positive Phalen's - Well-healed previous incision at the volar aspect of the hand from previous carpal tunnel release  Imaging: No results found.    Katrina Vega, M.D. [facility], Hand Surgery

## 2023-11-12 ENCOUNTER — Other Ambulatory Visit: Payer: Self-pay | Admitting: "Endocrinology

## 2023-11-12 ENCOUNTER — Encounter: Payer: Self-pay | Admitting: Family Medicine

## 2023-11-12 NOTE — Telephone Encounter (Signed)
 Requested Prescriptions   Pending Prescriptions Disp Refills   LANTUS  SOLOSTAR 100 UNIT/ML Solostar Pen [Pharmacy Med Name: Lantus  SoloStar 100 UNIT/ML Subcutaneous Solution Pen-injector] 45 mL 3    Sig: INJECT SUBCUTANEOUSLY 55 UNITS  DAILY

## 2023-11-20 ENCOUNTER — Telehealth (HOSPITAL_BASED_OUTPATIENT_CLINIC_OR_DEPARTMENT_OTHER): Payer: Self-pay | Admitting: *Deleted

## 2023-11-20 NOTE — Telephone Encounter (Signed)
   Pre-operative Risk Assessment    Patient Name: NALY SCHWANZ  DOB: 27-Mar-1955 MRN: [id number]   Date of last office visit: 11/03/23 DR. BERRY Date of next office visit: 02/02/24 DR. BERRY   Request for Surgical Clearance    Procedure:  LEFT THUMB CMC ARTHROPLASTY WITH INTERNAL BRACE; LEFT CARPAL TUNNEL RELEASE   Date of Surgery:  Clearance TBD                                Surgeon:  DR. GILDARDO ALDERTON Surgeon's Group or Practice Name:  [facility] [place] Phone number:  [phone] Fax number:  [phone] ATTN: APRIL   Type of Clearance Requested:   - Medical  - Pharmacy:  Hold Aspirin  PER FORM ALSO TO HOLD MOUNJARO    Type of Anesthesia:  REGIONAL IV   Additional requests/questions:    Bonney Niels Jest   11/20/2023, 5:52 PM

## 2023-11-23 ENCOUNTER — Telehealth: Payer: Self-pay

## 2023-11-23 NOTE — Telephone Encounter (Signed)
   Name:  Katrina Vega  DOB:  12/03/1955  MRN:  [id number]   Primary Cardiologist: Alm Clay, MD  Chart reviewed as part of pre-operative protocol coverage. 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 Court performed a drug-coated balloon angioplasty of the left SFA in-stent restenosis with good result.   Reviewed request with Dr. Court given recent intervention, per Dr. Court patient will need to be on antiplatelet medications for at least three months uninterrupted after which she can hold aspirin  for 5 days prior to her procedure. Patients procedure will need to be scheduled for after 01/09/2024. Please notify requesting office, once timeframe for procedure has been determined can proceed with tele visit.   Turner Baillie D Neyra Pettie, NP 11/23/2023, 11:41 AM

## 2023-11-23 NOTE — Telephone Encounter (Signed)
 Preop clearance request routed to surgeon's office

## 2023-11-23 NOTE — Telephone Encounter (Signed)
 Appt scheduled 11/26/23.

## 2023-11-23 NOTE — Telephone Encounter (Signed)
 Initial Comment Caller states the patient is having some severe back pain and is needing to talk to a nurse. Translation No Disp. Time Titus Time) Disposition Final User 11/20/2023 1:35:56 PM Attempt made - message left Gladis OBIE Hollering 11/20/2023 1:52:26 PM FINAL ATTEMPT MADE - message left Yes Gladis RN, Melanie Final Disposition 11/20/2023 1:52:26 PM FINAL ATTEMPT MADE - message left Yes Gladis, RN, Hollering

## 2023-11-24 ENCOUNTER — Encounter: Payer: Self-pay | Admitting: Orthopaedic Surgery

## 2023-11-24 ENCOUNTER — Other Ambulatory Visit: Payer: Self-pay

## 2023-11-24 DIAGNOSIS — G8929 Other chronic pain: Secondary | ICD-10-CM

## 2023-11-24 NOTE — Telephone Encounter (Signed)
 What kind of script does she need?  Ok to refer to [facility] for another inj

## 2023-11-26 ENCOUNTER — Encounter: Payer: Self-pay | Admitting: Family Medicine

## 2023-11-26 ENCOUNTER — Ambulatory Visit: Admitting: Family Medicine

## 2023-11-26 ENCOUNTER — Telehealth (HOSPITAL_BASED_OUTPATIENT_CLINIC_OR_DEPARTMENT_OTHER): Payer: Self-pay

## 2023-11-26 VITALS — BP 104/70 | HR 80 | Temp 98.3°F | Resp 18 | Ht 65.0 in | Wt 272.6 lb

## 2023-11-26 DIAGNOSIS — R1013 Epigastric pain: Secondary | ICD-10-CM | POA: Diagnosis not present

## 2023-11-26 DIAGNOSIS — K222 Esophageal obstruction: Secondary | ICD-10-CM

## 2023-11-26 DIAGNOSIS — R300 Dysuria: Secondary | ICD-10-CM | POA: Diagnosis not present

## 2023-11-26 DIAGNOSIS — Z23 Encounter for immunization: Secondary | ICD-10-CM | POA: Diagnosis not present

## 2023-11-26 DIAGNOSIS — K219 Gastro-esophageal reflux disease without esophagitis: Secondary | ICD-10-CM

## 2023-11-26 DIAGNOSIS — K449 Diaphragmatic hernia without obstruction or gangrene: Secondary | ICD-10-CM | POA: Diagnosis not present

## 2023-11-26 LAB — POC URINALSYSI DIPSTICK (AUTOMATED)
Blood, UA: NEGATIVE
Glucose, UA: POSITIVE — AB
Ketones, UA: NEGATIVE
Leukocytes, UA: NEGATIVE
Nitrite, UA: NEGATIVE
Protein, UA: POSITIVE — AB
Spec Grav, UA: 1.015 (ref 1.010–1.025)
Urobilinogen, UA: 0.2 U/dL
pH, UA: 6 (ref 5.0–8.0)

## 2023-11-26 MED ORDER — DEXLANSOPRAZOLE 60 MG PO CPDR
60.0000 mg | DELAYED_RELEASE_CAPSULE | Freq: Every day | ORAL | 2 refills | Status: DC
Start: 2023-11-26 — End: 2024-01-04

## 2023-11-26 MED ORDER — FAMOTIDINE 20 MG PO TABS: 20.0000 mg | ORAL_TABLET | Freq: Two times a day (BID) | ORAL | 3 refills | Status: AC

## 2023-11-26 NOTE — Progress Notes (Signed)
 +++  Subjective:    Patient ID: Katrina Vega, female    DOB: [birth year], 68 y.o.   MRN: [id number]  Chief Complaint  Patient presents with   Back Pain    Lower right groin to the lower back. Pt states having burning and pain with touch. Sx started around 17th of Sept    HPI Patient is in today for Ruq pain.  Discussed the use of AI scribe software for clinical note transcription with the patient, who gave verbal consent to proceed.  History of Present Illness Katrina Vega is a 68 year old female with a history of hiatal hernia who presents with upper abdominal pain.  She experiences upper abdominal pain, specifically from the right upper quadrant to the mid-upper gastric area. The pain is sore to touch and exacerbated by certain foods like ketchup and tomato sauce. No burning sensation is currently present, and there is no associated rash.  She has a known history of a hiatal hernia and has been on pantoprazole  twice daily for years, but she feels it is no longer effective. She underwent esophageal dilation due to a stricture, which she was unaware of until reading the report. She experiences difficulty with food getting stuck and a sensation of fullness quickly, which was notable during her recent cruise.  The right side is 75% blocked, and she is awaiting further evaluation in three months.  She experiences significant back pain, particularly during her recent cruise, which she attributes to walking. She has not yet received diabetic shoes, which she believes would help alleviate some of the discomfort. She is awaiting surgery to correct her toes, which are causing her pain and difficulty walking.  She reports a history of dehydration when taking her diuretic, which she now takes at half the dose to mitigate this effect. She drinks three to five bottles of water  daily. She has a history of being on furosemide , which affected her potassium levels, but she is not currently on potassium  supplements.  She mentions experiencing shortness of breath, which was more pronounced during her cruise but has since improved. She carries an inhaler for episodes of breathlessness.    Past Medical History:  Diagnosis Date   Anemia    Arthritis    Asthma    CAD S/P two-vessel DES PCI 2008   LAD and RI; 2013 PTCA of small RCA.   CHF (congestive heart failure), NYHA class I, chronic, diastolic (HCC) 2019   Recent Echo 12/16/2021: Mild concentric LVH.  EF 59%.  GI 1 DD.  Normal LAP-(Mildly dilated LA;; has converted from to torsemide  and now on bumetanide    CKD stage 3 due to type 2 diabetes mellitus (HCC)    COPD (chronic obstructive pulmonary disease) (HCC) 2021   Complicated by asthma and seasonal allergies.   Diabetes mellitus, type II, insulin  dependent (HCC) 2010   On insulin  60 units TID Premeal.  Also on Jardiance  and Ozempic.   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  Continue current medications.    Past Surgical History:  Procedure Laterality Date   AAA DUPLEX  10/09/2020   Max Aorta (sac) diameter 2.51 cm prox with mild ectasia in Prox Aorta. Diffuse plaque in mid-distal Aorta. Bilateral ICA poorly visulailzed - appear to be Severely stenosed with difuse plaque. - Consider CTA of cath directed Angio.   ABDOMINAL AORTOGRAM N/A 10/08/2023   Procedure: ABDOMINAL AORTOGRAM;  Surgeon: Court Dorn PARAS, MD;  Location: [facility]VAS[facility] CV LAB;  Service: Cardiovascular;  Laterality: N/A;   APPENDECTOMY   1974   BIOPSY  02/22/2021   Procedure: BIOPSY;  Surgeon: Rollin Dover, MD;  Location: [facility];  Service: Endoscopy;;   CARDIAC CATHETERIZATION     CARPAL TUNNEL RELEASE  2017   CARPAL TUNNEL RELEASE Right 01/01/2023   Procedure: RIGHT CARPAL TUNNEL RELEASE REVISION;  Surgeon: Arlinda Buster, MD;  Location: [facility];  Service: Orthopedics;  Laterality: Right;   CHOLECYSTECTOMY  1997   pt states removed 1997-1998   COLONOSCOPY WITH PROPOFOL  N/A 02/22/2021   Procedure: COLONOSCOPY WITH PROPOFOL ;  Surgeon: Rollin Dover, MD;  Location: [facility];  Service: Endoscopy;  Laterality: N/A;   CORONARY BALLOON ANGIOPLASTY  2013   PTCA of RCA followed by PCI   CORONARY STENT INTERVENTION  2008   Text DES PCI to LAD and RI/OM1; also prox RCA   ESOPHAGOGASTRODUODENOSCOPY (EGD) WITH PROPOFOL  N/A 02/22/2021   Procedure: ESOPHAGOGASTRODUODENOSCOPY (EGD) WITH PROPOFOL ;  Surgeon: Rollin Dover, MD;  Location: [facility];  Service: Endoscopy;  Laterality: N/A;   LEA Dopplers  10/09/2020   R mid & Distal SFA -CTO - dampend flow in R Pop via collaterals - Moderate velocity increase in R PFA. No signficant stenosis in LLE.   R ABI 0.97) - normal. L ABI - 0.91 w/ monophasic flow in L AT -> c/w 11/2019- R SFA new.   LEFT HEART CATH AND CORONARY ANGIOGRAPHY  12/22/2017   Mild LM plaque.  Mild proximal LAD plaque.  High D1-40% ostial.  Patent mid LAD DES (Taxus from 2008), large distal LAD free disease; Prox LCx-OM1 stents patent (Taxus 2008). Small RCA - patent prox stent(~50-60% ISR), PTCA site from 2013 - patent   LOWER EXTREMITY ANGIOGRAM Bilateral 12/22/2017   Bilateral SFA stents with evidence of severe right and mid left ISR bilateral popliteal arteries proximal trifurcation vessels are patent.  Moderate to severe lesion involving mid R AntTib, poor distal flow in L Ant Tib - 2 V runoff Bilat. --> referred for R SFA Laser Atherectomy & DCB 5 x 120 mm.   LOWER EXTREMITY ANGIOGRAPHY  N/A 10/08/2023   Procedure: Lower Extremity Angiography;  Surgeon: Court Dorn PARAS, MD;  Location: [facility] INVASIVE CV LAB;  Service: Cardiovascular;  Laterality: N/A;   LOWER EXTREMITY INTERVENTION Right 12/22/2017   Laser atherectomy of right SFA followed by DCB with 5.0 x 120 mm impact.-For severe right SFA ISR   LOWER EXTREMITY INTERVENTION  10/08/2023   Procedure: LOWER EXTREMITY INTERVENTION;  Surgeon: Court Dorn PARAS, MD;  Location: [facility] INVASIVE CV LAB;  Service: Cardiovascular;;   LUMBAR SPINE SURGERY  2010   NM MYOVIEW  LTD  07/07/2019   [facility]): [place].  Nondiagnostic EKG.  Dyspnea with effusion.  No ischemia or infarction.  Soft tissue attenuation noted.SABRA  LVEF 72%.  No RWMA.  LOW RISK.--No change from 11/2017   POLYPECTOMY  02/22/2021   Procedure: POLYPECTOMY;  Surgeon: Rollin Dover, MD;  Location: [facility];  Service: Endoscopy;;   REPLACEMENT TOTAL KNEE  2017 and 2018   TONSILLECTOMY     TOTAL ABDOMINAL HYSTERECTOMY  1989   TOTAL HIP ARTHROPLASTY  04/2018   TOTAL SHOULDER ARTHROPLASTY  11/2019   TRANSTHORACIC ECHOCARDIOGRAM  07/04/2019   Normal LV size, mild LVH, hyperdynamic LVEF at >65% with grade 1 diastolic dysfunction.  Otherwise no other significant abnormality.  Poor quality due to patient body habitus.   TRANSTHORACIC ECHOCARDIOGRAM  12/16/2021   [facility]) normal LV size and function.  Moderate concentric LVH.  Normal WM.  EF estimated 59%.  GR 1 DD.  Mild LA dilation.  No valvular lesions.    Family History  Problem Relation Age of Onset   Heart disease Mother    Breast cancer Mother    Heart attack Father    Lung cancer Father    Eczema Grandson    Breast cancer Maternal Cousin 63 - 86   Allergic rhinitis Neg Hx    Angioedema Neg Hx    Asthma Neg Hx    Atopy Neg Hx    Immunodeficiency Neg Hx    Urticaria Neg Hx     Social History   Socioeconomic History   Marital status: Single    Spouse name:  Not on file   Number of children: 5   Years of education: Not on file   Highest education level: Associate degree: academic program  Occupational History   Not on file  Tobacco Use   Smoking status: Former    Current packs/day: 0.00    Average packs/day: 0.5 packs/day for 15.0 years (7.5 ttl pk-yrs)    Types: Cigarettes    Start date: 10/13/1980    Quit date: 10/14/1995    Years since quitting: 28.1    Passive exposure: Current   Smokeless tobacco: Never  Vaping Use   Vaping status: Never Used  Substance and Sexual Activity   Alcohol use: Not Currently   Drug use: Not Currently   Sexual activity: Yes  Other Topics Concern   Not on file  Social History Narrative   Not on file   Social Drivers of Health   Financial Resource Strain: Low Risk  (11/04/2023)   Overall Financial Resource Strain (CARDIA)    Difficulty of Paying Living Expenses: Not very hard  Food Insecurity: No Food Insecurity (11/04/2023)   Hunger Vital Sign    Worried About Running Out of Food in the Last Year: Never true    Ran Out of Food in the Last Year: Never true  Transportation Needs: No Transportation Needs (11/04/2023)   PRAPARE - Administrator, Civil Service (Medical): No    Lack of Transportation (Non-Medical): No  Physical Activity: Insufficiently Active (11/04/2023)   Exercise Vital Sign    Days of Exercise per Week: 5 days    Minutes of Exercise per Session: 20 min  Stress: No Stress Concern Present (11/04/2023)   [employer] of Occupational Health - Occupational Stress Questionnaire    Feeling of Stress: Only a little  Social Connections: Moderately Integrated (11/04/2023)   Social Connection and Isolation Panel    Frequency of Communication with Friends and Family: Three times a week    Frequency of Social Gatherings with Friends and Family: Once a week    Attends Religious Services: More than 4 times per year    Active Member of [employer] or Organizations: Yes    Attends Tax inspector Meetings: More than 4 times per year  Marital Status: Never married  Intimate Partner Violence: Not At Risk (11/04/2023)   Humiliation, Afraid, Rape, and Kick questionnaire    Fear of Current or Ex-Partner: No    Emotionally Abused: No    Physically Abused: No    Sexually Abused: No    Outpatient Medications Prior to Visit  Medication Sig Dispense Refill   aspirin  EC 81 MG tablet Take 1 tablet (81 mg total) by mouth daily. Swallow whole. 90 tablet 1   BREZTRI  AEROSPHERE 160-9-4.8 MCG/ACT AERO inhaler INHALE 2 INHALATIONS BY MOUTH  TWICE DAILY TO PREVENT COUGH OR  WHEEZE. RINSE, GARGLE, AND SPIT  AFTER USE 32.1 g 3   Cholecalciferol 50 MCG (2000 UT) TABS Take 2,000 Units by mouth daily.     clopidogrel  (PLAVIX ) 75 MG tablet TAKE 1 TABLET BY MOUTH DAILY 90 tablet 3   Continuous Blood Gluc Transmit (DEXCOM G6 TRANSMITTER) MISC      Continuous Glucose Sensor (DEXCOM G6 SENSOR) MISC 1 Device by Does not apply route continuous. 9 each 3   diazepam  (VALIUM ) 5 MG tablet Take 1 tablet (5 mg total) by mouth every 12 (twelve) hours as needed for anxiety. 5 tablet 0   diphenhydrAMINE (BENADRYL) 50 MG tablet Take 50 mg by mouth daily as needed for itching or allergies.     EPINEPHrine  (EPIPEN  2-PAK) 0.3 mg/0.3 mL IJ SOAJ injection Use as directed for severe allergic reactions 2 each 3   FASENRA  PEN 30 MG/ML prefilled autoinjector INJECT 30MG  SUBCUTANEOUSLY EVERY 8 WEEKS 1 mL 6   gabapentin  (NEURONTIN ) 300 MG capsule TAKE 1 CAPSULE BY MOUTH 3 TIMES  DAILY 270 capsule 1   Glucagon  (BAQSIMI  ONE PACK) 3 MG/DOSE POWD Place 1 Device into the nose as needed (Low blood sugar with impaired consciousness). 2 each 3   glucose 4 GM chewable tablet Chew 1 tablet by mouth once as needed for low blood sugar.     HUMALOG  100 UNIT/ML injection Inject up too 200 units into pump daily. 200 mL 3   HUMALOG  KWIKPEN 100 UNIT/ML KwikPen INJECT SUBCUTANEOUSLY AS  DIRECTED BASED ON CARB AND  CORRECTION RATIO, MAX  DOSE 80  UNITS DAILY 45 mL 5   Insulin  Disposable Pump (OMNIPOD 5 DEXG7G6 PODS GEN 5) MISC 1 Device by Does not apply route every other day. Change pod every 1.5 days 60 each 3   Insulin  Pen Needle (PEN NEEDLES) 32G X 4 MM MISC 1 Device by Does not apply route in the morning, at noon, in the evening, and at bedtime. 100 each 2   ipratropium (ATROVENT ) 0.03 % nasal spray Place 1-2 sprays into both nostrils 2 (two) times daily as needed (nasal drainage). (Patient taking differently: Place 1-2 sprays into both nostrils 2 (two) times daily as needed (nasal drainage).) 90 mL 3   isosorbide  mononitrate (IMDUR ) 60 MG 24 hr tablet TAKE 1 TABLET BY MOUTH DAILY 90 tablet 3   LANTUS  SOLOSTAR 100 UNIT/ML Solostar Pen INJECT SUBCUTANEOUSLY 55 UNITS  DAILY 45 mL 3   levalbuterol  (XOPENEX  HFA) 45 MCG/ACT inhaler USE 2 INHALATIONS BY MOUTH EVERY 4 TO 6 HOURS AS NEEDED FOR  SHORTNESS OF BREATH , COUGH,  WHEEZE AND TIGHTNESS IN CHEST 45 g 6   levalbuterol  (XOPENEX ) 0.63 MG/3ML nebulizer solution Take 3 mLs (0.63 mg total) by nebulization every 4 (four) hours as needed for wheezing or shortness of breath (coughing fits). 75 mL 1   meclizine  (ANTIVERT ) 25 MG tablet Take 1 tablet (25 mg total) by mouth  3 (three) times daily as needed for dizziness. 30 tablet 0   methocarbamol  (ROBAXIN ) 500 MG tablet Take 1 tablet (500 mg total) by mouth every 6 (six) hours as needed for muscle spasms. 30 tablet 2   metoprolol  tartrate (LOPRESSOR ) 50 MG tablet TAKE 1 TABLET BY MOUTH EVERY 12  HOURS 180 tablet 3   montelukast  (SINGULAIR ) 10 MG tablet Take 1 tablet (10 mg total) by mouth at bedtime. 90 tablet 3   nitroGLYCERIN  (NITROSTAT ) 0.4 MG SL tablet Place 1 tablet (0.4 mg total) under the tongue every 5 (five) minutes as needed for chest pain. 25 tablet 3   predniSONE  (DELTASONE ) 10 MG tablet Prednisone  10 mg tablets. Take 2 tablets once a day for 4 days, then take 1 tablet on the 5th day, then stop. 9 tablet 0   Respiratory Therapy  Supplies (CARETOUCH 2 CPAP HOSE HANGER) MISC by Does not apply route.     rosuvastatin  (CRESTOR ) 40 MG tablet TAKE 1 TABLET BY MOUTH AT  BEDTIME 90 tablet 3   tirzepatide  (MOUNJARO ) 10 MG/0.5ML Pen Inject 10 mg into the skin once a week. 6 mL 0   pantoprazole  (PROTONIX ) 40 MG tablet Take 1 tablet (40 mg total) by mouth 2 (two) times daily before a meal. 180 tablet 1   budesonide  (PULMICORT ) 0.5 MG/2ML nebulizer solution Take 2 mLs (0.5 mg total) by nebulization in the morning and at bedtime. During respiratory infections for 1-2 weeks at a time. (Patient not taking: Reported on 11/26/2023) 120 mL 2   bumetanide  (BUMEX ) 1 MG tablet Take 1 tablet (1 mg total) by mouth daily. (Patient not taking: Reported on 11/26/2023) 100 tablet 3   Facility-Administered Medications Prior to Visit  Medication Dose Route Frequency Provider Last Rate Last Admin   Benralizumab  SOSY 30 mg  30 mg Subcutaneous Q8 Weeks Dale, Christine, FNP   30 mg at 10/29/23 1604    Allergies  Allergen Reactions   Contrast Media [Iodinated Contrast Media] Shortness Of Breath and Other (See Comments)    sob, chest tight 2024, small amount in joint injection tolerated well   Morphine Itching   Ioversol    Oxycodone  Itching   Tramadol  Itching and Nausea Only    Review of Systems  Constitutional:  Negative for fever and malaise/fatigue.  HENT:  Negative for congestion.   Eyes:  Negative for blurred vision.  Respiratory:  Negative for cough and shortness of breath.   Cardiovascular:  Negative for chest pain, palpitations and leg swelling.  Gastrointestinal:  Positive for abdominal pain. Negative for vomiting.  Genitourinary:  Positive for dysuria and flank pain.  Musculoskeletal:  Negative for back pain.  Skin:  Negative for rash.  Neurological:  Negative for loss of consciousness and headaches.       Objective:    Physical Exam Vitals and nursing note reviewed.  Constitutional:      General: She is not in acute  distress.    Appearance: Normal appearance. She is well-developed.  HENT:     Head: Normocephalic and atraumatic.  Eyes:     General: No scleral icterus.       Right eye: No discharge.        Left eye: No discharge.  Cardiovascular:     Rate and Rhythm: Normal rate and regular rhythm.     Heart sounds: No murmur heard. Pulmonary:     Effort: Pulmonary effort is normal. No respiratory distress.     Breath sounds: Normal breath sounds.  Musculoskeletal:  General: Normal range of motion.     Cervical back: Normal range of motion and neck supple.     Right lower leg: No edema.     Left lower leg: No edema.  Skin:    General: Skin is warm and dry.  Neurological:     Mental Status: She is alert and oriented to person, place, and time.  Psychiatric:        Mood and Affect: Mood normal.        Behavior: Behavior normal.        Thought Content: Thought content normal.        Judgment: Judgment normal.     BP 104/70 (BP Location: Left Arm, Patient Position: Sitting, Cuff Size: Large)   Pulse 80   Temp 98.3 F (36.8 C) (Oral)   Resp 18   Ht 5' 5 (1.651 m)   Wt 272 lb 9.6 oz (123.7 kg)   SpO2 95%   BMI 45.36 kg/m  Wt Readings from Last 3 Encounters:  11/26/23 272 lb 9.6 oz (123.7 kg)  11/04/23 269 lb (122 kg)  11/03/23 269 lb (122 kg)    Diabetic Foot Exam - Simple   No data filed    Lab Results  Component Value Date   WBC 5.9 12/03/2023   HGB 11.4 (L) 12/03/2023   HCT 36.6 12/03/2023   PLT 274 12/03/2023   GLUCOSE 161 (H) 11/26/2023   CHOL 84 10/09/2023   TRIG 88 10/09/2023   HDL 28 (L) 10/09/2023   LDLCALC 38 10/09/2023   ALT 16 11/26/2023   AST 15 11/26/2023   NA 135 11/26/2023   K 4.5 11/26/2023   CL 100 11/26/2023   CREATININE 1.14 11/26/2023   BUN 15 11/26/2023   CO2 27 11/26/2023   TSH 1.23 08/17/2023   INR 1.0 12/19/2021   HGBA1C 8.3 (A) 10/28/2023    Lab Results  Component Value Date   TSH 1.23 08/17/2023   Lab Results  Component  Value Date   WBC 5.9 12/03/2023   HGB 11.4 (L) 12/03/2023   HCT 36.6 12/03/2023   MCV 82.2 12/03/2023   PLT 274 12/03/2023   Lab Results  Component Value Date   NA 135 11/26/2023   K 4.5 11/26/2023   CO2 27 11/26/2023   GLUCOSE 161 (H) 11/26/2023   BUN 15 11/26/2023   CREATININE 1.14 11/26/2023   BILITOT 0.3 11/26/2023   ALKPHOS 70 11/26/2023   AST 15 11/26/2023   ALT 16 11/26/2023   PROT 7.4 11/26/2023   ALBUMIN  4.4 11/26/2023   CALCIUM  10.1 11/26/2023   ANIONGAP 5 10/09/2023   EGFR 45 (L) 09/30/2023   GFR 48.99 (L) 11/26/2023   Lab Results  Component Value Date   CHOL 84 10/09/2023   Lab Results  Component Value Date   HDL 28 (L) 10/09/2023   Lab Results  Component Value Date   LDLCALC 38 10/09/2023   Lab Results  Component Value Date   TRIG 88 10/09/2023   Lab Results  Component Value Date   CHOLHDL 3.0 10/09/2023   Lab Results  Component Value Date   HGBA1C 8.3 (A) 10/28/2023       Assessment & Plan:  Dysuria -     POCT Urinalysis Dipstick (Automated) -     Urine Culture  Hiatal hernia with GERD -     Dexlansoprazole; Take 1 capsule (60 mg total) by mouth daily.  Dispense: 30 capsule; Refill: 2 -     Ambulatory  referral to Gastroenterology -     US  Abdomen Complete; Future -     Famotidine ; Take 1 tablet (20 mg total) by mouth 2 (two) times daily.  Dispense: 60 tablet; Refill: 3  Esophageal stricture -     Ambulatory referral to Gastroenterology -     US  Abdomen Complete; Future  Need for influenza vaccination -     Flu vaccine HIGH DOSE PF(Fluzone Trivalent)  Epigastric pain -     CBC with Differential/Platelet -     Comprehensive metabolic panel with GFR -     Amylase -     Lipase  Assessment and Plan Assessment & Plan Right upper quadrant abdominal pain and hiatal hernia with esophageal stricture   She experiences intermittent right upper quadrant and mid-epigastric pain, likely due to a hiatal hernia with possible esophageal  stricture. Pain worsens with certain foods like tomato sauce and tea. Pantoprazole  may no longer be effective. Differential includes esophageal stricture and hiatal hernia exacerbation. Order an abdominal ultrasound. Prescribe Dexilant instead of pantoprazole . Recommend over-the-counter Pepcid . Refer to a GI specialist for further evaluation and request medical records from her previous GI provider.  Type 2 diabetes mellitus   She has type 2 diabetes mellitus with recent glucosuria. Currently on Mounjaro  and Humalog  100. Jardiance  was discontinued without replacement.  Dehydration associated with diuretic use   She experiences dehydration associated with Bumetanide  use, even with a reduced dosage. Consumes 3-5 bottles of water  daily.  Foot pain and toe deformity   Foot pain and toe deformity cause discomfort, especially when walking. She is awaiting surgical intervention after recent cardiac blockage treatment. Follow up with a podiatrist for potential surgical intervention.  Shortness of breath   She has intermittent shortness of breath, previously exacerbated on a cruise.  General Health Maintenance   She needs a flu vaccination. Administer the flu shot.    Erykah Lippert R Lowne Chase, DO

## 2023-11-27 ENCOUNTER — Ambulatory Visit (INDEPENDENT_AMBULATORY_CARE_PROVIDER_SITE_OTHER): Admitting: Podiatry

## 2023-11-27 DIAGNOSIS — M216X2 Other acquired deformities of left foot: Secondary | ICD-10-CM | POA: Diagnosis not present

## 2023-11-27 DIAGNOSIS — M216X1 Other acquired deformities of right foot: Secondary | ICD-10-CM

## 2023-11-27 LAB — LIPASE: Lipase: 27 U/L (ref 11.0–59.0)

## 2023-11-27 LAB — CBC WITH DIFFERENTIAL/PLATELET
Basophils Absolute: 0.1 K/uL (ref 0.0–0.1)
Basophils Relative: 1.3 % (ref 0.0–3.0)
Eosinophils Absolute: 0 K/uL (ref 0.0–0.7)
Eosinophils Relative: 0 % (ref 0.0–5.0)
HCT: 36.8 % (ref 36.0–46.0)
Hemoglobin: 11.8 g/dL — ABNORMAL LOW (ref 12.0–15.0)
Lymphocytes Relative: 37.8 % (ref 12.0–46.0)
Lymphs Abs: 2.5 K/uL (ref 0.7–4.0)
MCHC: 32 g/dL (ref 30.0–36.0)
MCV: 80.6 fl (ref 78.0–100.0)
Monocytes Absolute: 0.6 K/uL (ref 0.1–1.0)
Monocytes Relative: 8.3 % (ref 3.0–12.0)
Neutro Abs: 3.5 K/uL (ref 1.4–7.7)
Neutrophils Relative %: 52.6 % (ref 43.0–77.0)
Platelets: 312 K/uL (ref 150.0–400.0)
RBC: 4.56 Mil/uL (ref 3.87–5.11)
RDW: 18.3 % — ABNORMAL HIGH (ref 11.5–15.5)
WBC: 6.7 K/uL (ref 4.0–10.5)

## 2023-11-27 LAB — COMPREHENSIVE METABOLIC PANEL WITH GFR
ALT: 16 U/L (ref 0–35)
AST: 15 U/L (ref 0–37)
Albumin: 4.4 g/dL (ref 3.5–5.2)
Alkaline Phosphatase: 70 U/L (ref 39–117)
BUN: 15 mg/dL (ref 6–23)
CO2: 27 meq/L (ref 19–32)
Calcium: 10.1 mg/dL (ref 8.4–10.5)
Chloride: 100 meq/L (ref 96–112)
Creatinine, Ser: 1.14 mg/dL (ref 0.40–1.20)
GFR: 48.99 mL/min — ABNORMAL LOW (ref >60.00)
Glucose, Bld: 161 mg/dL — ABNORMAL HIGH (ref 70–99)
Potassium: 4.5 meq/L (ref 3.5–5.1)
Sodium: 135 meq/L (ref 135–145)
Total Bilirubin: 0.3 mg/dL (ref 0.2–1.2)
Total Protein: 7.4 g/dL (ref 6.0–8.3)

## 2023-11-27 LAB — URINE CULTURE
MICRO NUMBER:: 17048041
SPECIMEN QUALITY:: ADEQUATE

## 2023-11-27 LAB — AMYLASE: Amylase: 38 U/L (ref 27–131)

## 2023-11-27 NOTE — Progress Notes (Signed)
 Subjective:  Patient ID: Katrina Vega, female    DOB: [birth year],  MRN: [id number]  Chief Complaint  Patient presents with   Foot Pain    Right foot pain     68 y.o. female presents with the above complaint.  Patient presents with bilateral plantarflexed third metatarsal painful to touch.  She states it hurts with ambulation hurts with pressure she has not seen MRIs prior to seeing me.  She does have a history of diabetes as well as vascular issues.  She is scheduled to see vascular surgery for intervention as well.  She wanted to get a consultation for surgical intervention of these bilateral plantarflexed third metatarsal.  Pain scale 7 out of 10 dull aching nature.    Review of Systems: Negative except as noted in the HPI. Denies N/V/F/Ch.  Past Medical History:  Diagnosis Date   Anemia    Arthritis    Asthma    CAD S/P two-vessel DES PCI 2008   LAD and RI; 2013 PTCA of small RCA.   CHF (congestive heart failure), NYHA class I, chronic, diastolic (HCC) 2019   Recent Echo 12/16/2021: Mild concentric LVH.  EF 59%.  GI 1 DD.  Normal LAP-(Mildly dilated LA;; has converted from to torsemide  and now on bumetanide    CKD stage 3 due to type 2 diabetes mellitus (HCC)    COPD (chronic obstructive pulmonary disease) (HCC) 2021   Complicated by asthma and seasonal allergies.   Diabetes mellitus, type II, insulin  dependent (HCC) 2010   On insulin  60 units TID Premeal.  Also on Jardiance  and Ozempic.   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  Continue current medications.    Current Outpatient Medications:    aspirin  EC 81 MG tablet, Take 1 tablet (81 mg total) by mouth daily. Swallow whole., Disp: 90 tablet, Rfl: 1   BREZTRI  AEROSPHERE 160-9-4.8 MCG/ACT AERO inhaler, INHALE 2 INHALATIONS BY MOUTH  TWICE DAILY TO PREVENT COUGH OR  WHEEZE. RINSE, GARGLE, AND SPIT  AFTER USE, Disp: 32.1 g, Rfl: 3   budesonide  (PULMICORT ) 0.5 MG/2ML nebulizer solution, Take 2 mLs (0.5 mg total) by nebulization in the morning and at bedtime. During respiratory infections for 1-2 weeks at a time. (Patient not taking: Reported on 11/26/2023), Disp: 120 mL, Rfl: 2   bumetanide  (BUMEX ) 1 MG tablet, Take 1 tablet (1 mg total) by mouth daily. (Patient not taking: Reported on 11/26/2023), Disp: 100 tablet, Rfl: 3   Cholecalciferol 50 MCG (2000 UT) TABS, Take 2,000 Units by mouth daily., Disp: , Rfl:    clopidogrel  (PLAVIX ) 75 MG tablet, TAKE 1 TABLET BY MOUTH DAILY, Disp: 90 tablet, Rfl: 3   Continuous Blood Gluc Transmit (DEXCOM G6 TRANSMITTER) MISC, , Disp: , Rfl:    Continuous Glucose Sensor (DEXCOM G6 SENSOR) MISC, 1 Device by Does not apply route continuous., Disp: 9 each, Rfl: 3   dexlansoprazole (DEXILANT) 60 MG capsule, Take 1 capsule (  60 mg total) by mouth daily., Disp: 30 capsule, Rfl: 2   diazepam  (VALIUM ) 5 MG tablet, Take 1 tablet (5 mg total) by mouth every 12 (twelve) hours as needed for anxiety., Disp: 5 tablet, Rfl: 0   diphenhydrAMINE (BENADRYL) 50 MG tablet, Take 50 mg by mouth daily as needed for itching or allergies., Disp: , Rfl:    EPINEPHrine  (EPIPEN  2-PAK) 0.3 mg/0.3 mL IJ SOAJ injection, Use as directed for severe allergic reactions, Disp: 2 each, Rfl: 3   famotidine  (PEPCID ) 20 MG tablet, Take 1 tablet (20 mg total) by mouth 2 (two) times daily., Disp: 60 tablet, Rfl: 3   FASENRA  PEN 30 MG/ML prefilled autoinjector, INJECT 30MG  SUBCUTANEOUSLY EVERY 8 WEEKS, Disp: 1 mL, Rfl: 6   gabapentin  (NEURONTIN ) 300  MG capsule, TAKE 1 CAPSULE BY MOUTH 3 TIMES  DAILY, Disp: 270 capsule, Rfl: 1   Glucagon  (BAQSIMI  ONE PACK) 3 MG/DOSE POWD, Place 1 Device into the nose as needed (Low blood sugar with impaired consciousness)., Disp: 2 each, Rfl: 3   glucose 4 GM chewable tablet, Chew 1 tablet by mouth once as needed for low blood sugar., Disp: , Rfl:    HUMALOG  100 UNIT/ML injection, Inject up too 200 units into pump daily., Disp: 200 mL, Rfl: 3   HUMALOG  KWIKPEN 100 UNIT/ML KwikPen, INJECT SUBCUTANEOUSLY AS  DIRECTED BASED ON CARB AND  CORRECTION RATIO, MAX DOSE 80  UNITS DAILY, Disp: 45 mL, Rfl: 5   Insulin  Disposable Pump (OMNIPOD 5 DEXG7G6 PODS GEN 5) MISC, 1 Device by Does not apply route every other day. Change pod every 1.5 days, Disp: 60 each, Rfl: 3   Insulin  Pen Needle (PEN NEEDLES) 32G X 4 MM MISC, 1 Device by Does not apply route in the morning, at noon, in the evening, and at bedtime., Disp: 100 each, Rfl: 2   ipratropium (ATROVENT ) 0.03 % nasal spray, Place 1-2 sprays into both nostrils 2 (two) times daily as needed (nasal drainage). (Patient taking differently: Place 1-2 sprays into both nostrils 2 (two) times daily as needed (nasal drainage).), Disp: 90 mL, Rfl: 3   isosorbide  mononitrate (IMDUR ) 60 MG 24 hr tablet, TAKE 1 TABLET BY MOUTH DAILY, Disp: 90 tablet, Rfl: 3   LANTUS  SOLOSTAR 100 UNIT/ML Solostar Pen, INJECT SUBCUTANEOUSLY 55 UNITS  DAILY, Disp: 45 mL, Rfl: 3   levalbuterol  (XOPENEX  HFA) 45 MCG/ACT inhaler, USE 2 INHALATIONS BY MOUTH EVERY 4 TO 6 HOURS AS NEEDED FOR  SHORTNESS OF BREATH , COUGH,  WHEEZE AND TIGHTNESS IN CHEST, Disp: 45 g, Rfl: 6   levalbuterol  (XOPENEX ) 0.63 MG/3ML nebulizer solution, Take 3 mLs (0.63 mg total) by nebulization every 4 (four) hours as needed for wheezing or shortness of breath (coughing fits)., Disp: 75 mL, Rfl: 1   meclizine  (ANTIVERT ) 25 MG tablet, Take 1 tablet (25 mg total) by mouth 3 (three) times daily as needed for dizziness., Disp: 30 tablet, Rfl: 0    methocarbamol  (ROBAXIN ) 500 MG tablet, Take 1 tablet (500 mg total) by mouth every 6 (six) hours as needed for muscle spasms., Disp: 30 tablet, Rfl: 2   metoprolol  tartrate (LOPRESSOR ) 50 MG tablet, TAKE 1 TABLET BY MOUTH EVERY 12  HOURS, Disp: 180 tablet, Rfl: 3   montelukast  (SINGULAIR ) 10 MG tablet, Take 1 tablet (10 mg total) by mouth at bedtime., Disp: 90 tablet, Rfl: 3   nitroGLYCERIN  (NITROSTAT ) 0.4 MG SL tablet, Place 1 tablet (0.4 mg total) under the tongue every 5 (five) minutes as needed for  chest pain., Disp: 25 tablet, Rfl: 3   predniSONE  (DELTASONE ) 10 MG tablet, Prednisone  10 mg tablets. Take 2 tablets once a day for 4 days, then take 1 tablet on the 5th day, then stop., Disp: 9 tablet, Rfl: 0   Respiratory Therapy Supplies (CARETOUCH 2 CPAP HOSE HANGER) MISC, by Does not apply route., Disp: , Rfl:    rosuvastatin  (CRESTOR ) 40 MG tablet, TAKE 1 TABLET BY MOUTH AT  BEDTIME, Disp: 90 tablet, Rfl: 3   tirzepatide  (MOUNJARO ) 10 MG/0.5ML Pen, Inject 10 mg into the skin once a week., Disp: 6 mL, Rfl: 0  Current Facility-Administered Medications:    Benralizumab  SOSY 30 mg, 30 mg, Subcutaneous, Q8 Weeks, Dale, Christine, FNP, 30 mg at 10/29/23 1604  Social History   Tobacco Use  Smoking Status Former   Current packs/day: 0.00   Average packs/day: 0.5 packs/day for 15.0 years (7.5 ttl pk-yrs)   Types: Cigarettes   Start date: 10/13/1980   Quit date: 10/14/1995   Years since quitting: 28.1   Passive exposure: Current  Smokeless Tobacco Never    Allergies  Allergen Reactions   Contrast Media [Iodinated Contrast Media] Shortness Of Breath and Other (See Comments)    sob, chest tight 2024, small amount in joint injection tolerated well   Morphine Itching   Ioversol    Oxycodone  Itching   Tramadol  Itching and Nausea Only   Objective:  There were no vitals filed for this visit. There is no height or weight on file to calculate BMI. Constitutional Well developed. Well nourished.   Vascular Dorsalis pedis pulses palpable bilaterally. Posterior tibial pulses palpable bilaterally. Capillary refill normal to all digits.  No cyanosis or clubbing noted. Pedal hair growth normal.  Neurologic Normal speech. Oriented to person, place, and time. Epicritic sensation to light touch grossly present bilaterally.  Dermatologic Bilateral plantarflexed third metatarsal noted with submetatarsal 5 skin lesion.  Pain on palpation to the lesion.  No open wounds or lesion noted  Orthopedic: Normal joint ROM without pain or crepitus bilaterally. No visible deformities. No bony tenderness.   Radiographs: None Assessment:   1. Plantar flexed metatarsal bone of right foot   2. Plantar flexed metatarsal bone of left foot     Plan:  Patient was evaluated and treated and all questions answered.  Bilateral plantarflexed third metatarsal with history of peripheral vascular disease and diabetes - All questions and concerns were discussed with the patient today in extensive detail.  Given the amount of vascular disease is present she will need clearance from vascular surgery to undergo this procedure she is scheduled to see them next week for stenting procedure as well.  Will continue to monitor the patient.She will also need her A1c below 8% currently at 8.2 - In the future we will plan on doing bilateral third metatarsal floating osteotomy.

## 2023-11-30 ENCOUNTER — Encounter: Payer: Self-pay | Admitting: Family

## 2023-12-01 ENCOUNTER — Other Ambulatory Visit: Payer: Self-pay

## 2023-12-01 DIAGNOSIS — D509 Iron deficiency anemia, unspecified: Secondary | ICD-10-CM

## 2023-12-03 ENCOUNTER — Ambulatory Visit (HOSPITAL_BASED_OUTPATIENT_CLINIC_OR_DEPARTMENT_OTHER)
Admission: RE | Admit: 2023-12-03 | Discharge: 2023-12-03 | Disposition: A | Discharge status: Home / Self Care | Source: Ambulatory Visit | Attending: Family Medicine | Admitting: Family Medicine

## 2023-12-03 ENCOUNTER — Inpatient Hospital Stay: Attending: Hematology & Oncology

## 2023-12-03 DIAGNOSIS — K449 Diaphragmatic hernia without obstruction or gangrene: Secondary | ICD-10-CM | POA: Insufficient documentation

## 2023-12-03 DIAGNOSIS — K222 Esophageal obstruction: Secondary | ICD-10-CM | POA: Insufficient documentation

## 2023-12-03 DIAGNOSIS — K219 Gastro-esophageal reflux disease without esophagitis: Secondary | ICD-10-CM | POA: Insufficient documentation

## 2023-12-03 DIAGNOSIS — D509 Iron deficiency anemia, unspecified: Secondary | ICD-10-CM | POA: Insufficient documentation

## 2023-12-03 DIAGNOSIS — Z9049 Acquired absence of other specified parts of digestive tract: Secondary | ICD-10-CM | POA: Diagnosis not present

## 2023-12-03 DIAGNOSIS — K7689 Other specified diseases of liver: Secondary | ICD-10-CM | POA: Diagnosis not present

## 2023-12-03 DIAGNOSIS — R1013 Epigastric pain: Secondary | ICD-10-CM | POA: Diagnosis not present

## 2023-12-03 LAB — CBC WITH DIFFERENTIAL (CANCER CENTER ONLY)
Abs Immature Granulocytes: 0.04 K/uL (ref 0.00–0.07)
Basophils Absolute: 0 K/uL (ref 0.0–0.1)
Basophils Relative: 0 %
Eosinophils Absolute: 0.1 K/uL (ref 0.0–0.5)
Eosinophils Relative: 2 %
HCT: 36.6 % (ref 36.0–46.0)
Hemoglobin: 11.4 g/dL — ABNORMAL LOW (ref 12.0–15.0)
Immature Granulocytes: 1 %
Lymphocytes Relative: 43 %
Lymphs Abs: 2.5 K/uL (ref 0.7–4.0)
MCH: 25.6 pg — ABNORMAL LOW (ref 26.0–34.0)
MCHC: 31.1 g/dL (ref 30.0–36.0)
MCV: 82.2 fL (ref 80.0–100.0)
Monocytes Absolute: 0.5 K/uL (ref 0.1–1.0)
Monocytes Relative: 9 %
Neutro Abs: 2.7 K/uL (ref 1.7–7.7)
Neutrophils Relative %: 45 %
Platelet Count: 274 K/uL (ref 150–400)
RBC: 4.45 MIL/uL (ref 3.87–5.11)
RDW: 17.1 % — ABNORMAL HIGH (ref 11.5–15.5)
WBC Count: 5.9 K/uL (ref 4.0–10.5)
nRBC: 0 % (ref 0.0–0.2)

## 2023-12-03 LAB — IRON AND IRON BINDING CAPACITY (CC-WL,HP ONLY)
Iron: 48 ug/dL (ref 28–170)
Saturation Ratios: 11 % (ref 10.4–31.8)
TIBC: 455 ug/dL — ABNORMAL HIGH (ref 250–450)
UIBC: 407 ug/dL

## 2023-12-03 LAB — FERRITIN
Ferritin: 22 ng/mL (ref 11–307)
Ferritin: 23 ng/mL (ref 11–307)

## 2023-12-03 LAB — RETICULOCYTES
Immature Retic Fract: 28.3 % — ABNORMAL HIGH (ref 2.3–15.9)
RBC.: 4.44 MIL/uL (ref 3.87–5.11)
Retic Count, Absolute: 124.3 K/uL (ref 19.0–186.0)
Retic Ct Pct: 2.8 % (ref 0.4–3.1)

## 2023-12-04 ENCOUNTER — Ambulatory Visit: Payer: Self-pay | Admitting: Family Medicine

## 2023-12-04 DIAGNOSIS — K222 Esophageal obstruction: Secondary | ICD-10-CM | POA: Diagnosis not present

## 2023-12-04 DIAGNOSIS — R300 Dysuria: Secondary | ICD-10-CM | POA: Diagnosis not present

## 2023-12-04 DIAGNOSIS — Z23 Encounter for immunization: Secondary | ICD-10-CM

## 2023-12-07 ENCOUNTER — Encounter: Payer: Self-pay | Admitting: Cardiovascular Disease

## 2023-12-09 ENCOUNTER — Other Ambulatory Visit: Payer: Self-pay

## 2023-12-09 ENCOUNTER — Ambulatory Visit (INDEPENDENT_AMBULATORY_CARE_PROVIDER_SITE_OTHER): Admitting: Sports Medicine

## 2023-12-09 ENCOUNTER — Encounter: Payer: Self-pay | Admitting: Sports Medicine

## 2023-12-09 DIAGNOSIS — Z794 Long term (current) use of insulin: Secondary | ICD-10-CM

## 2023-12-09 DIAGNOSIS — M25512 Pain in left shoulder: Secondary | ICD-10-CM

## 2023-12-09 DIAGNOSIS — M12812 Other specific arthropathies, not elsewhere classified, left shoulder: Secondary | ICD-10-CM

## 2023-12-09 DIAGNOSIS — M1812 Unilateral primary osteoarthritis of first carpometacarpal joint, left hand: Secondary | ICD-10-CM | POA: Diagnosis not present

## 2023-12-09 DIAGNOSIS — G8929 Other chronic pain: Secondary | ICD-10-CM | POA: Diagnosis not present

## 2023-12-09 DIAGNOSIS — G5602 Carpal tunnel syndrome, left upper limb: Secondary | ICD-10-CM | POA: Diagnosis not present

## 2023-12-09 DIAGNOSIS — N1832 Chronic kidney disease, stage 3b: Secondary | ICD-10-CM | POA: Diagnosis not present

## 2023-12-09 DIAGNOSIS — E1122 Type 2 diabetes mellitus with diabetic chronic kidney disease: Secondary | ICD-10-CM

## 2023-12-09 DIAGNOSIS — M19012 Primary osteoarthritis, left shoulder: Secondary | ICD-10-CM | POA: Diagnosis not present

## 2023-12-09 MED ORDER — METHYLPREDNISOLONE ACETATE 40 MG/ML IJ SUSP
40.0000 mg | INTRAMUSCULAR | Status: AC | PRN
  Administered 2023-12-09: 40 mg via INTRA_ARTICULAR

## 2023-12-09 MED ORDER — BUPIVACAINE HCL 0.25 % IJ SOLN
2.0000 mL | INTRAMUSCULAR | Status: AC | PRN
  Administered 2023-12-09: 2 mL via INTRA_ARTICULAR

## 2023-12-09 MED ORDER — LIDOCAINE HCL 1 % IJ SOLN
2.0000 mL | INTRAMUSCULAR | Status: AC | PRN
  Administered 2023-12-09: 2 mL

## 2023-12-09 NOTE — Progress Notes (Signed)
 Patient says that she got 100% relief from the injection in July. She says that she began having pain in the shoulder again towards the end of September. She has also had spasms through the bicep and anterior shoulder which she does not remember having previously. She is here today for repeat injection.
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
## 2023-12-15 ENCOUNTER — Inpatient Hospital Stay

## 2023-12-15 VITALS — BP 112/48 | HR 81 | Temp 97.8°F | Resp 18

## 2023-12-15 DIAGNOSIS — D509 Iron deficiency anemia, unspecified: Secondary | ICD-10-CM

## 2023-12-15 MED ORDER — SODIUM CHLORIDE 0.9 % IV SOLN: INTRAVENOUS | Status: DC

## 2023-12-15 MED ORDER — SODIUM CHLORIDE 0.9 % IV SOLN
510.0000 mg | Freq: Once | INTRAVENOUS | Status: AC
  Administered 2023-12-15: 510 mg via INTRAVENOUS
  Filled 2023-12-15: qty 17

## 2023-12-15 NOTE — Patient Instructions (Signed)

## 2023-12-17 ENCOUNTER — Encounter: Payer: Self-pay | Admitting: Orthopedic Surgery

## 2023-12-19 ENCOUNTER — Other Ambulatory Visit: Payer: Self-pay | Admitting: Family Medicine

## 2023-12-19 DIAGNOSIS — K2100 Gastro-esophageal reflux disease with esophagitis, without bleeding: Secondary | ICD-10-CM

## 2023-12-21 ENCOUNTER — Encounter: Payer: Self-pay | Admitting: Orthopedic Surgery

## 2023-12-21 ENCOUNTER — Encounter: Payer: Self-pay | Admitting: "Endocrinology

## 2023-12-21 NOTE — Telephone Encounter (Signed)
 This is for you!

## 2023-12-22 ENCOUNTER — Inpatient Hospital Stay

## 2023-12-22 VITALS — BP 114/61 | HR 99 | Temp 98.6°F | Resp 16

## 2023-12-22 DIAGNOSIS — D509 Iron deficiency anemia, unspecified: Secondary | ICD-10-CM

## 2023-12-22 MED ORDER — SODIUM CHLORIDE 0.9 % IV SOLN
510.0000 mg | Freq: Once | INTRAVENOUS | Status: AC
  Administered 2023-12-22: 510 mg via INTRAVENOUS
  Filled 2023-12-22: qty 17

## 2023-12-22 MED ORDER — SODIUM CHLORIDE 0.9 % IV SOLN: INTRAVENOUS | Status: DC

## 2023-12-22 NOTE — Patient Instructions (Signed)

## 2023-12-24 ENCOUNTER — Ambulatory Visit

## 2023-12-28 ENCOUNTER — Encounter: Payer: Self-pay | Admitting: Radiology

## 2023-12-28 ENCOUNTER — Other Ambulatory Visit

## 2023-12-28 ENCOUNTER — Ambulatory Visit (INDEPENDENT_AMBULATORY_CARE_PROVIDER_SITE_OTHER)

## 2023-12-28 ENCOUNTER — Encounter: Payer: Self-pay | Admitting: "Endocrinology

## 2023-12-28 ENCOUNTER — Ambulatory Visit (INDEPENDENT_AMBULATORY_CARE_PROVIDER_SITE_OTHER): Admitting: "Endocrinology

## 2023-12-28 VITALS — BP 110/80 | HR 74 | Ht 65.0 in | Wt 270.0 lb

## 2023-12-28 DIAGNOSIS — J4551 Severe persistent asthma with (acute) exacerbation: Secondary | ICD-10-CM

## 2023-12-28 DIAGNOSIS — E7800 Pure hypercholesterolemia, unspecified: Secondary | ICD-10-CM

## 2023-12-28 DIAGNOSIS — E1165 Type 2 diabetes mellitus with hyperglycemia: Secondary | ICD-10-CM | POA: Diagnosis not present

## 2023-12-28 DIAGNOSIS — Z7985 Long-term (current) use of injectable non-insulin antidiabetic drugs: Secondary | ICD-10-CM | POA: Diagnosis not present

## 2023-12-28 DIAGNOSIS — Z9641 Presence of insulin pump (external) (internal): Secondary | ICD-10-CM

## 2023-12-28 LAB — POCT GLYCOSYLATED HEMOGLOBIN (HGB A1C): Hemoglobin A1C: 7.9 % — AB (ref 4.0–5.6)

## 2023-12-28 MED ORDER — DEXCOM G7 SENSOR MISC: 1.0000 | 3 refills | Status: AC

## 2023-12-28 MED ORDER — TIRZEPATIDE 12.5 MG/0.5ML ~~LOC~~ SOAJ: 12.5000 mg | SUBCUTANEOUS | 0 refills | Status: DC

## 2023-12-28 NOTE — Telephone Encounter (Signed)
 I faxed clearance request to Endo. Patient has been scheduled for surgery on 02/05/24.

## 2023-12-28 NOTE — Telephone Encounter (Signed)
 see all previous notes. Requesting office sent an update; asking if pt will be able to hold Plavix  x 5 days prior to surgery after 01/09/24.

## 2023-12-28 NOTE — Progress Notes (Signed)
 Outpatient Endocrinology Note Obadiah Birmingham, MD  12/28/23   Katrina Vega [birth year] [id number]  Referring Provider: Antonio Meth, [name], * Primary Care Provider: Antonio Meth, [name], DO Reason for consultation: Subjective   Assessment & Plan  Diagnoses and all orders for this visit:  Uncontrolled type 2 diabetes mellitus with hyperglycemia (HCC) -     POCT glycosylated hemoglobin (Hb A1C) -     Microalbumin / creatinine urine ratio  Insulin  pump in place  Long-term (current) use of injectable non-insulin  antidiabetic drugs  Pure hypercholesterolemia  Other orders -     Continuous Glucose Sensor (DEXCOM G7 SENSOR) MISC; 1 Device by Does not apply route continuous. -     tirzepatide  (MOUNJARO ) 12.5 MG/0.5ML Pen; Inject 12.5 mg into the skin once a week.   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, + neuropathy, + nephropathy Lab Results  Component Value Date   GFR 48.99 (L) 11/26/2023   Hba1c goal less than 7, current Hba1c is  Lab Results  Component Value Date   HGBA1C 7.9 (A) 12/28/2023   Will recommend the following: Running out of pods but reports insurance only pays for pod change in 2 days, prescription written for change every 1.5 days Omnipod 5 with DexCom G6 with Humalog  U100  Basal: 2.3 u/hr I:C 1:3 ISF 1:20 IOB 3 hrs Target 110, correct above 120  Mounjaro  12.5 mg weekly  Cannot adjust insulin  doses given the patient forgot pump device at home  Previously on, Lantus  60 units once a day Humalog  bolus: 15 min before meals for 1 unit for every 4 grams of carbohydrates  Correction scale: Use in addition to your meal time/short acting insulin  based on blood sugars as follows:  151 - 175: 1 unit 176 - 200: 2 units 201 - 225: 3 units 226 - 250: 4 units 251 - 275: 5 units 276 - 300: 6 units 301 - 325: 7 units 326 - 350: 8 units 351 - 375: 9 units 376 - 400: 10 units   Stopped Ozempic 2mg /week (lost 20 lbs initially, then started gaining  back) Jardiance  10 mg every day - stopped taking it due to yeast infections   No history of MEN syndrome/medullary thyroid  cancer/pancreatitis or pancreatic cancer in self or family No known contraindications/side effects to any of above medications Glucagon  discussed and prescribed with refills on 06/22/23   -Last LD and Tg are as follows: Lab Results  Component Value Date   LDLCALC 38 10/09/2023    Lab Results  Component Value Date   TRIG 88 10/09/2023   -On rosuvastatin  40 mg QD -Follow low fat diet and exercise   -Blood pressure goal <140/90 - Microalbumin/creatinine goal is < 30 -Last MA/Cr is as follows: No results found for: MICROALBUR, MALB24HUR  -on ACE/ARB losartan  25 mg every day (prev. on lisinopril  20 mg every day?) -diet changes including salt restriction -limit eating outside -counseled BP targets per standards of diabetes care -uncontrolled blood pressure can lead to retinopathy, nephropathy and cardiovascular and atherosclerotic heart disease  Reviewed and counseled on: -A1C target -Blood sugar targets -Complications of uncontrolled diabetes  -Checking blood sugar before meals and bedtime and bring log next visit -All medications with mechanism of action and side effects -Hypoglycemia management: rule of 15's, Glucagon  Emergency Kit and medical alert ID -low-carb low-fat plate-method diet -At least 20 minutes of physical activity per day -Annual dilated retinal eye exam and foot exam -compliance and follow up needs -follow  up as scheduled or earlier if problem gets worse  Call if blood sugar is less than 70 or consistently above 250    Take a 15 gm snack of carbohydrate at bedtime before you go to sleep if your blood sugar is less than 100.    If you are going to fast after midnight for a test or procedure, ask your physician for instructions on how to reduce/decrease your insulin  dose.    Call if blood sugar is less than 70 or consistently above  250  -Treating a low sugar by rule of 15  (15 gms of sugar every 15 min until sugar is more than 70) If you feel your sugar is low, test your sugar to be sure If your sugar is low (less than 70), then take 15 grams of a fast acting Carbohydrate (3-4 glucose tablets or glucose gel or 4 ounces of juice or regular soda) Recheck your sugar 15 min after treating low to make sure it is more than 70 If sugar is still less than 70, treat again with 15 grams of carbohydrate          Don't drive the hour of hypoglycemia  If unconscious/unable to eat or drink by mouth, use glucagon  injection or nasal spray baqsimi  and call 911. Can repeat again in 15 min if still unconscious.  Return in about 2 weeks (around 01/11/2024) for visit, labs today.   I have reviewed current medications, nurse's notes, allergies, vital signs, past medical and surgical history, family medical history, and social history for this encounter. Counseled patient on symptoms, examination findings, lab findings, imaging results, treatment decisions and monitoring and prognosis. The patient understood the recommendations and agrees with the treatment plan. All questions regarding treatment plan were fully answered.  Obadiah Birmingham, MD  12/28/23    History of Present Illness Katrina Vega is a 68 y.o. year old female who presents for follow up on Type II diabetes mellitus.  ALYCEA SEGOVIANO was first diagnosed around 2010.   Diabetes education +  Home diabetes regimen: Mounjaro  10 mg weekly  Omnipod 5 with DexCom G6 with Humalog  U100  Basal: 2.3u/hr I:C 1:4 ISF 1:25 IOB 3 hrs Target 110, correct above 120  Stopped Ozempic 2mg /week (lost 20 lbs initially, then started gaining back) Stopped Jardiance  10 mg every day due to yeast infections   COMPLICATIONS +  MI/Stroke -  retinopathy +  neuropathy +  nephropathy  BLOOD SUGAR DATA CGM interpretation: At today's visit, we reviewed her CGM downloads. The full report is  scanned in the media. Reviewing the CGM trends, BG are high from 8 PM till 6 AM and improves in between.  Physical Exam  BP 110/80   Pulse 74   Ht 5' 5 (1.651 m)   Wt 270 lb (122.5 kg)   SpO2 96%   BMI 44.93 kg/m    Constitutional: well developed, well nourished Head: normocephalic, atraumatic Eyes: sclera anicteric, no redness Neck: supple Lungs: normal respiratory effort Neurology: alert and oriented Skin: dry, no appreciable rashes Musculoskeletal: no appreciable defects Psychiatric: normal mood and affect Diabetic Foot Exam - Simple   No data filed      Current Medications Patient's Medications  New Prescriptions   CONTINUOUS GLUCOSE SENSOR (DEXCOM G7 SENSOR) MISC    1 Device by Does not apply route continuous.   TIRZEPATIDE  (MOUNJARO ) 12.5 MG/0.5ML PEN    Inject 12.5 mg into the skin once a week.  Previous Medications   ASPIRIN   EC 81 MG TABLET    Take 1 tablet (81 mg total) by mouth daily. Swallow whole.   BREZTRI  AEROSPHERE 160-9-4.8 MCG/ACT AERO INHALER    INHALE 2 INHALATIONS BY MOUTH  TWICE DAILY TO PREVENT COUGH OR  WHEEZE. RINSE, GARGLE, AND SPIT  AFTER USE   BUDESONIDE  (PULMICORT ) 0.5 MG/2ML NEBULIZER SOLUTION    Take 2 mLs (0.5 mg total) by nebulization in the morning and at bedtime. During respiratory infections for 1-2 weeks at a time.   BUMETANIDE  (BUMEX ) 1 MG TABLET    Take 1 tablet (1 mg total) by mouth daily.   CHOLECALCIFEROL 50 MCG (2000 UT) TABS    Take 2,000 Units by mouth daily.   CLOPIDOGREL  (PLAVIX ) 75 MG TABLET    TAKE 1 TABLET BY MOUTH DAILY   CONTINUOUS BLOOD GLUC TRANSMIT (DEXCOM G6 TRANSMITTER) MISC       CONTINUOUS GLUCOSE SENSOR (DEXCOM G6 SENSOR) MISC    1 Device by Does not apply route continuous.   DEXLANSOPRAZOLE (DEXILANT) 60 MG CAPSULE    Take 1 capsule (60 mg total) by mouth daily.   DIAZEPAM  (VALIUM ) 5 MG TABLET    Take 1 tablet (5 mg total) by mouth every 12 (twelve) hours as needed for anxiety.   DIPHENHYDRAMINE (BENADRYL) 50 MG  TABLET    Take 50 mg by mouth daily as needed for itching or allergies.   EPINEPHRINE  (EPIPEN  2-PAK) 0.3 MG/0.3 ML IJ SOAJ INJECTION    Use as directed for severe allergic reactions   FAMOTIDINE  (PEPCID ) 20 MG TABLET    Take 1 tablet (20 mg total) by mouth 2 (two) times daily.   FASENRA  PEN 30 MG/ML PREFILLED AUTOINJECTOR    INJECT 30MG  SUBCUTANEOUSLY EVERY 8 WEEKS   GABAPENTIN  (NEURONTIN ) 300 MG CAPSULE    TAKE 1 CAPSULE BY MOUTH 3 TIMES  DAILY   GLUCAGON  (BAQSIMI  ONE PACK) 3 MG/DOSE POWD    Place 1 Device into the nose as needed (Low blood sugar with impaired consciousness).   GLUCOSE 4 GM CHEWABLE TABLET    Chew 1 tablet by mouth once as needed for low blood sugar.   HUMALOG  100 UNIT/ML INJECTION    Inject up too 200 units into pump daily.   HUMALOG  KWIKPEN 100 UNIT/ML KWIKPEN    INJECT SUBCUTANEOUSLY AS  DIRECTED BASED ON CARB AND  CORRECTION RATIO, MAX DOSE 80  UNITS DAILY   INSULIN  DISPOSABLE PUMP (OMNIPOD 5 DEXG7G6 PODS GEN 5) MISC    1 Device by Does not apply route every other day. Change pod every 1.5 days   INSULIN  PEN NEEDLE (PEN NEEDLES) 32G X 4 MM MISC    1 Device by Does not apply route in the morning, at noon, in the evening, and at bedtime.   IPRATROPIUM (ATROVENT ) 0.03 % NASAL SPRAY    Place 1-2 sprays into both nostrils 2 (two) times daily as needed (nasal drainage).   ISOSORBIDE  MONONITRATE (IMDUR ) 60 MG 24 HR TABLET    TAKE 1 TABLET BY MOUTH DAILY   LANTUS  SOLOSTAR 100 UNIT/ML SOLOSTAR PEN    INJECT SUBCUTANEOUSLY 55 UNITS  DAILY   LEVALBUTEROL  (XOPENEX  HFA) 45 MCG/ACT INHALER    USE 2 INHALATIONS BY MOUTH EVERY 4 TO 6 HOURS AS NEEDED FOR  SHORTNESS OF BREATH , COUGH,  WHEEZE AND TIGHTNESS IN CHEST   LEVALBUTEROL  (XOPENEX ) 0.63 MG/3ML NEBULIZER SOLUTION    Take 3 mLs (0.63 mg total) by nebulization every 4 (four) hours as needed for wheezing or shortness of  breath (coughing fits).   MECLIZINE  (ANTIVERT ) 25 MG TABLET    Take 1 tablet (25 mg total) by mouth 3 (three) times daily as  needed for dizziness.   METHOCARBAMOL  (ROBAXIN ) 500 MG TABLET    Take 1 tablet (500 mg total) by mouth every 6 (six) hours as needed for muscle spasms.   METOPROLOL  TARTRATE (LOPRESSOR ) 50 MG TABLET    TAKE 1 TABLET BY MOUTH EVERY 12  HOURS   MONTELUKAST  (SINGULAIR ) 10 MG TABLET    Take 1 tablet (10 mg total) by mouth at bedtime.   NITROGLYCERIN  (NITROSTAT ) 0.4 MG SL TABLET    Place 1 tablet (0.4 mg total) under the tongue every 5 (five) minutes as needed for chest pain.   PREDNISONE  (DELTASONE ) 10 MG TABLET    Prednisone  10 mg tablets. Take 2 tablets once a day for 4 days, then take 1 tablet on the 5th day, then stop.   RESPIRATORY THERAPY SUPPLIES (CARETOUCH 2 CPAP HOSE HANGER) MISC    by Does not apply route.   ROSUVASTATIN  (CRESTOR ) 40 MG TABLET    TAKE 1 TABLET BY MOUTH AT  BEDTIME  Modified Medications   No medications on file  Discontinued Medications   TIRZEPATIDE  (MOUNJARO ) 10 MG/0.5ML PEN    Inject 10 mg into the skin once a week.    Allergies Allergies  Allergen Reactions   Contrast Media [Iodinated Contrast Media] Shortness Of Breath and Other (See Comments)    sob, chest tight 2024, small amount in joint injection tolerated well   Morphine Itching   Ioversol    Oxycodone  Itching   Tramadol  Itching and Nausea Only    Past Medical History Past Medical History:  Diagnosis Date   Anemia    Arthritis    Asthma    CAD S/P two-vessel DES PCI 2008   LAD and RI; 2013 PTCA of small RCA.   CHF (congestive heart failure), NYHA class I, chronic, diastolic (HCC) 2019   Recent Echo 12/16/2021: Mild concentric LVH.  EF 59%.  GI 1 DD.  Normal LAP-(Mildly dilated LA;; has converted from to torsemide  and now on bumetanide    CKD stage 3 due to type 2 diabetes mellitus (HCC)    COPD (chronic obstructive pulmonary disease) (HCC) 2021   Complicated by asthma and seasonal allergies.   Diabetes mellitus, type II, insulin  dependent (HCC) 2010   On insulin  60 units TID Premeal.  Also on  Jardiance  and Ozempic.   Eczema    Essential hypertension    GERD (gastroesophageal reflux disease)    Heart attack (HCC) 2008   2008-two-vessel PCI; 2013 PTCA only small RCA   History of hiatal hernia    History of left hip replacement 04/2018   Hyperlipidemia associated with type 2 diabetes mellitus (HCC)    140 mg rosuvastatin    Insulin  pump in place    PAD (peripheral artery disease)    Status post bilateral SFA stents and right posterior tibial DES stent   Primary hypertension 01/20/2011      Patient's blood pressure is well controlled.  Continue current medications.    Past Surgical History Past Surgical History:  Procedure Laterality Date   AAA DUPLEX  10/09/2020   Max Aorta (sac) diameter 2.51 cm prox with mild ectasia in Prox Aorta. Diffuse plaque in mid-distal Aorta. Bilateral ICA poorly visulailzed - appear to be Severely stenosed with difuse plaque. - Consider CTA of cath directed Angio.   ABDOMINAL AORTOGRAM N/A 10/08/2023   Procedure: ABDOMINAL AORTOGRAM;  Surgeon: Court Dorn PARAS, MD;  Location: [facility] INVASIVE CV LAB;  Service: Cardiovascular;  Laterality: N/A;   APPENDECTOMY  1974   BIOPSY  02/22/2021   Procedure: BIOPSY;  Surgeon: Rollin Dover, MD;  Location: [facility];  Service: Endoscopy;;   CARDIAC CATHETERIZATION     CARPAL TUNNEL RELEASE  2017   CARPAL TUNNEL RELEASE Right 01/01/2023   Procedure: RIGHT CARPAL TUNNEL RELEASE REVISION;  Surgeon: Arlinda Buster, MD;  Location: [facility];  Service: Orthopedics;  Laterality: Right;   CHOLECYSTECTOMY  1997   pt states removed 1997-1998   COLONOSCOPY WITH PROPOFOL  N/A 02/22/2021   Procedure: COLONOSCOPY WITH PROPOFOL ;  Surgeon: Rollin Dover, MD;  Location: [facility];  Service: Endoscopy;  Laterality: N/A;   CORONARY BALLOON ANGIOPLASTY  2013   PTCA of RCA followed by PCI   CORONARY STENT INTERVENTION  2008   Text DES PCI to LAD and RI/OM1; also prox RCA   ESOPHAGOGASTRODUODENOSCOPY (EGD)  WITH PROPOFOL  N/A 02/22/2021   Procedure: ESOPHAGOGASTRODUODENOSCOPY (EGD) WITH PROPOFOL ;  Surgeon: Rollin Dover, MD;  Location: [facility];  Service: Endoscopy;  Laterality: N/A;   LEA Dopplers  10/09/2020   R mid & Distal SFA -CTO - dampend flow in R Pop via collaterals - Moderate velocity increase in R PFA. No signficant stenosis in LLE.   R ABI 0.97) - normal. L ABI - 0.91 w/ monophasic flow in L AT -> c/w 11/2019- R SFA new.   LEFT HEART CATH AND CORONARY ANGIOGRAPHY  12/22/2017   Mild LM plaque.  Mild proximal LAD plaque.  High D1-40% ostial.  Patent mid LAD DES (Taxus from 2008), large distal LAD free disease; Prox LCx-OM1 stents patent (Taxus 2008). Small RCA - patent prox stent(~50-60% ISR), PTCA site from 2013 - patent   LOWER EXTREMITY ANGIOGRAM Bilateral 12/22/2017   Bilateral SFA stents with evidence of severe right and mid left ISR bilateral popliteal arteries proximal trifurcation vessels are patent.  Moderate to severe lesion involving mid R AntTib, poor distal flow in L Ant Tib - 2 V runoff Bilat. --> referred for R SFA Laser Atherectomy & DCB 5 x 120 mm.   LOWER EXTREMITY ANGIOGRAPHY N/A 10/08/2023   Procedure: Lower Extremity Angiography;  Surgeon: Court Dorn PARAS, MD;  Location[facility] INVASIVE CV LAB;  Service: Cardiovascular;  Laterality: N/A;   LOWER EXTREMITY INTERVENTION Right 12/22/2017   Laser atherectomy of right SFA followed by DCB with 5.0 x 120 mm impact.-For severe right SFA ISR   LOWER EXTREMITY INTERVENTION  10/08/2023   Procedure: LOWER EXTREMITY INTERVENTION;  Surgeon: Court Dorn PARAS, MD;  Location: [facility] INVASIVE CV LAB;  Service: Cardiovascular;;   LUMBAR SPINE SURGERY  2010   NM MYOVIEW  LTD  07/07/2019   [facility]): [place].  Nondiagnostic EKG.  Dyspnea with effusion.  No ischemia or infarction.  Soft tissue attenuation noted.SABRA  LVEF 72%.  No RWMA.  LOW RISK.--No change from 11/2017   POLYPECTOMY  02/22/2021   Procedure: POLYPECTOMY;   Surgeon: Rollin Dover, MD;  Location: [facility];  Service: Endoscopy;;   REPLACEMENT TOTAL KNEE  2017 and 2018   TONSILLECTOMY     TOTAL ABDOMINAL HYSTERECTOMY  1989   TOTAL HIP ARTHROPLASTY  04/2018   TOTAL SHOULDER ARTHROPLASTY  11/2019   TRANSTHORACIC ECHOCARDIOGRAM  07/04/2019   Normal LV size, mild LVH, hyperdynamic LVEF at >65% with grade 1 diastolic dysfunction.  Otherwise no other significant abnormality.  Poor quality due to patient body habitus.   TRANSTHORACIC ECHOCARDIOGRAM  12/16/2021   [facility]) normal LV size and function.  Moderate concentric LVH.  Normal WM.  EF estimated 59%.  GR 1 DD.  Mild LA dilation.  No valvular lesions.    Family History family history includes Breast cancer in her mother; Breast cancer (age of onset: 62 - 59) in her maternal cousin; Eczema in her grandson; Heart attack in her father; Heart disease in her mother; Lung cancer in her father.  Social History Social History   Socioeconomic History   Marital status: Single    Spouse name: Not on file   Number of children: 5   Years of education: Not on file   Highest education level: Associate degree: academic program  Occupational History   Not on file  Tobacco Use   Smoking status: Former    Current packs/day: 0.00    Average packs/day: 0.5 packs/day for 15.0 years (7.5 ttl pk-yrs)    Types: Cigarettes    Start date: 10/13/1980    Quit date: 10/14/1995    Years since quitting: 28.2    Passive exposure: Current   Smokeless tobacco: Never  Vaping Use   Vaping status: Never Used  Substance and Sexual Activity   Alcohol use: Not Currently   Drug use: Not Currently   Sexual activity: Yes  Other Topics Concern   Not on file  Social History Narrative   Not on file   Social Drivers of Health   Financial Resource Strain: Low Risk  (11/04/2023)   Overall Financial Resource Strain (CARDIA)    Difficulty of Paying Living Expenses: Not very hard  Food Insecurity:  No Food Insecurity (11/04/2023)   Hunger Vital Sign    Worried About Running Out of Food in the Last Year: Never true    Ran Out of Food in the Last Year: Never true  Transportation Needs: No Transportation Needs (11/04/2023)   PRAPARE - Administrator, Civil Service (Medical): No    Lack of Transportation (Non-Medical): No  Physical Activity: Insufficiently Active (11/04/2023)   Exercise Vital Sign    Days of Exercise per Week: 5 days    Minutes of Exercise per Session: 20 min  Stress: No Stress Concern Present (11/04/2023)   [employer] of Occupational Health - Occupational Stress Questionnaire    Feeling of Stress: Only a little  Social Connections: Moderately Integrated (11/04/2023)   Social Connection and Isolation Panel    Frequency of Communication with Friends and Family: Three times a week    Frequency of Social Gatherings with Friends and Family: Once a week    Attends Religious Services: More than 4 times per year    Active Member of Clubs or Organizations: Yes    Attends Banker Meetings: More than 4 times per year    Marital Status: Never married  Intimate Partner Violence: Not At Risk (11/04/2023)   Humiliation, Afraid, Rape, and Kick questionnaire    Fear of Current or Ex-Partner: No    Emotionally Abused: No    Physically Abused: No    Sexually Abused: No    Lab Results  Component Value Date   HGBA1C 7.9 (A) 12/28/2023   HGBA1C 8.3 (A) 10/28/2023   HGBA1C 8.2 (A) 06/22/2023   Lab Results  Component Value Date   CHOL 84 10/09/2023   Lab Results  Component Value Date   HDL 28 (L) 10/09/2023   Lab Results  Component Value Date   LDLCALC 38 10/09/2023   Lab Results  Component Value Date   TRIG 88 10/09/2023   Lab Results  Component Value Date   CHOLHDL 3.0 10/09/2023   Lab Results  Component Value Date   CREATININE 1.14 11/26/2023   Lab Results  Component Value Date   GFR 48.99 (L) 11/26/2023   No results found for:  MACKEY CURRENT     Component Value Date/Time   NA 135 11/26/2023 1546   NA 139 09/30/2023 1448   K 4.5 11/26/2023 1546   CL 100 11/26/2023 1546   CO2 27 11/26/2023 1546   GLUCOSE 161 (H) 11/26/2023 1546   BUN 15 11/26/2023 1546   BUN 12 09/30/2023 1448   CREATININE 1.14 11/26/2023 1546   CREATININE 1.02 (H) 05/04/2023 1432   CALCIUM  10.1 11/26/2023 1546   PROT 7.4 11/26/2023 1546   ALBUMIN  4.4 11/26/2023 1546   AST 15 11/26/2023 1546   AST 19 05/04/2023 1432   ALT 16 11/26/2023 1546   ALT 22 05/04/2023 1432   ALKPHOS 70 11/26/2023 1546   BILITOT 0.3 11/26/2023 1546   BILITOT 0.4 05/04/2023 1432   GFRNONAA 45 (L) 10/09/2023 0352   GFRNONAA 60 (L) 05/04/2023 1432      Latest Ref Rng & Units 11/26/2023    3:46 PM 10/09/2023    3:52 AM 09/30/2023    2:48 PM  BMP  Glucose 70 - 99 mg/dL 838  878  846   BUN 6 - 23 mg/dL 15  14  12    Creatinine 0.40 - 1.20 mg/dL 8.85  8.70  8.71   BUN/Creat Ratio 12 - 28   9   Sodium 135 - 145 mEq/L 135  137  139   Potassium 3.5 - 5.1 mEq/L 4.5  3.9  4.4   Chloride 96 - 112 mEq/L 100  107  102   CO2 19 - 32 mEq/L 27  25  20    Calcium  8.4 - 10.5 mg/dL 89.8  9.6  89.9        Component Value Date/Time   WBC 5.9 12/03/2023 1453   WBC 6.7 11/26/2023 1546   RBC 4.45 12/03/2023 1453   RBC 4.44 12/03/2023 1453   HGB 11.4 (L) 12/03/2023 1453   HGB 12.6 09/30/2023 1448   HCT 36.6 12/03/2023 1453   HCT 40.3 09/30/2023 1448   PLT 274 12/03/2023 1453   PLT 291 09/30/2023 1448   MCV 82.2 12/03/2023 1453   MCV 83 09/30/2023 1448   MCH 25.6 (L) 12/03/2023 1453   MCHC 31.1 12/03/2023 1453   RDW 17.1 (H) 12/03/2023 1453   RDW 15.5 (H) 09/30/2023 1448   LYMPHSABS 2.5 12/03/2023 1453   LYMPHSABS 2.3 09/30/2023 1448   MONOABS 0.5 12/03/2023 1453   EOSABS 0.1 12/03/2023 1453   EOSABS 0.0 09/30/2023 1448   BASOSABS 0.0 12/03/2023 1453   BASOSABS 0.0 09/30/2023 1448     Parts of this note may have been dictated using voice recognition  software. There may be variances in spelling and vocabulary which are unintentional. Not all errors are proofread. Please notify the dino if any discrepancies are noted or if the meaning of any statement is not clear.

## 2023-12-28 NOTE — Telephone Encounter (Signed)
   Primary Cardiologist: Alm Clay, MD  Chart reviewed as part of pre-operative protocol coverage. Given past medical history and time since last visit, based on ACC/AHA guidelines, Katrina Vega would be at acceptable risk for the planned procedure without further cardiovascular testing.   Patient should contact our office if she is having new symptoms that are concerning from a cardiac perspective to arrange a follow-up appointment.    Per Dr. Court, after 3 months dual anti-platelet therapy post PV intervention, (after 01/08/24), she may hold aspirin  and Plavix  for 5 days prior to procedure and should resume them as soon as hemodynamically stable as deemed appropriate by surgeon.  I will route this recommendation to the requesting party via Epic fax function and remove from pre-op pool.  Please call with questions.  Katrina EMERSON Bane, NP-C  12/28/2023, 10:07 AM [address], [address] [place], [place] [address] Office [phone] Fax [phone]

## 2023-12-29 LAB — MICROALBUMIN / CREATININE URINE RATIO
Creatinine, Urine: 242 mg/dL (ref 20–275)
Microalb Creat Ratio: 43 mg/g{creat} — ABNORMAL HIGH (ref <30)
Microalb, Ur: 10.3 mg/dL

## 2023-12-30 ENCOUNTER — Other Ambulatory Visit: Payer: Self-pay | Admitting: "Endocrinology

## 2023-12-30 DIAGNOSIS — E1165 Type 2 diabetes mellitus with hyperglycemia: Secondary | ICD-10-CM

## 2024-01-02 ENCOUNTER — Other Ambulatory Visit: Payer: Self-pay | Admitting: Family Medicine

## 2024-01-02 DIAGNOSIS — K219 Gastro-esophageal reflux disease without esophagitis: Secondary | ICD-10-CM

## 2024-01-05 ENCOUNTER — Ambulatory Visit: Admitting: Podiatry

## 2024-01-11 ENCOUNTER — Ambulatory Visit: Admitting: "Endocrinology

## 2024-01-18 ENCOUNTER — Encounter: Payer: Self-pay | Admitting: Gastroenterology

## 2024-01-19 ENCOUNTER — Other Ambulatory Visit: Payer: Self-pay

## 2024-01-19 DIAGNOSIS — M1812 Unilateral primary osteoarthritis of first carpometacarpal joint, left hand: Secondary | ICD-10-CM

## 2024-01-20 ENCOUNTER — Other Ambulatory Visit: Payer: Self-pay | Admitting: Family Medicine

## 2024-01-20 ENCOUNTER — Ambulatory Visit (INDEPENDENT_AMBULATORY_CARE_PROVIDER_SITE_OTHER): Admitting: "Endocrinology

## 2024-01-20 ENCOUNTER — Other Ambulatory Visit: Payer: Self-pay | Admitting: "Endocrinology

## 2024-01-20 ENCOUNTER — Encounter: Payer: Self-pay | Admitting: "Endocrinology

## 2024-01-20 VITALS — BP 130/80 | HR 77 | Ht 65.0 in | Wt 268.0 lb

## 2024-01-20 DIAGNOSIS — Z9641 Presence of insulin pump (external) (internal): Secondary | ICD-10-CM | POA: Diagnosis not present

## 2024-01-20 DIAGNOSIS — E1165 Type 2 diabetes mellitus with hyperglycemia: Secondary | ICD-10-CM | POA: Diagnosis not present

## 2024-01-20 DIAGNOSIS — Z7985 Long-term (current) use of injectable non-insulin antidiabetic drugs: Secondary | ICD-10-CM | POA: Diagnosis not present

## 2024-01-20 DIAGNOSIS — M545 Low back pain, unspecified: Secondary | ICD-10-CM

## 2024-01-20 DIAGNOSIS — Z4681 Encounter for fitting and adjustment of insulin pump: Secondary | ICD-10-CM

## 2024-01-20 DIAGNOSIS — E7800 Pure hypercholesterolemia, unspecified: Secondary | ICD-10-CM

## 2024-01-20 NOTE — Patient Instructions (Signed)

## 2024-01-20 NOTE — Progress Notes (Signed)
 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, + neuropathy, + nephropathy Lab Results  Component Value Date   GFR 48.99 (L) 11/26/2023   Hba1c goal less than 7, current Hba1c is  Lab Results  Component Value Date   HGBA1C 7.9 (A) 12/28/2023   Will recommend the following: Running out of pods but reports insurance only pays for pod change in 2 days, prescription written for change every 1.5 days Omnipod 5 with DexCom G7 with Humalog  U100  Basal: 2.4 u/hr I:C 1:2.5 ISF 1:20 IOB 3 hrs Target 110, correct above 120 Take bolus with coffee  Mounjaro  12.5 mg weekly  Cannot adjust insulin  doses given the patient forgot pump device at home  Previously on, Lantus  60 units once a day Humalog  bolus: 15 min before meals for 1 unit for every 4 grams of carbohydrates  Correction scale: Use in addition to your meal time/short acting insulin  based on blood sugars as follows:  151 - 175: 1 unit 176 - 200: 2 units 201 - 225: 3 units 226 - 250: 4 units 251 - 275: 5 units 276 - 300: 6 units 301 - 325: 7 units 326 - 350: 8 units 351 - 375: 9 units 376 - 400: 10 units   Stopped Ozempic 2mg /week (lost 20 lbs initially, then started gaining back) Jardiance  10 mg every day - stopped taking it due to yeast infections   No history of MEN syndrome/medullary thyroid  cancer/pancreatitis or pancreatic cancer in self or family No known contraindications/side effects to any of above  medications Glucagon  discussed and prescribed with refills on 06/22/23   -Last LD and Tg are as follows: Lab Results  Component Value Date   LDLCALC 38 10/09/2023    Lab Results  Component Value Date   TRIG 88 10/09/2023   -On rosuvastatin  40 mg QD -Follow low fat diet and exercise   -Blood pressure goal <140/90 - Microalbumin/creatinine goal is < 30 -Last MA/Cr is as follows: Lab Results  Component Value Date   MICROALBUR 10.3 12/28/2023    -on ACE/ARB losartan  25 mg every day (prev. on lisinopril  20 mg every day?) -diet changes including salt restriction -limit eating outside -counseled BP targets per standards of diabetes care -uncontrolled blood pressure can lead to retinopathy, nephropathy and cardiovascular and atherosclerotic heart disease  Reviewed and counseled on: -A1C target -Blood sugar targets -Complications of uncontrolled diabetes  -Checking blood sugar before meals and bedtime and bring log next visit -All medications with mechanism of action and side effects -Hypoglycemia management: rule of 15's, Glucagon  Emergency Kit and medical alert ID -low-carb low-fat plate-method diet -At least 20 minutes of physical activity per day -Annual dilated retinal eye exam and foot exam -compliance and follow up needs -follow up as scheduled or earlier if problem gets worse  Call if blood sugar is less than 70 or consistently above 250    Take a 15 gm snack of carbohydrate at bedtime before you go to sleep if your blood sugar is less than  100.    If you are going to fast after midnight for a test or procedure, ask your physician for instructions on how to reduce/decrease your insulin  dose.    Call if blood sugar is less than 70 or consistently above 250  -Treating a low sugar by rule of 15  (15 gms of sugar every 15 min until sugar is more than 70) If you feel your sugar is low, test your sugar to be sure If your sugar is low (less than 70), then take 15 grams of a  fast acting Carbohydrate (3-4 glucose tablets or glucose gel or 4 ounces of juice or regular soda) Recheck your sugar 15 min after treating low to make sure it is more than 70 If sugar is still less than 70, treat again with 15 grams of carbohydrate          Don't drive the hour of hypoglycemia  If unconscious/unable to eat or drink by mouth, use glucagon  injection or nasal spray baqsimi  and call 911. Can repeat again in 15 min if still unconscious.  Return in about 3 months (around 04/21/2024).   I have reviewed current medications, nurse's notes, allergies, vital signs, past medical and surgical history, family medical history, and social history for this encounter. Counseled patient on symptoms, examination findings, lab findings, imaging results, treatment decisions and monitoring and prognosis. The patient understood the recommendations and agrees with the treatment plan. All questions regarding treatment plan were fully answered.  Obadiah Birmingham, MD  01/20/24    History of Present Illness Katrina Vega is a 68 y.o. year old female who presents for follow up on Type II diabetes mellitus.  Katrina Vega was first diagnosed around 2010.   Diabetes education +  Home diabetes regimen: Mounjaro  10 mg weekly  Omnipod 5 with DexCom G7 with Humalog  U100  Basal: 2.3u/hr I:C 1:3 ISF 1:20 IOB 3 hrs Target 110, correct above 120  Stopped Ozempic 2mg /week (lost 20 lbs initially, then started gaining back) Stopped Jardiance  10 mg every day due to yeast infections   COMPLICATIONS +  MI/Stroke -  retinopathy +  neuropathy +  nephropathy  BLOOD SUGAR DATA CGM interpretation: At today's visit, we reviewed her CGM downloads. The full report is scanned in the media. Reviewing the CGM trends, BG are well controlled 75% of the time, and high 25% of the time, across the day spread out particular high early morning.   Physical Exam  BP 130/80   Pulse 77   Ht 5' 5 (1.651 m)   Wt 268 lb  (121.6 kg)   SpO2 95%   BMI 44.60 kg/m    Constitutional: well developed, well nourished Head: normocephalic, atraumatic Eyes: sclera anicteric, no redness Neck: supple Lungs: normal respiratory effort Neurology: alert and oriented Skin: dry, no appreciable rashes Musculoskeletal: no appreciable defects Psychiatric: normal mood and affect Diabetic Foot Exam - Simple   No data filed      Current Medications Patient's Medications  New Prescriptions   No medications on file  Previous Medications   ASPIRIN  EC 81 MG TABLET    Take 1 tablet (81 mg total) by mouth daily. Swallow whole.   BREZTRI  AEROSPHERE 160-9-4.8 MCG/ACT AERO INHALER    INHALE 2 INHALATIONS BY MOUTH  TWICE DAILY TO PREVENT COUGH OR  WHEEZE. RINSE, GARGLE, AND SPIT  AFTER USE   BUDESONIDE  (PULMICORT ) 0.5 MG/2ML NEBULIZER SOLUTION    Take 2 mLs (0.5 mg total) by nebulization in the morning  and at bedtime. During respiratory infections for 1-2 weeks at a time.   BUMETANIDE  (BUMEX ) 1 MG TABLET    Take 1 tablet (1 mg total) by mouth daily.   CHOLECALCIFEROL 50 MCG (2000 UT) TABS    Take 2,000 Units by mouth daily.   CLOPIDOGREL  (PLAVIX ) 75 MG TABLET    TAKE 1 TABLET BY MOUTH DAILY   CONTINUOUS BLOOD GLUC TRANSMIT (DEXCOM G6 TRANSMITTER) MISC       CONTINUOUS GLUCOSE SENSOR (DEXCOM G6 SENSOR) MISC    1 Device by Does not apply route continuous.   CONTINUOUS GLUCOSE SENSOR (DEXCOM G7 SENSOR) MISC    1 Device by Does not apply route continuous.   DEXLANSOPRAZOLE  (DEXILANT ) 60 MG CAPSULE    Take 1 capsule (60 mg total) by mouth daily.   DIAZEPAM  (VALIUM ) 5 MG TABLET    Take 1 tablet (5 mg total) by mouth every 12 (twelve) hours as needed for anxiety.   DIPHENHYDRAMINE (BENADRYL) 50 MG TABLET    Take 50 mg by mouth daily as needed for itching or allergies.   EPINEPHRINE  (EPIPEN  2-PAK) 0.3 MG/0.3 ML IJ SOAJ INJECTION    Use as directed for severe allergic reactions   FAMOTIDINE  (PEPCID ) 20 MG TABLET    Take 1 tablet (20 mg  total) by mouth 2 (two) times daily.   FASENRA  PEN 30 MG/ML PREFILLED AUTOINJECTOR    INJECT 30MG  SUBCUTANEOUSLY EVERY 8 WEEKS   GABAPENTIN  (NEURONTIN ) 300 MG CAPSULE    TAKE 1 CAPSULE BY MOUTH 3 TIMES  DAILY   GLUCAGON  (BAQSIMI  ONE PACK) 3 MG/DOSE POWD    Place 1 Device into the nose as needed (Low blood sugar with impaired consciousness).   GLUCOSE 4 GM CHEWABLE TABLET    Chew 1 tablet by mouth once as needed for low blood sugar.   HUMALOG  100 UNIT/ML INJECTION    INJECT SUBCUTANEOUSLY VIA  INSULIN  PUMP AS DIRECTED .  MAXIMUM DAILY DOSE: 150 UNITS   HUMALOG  KWIKPEN 100 UNIT/ML KWIKPEN    INJECT SUBCUTANEOUSLY AS  DIRECTED BASED ON CARB AND  CORRECTION RATIO, MAX DOSE 80  UNITS DAILY   INSULIN  DISPOSABLE PUMP (OMNIPOD 5 DEXG7G6 PODS GEN 5) MISC    1 Device by Does not apply route every other day. Change pod every 1.5 days   INSULIN  PEN NEEDLE (EMBECTA PEN NEEDLE NANO 2 GEN) 32G X 4 MM MISC    USE 1 NEEDLE TO INJECT  SUBCUTANEOUSLY IN THE MORNING AT NOON IN THE EVENING AND AT  BEDTIME   IPRATROPIUM (ATROVENT ) 0.03 % NASAL SPRAY    Place 1-2 sprays into both nostrils 2 (two) times daily as needed (nasal drainage).   ISOSORBIDE  MONONITRATE (IMDUR ) 60 MG 24 HR TABLET    TAKE 1 TABLET BY MOUTH DAILY   LANTUS  SOLOSTAR 100 UNIT/ML SOLOSTAR PEN    INJECT SUBCUTANEOUSLY 55 UNITS  DAILY   LEVALBUTEROL  (XOPENEX  HFA) 45 MCG/ACT INHALER    USE 2 INHALATIONS BY MOUTH EVERY 4 TO 6 HOURS AS NEEDED FOR  SHORTNESS OF BREATH , COUGH,  WHEEZE AND TIGHTNESS IN CHEST   LEVALBUTEROL  (XOPENEX ) 0.63 MG/3ML NEBULIZER SOLUTION    Take 3 mLs (0.63 mg total) by nebulization every 4 (four) hours as needed for wheezing or shortness of breath (coughing fits).   MECLIZINE  (ANTIVERT ) 25 MG TABLET    Take 1 tablet (25 mg total) by mouth 3 (three) times daily as needed for dizziness.   METHOCARBAMOL  (ROBAXIN ) 500 MG TABLET    Take  1 tablet (500 mg total) by mouth every 6 (six) hours as needed for muscle spasms.   METOPROLOL  TARTRATE  (LOPRESSOR ) 50 MG TABLET    TAKE 1 TABLET BY MOUTH EVERY 12  HOURS   MONTELUKAST  (SINGULAIR ) 10 MG TABLET    Take 1 tablet (10 mg total) by mouth at bedtime.   NITROGLYCERIN  (NITROSTAT ) 0.4 MG SL TABLET    Place 1 tablet (0.4 mg total) under the tongue every 5 (five) minutes as needed for chest pain.   PREDNISONE  (DELTASONE ) 10 MG TABLET    Prednisone  10 mg tablets. Take 2 tablets once a day for 4 days, then take 1 tablet on the 5th day, then stop.   RESPIRATORY THERAPY SUPPLIES (CARETOUCH 2 CPAP HOSE HANGER) MISC    by Does not apply route.   ROSUVASTATIN  (CRESTOR ) 40 MG TABLET    TAKE 1 TABLET BY MOUTH AT  BEDTIME   TIRZEPATIDE  (MOUNJARO ) 12.5 MG/0.5ML PEN    Inject 12.5 mg into the skin once a week.  Modified Medications   No medications on file  Discontinued Medications   No medications on file    Allergies Allergies  Allergen Reactions   Contrast Media [Iodinated Contrast Media] Shortness Of Breath and Other (See Comments)    sob, chest tight 2024, small amount in joint injection tolerated well   Morphine Itching   Ioversol    Oxycodone  Itching   Tramadol  Itching and Nausea Only    Past Medical History Past Medical History:  Diagnosis Date   Anemia    Arthritis    Asthma    CAD S/P two-vessel DES PCI 2008   LAD and RI; 2013 PTCA of small RCA.   CHF (congestive heart failure), NYHA class I, chronic, diastolic (HCC) 2019   Recent Echo 12/16/2021: Mild concentric LVH.  EF 59%.  GI 1 DD.  Normal LAP-(Mildly dilated LA;; has converted from to torsemide  and now on bumetanide    CKD stage 3 due to type 2 diabetes mellitus (HCC)    COPD (chronic obstructive pulmonary disease) (HCC) 2021   Complicated by asthma and seasonal allergies.   Diabetes mellitus, type II, insulin  dependent (HCC) 2010   On insulin  60 units TID Premeal.  Also on Jardiance  and Ozempic.   Eczema    Essential hypertension    GERD (gastroesophageal reflux disease)    Heart attack (HCC) 2008   2008-two-vessel  PCI; 2013 PTCA only small RCA   History of hiatal hernia    History of left hip replacement 04/2018   Hyperlipidemia associated with type 2 diabetes mellitus (HCC)    140 mg rosuvastatin    Insulin  pump in place    PAD (peripheral artery disease)    Status post bilateral SFA stents and right posterior tibial DES stent   Primary hypertension 01/20/2011      Patient's blood pressure is well controlled.  Continue current medications.    Past Surgical History Past Surgical History:  Procedure Laterality Date   AAA DUPLEX  10/09/2020   Max Aorta (sac) diameter 2.51 cm prox with mild ectasia in Prox Aorta. Diffuse plaque in mid-distal Aorta. Bilateral ICA poorly visulailzed - appear to be Severely stenosed with difuse plaque. - Consider CTA of cath directed Angio.   ABDOMINAL AORTOGRAM N/A 10/08/2023   Procedure: ABDOMINAL AORTOGRAM;  Surgeon: Court Dorn PARAS, MD;  Location: [facility] INVASIVE CV LAB;  Service: Cardiovascular;  Laterality: N/A;   APPENDECTOMY  1974   BIOPSY  02/22/2021   Procedure: BIOPSY;  Surgeon:  Rollin Dover, MD;  Location: [facility];  Service: Endoscopy;;   CARDIAC CATHETERIZATION     CARPAL TUNNEL RELEASE  2017   CARPAL TUNNEL RELEASE Right 01/01/2023   Procedure: RIGHT CARPAL TUNNEL RELEASE REVISION;  Surgeon: Arlinda Buster, MD;  Location: [facility];  Service: Orthopedics;  Laterality: Right;   CHOLECYSTECTOMY  1997   pt states removed 1997-1998   COLONOSCOPY WITH PROPOFOL  N/A 02/22/2021   Procedure: COLONOSCOPY WITH PROPOFOL ;  Surgeon: Rollin Dover, MD;  Location: [facility];  Service: Endoscopy;  Laterality: N/A;   CORONARY BALLOON ANGIOPLASTY  2013   PTCA of RCA followed by PCI   CORONARY STENT INTERVENTION  2008   Text DES PCI to LAD and RI/OM1; also prox RCA   ESOPHAGOGASTRODUODENOSCOPY (EGD) WITH PROPOFOL  N/A 02/22/2021   Procedure: ESOPHAGOGASTRODUODENOSCOPY (EGD) WITH PROPOFOL ;  Surgeon: Rollin Dover, MD;  Location: [facility];   Service: Endoscopy;  Laterality: N/A;   LEA Dopplers  10/09/2020   R mid & Distal SFA -CTO - dampend flow in R Pop via collaterals - Moderate velocity increase in R PFA. No signficant stenosis in LLE.   R ABI 0.97) - normal. L ABI - 0.91 w/ monophasic flow in L AT -> c/w 11/2019- R SFA new.   LEFT HEART CATH AND CORONARY ANGIOGRAPHY  12/22/2017   Mild LM plaque.  Mild proximal LAD plaque.  High D1-40% ostial.  Patent mid LAD DES (Taxus from 2008), large distal LAD free disease; Prox LCx-OM1 stents patent (Taxus 2008). Small RCA - patent prox stent(~50-60% ISR), PTCA site from 2013 - patent   LOWER EXTREMITY ANGIOGRAM Bilateral 12/22/2017   Bilateral SFA stents with evidence of severe right and mid left ISR bilateral popliteal arteries proximal trifurcation vessels are patent.  Moderate to severe lesion involving mid R AntTib, poor distal flow in L Ant Tib - 2 V runoff Bilat. --> referred for R SFA Laser Atherectomy & DCB 5 x 120 mm.   LOWER EXTREMITY ANGIOGRAPHY N/A 10/08/2023   Procedure: Lower Extremity Angiography;  Surgeon: Court Dorn PARAS, MD;  Location[facility] INVASIVE CV LAB;  Service: Cardiovascular;  Laterality: N/A;   LOWER EXTREMITY INTERVENTION Right 12/22/2017   Laser atherectomy of right SFA followed by DCB with 5.0 x 120 mm impact.-For severe right SFA ISR   LOWER EXTREMITY INTERVENTION  10/08/2023   Procedure: LOWER EXTREMITY INTERVENTION;  Surgeon: Court Dorn PARAS, MD;  Location: [facility] INVASIVE CV LAB;  Service: Cardiovascular;;   LUMBAR SPINE SURGERY  2010   NM MYOVIEW  LTD  07/07/2019   [facility]): [place].  Nondiagnostic EKG.  Dyspnea with effusion.  No ischemia or infarction.  Soft tissue attenuation noted.SABRA  LVEF 72%.  No RWMA.  LOW RISK.--No change from 11/2017   POLYPECTOMY  02/22/2021   Procedure: POLYPECTOMY;  Surgeon: Rollin Dover, MD;  Location: [facility];  Service: Endoscopy;;   REPLACEMENT TOTAL KNEE  2017 and 2018   TONSILLECTOMY     TOTAL  ABDOMINAL HYSTERECTOMY  1989   TOTAL HIP ARTHROPLASTY  04/2018   TOTAL SHOULDER ARTHROPLASTY  11/2019   TRANSTHORACIC ECHOCARDIOGRAM  07/04/2019   Normal LV size, mild LVH, hyperdynamic LVEF at >65% with grade 1 diastolic dysfunction.  Otherwise no other significant abnormality.  Poor quality due to patient body habitus.   TRANSTHORACIC ECHOCARDIOGRAM  12/16/2021   [facility]) normal LV size and function.  Moderate concentric LVH.  Normal WM.  EF estimated 59%.  GR 1 DD.  Mild LA dilation.  No  valvular lesions.    Family History family history includes Breast cancer in her mother; Breast cancer (age of onset: 67 - 51) in her maternal cousin; Eczema in her grandson; Heart attack in her father; Heart disease in her mother; Lung cancer in her father.  Social History Social History   Socioeconomic History   Marital status: Single    Spouse name: Not on file   Number of children: 5   Years of education: Not on file   Highest education level: Associate degree: academic program  Occupational History   Not on file  Tobacco Use   Smoking status: Former    Current packs/day: 0.00    Average packs/day: 0.5 packs/day for 15.0 years (7.5 ttl pk-yrs)    Types: Cigarettes    Start date: 10/13/1980    Quit date: 10/14/1995    Years since quitting: 28.2    Passive exposure: Current   Smokeless tobacco: Never  Vaping Use   Vaping status: Never Used  Substance and Sexual Activity   Alcohol use: Not Currently   Drug use: Not Currently   Sexual activity: Yes  Other Topics Concern   Not on file  Social History Narrative   Not on file   Social Drivers of Health   Financial Resource Strain: Low Risk  (11/04/2023)   Overall Financial Resource Strain (CARDIA)    Difficulty of Paying Living Expenses: Not very hard  Food Insecurity: No Food Insecurity (11/04/2023)   Hunger Vital Sign    Worried About Running Out of Food in the Last Year: Never true    Ran Out of Food in  the Last Year: Never true  Transportation Needs: No Transportation Needs (11/04/2023)   PRAPARE - Administrator, Civil Service (Medical): No    Lack of Transportation (Non-Medical): No  Physical Activity: Insufficiently Active (11/04/2023)   Exercise Vital Sign    Days of Exercise per Week: 5 days    Minutes of Exercise per Session: 20 min  Stress: No Stress Concern Present (11/04/2023)   [employer] of Occupational Health - Occupational Stress Questionnaire    Feeling of Stress: Only a little  Social Connections: Moderately Integrated (11/04/2023)   Social Connection and Isolation Panel    Frequency of Communication with Friends and Family: Three times a week    Frequency of Social Gatherings with Friends and Family: Once a week    Attends Religious Services: More than 4 times per year    Active Member of Clubs or Organizations: Yes    Attends Banker Meetings: More than 4 times per year    Marital Status: Never married  Intimate Partner Violence: Not At Risk (11/04/2023)   Humiliation, Afraid, Rape, and Kick questionnaire    Fear of Current or Ex-Partner: No    Emotionally Abused: No    Physically Abused: No    Sexually Abused: No    Lab Results  Component Value Date   HGBA1C 7.9 (A) 12/28/2023   HGBA1C 8.3 (A) 10/28/2023   HGBA1C 8.2 (A) 06/22/2023   Lab Results  Component Value Date   CHOL 84 10/09/2023   Lab Results  Component Value Date   HDL 28 (L) 10/09/2023   Lab Results  Component Value Date   LDLCALC 38 10/09/2023   Lab Results  Component Value Date   TRIG 88 10/09/2023   Lab Results  Component Value Date   CHOLHDL 3.0 10/09/2023   Lab Results  Component Value Date  CREATININE 1.14 11/26/2023   Lab Results  Component Value Date   GFR 48.99 (L) 11/26/2023   Lab Results  Component Value Date   MICROALBUR 10.3 12/28/2023       Component Value Date/Time   NA 135 11/26/2023 1546   NA 139 09/30/2023 1448   K 4.5  11/26/2023 1546   CL 100 11/26/2023 1546   CO2 27 11/26/2023 1546   GLUCOSE 161 (H) 11/26/2023 1546   BUN 15 11/26/2023 1546   BUN 12 09/30/2023 1448   CREATININE 1.14 11/26/2023 1546   CREATININE 1.02 (H) 05/04/2023 1432   CALCIUM  10.1 11/26/2023 1546   PROT 7.4 11/26/2023 1546   ALBUMIN  4.4 11/26/2023 1546   AST 15 11/26/2023 1546   AST 19 05/04/2023 1432   ALT 16 11/26/2023 1546   ALT 22 05/04/2023 1432   ALKPHOS 70 11/26/2023 1546   BILITOT 0.3 11/26/2023 1546   BILITOT 0.4 05/04/2023 1432   GFRNONAA 45 (L) 10/09/2023 0352   GFRNONAA 60 (L) 05/04/2023 1432      Latest Ref Rng & Units 11/26/2023    3:46 PM 10/09/2023    3:52 AM 09/30/2023    2:48 PM  BMP  Glucose 70 - 99 mg/dL 838  878  846   BUN 6 - 23 mg/dL 15  14  12    Creatinine 0.40 - 1.20 mg/dL 8.85  8.70  8.71   BUN/Creat Ratio 12 - 28   9   Sodium 135 - 145 mEq/L 135  137  139   Potassium 3.5 - 5.1 mEq/L 4.5  3.9  4.4   Chloride 96 - 112 mEq/L 100  107  102   CO2 19 - 32 mEq/L 27  25  20    Calcium  8.4 - 10.5 mg/dL 89.8  9.6  89.9        Component Value Date/Time   WBC 5.9 12/03/2023 1453   WBC 6.7 11/26/2023 1546   RBC 4.45 12/03/2023 1453   RBC 4.44 12/03/2023 1453   HGB 11.4 (L) 12/03/2023 1453   HGB 12.6 09/30/2023 1448   HCT 36.6 12/03/2023 1453   HCT 40.3 09/30/2023 1448   PLT 274 12/03/2023 1453   PLT 291 09/30/2023 1448   MCV 82.2 12/03/2023 1453   MCV 83 09/30/2023 1448   MCH 25.6 (L) 12/03/2023 1453   MCHC 31.1 12/03/2023 1453   RDW 17.1 (H) 12/03/2023 1453   RDW 15.5 (H) 09/30/2023 1448   LYMPHSABS 2.5 12/03/2023 1453   LYMPHSABS 2.3 09/30/2023 1448   MONOABS 0.5 12/03/2023 1453   EOSABS 0.1 12/03/2023 1453   EOSABS 0.0 09/30/2023 1448   BASOSABS 0.0 12/03/2023 1453   BASOSABS 0.0 09/30/2023 1448     Parts of this note may have been dictated using voice recognition software. There may be variances in spelling and vocabulary which are unintentional. Not all errors are proofread. Please  notify the dino if any discrepancies are noted or if the meaning of any statement is not clear.

## 2024-01-25 ENCOUNTER — Other Ambulatory Visit: Payer: Self-pay

## 2024-01-25 ENCOUNTER — Encounter: Payer: Self-pay | Admitting: Family

## 2024-01-25 ENCOUNTER — Ambulatory Visit: Admitting: Allergy

## 2024-01-25 ENCOUNTER — Encounter: Payer: Self-pay | Admitting: Allergy

## 2024-01-25 VITALS — BP 124/70 | HR 80 | Temp 98.2°F | Resp 14 | Ht 65.0 in | Wt 266.5 lb

## 2024-01-25 DIAGNOSIS — K219 Gastro-esophageal reflux disease without esophagitis: Secondary | ICD-10-CM | POA: Diagnosis not present

## 2024-01-25 DIAGNOSIS — J310 Chronic rhinitis: Secondary | ICD-10-CM

## 2024-01-25 DIAGNOSIS — J4489 Other specified chronic obstructive pulmonary disease: Secondary | ICD-10-CM | POA: Diagnosis not present

## 2024-01-25 MED ORDER — IPRATROPIUM BROMIDE 0.03 % NA SOLN: 1.0000 | Freq: Two times a day (BID) | NASAL | 3 refills | Status: AC | PRN

## 2024-01-25 NOTE — Progress Notes (Signed)
 Follow Up Note  RE: Katrina Vega MRN: [id number] DOB: 1956-01-01 Date of Office Visit: 01/25/2024  Referring provider: Antonio Vega, Katrina Vega, * Primary care provider: Antonio Vega, Katrina SAUNDERS, DO  Chief Complaint: Follow-up (She has allergies flared up: runny and stuffy nose. She would like to ask about allergy  test. Her asthma is still coughing, SOB and wheezing. )  History of Present Illness: I had the pleasure of seeing Katrina Vega for a follow up visit at the [facility] on 01/25/2024. She is a 68 y.o. female, who is being followed for rhinitis, asthma/COPD overlap syndrome, GERD. Her previous allergy  office visit was on 11/05/2023 with Katrina Mutter, FNP. Today is a regular follow up visit.  Discussed the use of AI scribe software for clinical note transcription with the patient, who gave verbal consent to proceed.    She has experienced worsening shortness of breath over the past four months, particularly with minimal exertion such as moving laundry or bending over, but not at work where she walks longer distances. The shortness of breath resolves after resting for about five to ten minutes.   She continues to use Breztri , two puffs twice a day, but is uncertain of its effectiveness. She has used her nebulizer three times since her last visit, which she finds helpful. She has a history of using prednisone , which was prescribed during a video visit, but she is cautious with its use due to her diabetes and the need to monitor blood sugar levels.  She has a history of esophageal narrowing, previously treated with dilation. She suspects her esophagus may contribute to her breathing difficulties, especially when eating, as food sometimes 'tickles' her throat, causing coughing and breathlessness. She has an upcoming appointment with a gastroenterologist in January to further investigate this issue.  She is currently on Dexilant  for stomach issues, prescribed by another doctor.  She takes Singulair  (montelukast ) every night and recently stopped taking levocetirizine. She has been on Fasenra  injections every eight weeks and recently had one last month. She is also on Mounjaro  for weight management, but feels her weight is not changing significantly.  She mentions a recent trip to [place], involving a long drive, without increased shortness of breath following the trip. She uses a humidifier at home to manage dry air, which she finds helpful. She reports occasional wheezing and has experienced nasal drainage issues, for which she was prescribed a new nasal spray, but is unsure if she has received it.   Patient going to have left wrist surgery.    Assessment and Plan: Katrina Vega is a 68 y.o. female with: Chronic rhinitis  Past history - started on AIT in [place] in 2021 [facility]). Stopped AIT due to persistent coughing. 2025 skin testing negative to indoor/outdoor allergens but the positive control was negative x 2 questioning the validity of the results. 2025 labs negative to indoor/outdoor allergens.  Interim history - stopped Xyzal  with no change in symptoms. Not sure if she ever got ipratropium nasal spray. Use Nasacort  (triamcinolone ) nasal spray 1 spray per nostril twice a day as needed for nasal congestion.  Use Atrovent  (ipratropium) 0.03% 1-2 sprays per nostril twice a day as needed for runny nose/drainage. Nasal saline spray (i.e., Simply Saline) or nasal saline lavage (i.e., NeilMed) is recommended as needed and prior to medicated nasal sprays. Use refresh eye drops as needed.   Asthma-COPD overlap syndrome Past history - triggered by strong scents at work. Albuterol  caused shaking/palpitations.  Interim history - increased exertional dyspnea. Differential includes deconditioning and esophageal issues. Today's spirometry was unremarkable. Daily controller medication(s): Breztri  2 puffs twice a day with spacer and rinse mouth afterwards. Fasenra  injections  every 8 weeks. Continue Singulair  (montelukast ) 10mg  daily at night. During respiratory infections/flares:  Start Pulmicort  (budesonide ) 0.5mg  nebulizer twice a day for 1-2 weeks until your breathing symptoms return to baseline.  Pretreat with levoalbuterol 2 puffs or levoalbuterol nebulizer.  If you need to use your levoalbuterol nebulizer machine back to back within 15-30 minutes with no relief then please go to the ER/urgent care for further evaluation.  May use levoalbuterol rescue inhaler 2 puffs or nebulizer every 4 to 6 hours as needed for shortness of breath, chest tightness, coughing, and wheezing. May use levoalbuterol rescue inhaler 2 puffs 5 to 15 minutes prior to strenuous physical activities. Monitor frequency of use - if you need to use it more than twice per week on a consistent basis let us  know.  If still having issues and GI work up negative then will consider switching biologics.    Gastroesophageal reflux disease, unspecified whether esophagitis present Worsening symptoms. Continue lifestyle and dietary modifications. Continue Dexilant  daily. Make sure you see GI in January.   Return in about 4 months (around 05/25/2024).  Meds ordered this encounter  Medications   ipratropium (ATROVENT ) 0.03 % nasal spray    Sig: Place 1-2 sprays into both nostrils 2 (two) times daily as needed (nasal drainage).    Dispense:  30 mL    Refill:  3   Lab Orders  No laboratory test(s) ordered today    Diagnostics: Spirometry:  Tracings reviewed. Her effort: It was hard to get consistent efforts and there is a question as to whether this reflects a maximal maneuver. FVC: 1.99L FEV1: 1.68L, 84% predicted FEV1/FVC ratio: 84% Interpretation: No overt abnormalities noted given today's efforts.  Please see scanned spirometry results for details.  Results discussed with patient/family.   Medication List:  Current Outpatient Medications  Medication Sig Dispense Refill   aspirin  EC  81 MG tablet Take 1 tablet (81 mg total) by mouth daily. Swallow whole. 90 tablet 1   BREZTRI  AEROSPHERE 160-9-4.8 MCG/ACT AERO inhaler INHALE 2 INHALATIONS BY MOUTH  TWICE DAILY TO PREVENT COUGH OR  WHEEZE. RINSE, GARGLE, AND SPIT  AFTER USE 32.1 g 3   budesonide  (PULMICORT ) 0.5 MG/2ML nebulizer solution Take 2 mLs (0.5 mg total) by nebulization in the morning and at bedtime. During respiratory infections for 1-2 weeks at a time. 120 mL 2   bumetanide  (BUMEX ) 1 MG tablet Take 1 tablet (1 mg total) by mouth daily. 100 tablet 3   Cholecalciferol 50 MCG (2000 UT) TABS Take 2,000 Units by mouth daily.     clopidogrel  (PLAVIX ) 75 MG tablet TAKE 1 TABLET BY MOUTH DAILY 90 tablet 3   Continuous Blood Gluc Transmit (DEXCOM G6 TRANSMITTER) MISC      Continuous Glucose Sensor (DEXCOM G6 SENSOR) MISC 1 Device by Does not apply route continuous. 9 each 3   Continuous Glucose Sensor (DEXCOM G7 SENSOR) MISC 1 Device by Does not apply route continuous. 9 each 3   dexlansoprazole  (DEXILANT ) 60 MG capsule Take 1 capsule (60 mg total) by mouth daily. 90 capsule 0   diazepam  (VALIUM ) 5 MG tablet Take 1 tablet (5 mg total) by mouth every 12 (twelve) hours as needed for anxiety. 5 tablet 0   diphenhydrAMINE (BENADRYL) 50 MG tablet Take 50 mg by mouth daily as  needed for itching or allergies.     EPINEPHrine  (EPIPEN  2-PAK) 0.3 mg/0.3 mL IJ SOAJ injection Use as directed for severe allergic reactions 2 each 3   famotidine  (PEPCID ) 20 MG tablet Take 1 tablet (20 mg total) by mouth 2 (two) times daily. 60 tablet 3   FASENRA  PEN 30 MG/ML prefilled autoinjector INJECT 30MG  SUBCUTANEOUSLY EVERY 8 WEEKS 1 mL 6   gabapentin  (NEURONTIN ) 300 MG capsule TAKE 1 CAPSULE BY MOUTH 3 TIMES  DAILY 300 capsule 1   Glucagon  (BAQSIMI  ONE PACK) 3 MG/DOSE POWD Place 1 Device into the nose as needed (Low blood sugar with impaired consciousness). 2 each 3   glucose 4 GM chewable tablet Chew 1 tablet by mouth once as needed for low blood sugar.      HUMALOG  100 UNIT/ML injection INJECT SUBCUTANEOUSLY VIA  INSULIN  PUMP AS DIRECTED .  MAXIMUM DAILY DOSE: 150 UNITS 90 mL 5   HUMALOG  KWIKPEN 100 UNIT/ML KwikPen INJECT SUBCUTANEOUSLY AS  DIRECTED BASED ON CARB AND  CORRECTION RATIO, MAX DOSE 80  UNITS DAILY 45 mL 5   Insulin  Disposable Pump (OMNIPOD 5 DEXG7G6 PODS GEN 5) MISC 1 Device by Does not apply route every other day. Change pod every 1.5 days 60 each 3   Insulin  Pen Needle (EMBECTA PEN NEEDLE NANO 2 GEN) 32G X 4 MM MISC USE 1 NEEDLE TO INJECT  SUBCUTANEOUSLY IN THE MORNING AT NOON IN THE EVENING AND AT  BEDTIME 300 each 2   isosorbide  mononitrate (IMDUR ) 60 MG 24 hr tablet TAKE 1 TABLET BY MOUTH DAILY 90 tablet 3   LANTUS  SOLOSTAR 100 UNIT/ML Solostar Pen INJECT SUBCUTANEOUSLY 55 UNITS  DAILY 45 mL 3   levalbuterol  (XOPENEX  HFA) 45 MCG/ACT inhaler USE 2 INHALATIONS BY MOUTH EVERY 4 TO 6 HOURS AS NEEDED FOR  SHORTNESS OF BREATH , COUGH,  WHEEZE AND TIGHTNESS IN CHEST 45 g 6   levalbuterol  (XOPENEX ) 0.63 MG/3ML nebulizer solution Take 3 mLs (0.63 mg total) by nebulization every 4 (four) hours as needed for wheezing or shortness of breath (coughing fits). 75 mL 1   meclizine  (ANTIVERT ) 25 MG tablet Take 1 tablet (25 mg total) by mouth 3 (three) times daily as needed for dizziness. 30 tablet 0   methocarbamol  (ROBAXIN ) 500 MG tablet Take 1 tablet (500 mg total) by mouth every 6 (six) hours as needed for muscle spasms. 30 tablet 2   metoprolol  tartrate (LOPRESSOR ) 50 MG tablet TAKE 1 TABLET BY MOUTH EVERY 12  HOURS 180 tablet 3   montelukast  (SINGULAIR ) 10 MG tablet Take 1 tablet (10 mg total) by mouth at bedtime. 90 tablet 3   nitroGLYCERIN  (NITROSTAT ) 0.4 MG SL tablet Place 1 tablet (0.4 mg total) under the tongue every 5 (five) minutes as needed for chest pain. 25 tablet 3   Respiratory Therapy Supplies (CARETOUCH 2 CPAP HOSE HANGER) MISC by Does not apply route.     rosuvastatin  (CRESTOR ) 40 MG tablet TAKE 1 TABLET BY MOUTH AT  BEDTIME 90  tablet 3   tirzepatide  (MOUNJARO ) 12.5 MG/0.5ML Pen Inject 12.5 mg into the skin once a week. 2 mL 0   ipratropium (ATROVENT ) 0.03 % nasal spray Place 1-2 sprays into both nostrils 2 (two) times daily as needed (nasal drainage). 30 mL 3   Current Facility-Administered Medications  Medication Dose Route Frequency Provider Last Rate Last Admin   Benralizumab  SOSY 30 mg  30 mg Subcutaneous Q8 Weeks Dale, Christine, FNP   30 mg at 12/28/23 1432  Allergies: Allergies  Allergen Reactions   Contrast Media [Iodinated Contrast Media] Shortness Of Breath and Other (See Comments)    sob, chest tight 2024, small amount in joint injection tolerated well   Morphine Itching   Ioversol    Oxycodone  Itching   Tramadol  Itching and Nausea Only   I reviewed her past medical history, social history, family history, and environmental history and no significant changes have been reported from her previous visit.  Review of Systems  Constitutional:  Negative for appetite change, chills, fever and unexpected weight change.  HENT:  Positive for postnasal drip and rhinorrhea. Negative for congestion.   Eyes:  Negative for itching.  Respiratory:  Positive for cough and shortness of breath. Negative for wheezing.   Cardiovascular:  Negative for chest pain.  Gastrointestinal:  Negative for abdominal pain.  Genitourinary:  Negative for difficulty urinating.  Skin:  Negative for rash.  Allergic/Immunologic: Negative for environmental allergies.  Neurological:  Negative for headaches.    Objective: BP 124/70 (BP Location: Right Arm, Patient Position: Sitting, Cuff Size: Normal)   Pulse 80   Temp 98.2 F (36.8 C) (Temporal)   Resp 14   Ht 5' 5 (1.651 m)   Wt 266 lb 8 oz (120.9 kg)   SpO2 96%   BMI 44.35 kg/m  Body mass index is 44.35 kg/m. Physical Exam Vitals and nursing note reviewed.  Constitutional:      Appearance: Normal appearance. She is well-developed.  HENT:     Head: Normocephalic and  atraumatic.     Right Ear: Tympanic membrane and external ear normal.     Left Ear: Tympanic membrane and external ear normal.     Nose: Nose normal.     Mouth/Throat:     Mouth: Mucous membranes are moist.     Pharynx: Oropharynx is clear.  Eyes:     Conjunctiva/sclera: Conjunctivae normal.  Cardiovascular:     Rate and Rhythm: Normal rate and regular rhythm.     Heart sounds: Normal heart sounds. No murmur heard.    No friction rub. No gallop.  Pulmonary:     Effort: Pulmonary effort is normal.     Breath sounds: Normal breath sounds. No wheezing, rhonchi or rales.  Musculoskeletal:     Cervical back: Neck supple.     Comments: Left wrist in brace.  Skin:    General: Skin is warm.     Findings: No rash.  Neurological:     Mental Status: She is alert and oriented to person, place, and time.  Psychiatric:        Behavior: Behavior normal.    Previous notes and tests were reviewed. The plan was reviewed with the patient/family, and all questions/concerned were addressed.  It was my pleasure to see Katrina Vega today and participate in her care. Please feel free to contact me with any questions or concerns.  Sincerely,  Orlan Cramp, DO Allergy  & Immunology  [facility]  [place] office: [phone] [facility] office: [phone]

## 2024-01-25 NOTE — Telephone Encounter (Signed)
 Requested Prescriptions   Pending Prescriptions Disp Refills   HUMALOG  100 UNIT/ML injection [Pharmacy Med Name: HumaLOG  100 UNIT/ML Subcutaneous Solution] 90 mL 5    Sig: INJECT SUBCUTANEOUSLY VIA  INSULIN  PUMP AS DIRECTED .  MAXIMUM DAILY DOSE: 150 UNITS

## 2024-01-25 NOTE — Patient Instructions (Addendum)
 Rhinitis  Use Nasacort  (triamcinolone ) nasal spray 1 spray per nostril twice a day as needed for nasal congestion.  Use Atrovent  (ipratropium) 0.03% 1-2 sprays per nostril twice a day as needed for runny nose/drainage. Nasal saline spray (i.e., Simply Saline) or nasal saline lavage (i.e., NeilMed) is recommended as needed and prior to medicated nasal sprays. Use refresh eye drops as needed.  Asthma Daily controller medication(s): Breztri  2 puffs twice a day with spacer and rinse mouth afterwards. Fasenra  injections every 8 weeks. Continue Singulair  (montelukast ) 10mg  daily at night. During respiratory infections/flares:  Start Pulmicort  (budesonide ) 0.5mg  nebulizer twice a day for 1-2 weeks until your breathing symptoms return to baseline.  Pretreat with levoalbuterol 2 puffs or levoalbuterol nebulizer.  If you need to use your levoalbuterol nebulizer machine back to back within 15-30 minutes with no relief then please go to the ER/urgent care for further evaluation.  May use levoalbuterol rescue inhaler 2 puffs or nebulizer every 4 to 6 hours as needed for shortness of breath, chest tightness, coughing, and wheezing. May use levoalbuterol rescue inhaler 2 puffs 5 to 15 minutes prior to strenuous physical activities. Monitor frequency of use - if you need to use it more than twice per week on a consistent basis let us  know.  Breathing control goals:  Full participation in all desired activities (may need albuterol  before activity) Albuterol  use two times or less a week on average (not counting use with activity) Cough interfering with sleep two times or less a month Oral steroids no more than once a year No hospitalizations   Reflux Continue lifestyle and dietary modifications. Continue Dexilant  daily. Make sure you see GI in January.    Return in about 4 months (around 05/25/2024). Or sooner if needed.

## 2024-01-26 ENCOUNTER — Encounter: Payer: Self-pay | Admitting: "Endocrinology

## 2024-02-02 ENCOUNTER — Encounter (HOSPITAL_BASED_OUTPATIENT_CLINIC_OR_DEPARTMENT_OTHER): Payer: Self-pay | Admitting: Orthopedic Surgery

## 2024-02-02 ENCOUNTER — Encounter: Payer: Self-pay | Admitting: Orthopedic Surgery

## 2024-02-02 ENCOUNTER — Ambulatory Visit: Admitting: Cardiovascular Disease

## 2024-02-02 ENCOUNTER — Other Ambulatory Visit: Payer: Self-pay | Admitting: Family Medicine

## 2024-02-02 ENCOUNTER — Other Ambulatory Visit: Payer: Self-pay

## 2024-02-02 DIAGNOSIS — K219 Gastro-esophageal reflux disease without esophagitis: Secondary | ICD-10-CM

## 2024-02-03 ENCOUNTER — Encounter (HOSPITAL_BASED_OUTPATIENT_CLINIC_OR_DEPARTMENT_OTHER)
Admission: RE | Admit: 2024-02-03 | Discharge: 2024-02-03 | Disposition: A | Discharge status: Home / Self Care | Source: Ambulatory Visit | Attending: Orthopedic Surgery | Admitting: Orthopedic Surgery

## 2024-02-03 DIAGNOSIS — J4489 Other specified chronic obstructive pulmonary disease: Secondary | ICD-10-CM | POA: Diagnosis not present

## 2024-02-03 DIAGNOSIS — M19042 Primary osteoarthritis, left hand: Secondary | ICD-10-CM | POA: Diagnosis not present

## 2024-02-03 DIAGNOSIS — I252 Old myocardial infarction: Secondary | ICD-10-CM | POA: Diagnosis not present

## 2024-02-03 DIAGNOSIS — K219 Gastro-esophageal reflux disease without esophagitis: Secondary | ICD-10-CM | POA: Diagnosis not present

## 2024-02-03 DIAGNOSIS — Z87891 Personal history of nicotine dependence: Secondary | ICD-10-CM | POA: Diagnosis not present

## 2024-02-03 DIAGNOSIS — I5032 Chronic diastolic (congestive) heart failure: Secondary | ICD-10-CM | POA: Diagnosis not present

## 2024-02-03 DIAGNOSIS — I130 Hypertensive heart and chronic kidney disease with heart failure and stage 1 through stage 4 chronic kidney disease, or unspecified chronic kidney disease: Secondary | ICD-10-CM | POA: Diagnosis not present

## 2024-02-03 DIAGNOSIS — Z794 Long term (current) use of insulin: Secondary | ICD-10-CM | POA: Diagnosis not present

## 2024-02-03 DIAGNOSIS — G5602 Carpal tunnel syndrome, left upper limb: Secondary | ICD-10-CM | POA: Diagnosis present

## 2024-02-03 DIAGNOSIS — E1122 Type 2 diabetes mellitus with diabetic chronic kidney disease: Secondary | ICD-10-CM | POA: Diagnosis not present

## 2024-02-03 DIAGNOSIS — N183 Chronic kidney disease, stage 3 unspecified: Secondary | ICD-10-CM | POA: Diagnosis not present

## 2024-02-03 DIAGNOSIS — E1151 Type 2 diabetes mellitus with diabetic peripheral angiopathy without gangrene: Secondary | ICD-10-CM | POA: Diagnosis not present

## 2024-02-03 DIAGNOSIS — I2510 Atherosclerotic heart disease of native coronary artery without angina pectoris: Secondary | ICD-10-CM | POA: Diagnosis not present

## 2024-02-03 DIAGNOSIS — G4730 Sleep apnea, unspecified: Secondary | ICD-10-CM | POA: Diagnosis not present

## 2024-02-03 DIAGNOSIS — Z955 Presence of coronary angioplasty implant and graft: Secondary | ICD-10-CM | POA: Diagnosis not present

## 2024-02-03 DIAGNOSIS — M1812 Unilateral primary osteoarthritis of first carpometacarpal joint, left hand: Secondary | ICD-10-CM | POA: Diagnosis present

## 2024-02-03 DIAGNOSIS — Z7985 Long-term (current) use of injectable non-insulin antidiabetic drugs: Secondary | ICD-10-CM | POA: Diagnosis not present

## 2024-02-03 LAB — BASIC METABOLIC PANEL WITH GFR
Anion gap: 9 (ref 5–15)
BUN: 12 mg/dL (ref 8–23)
CO2: 25 mmol/L (ref 22–32)
Calcium: 9.5 mg/dL (ref 8.9–10.3)
Chloride: 106 mmol/L (ref 98–111)
Creatinine, Ser: 1.09 mg/dL — ABNORMAL HIGH (ref 0.44–1.00)
GFR, Estimated: 55 mL/min — ABNORMAL LOW (ref >60)
Glucose, Bld: 185 mg/dL — ABNORMAL HIGH (ref 70–99)
Potassium: 4.4 mmol/L (ref 3.5–5.1)
Sodium: 140 mmol/L (ref 135–145)

## 2024-02-03 NOTE — Progress Notes (Signed)

## 2024-02-04 ENCOUNTER — Other Ambulatory Visit: Payer: Self-pay | Admitting: Orthopedic Surgery

## 2024-02-05 ENCOUNTER — Ambulatory Visit (HOSPITAL_BASED_OUTPATIENT_CLINIC_OR_DEPARTMENT_OTHER)

## 2024-02-05 ENCOUNTER — Encounter (HOSPITAL_BASED_OUTPATIENT_CLINIC_OR_DEPARTMENT_OTHER): Payer: Self-pay

## 2024-02-05 ENCOUNTER — Ambulatory Visit (HOSPITAL_BASED_OUTPATIENT_CLINIC_OR_DEPARTMENT_OTHER): Admit: 2024-02-05 | Admitting: Orthopedic Surgery

## 2024-02-05 ENCOUNTER — Encounter (HOSPITAL_BASED_OUTPATIENT_CLINIC_OR_DEPARTMENT_OTHER): Admission: RE | Disposition: A | Payer: Self-pay | Attending: Orthopedic Surgery

## 2024-02-05 ENCOUNTER — Ambulatory Visit (HOSPITAL_BASED_OUTPATIENT_CLINIC_OR_DEPARTMENT_OTHER): Admitting: Anesthesiology

## 2024-02-05 ENCOUNTER — Ambulatory Visit (HOSPITAL_BASED_OUTPATIENT_CLINIC_OR_DEPARTMENT_OTHER)
Admission: RE | Admit: 2024-02-05 | Discharge: 2024-02-05 | Disposition: A | Attending: Orthopedic Surgery | Admitting: Orthopedic Surgery

## 2024-02-05 ENCOUNTER — Encounter (HOSPITAL_BASED_OUTPATIENT_CLINIC_OR_DEPARTMENT_OTHER): Payer: Self-pay | Admitting: Orthopedic Surgery

## 2024-02-05 ENCOUNTER — Other Ambulatory Visit: Payer: Self-pay

## 2024-02-05 DIAGNOSIS — E1165 Type 2 diabetes mellitus with hyperglycemia: Secondary | ICD-10-CM

## 2024-02-05 DIAGNOSIS — I10 Essential (primary) hypertension: Secondary | ICD-10-CM | POA: Diagnosis not present

## 2024-02-05 DIAGNOSIS — G5602 Carpal tunnel syndrome, left upper limb: Secondary | ICD-10-CM

## 2024-02-05 DIAGNOSIS — I2510 Atherosclerotic heart disease of native coronary artery without angina pectoris: Secondary | ICD-10-CM | POA: Diagnosis not present

## 2024-02-05 DIAGNOSIS — E1122 Type 2 diabetes mellitus with diabetic chronic kidney disease: Secondary | ICD-10-CM

## 2024-02-05 DIAGNOSIS — M1812 Unilateral primary osteoarthritis of first carpometacarpal joint, left hand: Secondary | ICD-10-CM

## 2024-02-05 DIAGNOSIS — Z87891 Personal history of nicotine dependence: Secondary | ICD-10-CM | POA: Diagnosis not present

## 2024-02-05 HISTORY — DX: Sleep apnea, unspecified: G47.30

## 2024-02-05 LAB — GLUCOSE, CAPILLARY
Glucose-Capillary: 125 mg/dL — ABNORMAL HIGH (ref 70–99)
Glucose-Capillary: 156 mg/dL — ABNORMAL HIGH (ref 70–99)

## 2024-02-05 SURGERY — ARTHROPLASTY, FINGER
Anesthesia: Regional | Site: Finger | Laterality: Left

## 2024-02-05 SURGERY — ARTHROPLASTY, FINGER
Anesthesia: Regional | Site: Hand | Laterality: Left

## 2024-02-05 MED ORDER — GLYCOPYRROLATE PF 0.2 MG/ML IJ SOSY
PREFILLED_SYRINGE | INTRAMUSCULAR | Status: AC
  Filled 2024-02-05: qty 1

## 2024-02-05 MED ORDER — CEFAZOLIN SODIUM-DEXTROSE 3-4 GM/150ML-% IV SOLN
3.0000 g | INTRAVENOUS | Status: AC
  Administered 2024-02-05: 2 g via INTRAVENOUS

## 2024-02-05 MED ORDER — PHENYLEPHRINE 80 MCG/ML (10ML) SYRINGE FOR IV PUSH (FOR BLOOD PRESSURE SUPPORT)
PREFILLED_SYRINGE | INTRAVENOUS | Status: AC
  Filled 2024-02-05: qty 10

## 2024-02-05 MED ORDER — LACTATED RINGERS IV SOLN: INTRAVENOUS | Status: DC | PRN

## 2024-02-05 MED ORDER — FENTANYL CITRATE (PF) 100 MCG/2ML IJ SOLN
INTRAMUSCULAR | Status: AC
  Filled 2024-02-05: qty 2

## 2024-02-05 MED ORDER — PHENYLEPHRINE 80 MCG/ML (10ML) SYRINGE FOR IV PUSH (FOR BLOOD PRESSURE SUPPORT)
PREFILLED_SYRINGE | INTRAVENOUS | Status: DC | PRN
  Administered 2024-02-05: 80 ug via INTRAVENOUS
  Administered 2024-02-05: 8 ug via INTRAVENOUS
  Administered 2024-02-05: 160 ug via INTRAVENOUS
  Administered 2024-02-05 (×3): 80 ug via INTRAVENOUS

## 2024-02-05 MED ORDER — SUCCINYLCHOLINE CHLORIDE 200 MG/10ML IV SOSY
PREFILLED_SYRINGE | INTRAVENOUS | Status: AC
  Filled 2024-02-05: qty 10

## 2024-02-05 MED ORDER — DEXAMETHASONE SOD PHOSPHATE PF 10 MG/ML IJ SOLN
INTRAMUSCULAR | Status: DC | PRN
  Administered 2024-02-05: 10 mg via INTRAVENOUS

## 2024-02-05 MED ORDER — DEXAMETHASONE SOD PHOSPHATE PF 10 MG/ML IJ SOLN
INTRAMUSCULAR | Status: DC | PRN
  Administered 2024-02-05: 10 mg

## 2024-02-05 MED ORDER — EPHEDRINE 5 MG/ML INJ
INTRAVENOUS | Status: AC
  Filled 2024-02-05: qty 5

## 2024-02-05 MED ORDER — EPHEDRINE SULFATE-NACL 50-0.9 MG/10ML-% IV SOSY
PREFILLED_SYRINGE | INTRAVENOUS | Status: DC | PRN
  Administered 2024-02-05 (×3): 10 mg via INTRAVENOUS

## 2024-02-05 MED ORDER — ALBUTEROL SULFATE HFA 108 (90 BASE) MCG/ACT IN AERS
INHALATION_SPRAY | RESPIRATORY_TRACT | Status: AC
  Filled 2024-02-05: qty 6.7

## 2024-02-05 MED ORDER — PROPOFOL 10 MG/ML IV BOLUS
INTRAVENOUS | Status: DC | PRN
  Administered 2024-02-05: 100 mg via INTRAVENOUS
  Administered 2024-02-05: 150 mg via INTRAVENOUS
  Administered 2024-02-05: 50 mg via INTRAVENOUS

## 2024-02-05 MED ORDER — LIDOCAINE 2% (20 MG/ML) 5 ML SYRINGE
INTRAMUSCULAR | Status: AC
  Filled 2024-02-05: qty 5

## 2024-02-05 MED ORDER — METOPROLOL TARTRATE 5 MG/5ML IV SOLN
INTRAVENOUS | Status: AC
  Filled 2024-02-05: qty 5

## 2024-02-05 MED ORDER — ROPIVACAINE HCL 5 MG/ML IJ SOLN
INTRAMUSCULAR | Status: DC | PRN
  Administered 2024-02-05: 30 mL via PERINEURAL

## 2024-02-05 MED ORDER — LIDOCAINE 2% (20 MG/ML) 5 ML SYRINGE
INTRAMUSCULAR | Status: DC | PRN
  Administered 2024-02-05: 60 mg via INTRAVENOUS
  Administered 2024-02-05: 40 mg via INTRAVENOUS

## 2024-02-05 MED ORDER — GLYCOPYRROLATE 0.2 MG/ML IJ SOLN
INTRAMUSCULAR | Status: DC | PRN
  Administered 2024-02-05: .2 mg via INTRAVENOUS

## 2024-02-05 MED ORDER — EPHEDRINE SULFATE (PRESSORS) 25 MG/5ML IV SOSY: PREFILLED_SYRINGE | INTRAVENOUS | Status: DC | PRN

## 2024-02-05 MED ORDER — SUCCINYLCHOLINE CHLORIDE 200 MG/10ML IV SOSY
PREFILLED_SYRINGE | INTRAVENOUS | Status: DC | PRN
  Administered 2024-02-05: 140 mg via INTRAVENOUS

## 2024-02-05 MED ORDER — FENTANYL CITRATE (PF) 100 MCG/2ML IJ SOLN
50.0000 ug | Freq: Once | INTRAMUSCULAR | Status: AC
  Administered 2024-02-05: 50 ug via INTRAVENOUS

## 2024-02-05 MED ORDER — ALBUTEROL SULFATE HFA 108 (90 BASE) MCG/ACT IN AERS
INHALATION_SPRAY | RESPIRATORY_TRACT | Status: DC | PRN
  Administered 2024-02-05 (×2): 4 via RESPIRATORY_TRACT

## 2024-02-05 MED ORDER — LACTATED RINGERS IV SOLN: INTRAVENOUS | Status: DC

## 2024-02-05 MED ORDER — ONDANSETRON HCL 4 MG/2ML IJ SOLN
INTRAMUSCULAR | Status: DC | PRN
  Administered 2024-02-05: 4 mg via INTRAVENOUS

## 2024-02-05 MED ORDER — DEXMEDETOMIDINE HCL IN NACL 80 MCG/20ML IV SOLN
INTRAVENOUS | Status: DC | PRN
  Administered 2024-02-05: 8 ug via INTRAVENOUS
  Administered 2024-02-05: 4 ug via INTRAVENOUS

## 2024-02-05 MED ORDER — HYDROCODONE-ACETAMINOPHEN 5-325 MG PO TABS: 1.0000 | ORAL_TABLET | Freq: Four times a day (QID) | ORAL | 0 refills | Status: DC | PRN

## 2024-02-05 MED ORDER — DEXMEDETOMIDINE HCL IN NACL 80 MCG/20ML IV SOLN
INTRAVENOUS | Status: AC
  Filled 2024-02-05: qty 20

## 2024-02-05 MED ADMIN — Sodium Chloride Irrigation Soln 0.9%: 1000 mL | NDC 99999050048

## 2024-02-05 MED FILL — Midazolam HCl Inj 2 MG/2ML (Base Equivalent): INTRAMUSCULAR | Qty: 2 | Status: CN

## 2024-02-05 MED FILL — Cefazolin Sodium-Dextrose IV Solution 2 GM/100ML-4%: INTRAVENOUS | Qty: 100 | Status: AC

## 2024-02-05 MED FILL — Lidocaine HCl Local Soln Prefilled Syringe 100 MG/5ML (2%): INTRAMUSCULAR | Qty: 5 | Status: AC

## 2024-02-05 MED FILL — Ondansetron HCl Inj 4 MG/2ML (2 MG/ML): INTRAMUSCULAR | Qty: 2 | Status: AC

## 2024-02-05 SURGICAL SUPPLY — 60 items
ANCHOR REPAIR HAND WRIST (Anchor) IMPLANT
BLADE MINI RND TIP GREEN BEAV (BLADE) ×2 IMPLANT
BLADE SURG 15 STRL LF DISP TIS (BLADE) ×4 IMPLANT
BNDG COHESIVE 4X5 TAN STRL LF (GAUZE/BANDAGES/DRESSINGS) ×2 IMPLANT
BNDG COMPR ESMARK 4X3 LF (GAUZE/BANDAGES/DRESSINGS) ×2 IMPLANT
BNDG ELASTIC 2INX 5YD STR LF (GAUZE/BANDAGES/DRESSINGS) IMPLANT
BNDG ELASTIC 4INX 5YD STR LF (GAUZE/BANDAGES/DRESSINGS) ×4 IMPLANT
BNDG GAUZE DERMACEA FLUFF 4 (GAUZE/BANDAGES/DRESSINGS) ×2 IMPLANT
CHLORAPREP W/TINT 26 (MISCELLANEOUS) ×2 IMPLANT
CLSR STERI-STRIP ANTIMIC 1/2X4 (GAUZE/BANDAGES/DRESSINGS) ×2 IMPLANT
CORD BIPOLAR FORCEPS 12FT (ELECTRODE) ×2 IMPLANT
COVER BACK TABLE 60X90IN (DRAPES) ×2 IMPLANT
CUFF TOURN SGL QUICK 18X4 (TOURNIQUET CUFF) ×2 IMPLANT
DERMABOND ADVANCED .7 DNX12 (GAUZE/BANDAGES/DRESSINGS) IMPLANT
DRAPE HAND 77X146 (DRAPES) ×2 IMPLANT
DRAPE INCISE IOBAN 66X45 STRL (DRAPES) IMPLANT
DRAPE OEC MINIVIEW 54X84 (DRAPES) ×2 IMPLANT
DRAPE SURG 17X23 STRL (DRAPES) ×2 IMPLANT
GAUZE 4X4 16PLY ~~LOC~~+RFID DBL (SPONGE) ×2 IMPLANT
GAUZE PAD ABD 8X10 STRL (GAUZE/BANDAGES/DRESSINGS) ×2 IMPLANT
GAUZE SPONGE 4X4 12PLY STRL (GAUZE/BANDAGES/DRESSINGS) ×2 IMPLANT
GAUZE STRETCH 2X75IN STRL (MISCELLANEOUS) ×2 IMPLANT
GAUZE XEROFORM 1X8 LF (GAUZE/BANDAGES/DRESSINGS) ×2 IMPLANT
GAUZE XEROFORM 5X9 LF (GAUZE/BANDAGES/DRESSINGS) IMPLANT
GLOVE BIO SURGEON STRL SZ7.5 (GLOVE) ×4 IMPLANT
GLOVE BIOGEL PI IND STRL 7.5 (GLOVE) ×4 IMPLANT
GOWN STRL REUS W/ TWL LRG LVL3 (GOWN DISPOSABLE) ×4 IMPLANT
GOWN STRL SURGICAL XL XLNG (GOWN DISPOSABLE) ×4 IMPLANT
KWIRE DBL .062X4 NSTRL (WIRE) ×2 IMPLANT
MANIFOLD NEPTUNE II (INSTRUMENTS) ×2 IMPLANT
NDL HYPO 25X1 1.5 SAFETY (NEEDLE) IMPLANT
NDL HYPO 25X5/8 SAFETYGLIDE (NEEDLE) IMPLANT
NDL SUT 6 .5 CRC .975X.05 MAYO (NEEDLE) IMPLANT
PACK BASIN DAY SURGERY FS (CUSTOM PROCEDURE TRAY) ×2 IMPLANT
PAD CAST 4YDX4 CTTN HI CHSV (CAST SUPPLIES) ×2 IMPLANT
PIN GUARD 1.57MM GREEN STERILE (PIN) ×2 IMPLANT
SHEET MEDIUM DRAPE 40X70 STRL (DRAPES) ×2 IMPLANT
SLEEVE SCD COMPRESS KNEE MED (STOCKING) ×2 IMPLANT
SLING ARM FOAM STRAP LRG (SOFTGOODS) IMPLANT
SOLN 0.9% NACL POUR BTL 1000ML (IV SOLUTION) ×2 IMPLANT
SPIKE FLUID TRANSFER (MISCELLANEOUS) IMPLANT
SPLINT FIBERGLASS 4X30 (CAST SUPPLIES) IMPLANT
SPLINT PLASTER CAST XFAST 4X15 (CAST SUPPLIES) ×2 IMPLANT
SPONGE SURGIFOAM ABS GEL 12-7 (HEMOSTASIS) ×2 IMPLANT
STOCKINETTE IMPERVIOUS 9X36 MD (GAUZE/BANDAGES/DRESSINGS) ×2 IMPLANT
SUCTION TUBE FRAZIER 10FR DISP (SUCTIONS) ×2 IMPLANT
SUT ETHILON 4 0 PS 2 18 (SUTURE) ×2 IMPLANT
SUT MNCRL AB 3-0 PS2 27 (SUTURE) ×2 IMPLANT
SUT MNCRL AB 4-0 PS2 18 (SUTURE) ×2 IMPLANT
SUT SILK 3 0 PS 1 (SUTURE) IMPLANT
SUT VIC AB 3-0 CT1 27XBRD (SUTURE) IMPLANT
SUT VIC AB 3-0 PS2 18XBRD (SUTURE) ×2 IMPLANT
SUT VIC AB 3-0 SH 27X BRD (SUTURE) IMPLANT
SUTURE 0 FIBERLP 38 BLU TPR ND (SUTURE) IMPLANT
SYR BULB EAR ULCER 3OZ GRN STR (SYRINGE) ×4 IMPLANT
SYR CONTROL 10ML LL (SYRINGE) ×2 IMPLANT
TAPE SUT LABRALTAP WHT/BLK (SUTURE) IMPLANT
TOWEL GREEN STERILE FF (TOWEL DISPOSABLE) ×4 IMPLANT
TUBE CONNECTING 20X1/4 (TUBING) ×2 IMPLANT
UNDERPAD 30X36 HEAVY ABSORB (UNDERPADS AND DIAPERS) ×2 IMPLANT

## 2024-02-05 NOTE — Anesthesia Procedure Notes (Addendum)
 Procedure Name: LMA Insertion Date/Time: 02/05/2024 10:40 AM  Performed by: Denton Niels CROME, CRNAPre-anesthesia Checklist: Patient identified, Emergency Drugs available, Suction available, Patient being monitored and Timeout performed Patient Re-evaluated:Patient Re-evaluated prior to induction Oxygen Delivery Method: Circle system utilized Preoxygenation: Pre-oxygenation with 100% oxygen Induction Type: IV induction Ventilation: Mask ventilation without difficulty LMA: LMA inserted LMA Size: 4.0 Number of attempts: 1 Placement Confirmation: positive ETCO2 Tube secured with: Tape Dental Injury: Teeth and Oropharynx as per pre-operative assessment  Comments: 1050 unable to seat LMA classic size 4; replaced with LMA gastric size 4.

## 2024-02-05 NOTE — Progress Notes (Signed)
Assisted Dr. Greg Stoltzfus with left, supraclavicular, ultrasound guided block. Side rails up, monitors on throughout procedure. See vital signs in flow sheet. Tolerated Procedure well. 

## 2024-02-05 NOTE — Anesthesia Procedure Notes (Signed)
 Procedure Name: Intubation Date/Time: 02/05/2024 11:00 AM  Performed by: Denton Niels CROME, CRNAPre-anesthesia Checklist: Patient identified, Emergency Drugs available, Suction available and Patient being monitored Patient Re-evaluated:Patient Re-evaluated prior to induction Oxygen Delivery Method: Circle system utilized Preoxygenation: Pre-oxygenation with 100% oxygen Induction Type: IV induction Ventilation: Mask ventilation without difficulty Grade View: Grade II Tube type: Oral Tube size: 7.0 mm Number of attempts: 1 Airway Equipment and Method: Stylet Placement Confirmation: ETT inserted through vocal cords under direct vision, positive ETCO2 and breath sounds checked- equal and bilateral Secured at: 23 cm Tube secured with: Tape Dental Injury: Teeth and Oropharynx as per pre-operative assessment  Comments: Removed LMA gastric 4 due to Pt inability to maintain adequate oxygenation and ventilation; Pt under inhalation anesthesia MAC 0.9  when converted airway to ETT. Tolerated well. Oxygenation and ventilation improved with ETT placement. VSS.

## 2024-02-05 NOTE — Transfer of Care (Signed)
 Immediate Anesthesia Transfer of Care Note  Patient: Sharyne LITTIE Sharps  Procedure(s) Performed: ARTHROPLASTY, FINGER (Left: Finger) CARPAL TUNNEL RELEASE (Left: Hand)  Patient Location: PACU  Anesthesia Type:GA combined with regional for post-op pain  Level of Consciousness: awake, alert , oriented, and patient cooperative  Airway & Oxygen Therapy: Patient Spontanous Breathing and Patient connected to face mask oxygen  Post-op Assessment: Report given to RN and Post -op Vital signs reviewed and stable  Post vital signs: Reviewed and stable  Last Vitals:  Vitals Value Taken Time  BP 145/64 02/05/24 13:30  Temp    Pulse 74 02/05/24 13:36  Resp 19 02/05/24 13:36  SpO2 94 % 02/05/24 13:36  Vitals shown include unfiled device data.  Last Pain:  Vitals:   02/05/24 1328  TempSrc:   PainSc: 0-No pain      Patients Stated Pain Goal: 3 (02/05/24 0958)  Complications: No notable events documented.

## 2024-02-05 NOTE — Op Note (Signed)
 NAME: Katrina Vega MEDICAL RECORD NO: [id number] DATE OF BIRTH: April 17, 1955 FACILITY: [facility] LOCATION: [facility] PHYSICIAN: GILDARDO ALDERTON, MD   OPERATIVE REPORT   DATE OF PROCEDURE: 02/05/2024    PREOPERATIVE DIAGNOSIS: Left recurrent carpal tunnel syndrome and carpometacarpal arthritis   POSTOPERATIVE DIAGNOSIS: Left recurrent carpal tunnel syndrome and carpometacarpal arthritis   PROCEDURE: Left open carpal tunnel release with hypothenar fat flap Left thumb carpometacarpal arthroplasty with tendon transfer flexor carpi radialis to abductor pollicis longus Left first extensor compartment release Left partial trapezoidectomy   SURGEON:  Gildardo Alderton, M.D.   ASSISTANT: Joesph Dinsmore, OPA   ANESTHESIA:  General with regional   INTRAVENOUS FLUIDS:  Per anesthesia flow sheet.   ESTIMATED BLOOD LOSS:  Minimal.   COMPLICATIONS: Index metacarpal fracture from anchor placement, open reduction and stabilization performed.   SPECIMENS:  none   TOURNIQUET TIME:    Total Tourniquet Time Documented: Upper Arm (Left) - 86 minutes Total: Upper Arm (Left) - 86 minutes    DISPOSITION:  Stable to PACU.   INDICATIONS: 68 year old female who was seen in the outpatient setting and found to have signs and symptoms consistent with left recurrent carpal tunnel syndrome as well as left thumb carpometacarpal arthritis.  Both conditions were refractory to conservative care.  Patient was indicated for left open carpal tunnel release with possible hypothenar fat flap, left thumb carpometacarpal arthroplasty.  Risks and benefits of surgery were discussed including the risks of infection, bleeding, scarring, stiffness, nerve injury, vascular injury, tendon injury, need for subsequent operation, persistent symptoms, recurrence.  She voiced understanding of these risks and elected to proceed.  OPERATIVE COURSE: Patient was seen and identified in the preoperative area and marked  appropriately.  Surgical consent had been signed. Preoperative IV antibiotic prophylaxis was given. She was transferred to the operating room and placed in supine position with the left upper extremity on an arm board.  General anesthesia was induced by the anesthesiologist. A regional block had been performed by anesthesia in preoperative holding.    Left upper extremity was prepped and draped in normal sterile orthopedic fashion.  A surgical pause was performed between the surgeons, anesthesia, and operating room staff and all were in agreement as to the patient, procedure, and site of procedure.  Tourniquet was placed and padded appropriately to the left upper arm.  The arm was exsanguinated and the tourniquet was inflated to 250 mmHg. We first began with the revision open carpal tunnel release.  The previous carpal tunnel incision was identified and was reopened with careful dissection through the scar tissue.  15 blade was utilized for the skin surface, incision was extended across the wrist crease in [place] style fashion and into the volar forearm.  There was noted to be significant scarring and thickened tissue over the median nerve.  We first identified the nerve proximally, release was performed in a proximal to distal fashion to prevent injury distally to the median nerve.  Antebrachial fascia was decompressed to the level of the transverse carpal ligament.  Median nerve was protected and the transverse carpal ligament was released.  There was noted to be significant scarring around the nerve circumferentially, requiring neurolysis and release of scar adhesions.  We were able to identify the distal fat pad indicating appropriate distal decompression of the median nerve.   Once we were satisfied with our proximal and distal decompression of the median nerve, decision was made to proceed with the planned hypothenar fat pad flap.  A  vascularized fat flap was elevated, ensuring adequate blood supply from  the hypothenar region of the hand.  The flap was transposed and positioned over the median nerve to provide cushioning and prevent recurrent scarring.  The flap was secured in place utilizing 3-0 Vicryl suture in figure-of-eight fashion to the radial sided tissue.  Freer elevator was inserted underneath the fat flap as it was secured to ensure that the nerve was not overly compressed.   Attention was then turned to the left thumb CMC region.  A straight line incision was made over the dorsum of the left thumb CMC joint at the glabrous/nonglabrous junction.  Crossing branches of the radial sensory nerve were identified and carefully protected.  The radial artery was identified and protected.  The first extensor compartment was first released.  The tendons of the first extensor compartment were identified within the incisional site.  The dorsal most aspect of the tendon sheath was then released and carried down over the radial styloid.  Following release of the first extensor compartment, CMC arthrotomy was completed.  Thick capsular flaps were elevated both radially and ulnarly.  The trapezium was identified and confirmed with biplanar fluoroscopy.  This was carefully freed and dissected, then removed utilizing a rongeur.  Care was taken to avoid injury to the underlying flexor carpi radialis tendon.  Following complete trapeziectomy, the scaphoid trapezoid joint was inspected.  There was noted to be cartilage loss and degenerative changes at the joint involving the articular surface of the trapezoid, consequently a partial excision of the trapezoid was performed.  At this point, the Arthrex internal brace was utilized.  Base of the index metacarpal was identified utilizing biplanar fluoroscopy, the K wire was inserted into the nonarticular portion followed by drill and the first suture anchor.  A fracture was noted of the index metacarpal with seating of the anchor, disruption of the metacarpal shaft was  appreciated.  At this juncture, the decision was made to make a dorsal incision over the index metacarpal region in order to remove the loose anchor.  This was identified and removed atraumatically.  The index metacarpal fracture was stabilized utilizing FiberWire with a lasso technique x 2.  Biplanar fluoroscopy was utilized to confirm appropriate fracture reduction and stability.    At this stage in the procedure, the FCR tendon was identified and the depth of the wound.  The APL tendons were identified at their distal extent overlying the base the metacarpal.  An 0 FiberWire was utilized to create a tendon transfer between the APL tendon slips and the FCR with traction on the thumb.  Excellent stability was noted.  The capsular flaps were repaired utilizing 3-0 Vicryl suture in figure-of-eight fashion.  The tourniquet was deflated at 86 minutes.  Fingertips were pink with brisk capillary refill after deflation of tourniquet.  Copious irrigation was performed of all incisions.  Incision was closed in layers utilizing 3-0 Monocryl and 4-0 nylon for the skin surface.  Sterile dressings were provided followed by application of a thumb spica splint with the index metacarpal included utilizing plaster.  Sling was also applied.  The operative drapes were broken down.  The patient was awoken from anesthesia safely and taken to PACU in stable condition.   Post-operative plan: The patient will recover in the post-anesthesia care unit and then be discharged home.  The patient will be non weight bearing on the left upper extremity in a short arm splint.   I will see the patient back in  the office in 2 weeks for postoperative followup.    Teondra Newburg, MD Electronically signed, 02/05/2024

## 2024-02-05 NOTE — Anesthesia Procedure Notes (Signed)
 Anesthesia Regional Block: Supraclavicular block   Pre-Anesthetic Checklist: , timeout performed,  Correct Patient, Correct Site, Correct Laterality,  Correct Procedure, Correct Position, site marked,  Risks and benefits discussed,  Surgical consent,  Pre-op evaluation,  At surgeon's request and post-op pain management  Laterality: Left  Prep: Dura Prep       Needles:  Injection technique: Single-shot  Needle Type: Echogenic Stimulator Needle     Needle Length: 5cm  Needle Gauge: 20     Additional Needles:   Procedures:,,,, ultrasound used (permanent image in chart),,    Narrative:  Start time: 02/05/2024 10:23 AM End time: 02/05/2024 10:28 AM Injection made incrementally with aspirations every 5 mL.  Performed by: Personally  Anesthesiologist: Dorethea Cordella SQUIBB, DO  Additional Notes: Patient identified. Risks/Benefits/Options discussed with patient including but not limited to bleeding, infection, nerve damage, failed block, incomplete pain control. Patient expressed understanding and wished to proceed. All questions were answered. Sterile technique was used throughout the entire procedure. Please see nursing notes for vital signs. Aspirated in 5cc intervals with injection for negative confirmation. Patient was given instructions on fall risk and not to get out of bed. All questions and concerns addressed with instructions to call with any issues or inadequate analgesia.

## 2024-02-05 NOTE — Discharge Instructions (Addendum)
° ° °  Hand Surgery Postop Instructions   Dressings: Maintain postoperative dressing until orthopedic follow-up.  Keep operative site clean and dry until orthopedic follow-up.  Wound Care: Keep your hand elevated above the level of your heart.  Do not allow it to dangle by your side. Moving your fingers is advised to stimulate circulation but will depend on the site of your surgery.  If you have a splint applied, your doctor will advise you regarding movement.  Activity: Do not drive or operate machinery until clearance given from physician. No heavy lifting with operative extremity.  Diet:  Drink liquids today or eat a light diet.  You may resume a regular diet tomorrow.    General expectations: Take prescribed medication if given, transition to over-the-counter medication as quickly as possible. Fingers may become slightly swollen.  Call your doctor if any of the following occur: Severe pain not relieved by pain medication. Elevated temperature. Dressing soaked with blood. Inability to move fingers. White or bluish color to fingers.   Per [facility] clinic policy, our goal is ensure optimal postoperative pain control with a multimodal pain management strategy. For all [facility] patients, our goal is to wean post-operative narcotic medications by 6 weeks post-operatively. If this is not possible due to utilization of pain medication prior to surgery, your [facility] doctor will support your acute post-operative pain control for the first 6 weeks postoperatively, with a plan to transition you back to your primary pain team following that. Maralee will work to ensure a therapist, occupational.  Anshul Afton Alderton, M.D. Hand Surgery [facility] Anesthesia Blocks  1. You may not be able to move or feel the blocked extremity after a regional anesthetic block. This may last may last from 3-48 hours after placement, but it will go away. The length of time depends on the  medication injected and your individual response to the medication. As the nerves start to wake up, you may experience tingling as the movement and feeling returns to your extremity. If the numbness and inability to move your extremity has not gone away after 48 hours, please call your surgeon.   2. The extremity that is blocked will need to be protected until the numbness is gone and the strength has returned. Because you cannot feel it, you will need to take extra care to avoid injury. Because it may be weak, you may have difficulty moving it or using it. You may not know what position it is in without looking at it while the block is in effect.  3. For blocks in the legs and feet, returning to weight bearing and walking needs to be done carefully. You will need to wait until the numbness is entirely gone and the strength has returned. You should be able to move your leg and foot normally before you try and bear weight or walk. You will need someone to be with you when you first try to ensure you do not fall and possibly risk injury.  4. Bruising and tenderness at the needle site are common side effects and will resolve in a few days.  5. Persistent numbness or new problems with movement should be communicated to the surgeon or the [facility] [phone] [facility] [phone]).

## 2024-02-05 NOTE — Anesthesia Postprocedure Evaluation (Signed)
 Anesthesia Post Note  Patient: Katrina Vega  Procedure(s) Performed: ARTHROPLASTY, FINGER (Left: Finger) CARPAL TUNNEL RELEASE (Left: Hand)     Patient location during evaluation: PACU Anesthesia Type: Regional and General Level of consciousness: awake and alert Pain management: pain level controlled Vital Signs Assessment: post-procedure vital signs reviewed and stable Respiratory status: spontaneous breathing, nonlabored ventilation, respiratory function stable and patient connected to nasal cannula oxygen Cardiovascular status: blood pressure returned to baseline and stable Postop Assessment: no apparent nausea or vomiting Anesthetic complications: no   No notable events documented.  Last Vitals:  Vitals:   02/05/24 1400 02/05/24 1430  BP: 130/61 136/61  Pulse: 76 77  Resp: (!) 25 16  Temp:  36.5 C  SpO2: 91% 100%    Last Pain:  Vitals:   02/05/24 1430  TempSrc:   PainSc: 0-No pain                 Cordella P Khalila Buechner

## 2024-02-05 NOTE — Anesthesia Preprocedure Evaluation (Signed)
 Anesthesia Evaluation  Patient identified by MRN, date of birth, ID band Patient awake    Reviewed: Allergy  & Precautions, NPO status , Patient's Chart, lab work & pertinent test results  Airway Mallampati: III  TM Distance: >3 FB Neck ROM: Full    Dental no notable dental hx.    Pulmonary asthma , sleep apnea , COPD, former smoker   Pulmonary exam normal        Cardiovascular hypertension, + CAD, + Past MI, + Cardiac Stents, + Peripheral Vascular Disease, +CHF and + DOE   Rhythm:Regular Rate:Normal  ECHO: IMPRESSIONS   1. Left ventricular ejection fraction, by estimation, is 60 to 65%. The left ventricle has normal function. The left ventricle has no regional wall motion abnormalities. There is mild concentric left ventricular hypertrophy. Left ventricular diastolic parameters are consistent with Grade I diastolic dysfunction (impaired relaxation).  2. Right ventricular systolic function is normal. The right ventricular size is normal. Tricuspid regurgitation signal is inadequate for assessing PA pressure.  3. Left atrial size was mildly dilated.  4. The mitral valve is normal in structure. No evidence of mitral valve regurgitation. No evidence of mitral stenosis.  5. The aortic valve has an indeterminant number of cusps. There is mild calcification of the aortic valve. Aortic valve regurgitation is not visualized. Aortic valve sclerosis/calcification is present, without any evidence of aortic stenosis.  6. The inferior vena cava is normal in size with greater than 50% respiratory variability, suggesting right atrial pressure of 3 mmHg.   Comparison(s): Prior images unable to be directly viewed, comparison made by report only. No significant change from prior study.    Neuro/Psych   Anxiety     negative neurological ROS     GI/Hepatic Neg liver ROS, hiatal hernia,GERD  ,,  Endo/Other  diabetes    Renal/GU CRFRenal disease   negative genitourinary   Musculoskeletal  (+) Arthritis ,    Abdominal  (+) + obese  Peds  Hematology  (+) Blood dyscrasia, anemia Lab Results      Component                Value               Date                      WBC                      5.9                 12/03/2023                HGB                      11.4 (L)            12/03/2023                HCT                      36.6                12/03/2023                MCV                      82.2  12/03/2023                PLT                      274                 12/03/2023              Anesthesia Other Findings   Reproductive/Obstetrics                              Anesthesia Physical Anesthesia Plan  ASA: 3  Anesthesia Plan: Regional and General   Post-op Pain Management: Regional block*   Induction: Intravenous  PONV Risk Score and Plan: 3 and Ondansetron , Dexamethasone , Propofol  infusion and Treatment may vary due to age or medical condition  Airway Management Planned: LMA and Mask  Additional Equipment: None  Intra-op Plan:   Post-operative Plan: Extubation in OR  Informed Consent: I have reviewed the patients History and Physical, chart, labs and discussed the procedure including the risks, benefits and alternatives for the proposed anesthesia with the patient or authorized representative who has indicated his/her understanding and acceptance.     Dental advisory given  Plan Discussed with: CRNA  Anesthesia Plan Comments:         Anesthesia Quick Evaluation

## 2024-02-05 NOTE — H&P (Signed)
 @LOGODEPT @  TRENESE HAFT - 68 y.o. female MRN [id number]  Date of birth: 12-30-1955   HAND SURGERY H&P UPDATE   HPI: Patient is a 68 y.o. 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  Patient denies any changes to their medical history or new systemic symptoms today.    Past Medical History:  Diagnosis Date   Anemia    Arthritis    Asthma    CAD S/P two-vessel DES PCI 2008   LAD and RI; 2013 PTCA of small RCA.   CHF (congestive heart failure), NYHA class I, chronic, diastolic (HCC) 2019   Recent Echo 12/16/2021: Mild concentric LVH.  EF 59%.  GI 1 DD.  Normal LAP-(Mildly dilated LA;; has converted from to torsemide  and now on bumetanide    CKD stage 3 due to type 2 diabetes mellitus (HCC)    COPD (chronic obstructive pulmonary disease) (HCC) 2021   Complicated by asthma and seasonal allergies.   Diabetes mellitus, type II, insulin  dependent (HCC) 2010   On insulin  60 units TID Premeal.  Also on Jardiance  and Ozempic.   Eczema    Essential hypertension    GERD (gastroesophageal reflux disease)    Heart attack (HCC) 2008   2008-two-vessel PCI; 2013 PTCA only small RCA   History of hiatal hernia    History of left hip replacement 04/2018   Hyperlipidemia associated with type 2 diabetes mellitus (HCC)    140 mg rosuvastatin    Insulin  pump in place    PAD (peripheral artery disease)    Status post bilateral SFA stents and right posterior tibial DES stent   Primary hypertension 01/20/2011      Patient's blood pressure is well controlled.  Continue current medications.   Sleep apnea    uses CPAP nightly   Past Surgical History:  Procedure Laterality Date   AAA DUPLEX  10/09/2020   Max Aorta (sac) diameter 2.51 cm prox with mild ectasia in Prox Aorta. Diffuse plaque in mid-distal Aorta. Bilateral ICA poorly visulailzed - appear to be Severely stenosed with difuse plaque. - Consider CTA of cath directed  Angio.   ABDOMINAL AORTOGRAM N/A 10/08/2023   Procedure: ABDOMINAL AORTOGRAM;  Surgeon: Court Dorn PARAS, MD;  Location: [facility] INVASIVE CV LAB;  Service: Cardiovascular;  Laterality: N/A;   APPENDECTOMY  1974   BIOPSY  02/22/2021   Procedure: BIOPSY;  Surgeon: Rollin Dover, MD;  Location: [facility];  Service: Endoscopy;;   CARDIAC CATHETERIZATION     CARPAL TUNNEL RELEASE  2017   CARPAL TUNNEL RELEASE Right 01/01/2023   Procedure: RIGHT CARPAL TUNNEL RELEASE REVISION;  Surgeon: Arlinda Buster, MD;  Location: [facility];  Service: Orthopedics;  Laterality: Right;   CHOLECYSTECTOMY  1997   pt states removed 1997-1998   COLONOSCOPY WITH PROPOFOL  N/A 02/22/2021   Procedure: COLONOSCOPY WITH PROPOFOL ;  Surgeon: Rollin Dover, MD;  Location: [facility];  Service: Endoscopy;  Laterality: N/A;   CORONARY BALLOON ANGIOPLASTY  2013   PTCA of RCA followed by PCI   CORONARY STENT INTERVENTION  2008   Text DES PCI to LAD and RI/OM1; also prox RCA   ESOPHAGOGASTRODUODENOSCOPY (EGD) WITH PROPOFOL  N/A 02/22/2021   Procedure: ESOPHAGOGASTRODUODENOSCOPY (EGD) WITH PROPOFOL ;  Surgeon: Rollin Dover, MD;  Location: [facility];  Service: Endoscopy;  Laterality: N/A;   LEA Dopplers  10/09/2020   R mid & Distal SFA -CTO - dampend flow in R Pop via collaterals - Moderate velocity increase  in R PFA. No signficant stenosis in LLE.   R ABI 0.97) - normal. L ABI - 0.91 w/ monophasic flow in L AT -> c/w 11/2019- R SFA new.   LEFT HEART CATH AND CORONARY ANGIOGRAPHY  12/22/2017   Mild LM plaque.  Mild proximal LAD plaque.  High D1-40% ostial.  Patent mid LAD DES (Taxus from 2008), large distal LAD free disease; Prox LCx-OM1 stents patent (Taxus 2008). Small RCA - patent prox stent(~50-60% ISR), PTCA site from 2013 - patent   LOWER EXTREMITY ANGIOGRAM Bilateral 12/22/2017   Bilateral SFA stents with evidence of severe right and mid left ISR bilateral popliteal arteries proximal trifurcation vessels  are patent.  Moderate to severe lesion involving mid R AntTib, poor distal flow in L Ant Tib - 2 V runoff Bilat. --> referred for R SFA Laser Atherectomy & DCB 5 x 120 mm.   LOWER EXTREMITY ANGIOGRAPHY N/A 10/08/2023   Procedure: Lower Extremity Angiography;  Surgeon: Court Dorn PARAS, MD;  Location: [facility] INVASIVE CV LAB;  Service: Cardiovascular;  Laterality: N/A;   LOWER EXTREMITY INTERVENTION Right 12/22/2017   Laser atherectomy of right SFA followed by DCB with 5.0 x 120 mm impact.-For severe right SFA ISR   LOWER EXTREMITY INTERVENTION  10/08/2023   Procedure: LOWER EXTREMITY INTERVENTION;  Surgeon: Court Dorn PARAS, MD;  Location: [facility] INVASIVE CV LAB;  Service: Cardiovascular;;   LUMBAR SPINE SURGERY  2010   NM MYOVIEW  LTD  07/07/2019   [facility]): [place].  Nondiagnostic EKG.  Dyspnea with effusion.  No ischemia or infarction.  Soft tissue attenuation noted.SABRA  LVEF 72%.  No RWMA.  LOW RISK.--No change from 11/2017   POLYPECTOMY  02/22/2021   Procedure: POLYPECTOMY;  Surgeon: Rollin Dover, MD;  Location: [facility];  Service: Endoscopy;;   REPLACEMENT TOTAL KNEE  2017 and 2018   TONSILLECTOMY     TOTAL ABDOMINAL HYSTERECTOMY  1989   TOTAL HIP ARTHROPLASTY  04/2018   TOTAL SHOULDER ARTHROPLASTY  11/2019   TRANSTHORACIC ECHOCARDIOGRAM  07/04/2019   Normal LV size, mild LVH, hyperdynamic LVEF at >65% with grade 1 diastolic dysfunction.  Otherwise no other significant abnormality.  Poor quality due to patient body habitus.   TRANSTHORACIC ECHOCARDIOGRAM  12/16/2021   [facility]) normal LV size and function.  Moderate concentric LVH.  Normal WM.  EF estimated 59%.  GR 1 DD.  Mild LA dilation.  No valvular lesions.   Social History   Socioeconomic History   Marital status: Single    Spouse name: Not on file   Number of children: 5   Years of education: Not on file   Highest education level: Associate degree: academic program   Occupational History   Not on file  Tobacco Use   Smoking status: Former    Current packs/day: 0.00    Average packs/day: 0.5 packs/day for 15.0 years (7.5 ttl pk-yrs)    Types: Cigarettes    Start date: 10/13/1980    Quit date: 10/14/1995    Years since quitting: 28.3    Passive exposure: Current   Smokeless tobacco: Never  Vaping Use   Vaping status: Never Used  Substance and Sexual Activity   Alcohol use: Not Currently   Drug use: Never   Sexual activity: Yes    Birth control/protection: Surgical    Comment: hyst  Other Topics Concern   Not on file  Social History Narrative   Not on file   Social Drivers of Health   Tobacco  Use: Medium Risk (02/02/2024)   Patient History    Smoking Tobacco Use: Former    Smokeless Tobacco Use: Never    Passive Exposure: Current  Physicist, Medical Strain: Low Risk (11/04/2023)   Overall Financial Resource Strain (CARDIA)    Difficulty of Paying Living Expenses: Not very hard  Food Insecurity: No Food Insecurity (11/04/2023)   Epic    Worried About Programme Researcher, Broadcasting/film/video in the Last Year: Never true    Ran Out of Food in the Last Year: Never true  Transportation Needs: No Transportation Needs (11/04/2023)   Epic    Lack of Transportation (Medical): No    Lack of Transportation (Non-Medical): No  Physical Activity: Insufficiently Active (11/04/2023)   Exercise Vital Sign    Days of Exercise per Week: 5 days    Minutes of Exercise per Session: 20 min  Stress: No Stress Concern Present (11/04/2023)   [employer] of Occupational Health - Occupational Stress Questionnaire    Feeling of Stress: Only a little  Social Connections: Moderately Integrated (11/04/2023)   Social Connection and Isolation Panel    Frequency of Communication with Friends and Family: Three times a week    Frequency of Social Gatherings with Friends and Family: Once a week    Attends Religious Services: More than 4 times per year    Active Member of Clubs or  Organizations: Yes    Attends Banker Meetings: More than 4 times per year    Marital Status: Never married  Depression (PHQ2-9): Low Risk (12/22/2023)   Depression (PHQ2-9)    PHQ-2 Score: 0  Alcohol Screen: Low Risk (11/04/2023)   Alcohol Screen    Last Alcohol Screening Score (AUDIT): 0  Housing: Low Risk (11/04/2023)   Epic    Unable to Pay for Housing in the Last Year: No    Number of Times Moved in the Last Year: 0    Homeless in the Last Year: No  Utilities: Not At Risk (11/04/2023)   Epic    Threatened with loss of utilities: No  Health Literacy: Adequate Health Literacy (11/04/2023)   B1300 Health Literacy    Frequency of need for help with medical instructions: Never   Family History  Problem Relation Age of Onset   Heart disease Mother    Breast cancer Mother    Heart attack Father    Lung cancer Father    Eczema Grandson    Breast cancer Maternal Cousin 80 - 68   Allergic rhinitis Neg Hx    Angioedema Neg Hx    Asthma Neg Hx    Atopy Neg Hx    Immunodeficiency Neg Hx    Urticaria Neg Hx    - negative except otherwise stated in the family history section Allergies[1] Prior to Admission medications  Medication Sig Start Date End Date Taking? Authorizing Provider  BREZTRI  AEROSPHERE 160-9-4.8 MCG/ACT AERO inhaler INHALE 2 INHALATIONS BY MOUTH  TWICE DAILY TO PREVENT COUGH OR  WHEEZE. RINSE, GARGLE, AND SPIT  AFTER USE 07/09/23  Yes Cheryl Reusing, FNP  budesonide  (PULMICORT ) 0.5 MG/2ML nebulizer solution Take 2 mLs (0.5 mg total) by nebulization in the morning and at bedtime. During respiratory infections for 1-2 weeks at a time. 03/09/23  Yes Luke Orlan HERO, DO  bumetanide  (BUMEX ) 1 MG tablet Take 1 tablet (1 mg total) by mouth daily. 09/11/23  Yes Anner Alm ORN, MD  Cholecalciferol 50 MCG (2000 UT) TABS Take 2,000 Units by mouth daily.   Yes  [provider]  Continuous Glucose Sensor (DEXCOM G7 SENSOR) MISC 1 Device by Does not apply route  continuous. 12/28/23  Yes Komal, Dartha, MD  dexlansoprazole  (DEXILANT ) 60 MG capsule TAKE 1 CAPSULE(60 MG) BY MOUTH DAILY 02/02/24  Yes Lowne Chase, Yvonne R, DO  diphenhydrAMINE (BENADRYL) 50 MG tablet Take 50 mg by mouth daily as needed for itching or allergies.   Yes [provider]  famotidine  (PEPCID ) 20 MG tablet Take 1 tablet (20 mg total) by mouth 2 (two) times daily. 11/26/23  Yes Antonio Cyndee Rockers R, DO  gabapentin  (NEURONTIN ) 300 MG capsule TAKE 1 CAPSULE BY MOUTH 3 TIMES  DAILY 01/23/24  Yes Antonio Cyndee Rockers R, DO  HUMALOG  100 UNIT/ML injection INJECT SUBCUTANEOUSLY VIA  INSULIN  PUMP AS DIRECTED .  MAXIMUM DAILY DOSE: 150 UNITS 01/25/24  Yes Komal, Motwani, MD  Insulin  Disposable Pump (OMNIPOD 5 DEXG7G6 PODS GEN 5) MISC 1 Device by Does not apply route every other day. Change pod every 1.5 days 10/28/23  Yes Komal, Motwani, MD  ipratropium (ATROVENT ) 0.03 % nasal spray Place 1-2 sprays into both nostrils 2 (two) times daily as needed (nasal drainage). 01/25/24  Yes Luke Orlan HERO, DO  isosorbide  mononitrate (IMDUR ) 60 MG 24 hr tablet TAKE 1 TABLET BY MOUTH DAILY 07/14/23  Yes Court Dorn PARAS, MD  levalbuterol  (XOPENEX  HFA) 45 MCG/ACT inhaler USE 2 INHALATIONS BY MOUTH EVERY 4 TO 6 HOURS AS NEEDED FOR  SHORTNESS OF BREATH , COUGH,  WHEEZE AND TIGHTNESS IN CHEST 07/22/22  Yes Cheryl Reusing, FNP  levalbuterol  (XOPENEX ) 0.63 MG/3ML nebulizer solution Take 3 mLs (0.63 mg total) by nebulization every 4 (four) hours as needed for wheezing or shortness of breath (coughing fits). 11/24/22  Yes Luke Orlan HERO, DO  methocarbamol  (ROBAXIN ) 500 MG tablet Take 1 tablet (500 mg total) by mouth every 6 (six) hours as needed for muscle spasms. 07/31/23  Yes Jerri Kay HERO, MD  metoprolol  tartrate (LOPRESSOR ) 50 MG tablet TAKE 1 TABLET BY MOUTH EVERY 12  HOURS 09/11/23  Yes Anner Alm ORN, MD  montelukast  (SINGULAIR ) 10 MG tablet Take 1 tablet (10 mg total) by mouth at bedtime. 09/30/23  Yes Luke Orlan HERO, DO   rosuvastatin  (CRESTOR ) 40 MG tablet TAKE 1 TABLET BY MOUTH AT  BEDTIME 09/11/23  Yes Anner Alm ORN, MD  clopidogrel  (PLAVIX ) 75 MG tablet TAKE 1 TABLET BY MOUTH DAILY 07/14/23   Court Dorn PARAS, MD  Continuous Blood Gluc Transmit (DEXCOM G6 TRANSMITTER) MISC  10/17/21   [provider]  Continuous Glucose Sensor (DEXCOM G6 SENSOR) MISC 1 Device by Does not apply route continuous. 06/22/23   Obadiah Dartha, MD  diazepam  (VALIUM ) 5 MG tablet Take 1 tablet (5 mg total) by mouth every 12 (twelve) hours as needed for anxiety. 08/04/23   Lowne Chase, Yvonne R, DO  EPINEPHrine  (EPIPEN  2-PAK) 0.3 mg/0.3 mL IJ SOAJ injection Use as directed for severe allergic reactions 03/06/22   Cheryl Reusing, FNP  FASENRA  PEN 30 MG/ML prefilled autoinjector INJECT 30MG  SUBCUTANEOUSLY EVERY 8 WEEKS 02/16/23   Cheryl Reusing, FNP  Glucagon  (BAQSIMI  ONE PACK) 3 MG/DOSE POWD Place 1 Device into the nose as needed (Low blood sugar with impaired consciousness). 06/22/23   Obadiah Dartha, MD  glucose 4 GM chewable tablet Chew 1 tablet by mouth once as needed for low blood sugar. 07/30/19   [provider]  HUMALOG  KWIKPEN 100 UNIT/ML KwikPen INJECT SUBCUTANEOUSLY AS  DIRECTED BASED ON CARB AND  CORRECTION RATIO, MAX  DOSE 80  UNITS DAILY 10/27/23   Obadiah Birmingham, MD  Insulin  Pen Needle (EMBECTA PEN NEEDLE NANO 2 GEN) 32G X 4 MM MISC USE 1 NEEDLE TO INJECT  SUBCUTANEOUSLY IN THE MORNING AT NOON IN THE EVENING AND AT  BEDTIME 12/30/23   Obadiah Birmingham, MD  LANTUS  SOLOSTAR 100 UNIT/ML Solostar Pen INJECT SUBCUTANEOUSLY 55 UNITS  DAILY Patient not taking: Reported on 02/02/2024 11/12/23   Komal, Motwani, MD  meclizine  (ANTIVERT ) 25 MG tablet Take 1 tablet (25 mg total) by mouth 3 (three) times daily as needed for dizziness. 08/17/23   Antonio Cyndee Jamee JONELLE, DO  nitroGLYCERIN  (NITROSTAT ) 0.4 MG SL tablet Place 1 tablet (0.4 mg total) under the tongue every 5 (five) minutes as needed for chest pain. 05/25/23   Anner Alm ORN, MD  Respiratory Therapy Supplies [facility] 2 CPAP HOSE HANGER) MISC by Does not apply route.    [provider]  tirzepatide  (MOUNJARO ) 12.5 MG/0.5ML Pen Inject 12.5 mg into the skin once a week. 12/28/23   Obadiah Birmingham, MD   No results found. - Positive ROS: All other systems have been reviewed and were otherwise negative with the exception of those mentioned in the HPI and as above.  Physical Exam: General: No acute distress, resting comfortably Cardiovascular: BUE warm and well perfused, normal rate Respiratory: Normal WOB on RA Skin: Warm and dry Neurologic: Sensation intact distally Psychiatric: Patient is at baseline mood and affect  Left upper Extremity  Left hand: - Tenderness associated at the thumb CMC interval, positive grind for pain and crepitus, minimal MP hyperextension - Sensation is slightly diminished in the median nerve distribution, AIN/PIN/interosseous intact - Positive Tinel's over the carpal tunnel region, positive Phalen's - Well-healed previous incision at the volar aspect of the hand from previous carpal tunnel release   Assessment/Plan: OR today for left thumb CMC arthroplasty with associated open carpal tunnel release. We again reviewed the risks of surgery which include bleeding, infection, damage to neurovascular structures, persistent symptoms, need for additional surgery.  Informed consent was signed.  All questions were answered.   [facility], Hand Surgery      [1]  Allergies Allergen Reactions   Contrast Media [Iodinated Contrast Media] Shortness Of Breath and Other (See Comments)    sob, chest tight 2024, small amount in joint injection tolerated well   Morphine Itching   Ioversol    Oxycodone  Itching   Tramadol  Itching and Nausea Only

## 2024-02-08 ENCOUNTER — Other Ambulatory Visit: Payer: Self-pay | Admitting: Orthopedic Surgery

## 2024-02-08 ENCOUNTER — Encounter (HOSPITAL_BASED_OUTPATIENT_CLINIC_OR_DEPARTMENT_OTHER): Payer: Self-pay | Admitting: Orthopedic Surgery

## 2024-02-08 MED ORDER — OXYCODONE HCL 5 MG PO TABS: 5.0000 mg | ORAL_TABLET | Freq: Four times a day (QID) | ORAL | 0 refills | Status: DC | PRN
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
## 2024-02-16 ENCOUNTER — Ambulatory Visit: Admitting: "Endocrinology

## 2024-02-17 ENCOUNTER — Other Ambulatory Visit (INDEPENDENT_AMBULATORY_CARE_PROVIDER_SITE_OTHER): Payer: Self-pay

## 2024-02-17 ENCOUNTER — Telehealth: Payer: Self-pay | Admitting: Orthopedic Surgery

## 2024-02-17 ENCOUNTER — Ambulatory Visit: Admitting: Orthopedic Surgery

## 2024-02-17 ENCOUNTER — Encounter: Admitting: Orthopedic Surgery

## 2024-02-17 DIAGNOSIS — M1812 Unilateral primary osteoarthritis of first carpometacarpal joint, left hand: Secondary | ICD-10-CM

## 2024-02-17 NOTE — Telephone Encounter (Signed)
 Pt was seen today and need a 2 wk post op. Pt number is [phone].

## 2024-02-17 NOTE — Progress Notes (Unsigned)
" ° °  Katrina Vega - 68 y.o. female MRN [id number]  Date of birth: 10/26/1955  Office Visit Note: Visit Date: 02/17/2024 PCP: Antonio Cyndee Jamee JONELLE, DO Referred by: Antonio Cyndee Jamee JONELLE, DO  Subjective:  HPI: Katrina Vega is a 68 y.o. female who presents today for follow up 2 weeks status post left open carpal tunnel release with hypothenar fat flap. Left thumb carpometacarpal arthroplasty with tendon transfer flexor carpi radialis to abductor pollicis longus. Left first extensor compartment release. Left partial trapezoidectomy.  Pertinent ROS were reviewed with the patient and found to be negative unless otherwise specified above in HPI.   Assessment & Plan: Visit Diagnoses: No diagnosis found.  Plan: ***  Follow-up: No follow-ups on file.   Meds & Orders: No orders of the defined types were placed in this encounter.  No orders of the defined types were placed in this encounter.    Procedures: No procedures performed       Objective:   Vital Signs: There were no vitals taken for this visit.  Ortho Exam ***  Imaging: No results found.   Drucilla Cumber Afton Alderton, M.D. [facility], Hand Surgery  "

## 2024-02-22 ENCOUNTER — Other Ambulatory Visit: Payer: Self-pay | Admitting: Orthopedic Surgery

## 2024-02-22 ENCOUNTER — Encounter: Admitting: Orthopedic Surgery

## 2024-02-22 ENCOUNTER — Other Ambulatory Visit (HOSPITAL_COMMUNITY): Payer: Self-pay

## 2024-02-22 ENCOUNTER — Other Ambulatory Visit: Payer: Self-pay | Admitting: Family

## 2024-02-22 ENCOUNTER — Encounter: Admitting: Rehabilitative and Restorative Service Providers"

## 2024-02-22 ENCOUNTER — Encounter: Payer: Self-pay | Admitting: Allergy

## 2024-02-22 ENCOUNTER — Ambulatory Visit

## 2024-02-22 ENCOUNTER — Encounter: Payer: Self-pay | Admitting: Family

## 2024-02-22 MED ORDER — HYDROCODONE-ACETAMINOPHEN 5-325 MG PO TABS: 1.0000 | ORAL_TABLET | Freq: Four times a day (QID) | ORAL | 0 refills | Status: DC | PRN

## 2024-02-23 ENCOUNTER — Other Ambulatory Visit: Payer: Self-pay | Admitting: *Deleted

## 2024-02-23 ENCOUNTER — Telehealth: Payer: Self-pay

## 2024-02-23 ENCOUNTER — Other Ambulatory Visit (HOSPITAL_COMMUNITY): Payer: Self-pay

## 2024-02-23 MED ORDER — FASENRA PEN 30 MG/ML ~~LOC~~ SOAJ: 30.0000 mg | SUBCUTANEOUS | 6 refills | Status: AC

## 2024-02-23 NOTE — Telephone Encounter (Signed)
 [employer] needs clarification, should patient be taking humalog  vial and kwick?

## 2024-02-24 ENCOUNTER — Encounter: Admitting: Orthopedic Surgery

## 2024-02-27 ENCOUNTER — Other Ambulatory Visit: Payer: Self-pay | Admitting: Family Medicine

## 2024-02-27 DIAGNOSIS — K449 Diaphragmatic hernia without obstruction or gangrene: Secondary | ICD-10-CM

## 2024-02-29 NOTE — Progress Notes (Signed)
" ° °  LAVIDA PATCH - 69 y.o. female MRN [id number]  Date of birth: August 17, 1955  Office Visit Note: Visit Date: 03/01/2024 PCP: Antonio Cyndee Jamee JONELLE, DO Referred by: Antonio Cyndee Jamee JONELLE, DO  Subjective:  HPI: Katrina Vega is a 69 y.o. female who presents today for follow up 4 weeks status post left open carpal tunnel release with hypothenar fat flap and left thumb CMC arthroplasty. Patient did sustain intraoperative fracture to the index metacarpal during [facility] arthroplasty at the time of anchor placement, FiberWire cerclage fixation was performed at that time.  He has been compliant with casting as instructed, does have some ongoing swelling.  Pain is controlled at rest.  Pertinent ROS were reviewed with the patient and found to be negative unless otherwise specified above in HPI.   Assessment & Plan: Visit Diagnoses:  1. Arthritis of carpometacarpal (CMC) joint of left thumb     Plan: Repeat x-rays were taken today which show stable appearance of the index metacarpal with appropriate oval healing.  Thumb spica cast will be reapplied today for additional 2 weeks to allow for further bony consolidation of the fracture prior to mobilization.  Will plan on seeing her back in 2 weeks for repeat clinical and radiographic check, will arrange for occupational therapy at that visit for fabrication of a thumb spica orthosis and begin postoperative protocol.  Follow-up: No follow-ups on file.   Meds & Orders: No orders of the defined types were placed in this encounter.   Orders Placed This Encounter  Procedures   XR Hand Complete Left     Procedures: No procedures performed       Objective:   Vital Signs: There were no vitals taken for this visit.  Ortho Exam Left wrist/hand: - Well-healing incision at the glabrous/nonglabrous juncture over the [facility] region of the thumb, well-healing palmar incision, well-healing incision over the dorsal index metacarpal - Diffuse swelling is seen  throughout the hand, sensation intact distally, hand remains warm well-perfused  Imaging: XR Hand Complete Left Result Date: 03/01/2024 X-rays of the left hand demonstrate stable appearance of the thumb metacarpal status post trapeziectomy.  Index metacarpal fracture once again visualized with ongoing bony consolidation, no interval displacement.    Kayler Buckholtz Afton Alderton, M.D. [facility], Hand Surgery  "

## 2024-03-01 ENCOUNTER — Other Ambulatory Visit (INDEPENDENT_AMBULATORY_CARE_PROVIDER_SITE_OTHER)

## 2024-03-01 ENCOUNTER — Other Ambulatory Visit: Payer: Self-pay | Admitting: Orthopedic Surgery

## 2024-03-01 ENCOUNTER — Ambulatory Visit (INDEPENDENT_AMBULATORY_CARE_PROVIDER_SITE_OTHER): Admitting: Orthopedic Surgery

## 2024-03-01 DIAGNOSIS — M1812 Unilateral primary osteoarthritis of first carpometacarpal joint, left hand: Secondary | ICD-10-CM | POA: Diagnosis not present

## 2024-03-01 MED ORDER — HYDROCODONE-ACETAMINOPHEN 5-325 MG PO TABS: 1.0000 | ORAL_TABLET | Freq: Four times a day (QID) | ORAL | 0 refills | Status: AC | PRN

## 2024-03-02 ENCOUNTER — Ambulatory Visit

## 2024-03-03 ENCOUNTER — Encounter: Payer: Self-pay | Admitting: Gastroenterology

## 2024-03-03 ENCOUNTER — Telehealth: Payer: Self-pay

## 2024-03-03 ENCOUNTER — Ambulatory Visit: Admitting: Gastroenterology

## 2024-03-03 VITALS — BP 110/70 | HR 91 | Ht 65.0 in | Wt 265.0 lb

## 2024-03-03 DIAGNOSIS — R140 Abdominal distension (gaseous): Secondary | ICD-10-CM | POA: Diagnosis not present

## 2024-03-03 DIAGNOSIS — Z860100 Personal history of colon polyps, unspecified: Secondary | ICD-10-CM | POA: Diagnosis not present

## 2024-03-03 DIAGNOSIS — Z8719 Personal history of other diseases of the digestive system: Secondary | ICD-10-CM | POA: Diagnosis not present

## 2024-03-03 DIAGNOSIS — T402X5A Adverse effect of other opioids, initial encounter: Secondary | ICD-10-CM

## 2024-03-03 DIAGNOSIS — K5903 Drug induced constipation: Secondary | ICD-10-CM

## 2024-03-03 DIAGNOSIS — K219 Gastro-esophageal reflux disease without esophagitis: Secondary | ICD-10-CM | POA: Diagnosis not present

## 2024-03-03 MED ORDER — NA SULFATE-K SULFATE-MG SULF 17.5-3.13-1.6 GM/177ML PO SOLN: 1.0000 | Freq: Once | ORAL | 0 refills | Status: AC

## 2024-03-03 NOTE — Progress Notes (Signed)
 "  Chief Complaint:GERD with HH, esophageal stricture Primary GI Doctor:Dr. San   HPI:  Patient is a  69  year old female patient with past medical history of GERD, asthma, COPD, CAD, DM, and CKD stage 3, who was referred to me by Antonio Cyndee Jamee JONELLE, DO on 11/26/23 for a evaluation of GERD with HH, esophageal stricture .    10/2023 cardiology follow-up, reviewed entire note.   Interval History Patient presents for evaluation of GERD and esophageal dysphagia.  Patient has history of GERD and taking Dexilant  60 mg po daily and Pepcid  20 mg po daily. She reports she has been taking these medications last few months. She reports this has helped some with the reflux. She reports when she eats sometimes the food goes down slowly and tickles back of her throat causing her to cough.  No history of stroke.  Patient also has issues with constipation from taking hydrocodone  twice daily last month for recent arm surgery. She reports one BM per week. She has a lot of bloating. She strains a lot. No blood in stool. She is currently taking OTC metamucil. She has used pro secretory agent in the past and has worked well.  No alcohol use. Nonsmoker.   Patient taking Plavix  75 mg po daily.  Patient has copd, not on oxygen.   Surgical history: gallbladder, appendectomy   Patient's family history includes: father with lung CA, no esophageal CA, no colon CA  Wt Readings from Last 3 Encounters:  03/03/24 265 lb (120.2 kg)  02/05/24 263 lb 10.7 oz (119.6 kg)  01/25/24 266 lb 8 oz (120.9 kg)    Past Medical History:  Diagnosis Date   Anemia    Arthritis    Asthma    CAD S/P two-vessel DES PCI 2008   LAD and RI; 2013 PTCA of small RCA.   CHF (congestive heart failure), NYHA class I, chronic, diastolic (HCC) 2019   Recent Echo 12/16/2021: Mild concentric LVH.  EF 59%.  GI 1 DD.  Normal LAP-(Mildly dilated LA;; has converted from to torsemide  and now on bumetanide    CKD stage 3 due to type 2  diabetes mellitus (HCC)    COPD (chronic obstructive pulmonary disease) (HCC) 2021   Complicated by asthma and seasonal allergies.   Diabetes mellitus, type II, insulin  dependent (HCC) 2010   On insulin  60 units TID Premeal.  Also on Jardiance  and Ozempic.   Eczema    Essential hypertension    GERD (gastroesophageal reflux disease)    Heart attack (HCC) 2008   2008-two-vessel PCI; 2013 PTCA only small RCA   History of hiatal hernia    History of left hip replacement 04/2018   Hyperlipidemia associated with type 2 diabetes mellitus (HCC)    140 mg rosuvastatin    Insulin  pump in place    PAD (peripheral artery disease)    Status post bilateral SFA stents and right posterior tibial DES stent   Primary hypertension 01/20/2011      Patient's blood pressure is well controlled.  Continue current medications.   Sleep apnea    uses CPAP nightly    Past Surgical History:  Procedure Laterality Date   AAA DUPLEX  10/09/2020   Max Aorta (sac) diameter 2.51 cm prox with mild ectasia in Prox Aorta. Diffuse plaque in mid-distal Aorta. Bilateral ICA poorly visulailzed - appear to be Severely stenosed with difuse plaque. - Consider CTA of cath directed Angio.   ABDOMINAL AORTOGRAM N/A 10/08/2023   Procedure: ABDOMINAL  AORTOGRAM;  Surgeon: Court Dorn PARAS, MD;  Location: [facility] INVASIVE CV LAB;  Service: Cardiovascular;  Laterality: N/A;   APPENDECTOMY  1974   BIOPSY  02/22/2021   Procedure: BIOPSY;  Surgeon: Rollin Dover, MD;  Location: [facility];  Service: Endoscopy;;   CARDIAC CATHETERIZATION     CARPAL TUNNEL RELEASE  2017   CARPAL TUNNEL RELEASE Right 01/01/2023   Procedure: RIGHT CARPAL TUNNEL RELEASE REVISION;  Surgeon: Arlinda Buster, MD;  Location: [facility];  Service: Orthopedics;  Laterality: Right;   CARPAL TUNNEL RELEASE Left 02/05/2024   Procedure: CARPAL TUNNEL RELEASE;  Surgeon: Arlinda Buster, MD;  Location: [facility];  Service: Orthopedics;   Laterality: Left;   CHOLECYSTECTOMY  1997   pt states removed 1997-1998   COLONOSCOPY WITH PROPOFOL  N/A 02/22/2021   Procedure: COLONOSCOPY WITH PROPOFOL ;  Surgeon: Rollin Dover, MD;  Location: [facility];  Service: Endoscopy;  Laterality: N/A;   CORONARY BALLOON ANGIOPLASTY  2013   PTCA of RCA followed by PCI   CORONARY STENT INTERVENTION  2008   Text DES PCI to LAD and RI/OM1; also prox RCA   ESOPHAGOGASTRODUODENOSCOPY (EGD) WITH PROPOFOL  N/A 02/22/2021   Procedure: ESOPHAGOGASTRODUODENOSCOPY (EGD) WITH PROPOFOL ;  Surgeon: Rollin Dover, MD;  Location: [facility];  Service: Endoscopy;  Laterality: N/A;   FINGER ARTHROPLASTY Left 02/05/2024   Procedure: ARTHROPLASTY, FINGER;  Surgeon: Arlinda Buster, MD;  Location: [facility];  Service: Orthopedics;  Laterality: Left;  LEFT THUMB CARPOMETACARPAL ARTHROPLASTY WITH INTERNAL BRACE / LEFT OPEN CARPAL TUNNEL RELEASE   LEA Dopplers  10/09/2020   R mid & Distal SFA -CTO - dampend flow in R Pop via collaterals - Moderate velocity increase in R PFA. No signficant stenosis in LLE.   R ABI 0.97) - normal. L ABI - 0.91 w/ monophasic flow in L AT -> c/w 11/2019- R SFA new.   LEFT HEART CATH AND CORONARY ANGIOGRAPHY  12/22/2017   Mild LM plaque.  Mild proximal LAD plaque.  High D1-40% ostial.  Patent mid LAD DES (Taxus from 2008), large distal LAD free disease; Prox LCx-OM1 stents patent (Taxus 2008). Small RCA - patent prox stent(~50-60% ISR), PTCA site from 2013 - patent   LOWER EXTREMITY ANGIOGRAM Bilateral 12/22/2017   Bilateral SFA stents with evidence of severe right and mid left ISR bilateral popliteal arteries proximal trifurcation vessels are patent.  Moderate to severe lesion involving mid R AntTib, poor distal flow in L Ant Tib - 2 V runoff Bilat. --> referred for R SFA Laser Atherectomy & DCB 5 x 120 mm.   LOWER EXTREMITY ANGIOGRAPHY N/A 10/08/2023   Procedure: Lower Extremity Angiography;  Surgeon: Court Dorn PARAS, MD;   Location[facility] INVASIVE CV LAB;  Service: Cardiovascular;  Laterality: N/A;   LOWER EXTREMITY INTERVENTION Right 12/22/2017   Laser atherectomy of right SFA followed by DCB with 5.0 x 120 mm impact.-For severe right SFA ISR   LOWER EXTREMITY INTERVENTION  10/08/2023   Procedure: LOWER EXTREMITY INTERVENTION;  Surgeon: Court Dorn PARAS, MD;  Location: [facility] INVASIVE CV LAB;  Service: Cardiovascular;;   LUMBAR SPINE SURGERY  2010   NM MYOVIEW  LTD  07/07/2019   [facility]): [place].  Nondiagnostic EKG.  Dyspnea with effusion.  No ischemia or infarction.  Soft tissue attenuation noted.SABRA  LVEF 72%.  No RWMA.  LOW RISK.--No change from 11/2017   POLYPECTOMY  02/22/2021   Procedure: POLYPECTOMY;  Surgeon: Rollin Dover, MD;  Location: [facility];  Service: Endoscopy;;  REPLACEMENT TOTAL KNEE  2017 and 2018   TONSILLECTOMY     TOTAL ABDOMINAL HYSTERECTOMY  1989   TOTAL HIP ARTHROPLASTY  04/2018   TOTAL SHOULDER ARTHROPLASTY  11/2019   TRANSTHORACIC ECHOCARDIOGRAM  07/04/2019   Normal LV size, mild LVH, hyperdynamic LVEF at >65% with grade 1 diastolic dysfunction.  Otherwise no other significant abnormality.  Poor quality due to patient body habitus.   TRANSTHORACIC ECHOCARDIOGRAM  12/16/2021   [facility]) normal LV size and function.  Moderate concentric LVH.  Normal WM.  EF estimated 59%.  GR 1 DD.  Mild LA dilation.  No valvular lesions.    Current Outpatient Medications  Medication Sig Dispense Refill   benralizumab  (FASENRA  PEN) 30 MG/ML prefilled autoinjector Inject 1 mL (30 mg total) into the skin every 8 (eight) weeks. 1 mL 6   BREZTRI  AEROSPHERE 160-9-4.8 MCG/ACT AERO inhaler INHALE 2 INHALATIONS BY MOUTH  TWICE DAILY TO PREVENT COUGH OR  WHEEZE. RINSE, GARGLE, AND SPIT  AFTER USE 32.1 g 3   budesonide  (PULMICORT ) 0.5 MG/2ML nebulizer solution Take 2 mLs (0.5 mg total) by nebulization in the morning and at bedtime. During respiratory  infections for 1-2 weeks at a time. 120 mL 2   bumetanide  (BUMEX ) 1 MG tablet Take 1 tablet (1 mg total) by mouth daily. 100 tablet 3   Cholecalciferol 50 MCG (2000 UT) TABS Take 2,000 Units by mouth daily.     clopidogrel  (PLAVIX ) 75 MG tablet TAKE 1 TABLET BY MOUTH DAILY 90 tablet 3   Continuous Blood Gluc Transmit (DEXCOM G6 TRANSMITTER) MISC      Continuous Glucose Sensor (DEXCOM G6 SENSOR) MISC 1 Device by Does not apply route continuous. 9 each 3   Continuous Glucose Sensor (DEXCOM G7 SENSOR) MISC 1 Device by Does not apply route continuous. 9 each 3   dexlansoprazole  (DEXILANT ) 60 MG capsule TAKE 1 CAPSULE(60 MG) BY MOUTH DAILY 90 capsule 0   diazepam  (VALIUM ) 5 MG tablet Take 1 tablet (5 mg total) by mouth every 12 (twelve) hours as needed for anxiety. 5 tablet 0   diphenhydrAMINE (BENADRYL) 50 MG tablet Take 50 mg by mouth daily as needed for itching or allergies.     EPINEPHrine  (EPIPEN  2-PAK) 0.3 mg/0.3 mL IJ SOAJ injection Use as directed for severe allergic reactions 2 each 3   famotidine  (PEPCID ) 20 MG tablet Take 1 tablet (20 mg total) by mouth 2 (two) times daily. 60 tablet 3   gabapentin  (NEURONTIN ) 300 MG capsule TAKE 1 CAPSULE BY MOUTH 3 TIMES  DAILY 300 capsule 1   Glucagon  (BAQSIMI  ONE PACK) 3 MG/DOSE POWD Place 1 Device into the nose as needed (Low blood sugar with impaired consciousness). 2 each 3   glucose 4 GM chewable tablet Chew 1 tablet by mouth once as needed for low blood sugar.     HUMALOG  100 UNIT/ML injection INJECT SUBCUTANEOUSLY VIA  INSULIN  PUMP AS DIRECTED .  MAXIMUM DAILY DOSE: 150 UNITS 90 mL 5   HUMALOG  KWIKPEN 100 UNIT/ML KwikPen INJECT SUBCUTANEOUSLY AS  DIRECTED BASED ON CARB AND  CORRECTION RATIO, MAX DOSE 80  UNITS DAILY 45 mL 5   HYDROcodone -acetaminophen  (NORCO/VICODIN) 5-325 MG tablet Take 1 tablet by mouth every 6 (six) hours as needed for moderate pain (pain score 4-6). 30 tablet 0   Insulin  Disposable Pump (OMNIPOD 5 DEXG7G6 PODS GEN 5) MISC 1  Device by Does not apply route every other day. Change pod every 1.5 days 60 each 3  Insulin  Pen Needle (EMBECTA PEN NEEDLE NANO 2 GEN) 32G X 4 MM MISC USE 1 NEEDLE TO INJECT  SUBCUTANEOUSLY IN THE MORNING AT NOON IN THE EVENING AND AT  BEDTIME 300 each 2   ipratropium (ATROVENT ) 0.03 % nasal spray Place 1-2 sprays into both nostrils 2 (two) times daily as needed (nasal drainage). 30 mL 3   isosorbide  mononitrate (IMDUR ) 60 MG 24 hr tablet TAKE 1 TABLET BY MOUTH DAILY 90 tablet 3   LANTUS  SOLOSTAR 100 UNIT/ML Solostar Pen INJECT SUBCUTANEOUSLY 55 UNITS  DAILY 45 mL 3   levalbuterol  (XOPENEX  HFA) 45 MCG/ACT inhaler USE 2 INHALATIONS BY MOUTH EVERY 4 TO 6 HOURS AS NEEDED FOR  SHORTNESS OF BREATH , COUGH,  WHEEZE AND TIGHTNESS IN CHEST 45 g 6   levalbuterol  (XOPENEX ) 0.63 MG/3ML nebulizer solution Take 3 mLs (0.63 mg total) by nebulization every 4 (four) hours as needed for wheezing or shortness of breath (coughing fits). 75 mL 1   meclizine  (ANTIVERT ) 25 MG tablet Take 1 tablet (25 mg total) by mouth 3 (three) times daily as needed for dizziness. 30 tablet 0   methocarbamol  (ROBAXIN ) 500 MG tablet Take 1 tablet (500 mg total) by mouth every 6 (six) hours as needed for muscle spasms. 30 tablet 2   metoprolol  tartrate (LOPRESSOR ) 50 MG tablet TAKE 1 TABLET BY MOUTH EVERY 12  HOURS 180 tablet 3   montelukast  (SINGULAIR ) 10 MG tablet Take 1 tablet (10 mg total) by mouth at bedtime. 90 tablet 3   Na Sulfate-K Sulfate-Mg Sulfate concentrate (SUPREP) 17.5-3.13-1.6 GM/177ML SOLN Take 1 kit (354 mLs total) by mouth once for 1 dose. 354 mL 0   nitroGLYCERIN  (NITROSTAT ) 0.4 MG SL tablet Place 1 tablet (0.4 mg total) under the tongue every 5 (five) minutes as needed for chest pain. 25 tablet 3   Respiratory Therapy Supplies (CARETOUCH 2 CPAP HOSE HANGER) MISC by Does not apply route.     rosuvastatin  (CRESTOR ) 40 MG tablet TAKE 1 TABLET BY MOUTH AT  BEDTIME 90 tablet 3   tirzepatide  (MOUNJARO ) 12.5 MG/0.5ML Pen  Inject 12.5 mg into the skin once a week. 2 mL 0   Current Facility-Administered Medications  Medication Dose Route Frequency Provider Last Rate Last Admin   Benralizumab  SOSY 30 mg  30 mg Subcutaneous Q8 Weeks Dale, Christine, FNP   30 mg at 12/28/23 1432    Allergies as of 03/03/2024 - Review Complete 03/03/2024  Allergen Reaction Noted   Contrast media [iodinated contrast media] Shortness Of Breath and Other (See Comments) 06/07/2014   Morphine Itching 06/07/2014   Ioversol  12/09/2021   Oxycodone  Itching 04/20/2018   Tramadol  Itching and Nausea Only 09/12/2022    Family History  Problem Relation Age of Onset   Heart disease Mother    Breast cancer Mother    Heart attack Father    Lung cancer Father    Eczema Grandson    Breast cancer Maternal Cousin 23 - 49   Allergic rhinitis Neg Hx    Angioedema Neg Hx    Asthma Neg Hx    Atopy Neg Hx    Immunodeficiency Neg Hx    Urticaria Neg Hx     Review of Systems:    Constitutional: No weight loss, fever, chills, weakness or fatigue HEENT: Eyes: No change in vision               Ears, Nose, Throat:  No change in hearing or congestion Skin: No rash or itching Cardiovascular:  No chest pain, chest pressure or palpitations   Respiratory: No SOB or cough Gastrointestinal: See HPI and otherwise negative Genitourinary: No dysuria or change in urinary frequency Neurological: No headache, dizziness or syncope Musculoskeletal: No new muscle or joint pain Hematologic: No bleeding or bruising Psychiatric: No history of depression or anxiety    Physical Exam:  Vital signs: BP 110/70   Pulse 91   Ht 5' 5 (1.651 m)   Wt 265 lb (120.2 kg)   BMI 44.10 kg/m   Constitutional:   Pleasant  female appears to be in NAD, Well developed, Well nourished, alert and cooperative Eyes:   PEERL, EOMI. No icterus. Conjunctiva pink. Neck:  Supple Throat: Oral cavity and pharynx without inflammation, swelling or lesion.  Respiratory:  Respirations even and unlabored. Lungs clear to auscultation bilaterally.   No wheezes, crackles, or rhonchi.  Cardiovascular: Normal S1, S2. Regular rate and rhythm. No peripheral edema, cyanosis or pallor.  Gastrointestinal:  Soft, nondistended, nontender. No rebound or guarding. Normal bowel sounds. No appreciable masses or hepatomegaly. Rectal:  Not performed.  Anoscopy: Msk:  cast left arm Neurologic:  Alert and  oriented x4;  grossly normal neurologically.  Skin:   Dry and intact without significant lesions or rashes.  RELEVANT LABS AND IMAGING: CBC    Latest Ref Rng & Units 12/03/2023    2:53 PM 11/26/2023    3:46 PM 10/09/2023    3:52 AM  CBC  WBC 4.0 - 10.5 K/uL 5.9  6.7  9.2   Hemoglobin 12.0 - 15.0 g/dL 88.5  88.1  88.4   Hematocrit 36.0 - 46.0 % 36.6  36.8  37.1   Platelets 150 - 400 K/uL 274  312.0  269      CMP     Latest Ref Rng & Units 02/03/2024    8:00 AM 11/26/2023    3:46 PM 10/09/2023    3:52 AM  CMP  Glucose 70 - 99 mg/dL 814  838  878   BUN 8 - 23 mg/dL 12  15  14    Creatinine 0.44 - 1.00 mg/dL 8.90  8.85  8.70   Sodium 135 - 145 mmol/L 140  135  137   Potassium 3.5 - 5.1 mmol/L 4.4  4.5  3.9   Chloride 98 - 111 mmol/L 106  100  107   CO2 22 - 32 mmol/L 25  27  25    Calcium  8.9 - 10.3 mg/dL 9.5  89.8  9.6   Total Protein 6.0 - 8.3 g/dL  7.4    Total Bilirubin 0.2 - 1.2 mg/dL  0.3    Alkaline Phos 39 - 117 U/L  70    AST 0 - 37 U/L  15    ALT 0 - 35 U/L  16       Lab Results  Component Value Date   TSH 1.23 08/17/2023  04/2023 echo- Left ventricular ejection fraction, by estimation, is 60 to 65%.   02/22/21 EGD with Dr. Rollin - Benign- appearing esophageal stenosis. - Gastric mucosal variant. Biopsied. - Normal examined duodenum.  01/2021 colonoscopy with Dr. Rollin  - Ten 2 to 4 mm polyps in the descending colon and in the transverse colon, removed with a cold snare. Resected and retrieved. - Diverticulosis in the sigmoid colon.  Path: FINAL  MICROSCOPIC DIAGNOSIS:   A. STOMACH, BODY, BIOPSY:  - Gastric oxyntic mucosa with no specific histopathologic changes  - Warthin Starry stain is negative for Helicobacter pylori   B.  COLON, TRANSVERSE, POLYPECTOMY:  - Tubular adenoma(s) without high-grade dysplasia or malignancy  - Hyperplastic polyp(s)   C. COLON, DESCENDING, POLYPECTOMY:  - Tubular adenoma(s) without high-grade dysplasia or malignancy     Assessment: Encounter Diagnoses  Name Primary?   Gastroesophageal reflux disease, unspecified whether esophagitis present Yes   History of esophageal stricture    Opioid-induced constipation    Bloating    History of colonic polyps    69 year old female patient who presents with uncontrolled GERD and esophageal dysphagia.  Patient had EGD back in December 2022 with esophageal stenosis and reports it was dilated at that time.  He was recently placed on Dexilant  and Pepcid  with some relief in her GERD symptoms.  Now ahead and schedule upper GI endoscopy with dilatation in [facility] [place] to rule out Barrett's, esophageal rub or stricture. Patient also had colonoscopy December 2022 with 10 polyps, tubular adenomas with recommendations for recall colonoscopy in 3 years call ahead and schedule colonoscopy with 2-day prep in [facility] with Dr. San.  Patient also has history of opioid-induced constipation currently on hydrocodone  for left arm surgery.  I will give her samples of pro secretory agent Trulance 3 mg po daily.  I suspect this will also help with the bloating which is most likely due to constipation.   Plan: -Samples of Trulance 3 mg po daily -Continue Dexilant  60 mg p.o. daily Continue Pepcid  20 mg p.o. daily -Schedule for a colonoscopy with 2 day prep in [facility] with Dr. San. The risks and benefits of colonoscopy with possible polypectomy / biopsies were discussed and the patient agrees to proceed.  -Schedule EGD with possible dilatation in [facility] with Dr. San. The  risks and benefits of EGD with possible biopsies and esophageal dilation were discussed with the patient who agrees to proceed. - hold Mournjaro hold 1 week prior to procedure -Cardiac clearance for Plavix  -Hold Plavix  5 days before procedure - will instruct when and how to resume after procedure. Risks and benefits of procedure including bleeding, perforation, infection, missed lesions, medication reactions and possible hospitalization or surgery if complications occur explained. Additional rare but real risk of cardiovascular event such as heart attack or ischemia/infarct of other organs off Plavix  explained and need to seek urgent help if this occurs. Will communicate by phone or EMR with patient's prescribing provider that to confirm holding next is reasonable in this case.  - Pt has insulin  pump  Thank you for the courtesy of this consult. Please call me with any questions or concerns.   Tifanie Gardiner, FNP-C [facility] 03/03/2024, 4:57 PM  Cc: Antonio Cyndee Jamee JONELLE, DO  "

## 2024-03-03 NOTE — Telephone Encounter (Signed)
 [facility] Pre-operative Risk Assessment     Request for surgical clearance:     Endoscopy Procedure  What type of surgery is being performed?     Endoscopic   When is this surgery scheduled?     03/23/24  What type of clearance is required ?   Pharmacy  Are there any medications that need to be held prior to surgery and how long? Plavix  5 days  Practice name and name of physician performing surgery?      [facility]  What is your office phone and fax number?      Phone- [phone]  Fax- [phone]  Anesthesia type (None, local, MAC, general) ?       MAC   Please route your response to Karna Louder, RMA

## 2024-03-03 NOTE — Patient Instructions (Addendum)
 Constipation Samples Trulance take 1 tablet 30-45 mins before breakfast with full glass of water   Recommend High fiber diet   GERD  Recommend GERD diet Continue Dexilant  60 mg  Continue Pepcid  20 mg po daily   We have sent the following medications to your pharmacy for you to pick up at your convenience: SUPREP  You have been scheduled for an endoscopy and colonoscopy. Please follow the written instructions given to you at your visit today.  If you use inhalers (even only as needed), please bring them with you on the day of your procedure.  DO NOT TAKE 7 DAYS PRIOR TO TEST- Trulicity (dulaglutide) Ozempic, Wegovy (semaglutide) Mounjaro , Zepbound  (tirzepatide ) Bydureon Bcise (exanatide extended release)  DO NOT TAKE 1 DAY PRIOR TO YOUR TEST Rybelsus (semaglutide) Adlyxin (lixisenatide) Victoza (liraglutide) Byetta (exanatide) ___________________________________________________________________________ Due to recent changes in healthcare laws, you may see the results of your imaging and laboratory studies on MyChart before your provider has had a chance to review them.  We understand that in some cases there may be results that are confusing or concerning to you. Not all laboratory results come back in the same time frame and the provider may be waiting for multiple results in order to interpret others.  Please give us  48 hours in order for your provider to thoroughly review all the results before contacting the office for clarification of your results.   _______________________________________________________  If your blood pressure at your visit was 140/90 or greater, please contact your primary care physician to follow up on this.  _______________________________________________________  If you are age 89 or older, your body mass index should be between 23-30. Your Body mass index is 44.1 kg/m. If this is out of the aforementioned range listed, please consider follow up with your  Primary Care Provider.  If you are age 71 or younger, your body mass index should be between 19-25. Your Body mass index is 44.1 kg/m. If this is out of the aformentioned range listed, please consider follow up with your Primary Care Provider.   ________________________________________________________  The [facility] providers would like to encourage you to use MYCHART to communicate with providers for non-urgent requests or questions.  Due to long hold times on the telephone, sending your provider a message by [facility] may be a faster and more efficient way to get a response.  Please allow 48 business hours for a response.  Please remember that this is for non-urgent requests.  _______________________________________________________  [facility] is using a team-based approach to care.  Your team is made up of your doctor and two to three APPS. Our APPS (Nurse Practitioners and Physician Assistants) work with your physician to ensure care continuity for you. They are fully qualified to address your health concerns and develop a treatment plan. They communicate directly with your gastroenterologist to care for you. Seeing the Advanced Practice Practitioners on your physician's team can help you by facilitating care more promptly, often allowing for earlier appointments, access to diagnostic testing, procedures, and other specialty referrals.   Thank you for trusting me with your gastrointestinal care. Deanna May, FNP-C

## 2024-03-03 NOTE — Progress Notes (Signed)
 Agree with the assessment and plan as outlined by [facility], FNP-C.  Carlitos Bottino, DO, [facility]

## 2024-03-04 NOTE — Telephone Encounter (Signed)
"  ° °  Patient Name: Katrina Vega  DOB: 17-Oct-1955 MRN: [id number]  Primary Cardiologist: Alm Clay, MD  Chart reviewed as part of pre-operative protocol coverage.  Patient was previously cleared by Dr. Court to hold Plavix .  Patient may hold Plavix  for 5 days prior to procedure.  Please resume Plavix  as soon as possible postprocedure, the discretion of the surgeon.  I will route this recommendation to the requesting party via Epic fax function and remove from pre-op pool.  Please call with questions.  Damien JAYSON Braver, NP 03/04/2024, 7:54 AM  "

## 2024-03-08 ENCOUNTER — Ambulatory Visit

## 2024-03-08 DIAGNOSIS — J4550 Severe persistent asthma, uncomplicated: Secondary | ICD-10-CM | POA: Diagnosis not present

## 2024-03-09 ENCOUNTER — Encounter: Payer: Self-pay | Admitting: Cardiovascular Disease

## 2024-03-09 ENCOUNTER — Ambulatory Visit: Admitting: Cardiovascular Disease

## 2024-03-09 VITALS — BP 130/72 | HR 92 | Ht 65.0 in | Wt 261.2 lb

## 2024-03-09 DIAGNOSIS — R011 Cardiac murmur, unspecified: Secondary | ICD-10-CM

## 2024-03-09 DIAGNOSIS — I739 Peripheral vascular disease, unspecified: Secondary | ICD-10-CM

## 2024-03-09 DIAGNOSIS — I25118 Atherosclerotic heart disease of native coronary artery with other forms of angina pectoris: Secondary | ICD-10-CM | POA: Diagnosis not present

## 2024-03-09 MED ORDER — LISINOPRIL 20 MG PO TABS: 20.0000 mg | ORAL_TABLET | Freq: Every day | ORAL | 3 refills | Status: AC

## 2024-03-09 NOTE — Patient Instructions (Signed)
 Medication Instructions:  Your physician recommends that you continue on your current medications as directed. Please refer to the Current Medication list given to you today.  *If you need a refill on your cardiac medications before your next appointment, please call your pharmacy*  Testing/Procedures: Your physician has requested that you have a lower extremity arterial duplex. During this test, ultrasound is used to evaluate arterial blood flow in the legs. Allow one hour for this exam. There are no restrictions or special instructions. This will take place at [address], 4th floor  **To do in August**  Please note: We ask at that you not bring children with you during ultrasound (echo/ vascular) testing. Due to room size and safety concerns, children are not allowed in the ultrasound rooms during exams. Our front office staff cannot provide observation of children in our lobby area while testing is being conducted. An adult accompanying a patient to their appointment will only be allowed in the ultrasound room at the discretion of the ultrasound technician under special circumstances. We apologize for any inconvenience.  Your physician has requested that you have an ankle brachial index (ABI). During this test an ultrasound and blood pressure cuff are used to evaluate the arteries that supply the arms and legs with blood. Allow thirty minutes for this exam. There are no restrictions or special instructions. This will take place at [address] 4th floor  **To do in August**   Please note: We ask at that you not bring children with you during ultrasound (echo/ vascular) testing. Due to room size and safety concerns, children are not allowed in the ultrasound rooms during exams. Our front office staff cannot provide observation of children in our lobby area while testing is being conducted. An adult accompanying a patient to their appointment will only be allowed in the ultrasound room at  the discretion of the ultrasound technician under special circumstances. We apologize for any inconvenience.   Follow-Up: At [facility], you and your health needs are our priority.  As part of our continuing mission to provide you with exceptional heart care, our providers are all part of one team.  This team includes your primary Cardiologist (physician) and Advanced Practice Providers or APPs (Physician Assistants and Nurse Practitioners) who all work together to provide you with the care you need, when you need it.  Your next appointment:   12 month(s)  Provider:   Dorn Lesches, MD  (PV only)   We recommend signing up for the patient portal called MyChart.  Sign up information is provided on this After Visit Summary.  MyChart is used to connect with patients for Virtual Visits (Telemedicine).  Patients are able to view lab/test results, encounter notes, upcoming appointments, etc.  Non-urgent messages can be sent to your provider as well.   To learn more about what you can do with MyChart, go to [email or web address].   Other Instructions

## 2024-03-09 NOTE — Assessment & Plan Note (Signed)
 History of PAD status post bilateral SFA stents.  Because of progressive claudication left greater than right and worsening Doppler studies I performed angiography on her 10/08/2023.  She had 95% in-stent restenosis within the mid left SFA stent and 75% within the right.  I performed DCB with a 6 mm x 60 mm long drug-coated balloon with an excellent result.  Her postprocedure Doppler studies performed 10/14/2023 revealed an increase in the left ABI from 0.87-1.05 with normal velocities.  Her claudication has resolved.  She really has minimal claudication on the right.  Will continue to follow her noninvasively.

## 2024-03-09 NOTE — Progress Notes (Signed)
 "     03/09/2024 Katrina Vega   07/03/1955  [id number]  Primary Physician Antonio Cyndee Jamee JONELLE, DO Primary Cardiologist: Dorn JINNY Lesches MD [place], [place], [place]  HPI:  Katrina Vega is a 69 y.o.   69 year old moderately overweight single African-American female mother of 5, grandmother of 9 grandchildren referred by Dr. Anner for evaluation of PAD.  She is retired from pharmacist, community at [employer] .  I last saw her in the office 11/03/2023.  Her risk factors include remote tobacco abuse, treated hypertension, diabetes and hyperlipidemia.  She has never had a stroke but did have coronary stenting in [place] in 2008 and apparently had a heart heart attack in 2015.  She had stenting of her right leg in 2008 and I think her left leg in 2019.  She does complain of dyspnea.  Over the last 3 months she has noticed increasing lower extremity claudication left greater than right.  She had Dopplers that revealed stable ABIs compared to 3 years ago with moderate disease in her proximal right SFA and a high-grade lesion in her left popliteal artery.   Since I saw her 2 months ago I did begin her on Pletal  which really has not changed her symptoms significantly.  She still has left greater than right lower extremity lifestyle-limiting claudication and wishes to proceed with angiography potential endovascular therapy.  Of note, she does have a contrast allergy .   I performed peripheral angiography on her 10/08/2023 revealing patent bilateral SFA stents with moderately severe in-stent restenosis left greater than right.  I performed DCB of the left in-stent restenosis with an excellent result.  Follow-up Dopplers improved and her claudication has resolved.  She still has mild right calf claudication.  Since I saw her in the office 4 months ago she continues to do well.  She no longer has left lower extremity claudication.  She has minimal claudication on the right side which is not lifestyle  limiting.   Active Medications[1]   Allergies[2]  Social History   Socioeconomic History   Marital status: Single    Spouse name: Not on file   Number of children: 5   Years of education: Not on file   Highest education level: Associate degree: academic program  Occupational History   Not on file  Tobacco Use   Smoking status: Former    Current packs/day: 0.00    Average packs/day: 0.5 packs/day for 15.0 years (7.5 ttl pk-yrs)    Types: Cigarettes    Start date: 10/13/1980    Quit date: 10/14/1995    Years since quitting: 28.4    Passive exposure: Current   Smokeless tobacco: Never  Vaping Use   Vaping status: Never Used  Substance and Sexual Activity   Alcohol use: Not Currently   Drug use: Never   Sexual activity: Yes    Birth control/protection: Surgical    Comment: hyst  Other Topics Concern   Not on file  Social History Narrative   Not on file   Social Drivers of Health   Tobacco Use: Medium Risk (03/09/2024)   Patient History    Smoking Tobacco Use: Former    Smokeless Tobacco Use: Never    Passive Exposure: Current  Physicist, Medical Strain: Low Risk (11/04/2023)   Overall Financial Resource Strain (CARDIA)    Difficulty of Paying Living Expenses: Not very hard  Food Insecurity: No Food Insecurity (11/04/2023)   Epic    Worried About Radiation Protection Practitioner of [employer]  in the Last Year: Never true    Ran Out of Food in the Last Year: Never true  Transportation Needs: No Transportation Needs (11/04/2023)   Epic    Lack of Transportation (Medical): No    Lack of Transportation (Non-Medical): No  Physical Activity: Insufficiently Active (11/04/2023)   Exercise Vital Sign    Days of Exercise per Week: 5 days    Minutes of Exercise per Session: 20 min  Stress: No Stress Concern Present (11/04/2023)   [employer] of Occupational Health - Occupational Stress Questionnaire    Feeling of Stress: Only a little  Social Connections: Moderately Integrated (11/04/2023)    Social Connection and Isolation Panel    Frequency of Communication with Friends and Family: Three times a week    Frequency of Social Gatherings with Friends and Family: Once a week    Attends Religious Services: More than 4 times per year    Active Member of Clubs or Organizations: Yes    Attends Banker Meetings: More than 4 times per year    Marital Status: Never married  Intimate Partner Violence: Not At Risk (11/04/2023)   Epic    Fear of Current or Ex-Partner: No    Emotionally Abused: No    Physically Abused: No    Sexually Abused: No  Depression (PHQ2-9): Low Risk (12/22/2023)   Depression (PHQ2-9)    PHQ-2 Score: 0  Alcohol Screen: Low Risk (11/04/2023)   Alcohol Screen    Last Alcohol Screening Score (AUDIT): 0  Housing: Low Risk (11/04/2023)   Epic    Unable to Pay for Housing in the Last Year: No    Number of Times Moved in the Last Year: 0    Homeless in the Last Year: No  Utilities: Not At Risk (11/04/2023)   Epic    Threatened with loss of utilities: No  Health Literacy: Adequate Health Literacy (11/04/2023)   B1300 Health Literacy    Frequency of need for help with medical instructions: Never     Review of Systems: General: negative for chills, fever, night sweats or weight changes.  Cardiovascular: negative for chest pain, dyspnea on exertion, edema, orthopnea, palpitations, paroxysmal nocturnal dyspnea or shortness of breath Dermatological: negative for rash Respiratory: negative for cough or wheezing Urologic: negative for hematuria Abdominal: negative for nausea, vomiting, diarrhea, bright red blood per rectum, melena, or hematemesis Neurologic: negative for visual changes, syncope, or dizziness All other systems reviewed and are otherwise negative except as noted above.    Blood pressure 130/72, pulse 92, height 5' 5 (1.651 m), weight 261 lb 3.2 oz (118.5 kg), SpO2 95%.  General appearance: alert and no distress Neck: no adenopathy, no  carotid bruit, no JVD, supple, symmetrical, trachea midline, and thyroid  not enlarged, symmetric, no tenderness/mass/nodules Lungs: clear to auscultation bilaterally Heart: regular rate and rhythm, S1, S2 normal, no murmur, click, rub or gallop Extremities: extremities normal, atraumatic, no cyanosis or edema Pulses: 2+ and symmetric Skin: Skin color, texture, turgor normal. No rashes or lesions Neurologic: Grossly normal  EKG EKG Interpretation Date/Time:  Wednesday March 09 2024 13:34:37 EST Ventricular Rate:  92 PR Interval:  168 QRS Duration:  78 QT Interval:  342 QTC Calculation: 422 R Axis:   48  Text Interpretation: Normal sinus rhythm Nonspecific ST abnormality When compared with ECG of 24-Jun-2023 14:08, No significant change was found Confirmed by Court Carrier [phone]) on 03/09/2024 1:36:11 PM    ASSESSMENT AND PLAN:   Peripheral arterial disease History  of PAD status post bilateral SFA stents.  Because of progressive claudication left greater than right and worsening Doppler studies I performed angiography on her 10/08/2023.  She had 95% in-stent restenosis within the mid left SFA stent and 75% within the right.  I performed DCB with a 6 mm x 60 mm long drug-coated balloon with an excellent result.  Her postprocedure Doppler studies performed 10/14/2023 revealed an increase in the left ABI from 0.87-1.05 with normal velocities.  Her claudication has resolved.  She really has minimal claudication on the right.  Will continue to follow her noninvasively.     Dorn DOROTHA Lesches MD FACP,FACC,FAHA, FSCAI 03/09/2024 1:55 PM     [1]  Current Meds  Medication Sig   benralizumab  (FASENRA  PEN) 30 MG/ML prefilled autoinjector Inject 1 mL (30 mg total) into the skin every 8 (eight) weeks.   BREZTRI  AEROSPHERE 160-9-4.8 MCG/ACT AERO inhaler INHALE 2 INHALATIONS BY MOUTH  TWICE DAILY TO PREVENT COUGH OR  WHEEZE. RINSE, GARGLE, AND SPIT  AFTER USE   budesonide  (PULMICORT ) 0.5  MG/2ML nebulizer solution Take 2 mLs (0.5 mg total) by nebulization in the morning and at bedtime. During respiratory infections for 1-2 weeks at a time.   bumetanide  (BUMEX ) 1 MG tablet Take 1 tablet (1 mg total) by mouth daily.   Cholecalciferol 50 MCG (2000 UT) TABS Take 2,000 Units by mouth daily.   clopidogrel  (PLAVIX ) 75 MG tablet TAKE 1 TABLET BY MOUTH DAILY   Continuous Blood Gluc Transmit (DEXCOM G6 TRANSMITTER) MISC    Continuous Glucose Sensor (DEXCOM G6 SENSOR) MISC 1 Device by Does not apply route continuous.   Continuous Glucose Sensor (DEXCOM G7 SENSOR) MISC 1 Device by Does not apply route continuous.   dexlansoprazole  (DEXILANT ) 60 MG capsule TAKE 1 CAPSULE(60 MG) BY MOUTH DAILY   diazepam  (VALIUM ) 5 MG tablet Take 1 tablet (5 mg total) by mouth every 12 (twelve) hours as needed for anxiety.   diphenhydrAMINE (BENADRYL) 50 MG tablet Take 50 mg by mouth daily as needed for itching or allergies.   EPINEPHrine  (EPIPEN  2-PAK) 0.3 mg/0.3 mL IJ SOAJ injection Use as directed for severe allergic reactions   famotidine  (PEPCID ) 20 MG tablet Take 1 tablet (20 mg total) by mouth 2 (two) times daily.   gabapentin  (NEURONTIN ) 300 MG capsule TAKE 1 CAPSULE BY MOUTH 3 TIMES  DAILY   Glucagon  (BAQSIMI  ONE PACK) 3 MG/DOSE POWD Place 1 Device into the nose as needed (Low blood sugar with impaired consciousness).   glucose 4 GM chewable tablet Chew 1 tablet by mouth once as needed for low blood sugar.   HUMALOG  100 UNIT/ML injection INJECT SUBCUTANEOUSLY VIA  INSULIN  PUMP AS DIRECTED .  MAXIMUM DAILY DOSE: 150 UNITS   HUMALOG  KWIKPEN 100 UNIT/ML KwikPen INJECT SUBCUTANEOUSLY AS  DIRECTED BASED ON CARB AND  CORRECTION RATIO, MAX DOSE 80  UNITS DAILY   HYDROcodone -acetaminophen  (NORCO/VICODIN) 5-325 MG tablet Take 1 tablet by mouth every 6 (six) hours as needed for moderate pain (pain score 4-6).   Insulin  Disposable Pump (OMNIPOD 5 DEXG7G6 PODS GEN 5) MISC 1 Device by Does not apply route every other  day. Change pod every 1.5 days   Insulin  Pen Needle (EMBECTA PEN NEEDLE NANO 2 GEN) 32G X 4 MM MISC USE 1 NEEDLE TO INJECT  SUBCUTANEOUSLY IN THE MORNING AT NOON IN THE EVENING AND AT  BEDTIME   ipratropium (ATROVENT ) 0.03 % nasal spray Place 1-2 sprays into both nostrils 2 (two) times daily as needed (  nasal drainage).   isosorbide  mononitrate (IMDUR ) 60 MG 24 hr tablet TAKE 1 TABLET BY MOUTH DAILY   LANTUS  SOLOSTAR 100 UNIT/ML Solostar Pen INJECT SUBCUTANEOUSLY 55 UNITS  DAILY   levalbuterol  (XOPENEX  HFA) 45 MCG/ACT inhaler USE 2 INHALATIONS BY MOUTH EVERY 4 TO 6 HOURS AS NEEDED FOR  SHORTNESS OF BREATH , COUGH,  WHEEZE AND TIGHTNESS IN CHEST   levalbuterol  (XOPENEX ) 0.63 MG/3ML nebulizer solution Take 3 mLs (0.63 mg total) by nebulization every 4 (four) hours as needed for wheezing or shortness of breath (coughing fits).   lisinopril  (ZESTRIL ) 20 MG tablet Take 20 mg by mouth daily.   meclizine  (ANTIVERT ) 25 MG tablet Take 1 tablet (25 mg total) by mouth 3 (three) times daily as needed for dizziness.   methocarbamol  (ROBAXIN ) 500 MG tablet Take 1 tablet (500 mg total) by mouth every 6 (six) hours as needed for muscle spasms.   metoprolol  tartrate (LOPRESSOR ) 50 MG tablet TAKE 1 TABLET BY MOUTH EVERY 12  HOURS   montelukast  (SINGULAIR ) 10 MG tablet Take 1 tablet (10 mg total) by mouth at bedtime.   Na Sulfate-K Sulfate-Mg Sulfate concentrate (SUPREP) 17.5-3.13-1.6 GM/177ML SOLN    nitroGLYCERIN  (NITROSTAT ) 0.4 MG SL tablet Place 1 tablet (0.4 mg total) under the tongue every 5 (five) minutes as needed for chest pain.   Respiratory Therapy Supplies (CARETOUCH 2 CPAP HOSE HANGER) MISC by Does not apply route.   rosuvastatin  (CRESTOR ) 40 MG tablet TAKE 1 TABLET BY MOUTH AT  BEDTIME   tirzepatide  (MOUNJARO ) 12.5 MG/0.5ML Pen Inject 12.5 mg into the skin once a week.   Current Facility-Administered Medications for the 03/09/24 encounter (Office Visit) with Court Dorn PARAS, MD  Medication    Benralizumab  SOSY 30 mg  [2]  Allergies Allergen Reactions   Contrast Media [Iodinated Contrast Media] Shortness Of Breath and Other (See Comments)    sob, chest tight 2024, small amount in joint injection tolerated well   Morphine Itching   Ioversol    Oxycodone  Itching   Tramadol  Itching and Nausea Only   "
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
## 2024-03-15 ENCOUNTER — Ambulatory Visit: Admitting: Orthopedic Surgery

## 2024-03-15 ENCOUNTER — Encounter: Admitting: Rehabilitative and Restorative Service Providers"

## 2024-03-15 ENCOUNTER — Ambulatory Visit: Admitting: Rehabilitative and Restorative Service Providers"

## 2024-03-15 ENCOUNTER — Encounter: Payer: Self-pay | Admitting: "Endocrinology

## 2024-03-15 ENCOUNTER — Encounter: Payer: Self-pay | Admitting: Rehabilitative and Restorative Service Providers"

## 2024-03-15 ENCOUNTER — Ambulatory Visit

## 2024-03-15 DIAGNOSIS — R278 Other lack of coordination: Secondary | ICD-10-CM

## 2024-03-15 DIAGNOSIS — R600 Localized edema: Secondary | ICD-10-CM

## 2024-03-15 DIAGNOSIS — M25532 Pain in left wrist: Secondary | ICD-10-CM

## 2024-03-15 DIAGNOSIS — M25642 Stiffness of left hand, not elsewhere classified: Secondary | ICD-10-CM

## 2024-03-15 DIAGNOSIS — M6281 Muscle weakness (generalized): Secondary | ICD-10-CM | POA: Diagnosis not present

## 2024-03-15 DIAGNOSIS — E1165 Type 2 diabetes mellitus with hyperglycemia: Secondary | ICD-10-CM

## 2024-03-15 DIAGNOSIS — M79642 Pain in left hand: Secondary | ICD-10-CM

## 2024-03-15 NOTE — Progress Notes (Signed)
" ° °  Katrina Vega - 70 y.o. female MRN [id number]  Date of birth: [birth year]  Office Visit Note: Visit Date: 03/15/2024 PCP: Antonio Cyndee Jamee JONELLE, DO Referred by: Antonio Cyndee Jamee JONELLE, DO  Subjective:  HPI: Katrina Vega is a 69 y.o. female who presents today for follow up 6 weeks status post left open carpal tunnel release with hypothenar fat flap and left thumb CMC arthroplasty. Patient did sustain intraoperative fracture to the index metacarpal during [facility] arthroplasty at the time of anchor placement, FiberWire cerclage fixation was performed at that time.  Pertinent ROS were reviewed with the patient and found to be negative unless otherwise specified above in HPI.   Assessment & Plan: Visit Diagnoses:  1. Pain in left wrist     Plan: She is doing well overall.  Cast was removed today.  Swelling is improved.  X-rays show stable appearance of the index metacarpal fracture with appropriate interval healing.  She will be seen by occupational therapy today for fabrication of an orthosis and begin range of motion exercises.  Follow-up myself in approximately 4 weeks  Follow-up: No follow-ups on file.   Meds & Orders: No orders of the defined types were placed in this encounter.   Orders Placed This Encounter  Procedures   XR Hand Complete Left     Procedures: No procedures performed       Objective:   Vital Signs: There were no vitals taken for this visit.  Ortho Exam Left wrist/hand: - Well-healed incision at the glabrous/nonglabrous juncture over the [facility] region of the thumb, well-healed palmar incision, well- healed incision over the dorsal index metacarpal - Diffuse swelling is seen throughout the hand, sensation intact distally, hand remains warm well-perfused  Imaging: XR Hand Complete Left Result Date: 03/15/2024 X-rays of the left hand demonstrate stable appearance of the thumb metacarpal status post trapeziectomy. Index metacarpal fracture once again visualized with  ongoing bony consolidation, no interval displacement.     Annalysia Willenbring Afton Alderton, M.D.  [facility]  "

## 2024-03-16 ENCOUNTER — Other Ambulatory Visit: Payer: Self-pay | Admitting: "Endocrinology

## 2024-03-16 ENCOUNTER — Encounter: Payer: Self-pay | Admitting: Gastroenterology

## 2024-03-16 DIAGNOSIS — E1165 Type 2 diabetes mellitus with hyperglycemia: Secondary | ICD-10-CM

## 2024-03-16 MED ORDER — TIRZEPATIDE 12.5 MG/0.5ML ~~LOC~~ SOAJ: 12.5000 mg | SUBCUTANEOUS | 0 refills | Status: AC

## 2024-03-22 ENCOUNTER — Telehealth: Payer: Self-pay | Admitting: Gastroenterology

## 2024-03-22 NOTE — Telephone Encounter (Signed)
 Good morning Dr. San,   Patient wished to reschedule tomorrow's procedures due to care partner not being able to make it out onto the roads due to snow and ice. Patient has been rescheduled to 3/3.   Thank you.

## 2024-03-23 ENCOUNTER — Encounter: Admitting: Gastroenterology

## 2024-03-23 ENCOUNTER — Encounter: Admitting: Rehabilitative and Restorative Service Providers"

## 2024-03-29 ENCOUNTER — Encounter: Admitting: Rehabilitative and Restorative Service Providers"

## 2024-03-30 ENCOUNTER — Inpatient Hospital Stay: Admitting: Family

## 2024-03-30 ENCOUNTER — Inpatient Hospital Stay
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
## 2024-03-31 ENCOUNTER — Encounter: Payer: Self-pay | Admitting: Rehabilitative and Restorative Service Providers"

## 2024-03-31 ENCOUNTER — Ambulatory Visit: Admitting: Rehabilitative and Restorative Service Providers"

## 2024-03-31 ENCOUNTER — Telehealth: Payer: Self-pay | Admitting: Family

## 2024-03-31 DIAGNOSIS — M79642 Pain in left hand: Secondary | ICD-10-CM

## 2024-03-31 DIAGNOSIS — R278 Other lack of coordination: Secondary | ICD-10-CM

## 2024-03-31 DIAGNOSIS — M25642 Stiffness of left hand, not elsewhere classified: Secondary | ICD-10-CM

## 2024-03-31 DIAGNOSIS — R600 Localized edema: Secondary | ICD-10-CM

## 2024-03-31 DIAGNOSIS — M6281 Muscle weakness (generalized): Secondary | ICD-10-CM
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
## 2024-03-31 NOTE — Telephone Encounter (Signed)
 Returning pt's voicemail about scheduling an appt. LVM to return call for scheduling.

## 2024-04-04 ENCOUNTER — Encounter: Admitting: Rehabilitative and Restorative Service Providers"

## 2024-04-06 ENCOUNTER — Encounter: Admitting: Rehabilitative and Restorative Service Providers"

## 2024-04-11 ENCOUNTER — Encounter: Admitting: Rehabilitative and Restorative Service Providers"

## 2024-04-12 ENCOUNTER — Encounter: Admitting: Orthopedic Surgery

## 2024-04-13 ENCOUNTER — Encounter: Admitting: Rehabilitative and Restorative Service Providers"

## 2024-04-18 ENCOUNTER — Encounter: Admitting: Rehabilitative and Restorative Service Providers"

## 2024-04-20 ENCOUNTER — Encounter: Admitting: Rehabilitative and Restorative Service Providers"

## 2024-04-21 ENCOUNTER — Ambulatory Visit: Admitting: "Endocrinology

## 2024-04-26 ENCOUNTER — Encounter: Admitting: Gastroenterology

## 2024-05-03 ENCOUNTER — Ambulatory Visit

## 2024-05-30 ENCOUNTER — Ambulatory Visit: Admitting: Allergy

## 2024-11-10 ENCOUNTER — Ambulatory Visit
